# Patient Record
Sex: Male | Born: 1960 | Race: White | Hispanic: No | Marital: Single | Smoking: Former smoker
Health system: Southern US, Community
[De-identification: ages and names within clinical notes are randomized; demographics above are authoritative.]

## PROBLEM LIST (undated history)

## (undated) DIAGNOSIS — R0600 Dyspnea, unspecified: Secondary | ICD-10-CM

## (undated) DIAGNOSIS — F329 Major depressive disorder, single episode, unspecified: Secondary | ICD-10-CM

## (undated) DIAGNOSIS — H8110 Benign paroxysmal vertigo, unspecified ear: Secondary | ICD-10-CM

## (undated) DIAGNOSIS — I2510 Atherosclerotic heart disease of native coronary artery without angina pectoris: Secondary | ICD-10-CM

## (undated) DIAGNOSIS — I700 Atherosclerosis of aorta: Secondary | ICD-10-CM

## (undated) DIAGNOSIS — D3502 Benign neoplasm of left adrenal gland: Secondary | ICD-10-CM

## (undated) DIAGNOSIS — E109 Type 1 diabetes mellitus without complications: Secondary | ICD-10-CM

## (undated) DIAGNOSIS — E782 Mixed hyperlipidemia: Secondary | ICD-10-CM

## (undated) DIAGNOSIS — K824 Cholesterolosis of gallbladder: Secondary | ICD-10-CM

## (undated) DIAGNOSIS — J309 Allergic rhinitis, unspecified: Secondary | ICD-10-CM

## (undated) DIAGNOSIS — F411 Generalized anxiety disorder: Secondary | ICD-10-CM

## (undated) DIAGNOSIS — E1039 Type 1 diabetes mellitus with other diabetic ophthalmic complication: Secondary | ICD-10-CM

## (undated) DIAGNOSIS — F3289 Other specified depressive episodes: Secondary | ICD-10-CM

## (undated) DIAGNOSIS — K30 Functional dyspepsia: Secondary | ICD-10-CM

## (undated) DIAGNOSIS — J439 Emphysema, unspecified: Secondary | ICD-10-CM

## (undated) DIAGNOSIS — N1370 Vesicoureteral-reflux, unspecified: Secondary | ICD-10-CM

## (undated) DIAGNOSIS — Z87442 Personal history of urinary calculi: Secondary | ICD-10-CM

## (undated) DIAGNOSIS — D1803 Hemangioma of intra-abdominal structures: Secondary | ICD-10-CM

## (undated) DIAGNOSIS — N529 Male erectile dysfunction, unspecified: Secondary | ICD-10-CM

## (undated) DIAGNOSIS — C61 Malignant neoplasm of prostate: Secondary | ICD-10-CM

## (undated) DIAGNOSIS — E11329 Type 2 diabetes mellitus with mild nonproliferative diabetic retinopathy without macular edema: Secondary | ICD-10-CM

## (undated) HISTORY — DX: Generalized anxiety disorder: F41.1

## (undated) HISTORY — DX: Allergic rhinitis, unspecified: J30.9

## (undated) HISTORY — DX: Other specified depressive episodes: F32.89

## (undated) HISTORY — PX: TONSILLECTOMY: SUR1361

## (undated) HISTORY — DX: Major depressive disorder, single episode, unspecified: F32.9

## (undated) HISTORY — DX: Type 1 diabetes mellitus with other diabetic ophthalmic complication: E10.39

## (undated) HISTORY — DX: Benign paroxysmal vertigo, unspecified ear: H81.10

## (undated) HISTORY — DX: Mixed hyperlipidemia: E78.2

## (undated) HISTORY — PX: PROSTATE BIOPSY: SHX241

## (undated) HISTORY — PX: WISDOM TOOTH EXTRACTION: SHX21

## (undated) HISTORY — DX: Vesicoureteral-reflux, unspecified: N13.70

## (undated) HISTORY — DX: Type 1 diabetes mellitus without complications: E10.9

## (undated) HISTORY — DX: Type 2 diabetes mellitus with mild nonproliferative diabetic retinopathy without macular edema: E11.329

## (undated) HISTORY — PX: OTHER SURGICAL HISTORY: SHX169

---

## 1997-09-10 ENCOUNTER — Emergency Department (HOSPITAL_COMMUNITY): Admission: EM | Admit: 1997-09-10 | Discharge: 1997-09-10 | Payer: Self-pay | Admitting: Emergency Medicine

## 1999-11-27 ENCOUNTER — Emergency Department (HOSPITAL_COMMUNITY): Admission: EM | Admit: 1999-11-27 | Discharge: 1999-11-27 | Payer: Self-pay | Admitting: *Deleted

## 1999-11-27 ENCOUNTER — Encounter: Payer: Self-pay | Admitting: Emergency Medicine

## 2003-03-19 ENCOUNTER — Ambulatory Visit (HOSPITAL_COMMUNITY): Admission: RE | Admit: 2003-03-19 | Discharge: 2003-03-19 | Payer: Self-pay | Admitting: Orthopedic Surgery

## 2003-03-19 ENCOUNTER — Ambulatory Visit (HOSPITAL_BASED_OUTPATIENT_CLINIC_OR_DEPARTMENT_OTHER): Admission: RE | Admit: 2003-03-19 | Discharge: 2003-03-19 | Payer: Self-pay | Admitting: Orthopedic Surgery

## 2011-04-11 DIAGNOSIS — H3530 Unspecified macular degeneration: Secondary | ICD-10-CM | POA: Insufficient documentation

## 2011-10-10 DIAGNOSIS — E103293 Type 1 diabetes mellitus with mild nonproliferative diabetic retinopathy without macular edema, bilateral: Secondary | ICD-10-CM | POA: Insufficient documentation

## 2012-05-03 DIAGNOSIS — D3130 Benign neoplasm of unspecified choroid: Secondary | ICD-10-CM | POA: Insufficient documentation

## 2013-10-14 ENCOUNTER — Ambulatory Visit
Admission: RE | Admit: 2013-10-14 | Discharge: 2013-10-14 | Disposition: A | Discharge status: Home / Self Care | Payer: BC Managed Care – PPO | Source: Ambulatory Visit | Attending: Family Medicine | Admitting: Family Medicine

## 2013-10-14 ENCOUNTER — Other Ambulatory Visit: Payer: Self-pay | Admitting: Family Medicine

## 2013-10-14 DIAGNOSIS — E119 Type 2 diabetes mellitus without complications: Secondary | ICD-10-CM

## 2013-10-14 DIAGNOSIS — E785 Hyperlipidemia, unspecified: Secondary | ICD-10-CM

## 2013-11-17 ENCOUNTER — Encounter: Payer: Self-pay | Admitting: *Deleted

## 2013-11-17 ENCOUNTER — Ambulatory Visit (INDEPENDENT_AMBULATORY_CARE_PROVIDER_SITE_OTHER): Payer: BC Managed Care – PPO | Admitting: Cardiology

## 2013-11-17 ENCOUNTER — Encounter: Payer: Self-pay | Admitting: Cardiology

## 2013-11-17 VITALS — BP 120/60 | HR 63 | Ht 71.0 in | Wt 171.0 lb

## 2013-11-17 DIAGNOSIS — R0609 Other forms of dyspnea: Secondary | ICD-10-CM

## 2013-11-17 DIAGNOSIS — I10 Essential (primary) hypertension: Secondary | ICD-10-CM

## 2013-11-17 DIAGNOSIS — R0989 Other specified symptoms and signs involving the circulatory and respiratory systems: Secondary | ICD-10-CM

## 2013-11-17 DIAGNOSIS — E119 Type 2 diabetes mellitus without complications: Secondary | ICD-10-CM

## 2013-11-17 DIAGNOSIS — R0600 Dyspnea, unspecified: Secondary | ICD-10-CM

## 2013-11-17 DIAGNOSIS — I25810 Atherosclerosis of coronary artery bypass graft(s) without angina pectoris: Secondary | ICD-10-CM

## 2013-11-17 DIAGNOSIS — Z794 Long term (current) use of insulin: Secondary | ICD-10-CM

## 2013-11-17 DIAGNOSIS — IMO0001 Reserved for inherently not codable concepts without codable children: Secondary | ICD-10-CM

## 2013-11-17 DIAGNOSIS — E785 Hyperlipidemia, unspecified: Secondary | ICD-10-CM

## 2013-11-17 NOTE — Progress Notes (Signed)
Patient ID: DAMMON MAKAREWICZ, male   DOB: 04-Dec-1960, 53 y.o.   MRN: [id number]     Patient Name: Jose Harvey Date of Encounter: 11/17/2013  Primary Care Provider:  Antony Blackbird, MD Primary Cardiologist:  Dorothy Spark   Problem List   Past Medical History  Diagnosis Date  . Type I (juvenile type) diabetes mellitus without mention of complication, not stated as uncontrolled   . Mixed hyperlipidemia   . Depressive disorder, not elsewhere classified   . BPV (benign positional vertigo)   . GERD (gastroesophageal reflux disease)   . Vesicoureteral reflux   . Anxiety state, unspecified   . Allergic rhinitis, cause unspecified   . 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  Allergies  Allergies  Allergen Reactions  . Other Other (See Comments)    Any antihistamines Causes shaking  . Zocor [Simvastatin] Other (See Comments)    Muscle aches     HPI  A pleasant 53 year old gentleman with prior medical history of insulin-dependent diabetes mellitus diagnosed at age 68 currently on insulin pump, hypertension, hyperlipidemia who is coming with concern of exertion all dyspnea that he has noticed in the last year. On one occasion he was helping his son in law pulling at the year and he became significantly short of breath that persisted for a while. He has also noticed that activities that used to be easy making now short of breath. He bought a treadmill and starting February he has been exercising almost daily with some improvement in dyspnea on exertion. He denies any chest pain, jaw pain back pain or arm pain. He had 2 occasions when he woke up in the middle of the night feeling short of breath and sitting up would help. He denies lower extremity edema and states that his left legs gets tired after walking longer distances. The  patient quit smoking 5 years ago. He denies any palpitations or syncope. He has significant family history of premature coronary artery disease, his father had a coronary artery bypass graft surgery and passed away from a myocardial infarction at age of 55.   Home Medications  Prior to Admission medications   Medication Sig Start Date End Date Taking? Authorizing Provider  aspirin 81 MG tablet Take 81 mg by mouth daily.   Yes Historical Provider, MD  glucagon (GLUCAGON EMERGENCY) 1 MG injection Inject 1 mg into the vein once as needed.   Yes Historical Provider, MD  insulin aspart (NOVOLOG) 100 UNIT/ML injection Inject into the skin 3 (three) times daily before meals. Sliding scale   Yes Historical Provider, MD  KRILL OIL PO Take by mouth daily.   Yes Historical Provider, MD  lisinopril (PRINIVIL,ZESTRIL) 5 MG tablet Take 1 tablet by mouth daily. 09/09/13  Yes Historical Provider, MD  pantoprazole (PROTONIX) 40 MG tablet Take 1 tablet by mouth daily. 10/13/13  Yes Historical Provider, MD  sertraline (ZOLOFT) 50 MG tablet Take 50 mg by mouth daily.   Yes Historical Provider, MD    Family History  Family History  Problem Relation Age of Onset  . Diabetes type I Father   . Heart attack Father   . Hypertension Father   . Emphysema Father   . CAD Father   . Emphysema Paternal Grandfather   . Diabetes type I Paternal Grandfather   . Colon cancer Maternal Grandfather   .  Kidney disease Neg Hx   . Liver disease Neg Hx     Social History  History   Social History  . Marital Status: Married    Spouse Name: N/A    Number of Children: N/A  . Years of Education: N/A   Occupational History  . Not on file.   Social History Main Topics  . Smoking status: Former Research scientist (life sciences)  . Smokeless tobacco: Never Used     Comment: quit 2010  . Alcohol Use: 3.6 oz/week    6 Cans of beer per week     Comment: occasional 6pk per week  . Drug Use: Not on file  . Sexual Activity: Not on file   Other  Topics Concern  . Not on file   Social History Narrative  . No narrative on file     Review of Systems, as per HPI, otherwise negative General:  No chills, fever, night sweats or weight changes.  Cardiovascular:  No chest pain, dyspnea on exertion, edema, orthopnea, palpitations, paroxysmal nocturnal dyspnea. Dermatological: No rash, lesions/masses Respiratory: No cough, dyspnea Urologic: No hematuria, dysuria Abdominal:   No nausea, vomiting, diarrhea, bright red blood per rectum, melena, or hematemesis Neurologic:  No visual changes, wkns, changes in mental status. All other systems reviewed and are otherwise negative except as noted above.  Physical Exam  Blood pressure 120/60, pulse 63, height 5\' 11"  (1.803 m), weight 171 lb (77.565 kg).  General: Pleasant, NAD Psych: Normal affect. Neuro: Alert and oriented X 3. Moves all extremities spontaneously. HEENT: Normal  Neck: Supple without bruits or JVD. Lungs:  Resp regular and unlabored, CTA. Heart: RRR no s3, s4, or murmurs. Abdomen: Soft, non-tender, non-distended, BS + x 4.  Extremities: No clubbing, cyanosis or edema. DP/PT/Radials 2+ and equal bilaterally.  Labs: Triglycerides 90, HDL 52, LDL 149, hemoglobin A1c 7.3%, glucose 162, creatinine 0.9, potassium 4.1, AST 20, ALT 23,  Accessory Clinical Findings  Echocardiogram - none  ECG - normal sinus rhythm, 63 beats per minute, normal EKG    Assessment & Plan  53 year old gentleman with type 1 diabetes on insulin pump, hypertension hyperlipidemia and family history of premature coronary artery disease who is symptomatic with   1. dyspnea on exertion and paroxysmal nocturnal dyspnea. We will order a stress echocardiogram to evaluate for left ventricular function and rule out ischemia.  2. Hyperlipidemia - LDL 149, goal less than 70, patient significantly intolerance to Lipitor, Crestor and Zocor with prior arm joint pain, blisters. We will refer the patient to our  lipid clinic for further management. He might be enrolled in PCSK-9 inhibitors  3. HTN  - controlled  4. insulin-dependent diabetes mellitus - since age of 23 currently on insulin pump, hemoglobin A1c 7.3%, therefore we need to be aggressive with cholesterol and blood pressure management to avoid any atherosclerosis.  Followup in 2 months  Dorothy Spark, MD, [facility] 11/17/2013, 10:12 AM

## 2013-11-17 NOTE — Patient Instructions (Signed)
Your physician recommends that you continue on your current medications as directed. Please refer to the Current Medication list given to you today.  Your physician has requested that you have a stress echocardiogram. For further information please visit [email or web address]. Please follow instruction sheet as given.  Your physician recommends that you schedule a follow-up appointment in: 2 months.

## 2013-11-27 ENCOUNTER — Ambulatory Visit (INDEPENDENT_AMBULATORY_CARE_PROVIDER_SITE_OTHER): Payer: BC Managed Care – PPO | Admitting: Pharmacist

## 2013-11-27 ENCOUNTER — Institutional Professional Consult (permissible substitution): Payer: BC Managed Care – PPO | Admitting: Pharmacist

## 2013-11-27 VITALS — Wt 171.0 lb

## 2013-11-27 DIAGNOSIS — Z79899 Other long term (current) drug therapy: Secondary | ICD-10-CM

## 2013-11-27 DIAGNOSIS — E785 Hyperlipidemia, unspecified: Secondary | ICD-10-CM

## 2013-11-27 MED ORDER — PITAVASTATIN CALCIUM 2 MG PO TABS: 2.0000 mg | ORAL_TABLET | ORAL | Status: DC

## 2013-11-27 NOTE — Progress Notes (Signed)
Patient is a pleasant 53 y.o. WM referred to lipid clinic by Dr. Meda Coffee given h/o Diabetes Type I and statin intolerance.  His last LDL was 149 mg/dL with his PCP in 09/2013.  Patient tells me he has failed simvastatin, atorvastatin, and Crestor in the past due to muscle aches. One of these statins apparently caused some bumps to form on his tongue, but he doesn't recall which one. It didn't cause any swelling of mouth, nor affect this breathing.  Patient hasn't taken a statin in over 5 years he tells me. Dr. Meda Coffee wanted to know if he was eligible was PCSK-9 inhibitor therapy.  Patient has a h/o Type I Diabetes, HTN, and family h/o CAD (his father had an MI at 69 y.o.).  He doesn't meet criteria for SPIRE currently.  HDL normal, renal function normal.    Patient recently bought a treadmill and is now walking 8-10 miles per week.  He was diagnosed with diabetes at age 18, and has avoided soda and desserts since that time.  His diet typically consists of a biscuit in the morning, eats at the cafeteria for lunch, and has a microwave meal typically in the evening.  He is single so doesn't do a lot of cooking.  He sometimes eats a pack of crackers or almonds for a snack.  He drinks 3-4 beers per week.  Is a former smoker.  RF:  Type I Diabetes, HTN, and family h/o CAD (his father had an MI at 51 y.o.) - LDL goal < 70, non-HDL goal < 100  Meds:  Not on lipid lowering meds currently. Intolerant:  simvastatin, atorvastatin, and Crestor in the past due to muscle aches.  One of these also caused bumps on his tongue (doesn't remember which one).  Labs: 09/2013:  A1C 7.2, TC 219, TG 90, LDL 149, non-HDL 167, HDL 52, LFTs normal (not on lipid lowering meds).  Current Outpatient Prescriptions  Medication Sig Dispense Refill  . aspirin 81 MG tablet Take 81 mg by mouth daily.      Marland Kitchen glucagon (GLUCAGON EMERGENCY) 1 MG injection Inject 1 mg into the vein once as needed.      . insulin aspart (NOVOLOG) 100 UNIT/ML  injection Inject into the skin 3 (three) times daily before meals. Sliding scale      . KRILL OIL PO Take by mouth daily.      Marland Kitchen lisinopril (PRINIVIL,ZESTRIL) 5 MG tablet Take 1 tablet by mouth daily.      . pantoprazole (PROTONIX) 40 MG tablet Take 1 tablet by mouth daily.      . sertraline (ZOLOFT) 50 MG tablet Take 50 mg by mouth daily.       No current facility-administered medications for this visit.   Allergies  Allergen Reactions  . Crestor [Rosuvastatin]     Muscle aches  . Lipitor [Atorvastatin]     Muscle aches  . Other Other (See Comments)    Any antihistamines Causes shaking  . Zocor [Simvastatin] Other (See Comments)    Muscle aches    Family History  Problem Relation Age of Onset  . Diabetes type I Father   . Heart attack Father   . Hypertension Father   . Emphysema Father   . CAD Father   . Emphysema Paternal Grandfather   . Diabetes type I Paternal Grandfather   . Colon cancer Maternal Grandfather   . Kidney disease Neg Hx   . Liver disease Neg Hx

## 2013-11-27 NOTE — Assessment & Plan Note (Addendum)
Patient needs approximately a 50% LDL reduction if possible.  He doesn't qualify for PCSK-9 inhibitor clinical trial, and these drugs won't be available for another few months (and unknown cost / indication).  Would prefer to not use Zetia given lack of data / potency in monotherapy.  Discussed Livalo at low dose in patient given it is very well tolerated, and typically covered well with private insurance (he has [employer]).  Will have him start with Livalo 1 mg qd, and increase to 2 mg qd in 4 weeks.  Gave patient samples to last until he comes back in 2 months, then will send in a prescription if he is tolerating at our f/u visit.  Gave coupon voucher as well.  Will start eating a handful of almonds daily for LDL lowering potential. Plan: 1.  Start Livalo 2 mg pills - take 1/2 tablet daily for 4 weeks, then increase to 1 tablet daily thereafter.  Take it in the PM. 2.  Eat a handful of almonds daily. 3.  Recheck cholesterol / liver in 2-3 months (01/27/14 - fasting lab work only, show up anytime after 7:30 am) , see Ysidro Evert a few days later on 01/29/14 the same day you see Dr. Meda Coffee (3:30 Ysidro Evert, 4:00 Dr. Meda Coffee)

## 2013-11-27 NOTE — Patient Instructions (Signed)
Plan: 1.  Start Livalo 2 mg pills - take 1/2 tablet daily for 4 weeks, then increase to 1 tablet daily thereafter.  Take it in the PM. 2.  Eat a handful of almonds daily. 3.  Recheck cholesterol / liver in 2-3 months (01/27/14 - fasting lab work only, show up anytime after 7:30 am) , see Ysidro Evert a few days later on 01/29/14 the same day you see Dr. Meda Coffee (3:30 Ysidro Evert, 4:00 Dr. Meda Coffee)

## 2013-12-15 ENCOUNTER — Other Ambulatory Visit (HOSPITAL_COMMUNITY): Payer: BC Managed Care – PPO

## 2013-12-26 ENCOUNTER — Ambulatory Visit (HOSPITAL_COMMUNITY): Payer: BC Managed Care – PPO | Attending: Internal Medicine

## 2013-12-26 DIAGNOSIS — R0989 Other specified symptoms and signs involving the circulatory and respiratory systems: Secondary | ICD-10-CM | POA: Insufficient documentation

## 2013-12-26 DIAGNOSIS — R0602 Shortness of breath: Secondary | ICD-10-CM

## 2013-12-26 DIAGNOSIS — R0609 Other forms of dyspnea: Secondary | ICD-10-CM

## 2013-12-26 DIAGNOSIS — R0600 Dyspnea, unspecified: Secondary | ICD-10-CM

## 2013-12-26 NOTE — Progress Notes (Signed)
Echo performed. 

## 2014-01-27 ENCOUNTER — Other Ambulatory Visit (INDEPENDENT_AMBULATORY_CARE_PROVIDER_SITE_OTHER): Payer: BC Managed Care – PPO

## 2014-01-27 DIAGNOSIS — Z79899 Other long term (current) drug therapy: Secondary | ICD-10-CM

## 2014-01-27 DIAGNOSIS — E785 Hyperlipidemia, unspecified: Secondary | ICD-10-CM

## 2014-01-27 LAB — HEPATIC FUNCTION PANEL
ALT: 26 U/L (ref 0–53)
AST: 21 U/L (ref 0–37)
Albumin: 3.9 g/dL (ref 3.5–5.2)
Alkaline Phosphatase: 56 U/L (ref 39–117)
Bilirubin, Direct: 0.1 mg/dL (ref 0.0–0.3)
Total Bilirubin: 0.6 mg/dL (ref 0.2–1.2)
Total Protein: 7 g/dL (ref 6.0–8.3)

## 2014-01-27 LAB — LIPID PANEL
Cholesterol: 171 mg/dL (ref 0–200)
HDL: 45.4 mg/dL (ref >39.00)
LDL Cholesterol: 116 mg/dL — ABNORMAL HIGH (ref 0–99)
NonHDL: 125.6
Total CHOL/HDL Ratio: 4
Triglycerides: 47 mg/dL (ref 0.0–149.0)
VLDL: 9.4 mg/dL (ref 0.0–40.0)

## 2014-01-29 ENCOUNTER — Encounter: Payer: Self-pay | Admitting: Cardiology

## 2014-01-29 ENCOUNTER — Ambulatory Visit (INDEPENDENT_AMBULATORY_CARE_PROVIDER_SITE_OTHER): Payer: BC Managed Care – PPO | Admitting: Cardiology

## 2014-01-29 ENCOUNTER — Ambulatory Visit (INDEPENDENT_AMBULATORY_CARE_PROVIDER_SITE_OTHER): Payer: BC Managed Care – PPO | Admitting: Pharmacist

## 2014-01-29 VITALS — BP 126/70 | HR 75 | Ht 71.0 in | Wt 170.0 lb

## 2014-01-29 VITALS — Wt 170.0 lb

## 2014-01-29 DIAGNOSIS — E785 Hyperlipidemia, unspecified: Secondary | ICD-10-CM

## 2014-01-29 DIAGNOSIS — R0989 Other specified symptoms and signs involving the circulatory and respiratory systems: Secondary | ICD-10-CM

## 2014-01-29 DIAGNOSIS — H9209 Otalgia, unspecified ear: Secondary | ICD-10-CM

## 2014-01-29 DIAGNOSIS — Z79899 Other long term (current) drug therapy: Secondary | ICD-10-CM

## 2014-01-29 DIAGNOSIS — R6889 Other general symptoms and signs: Secondary | ICD-10-CM

## 2014-01-29 DIAGNOSIS — J449 Chronic obstructive pulmonary disease, unspecified: Secondary | ICD-10-CM

## 2014-01-29 MED ORDER — TIOTROPIUM BROMIDE MONOHYDRATE 18 MCG IN CAPS: 18.0000 ug | ORAL_CAPSULE | Freq: Every day | RESPIRATORY_TRACT | Status: DC

## 2014-01-29 MED ORDER — PITAVASTATIN CALCIUM 2 MG PO TABS: 2.0000 mg | ORAL_TABLET | Freq: Every day | ORAL | Status: DC

## 2014-01-29 MED ORDER — EZETIMIBE 10 MG PO TABS: 10.0000 mg | ORAL_TABLET | Freq: Every day | ORAL | Status: DC

## 2014-01-29 NOTE — Patient Instructions (Signed)
Your physician has recommended you make the following change in your medication:   START USING [place] have been referred to AN ENT FOR EAR PAIN AND THROAT FULLNESS  Your physician wants you to follow-up in: [place] will receive a reminder letter in the mail two months in advance. If you don't receive a letter, please call our office to schedule the follow-up appointment.

## 2014-01-29 NOTE — Progress Notes (Signed)
Patient is a pleasant 53 y.o. WM referred to lipid clinic by Dr. Meda Coffee given h/o Diabetes Type I and statin intolerance.  He was started on Livalo 1 mg and titrated up to 2 mg back in 11/2013.  His last LDL was 149 mg/dL before adding Livalo, and now down to 116 mg/dL on Livalo 2 mg qd.  He has some mild aches on Livalo, but nothing significant.  Patient tells me he has failed simvastatin, atorvastatin, and Crestor in the past due to severe muscle aches. One of these statins apparently caused some bumps to form on his tongue, but he doesn't recall which one. It didn't cause any swelling of mouth, nor affect this breathing.   Dr. Meda Coffee wanted to know if he was eligible was PCSK-9 inhibitor therapy.  Patient has a h/o Type I Diabetes, HTN, and family h/o CAD (his father had an MI at 15 y.o.).  He doesn't meet criteria for SPIRE currently.  HDL normal, renal function normal.  Praluent not appropriate at this time either.  Patient recently bought a treadmill and is now walking 8-10 miles per week.  He was diagnosed with diabetes at age 42, and has avoided soda and desserts since that time.  His diet typically consists of a biscuit in the morning, eats at the cafeteria for lunch, and has a microwave meal typically in the evening.  He is single so doesn't do a lot of cooking.  He sometimes eats a pack of crackers or almonds for a snack.  He drinks 3-4 beers per week.  Is a former smoker.  RF:  Type I Diabetes, HTN, and family h/o CAD (his father had an MI at 65 y.o.) - LDL goal < 70, non-HDL goal < 100  Meds: Livalo 2 mg qd Intolerant:  simvastatin, atorvastatin, and Crestor in the past due to muscle aches.  One of these also caused bumps on his tongue (doesn't remember which one).  Labs: 01/2014:  TC 171, TG 47, LDL 116, HDL 45, LFTs normal (Livalo 2 mg qd) 09/2013:  A1C 7.2, TC 219, TG 90, LDL 149, non-HDL 167, HDL 52, LFTs normal (not on lipid lowering meds).  Current Outpatient Prescriptions  Medication  Sig Dispense Refill  . aspirin 81 MG tablet Take 81 mg by mouth daily.      Marland Kitchen glucagon (GLUCAGON EMERGENCY) 1 MG injection Inject 1 mg into the vein once as needed.      . insulin aspart (NOVOLOG) 100 UNIT/ML injection Inject into the skin 3 (three) times daily before meals. Sliding scale      . KRILL OIL PO Take by mouth daily.      Marland Kitchen lisinopril (PRINIVIL,ZESTRIL) 5 MG tablet Take 1 tablet by mouth daily.      . pantoprazole (PROTONIX) 40 MG tablet Take 1 tablet by mouth daily.      . Pitavastatin Calcium (LIVALO) 2 MG TABS Take 1 tablet (2 mg total) by mouth 30 (thirty) minutes before procedure.  30 tablet    . sertraline (ZOLOFT) 50 MG tablet Take 50 mg by mouth daily.       No current facility-administered medications for this visit.   Allergies  Allergen Reactions  . Crestor [Rosuvastatin]     Muscle aches  . Lipitor [Atorvastatin]     Muscle aches  . Other Other (See Comments)    Any antihistamines Causes shaking  . Zocor [Simvastatin] Other (See Comments)    Muscle aches    Family History  Problem  Relation Age of Onset  . Diabetes type I Father   . Heart attack Father   . Hypertension Father   . Emphysema Father   . CAD Father   . Emphysema Paternal Grandfather   . Diabetes type I Paternal Grandfather   . Colon cancer Maternal Grandfather   . Kidney disease Neg Hx   . Liver disease Neg Hx

## 2014-01-29 NOTE — Assessment & Plan Note (Signed)
Cholesterol has significantly improved with Livalo 2 mg qd, however not as much as I would have expected.  He tells me he has been eating out more frequently, and this may be reason he has a blunted response to Livalo.  Will add Zetia 10 mg qd to regimen instead of titrating Livalo to 4 mg as he is already having some soreness.  Recheck lipid / liver in 3 months and see me soon after.

## 2014-01-29 NOTE — Patient Instructions (Signed)
1.  Add Zetia 10 mg daily to your regimen.  If you get sore, cut it in half. 2.  Continue Livalo 2 mg daily. 3.  Recheck cholesterol / liver in 3 months (05/05/14 - fasting labs - lab opens at 7:30 am), and see Valli Randol 2 days later on 05/07/14 at 3:30 pm

## 2014-01-29 NOTE — Progress Notes (Signed)
Patient ID: Jose Harvey, male   DOB: Aug 28, 1960, 53 y.o.   MRN: [id number]    Patient Name: Jose Harvey Date of Encounter: 01/29/2014  Primary Care Provider:  Antony Blackbird, MD Primary Cardiologist:  Jose Harvey   Problem List   Past Medical History  Diagnosis Date  . Type I (juvenile type) diabetes mellitus without mention of complication, not stated as uncontrolled   . Mixed hyperlipidemia   . Depressive disorder, not elsewhere classified   . BPV (benign positional vertigo)   . GERD (gastroesophageal reflux disease)   . Vesicoureteral reflux   . Anxiety state, unspecified   . Allergic rhinitis, cause unspecified   . 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  Allergies  Allergies  Allergen Reactions  . Crestor [Rosuvastatin]     Muscle aches  . Lipitor [Atorvastatin]     Muscle aches  . Other Other (See Comments)    Any antihistamines Causes shaking  . Zocor [Simvastatin] Other (See Comments)    Muscle aches     HPI  A pleasant 53 year old gentleman with prior medical history of insulin-dependent diabetes mellitus diagnosed at age 61 currently on insulin pump, hypertension, hyperlipidemia who is coming with concern of exertion all dyspnea that he has noticed in the last year. On one occasion he was helping his son in law pulling at the year and he became significantly short of breath that persisted for a while. He has also noticed that activities that used to be easy making now short of breath. He bought a treadmill and starting February he has been exercising almost daily with some improvement in dyspnea on exertion. He denies any chest pain, jaw pain back pain or arm pain. He had 2 occasions when he woke up in the middle of the night feeling short of breath and sitting up would help. He denies lower extremity  edema and states that his left legs gets tired after walking longer distances. The patient quit smoking 5 years ago. He denies any palpitations or syncope. He has significant family history of premature coronary artery disease, his father had a coronary artery bypass graft surgery and passed away from a myocardial infarction at age of 16.  01/29/14 - the patient is feeling much better, he started to exercise - walking  [place] daily on his treadmill. He has no SOB on exertion but feels some heaviness at rest. He is a life long smoker. He is also complaining of throat fullness, ear pain and no difficulty swallowing.   Home Medications  Prior to Admission medications   Medication Sig Start Date End Date Taking? Authorizing Provider  aspirin 81 MG tablet Take 81 mg by mouth daily.   Yes Historical Provider, MD  glucagon (GLUCAGON EMERGENCY) 1 MG injection Inject 1 mg into the vein once as needed.   Yes Historical Provider, MD  insulin aspart (NOVOLOG) 100 UNIT/ML injection Inject into the skin 3 (three) times daily before meals. Sliding scale   Yes Historical Provider, MD  KRILL OIL PO Take by mouth daily.   Yes Historical Provider, MD  lisinopril (PRINIVIL,ZESTRIL) 5 MG tablet Take 1 tablet by mouth daily. 09/09/13  Yes Historical Provider, MD  pantoprazole (PROTONIX) 40 MG tablet Take 1 tablet by mouth daily. 10/13/13  Yes Historical Provider, MD  sertraline (ZOLOFT) 50 MG tablet Take 50 mg by mouth daily.  Yes Historical Provider, MD    Family History  Family History  Problem Relation Age of Onset  . Diabetes type I Father   . Heart attack Father   . Hypertension Father   . Emphysema Father   . CAD Father   . Emphysema Paternal Grandfather   . Diabetes type I Paternal Grandfather   . Colon cancer Maternal Grandfather   . Kidney disease Neg Hx   . Liver disease Neg Hx     Social History  History   Social History  . Marital Status: Married    Spouse Name: N/A    Number of Children:  N/A  . Years of Education: N/A   Occupational History  . Not on file.   Social History Main Topics  . Smoking status: Former Research scientist (life sciences)  . Smokeless tobacco: Never Used     Comment: quit 2010  . Alcohol Use: 3.6 oz/week    6 Cans of beer per week     Comment: occasional 6pk per week  . Drug Use: Not on file  . Sexual Activity: Not on file   Other Topics Concern  . Not on file   Social History Narrative  . No narrative on file     Review of Systems, as per HPI, otherwise negative General:  No chills, fever, night sweats or weight changes.  Cardiovascular:  No chest pain, dyspnea on exertion, edema, orthopnea, palpitations, paroxysmal nocturnal dyspnea. Dermatological: No rash, lesions/masses Respiratory: No cough, dyspnea Urologic: No hematuria, dysuria Abdominal:   No nausea, vomiting, diarrhea, bright red blood per rectum, melena, or hematemesis Neurologic:  No visual changes, wkns, changes in mental status. All other systems reviewed and are otherwise negative except as noted above.  Physical Exam  Blood pressure 126/70, pulse 75, height 5\' 11"  (1.803 m), weight 170 lb (77.111 kg), SpO2 99.00%.  General: Pleasant, NAD Psych: Normal affect. Neuro: Alert and oriented X 3. Moves all extremities spontaneously. HEENT: Normal  Neck: Supple without bruits or JVD. Lungs:  Resp regular and unlabored, CTA. Heart: RRR no s3, s4, or murmurs. Abdomen: Soft, non-tender, non-distended, BS + x 4.  Extremities: No clubbing, cyanosis or edema. DP/PT/Radials 2+ and equal bilaterally.  Labs: Triglycerides 90, HDL 52, LDL 149, hemoglobin A1c 7.3%, glucose 162, creatinine 0.9, potassium 4.1, AST 20, ALT 23,  Accessory Clinical Findings  Stress Echocardiogram - 12/26/2013 - Stress ECG conclusions: There were no stress arrhythmias or conduction abnormalities. The stress ECG was normal. - Staged echo: There was no echocardiographic evidence for stress-induced ischemia.  Impressions: -  Stress echocardiogram with no chest pain, no ST changes and no stress-induced wall motion abnormalites; normal stress echo.  ECG - normal sinus rhythm, 63 beats per minute, normal EKG    Assessment & Plan  53 year old gentleman with type 1 diabetes on insulin pump, hypertension hyperlipidemia and family history of premature coronary artery disease who is symptomatic with   1. Dyspnea on exertion -  Normal LVEF, normal hyperdynamic response to stress. Appears euvolemic. He has h/o COPD on no therapy - we will start Spiriva 10 mg daily    2. Hyperlipidemia - LDL 149, goal less than 70, patient significantly intolerance to Lipitor, Crestor and Zocor with prior arm joint pain, blisters. Started on Livalo by Dr Rockney Ghee, added Zetia today.  3. HTN  - controlled  4. Insulin-dependent diabetes mellitus - since age of 22 currently on insulin pump, hemoglobin A1c 7.3%, therefore we need to be aggressive with cholesterol and blood pressure  management to avoid any atherosclerosis.  5. COPD - start Spiriva  6. Throat fullness, ear pain - ENT referral  Followup in 6 months  Azlyn Wingler, Jamse [place], MD, [facility] 01/29/2014, 4:22 PM

## 2014-01-30 DIAGNOSIS — R6889 Other general symptoms and signs: Secondary | ICD-10-CM | POA: Insufficient documentation

## 2014-01-30 DIAGNOSIS — J449 Chronic obstructive pulmonary disease, unspecified: Secondary | ICD-10-CM | POA: Insufficient documentation

## 2014-01-30 DIAGNOSIS — R0989 Other specified symptoms and signs involving the circulatory and respiratory systems: Secondary | ICD-10-CM | POA: Insufficient documentation

## 2014-02-03 ENCOUNTER — Telehealth: Payer: Self-pay | Admitting: *Deleted

## 2014-02-03 NOTE — Telephone Encounter (Signed)
Contacted pt to inform him that we left samples and a discount card at the front desk for pick-up, for medication pitavastatin (livalo) 2 mg.  Pt states he is out of town until next week, but will pick it up then on Tuesday.  Informed pt that would be fine, it will be waiting at the front desk for him.  Pt verbalized understanding and very gracious for the samples supplied.  This med is currently in the process of being prior authorized through [employer] mail.

## 2014-02-05 ENCOUNTER — Telehealth: Payer: Self-pay | Admitting: *Deleted

## 2014-02-05 NOTE — Telephone Encounter (Signed)
Pts prior authorization for med pitavastatin (livalo) sent to pts [facility] [place] office at [phone].

## 2014-05-04 ENCOUNTER — Other Ambulatory Visit (INDEPENDENT_AMBULATORY_CARE_PROVIDER_SITE_OTHER): Payer: BC Managed Care – PPO | Admitting: *Deleted

## 2014-05-04 DIAGNOSIS — E785 Hyperlipidemia, unspecified: Secondary | ICD-10-CM

## 2014-05-04 DIAGNOSIS — Z79899 Other long term (current) drug therapy: Secondary | ICD-10-CM

## 2014-05-04 LAB — HEPATIC FUNCTION PANEL
ALT: 26 U/L (ref 0–53)
AST: 27 U/L (ref 0–37)
Albumin: 3.8 g/dL (ref 3.5–5.2)
Alkaline Phosphatase: 62 U/L (ref 39–117)
Bilirubin, Direct: 0.1 mg/dL (ref 0.0–0.3)
Total Bilirubin: 0.5 mg/dL (ref 0.2–1.2)
Total Protein: 6.6 g/dL (ref 6.0–8.3)

## 2014-05-04 LAB — LIPID PANEL
Cholesterol: 120 mg/dL (ref 0–200)
HDL: 43.2 mg/dL (ref >39.00)
LDL Cholesterol: 69 mg/dL (ref 0–99)
NonHDL: 76.8
Total CHOL/HDL Ratio: 3
Triglycerides: 39 mg/dL (ref 0.0–149.0)
VLDL: 7.8 mg/dL (ref 0.0–40.0)

## 2014-05-05 ENCOUNTER — Other Ambulatory Visit: Payer: BC Managed Care – PPO

## 2014-05-07 ENCOUNTER — Ambulatory Visit: Payer: BC Managed Care – PPO | Admitting: Pharmacist

## 2014-07-28 ENCOUNTER — Encounter: Payer: Self-pay | Admitting: Cardiology

## 2014-07-28 ENCOUNTER — Ambulatory Visit (INDEPENDENT_AMBULATORY_CARE_PROVIDER_SITE_OTHER): Payer: BLUE CROSS/BLUE SHIELD | Admitting: Cardiology

## 2014-07-28 VITALS — BP 110/62 | HR 64 | Ht 71.0 in | Wt 175.0 lb

## 2014-07-28 DIAGNOSIS — R0600 Dyspnea, unspecified: Secondary | ICD-10-CM

## 2014-07-28 DIAGNOSIS — I10 Essential (primary) hypertension: Secondary | ICD-10-CM

## 2014-07-28 DIAGNOSIS — E785 Hyperlipidemia, unspecified: Secondary | ICD-10-CM

## 2014-07-28 DIAGNOSIS — R0609 Other forms of dyspnea: Secondary | ICD-10-CM

## 2014-07-28 NOTE — Patient Instructions (Signed)
Your physician recommends that you continue on your current medications as directed. Please refer to the Current Medication list given to you today.   Your physician wants you to follow-up in: ONE YEAR WITH DR NELSON You will receive a reminder letter in the mail two months in advance. If you don't receive a letter, please call our office to schedule the follow-up appointment.  

## 2014-07-28 NOTE — Progress Notes (Signed)
Patient ID: RAGE BEEVER, male   DOB: 1960/10/17, 54 y.o.   MRN: [id number]    Patient Name: Jose Harvey Date of Encounter: 07/28/2014  Primary Care Provider:  Antony Blackbird, MD Primary Cardiologist:  Dorothy Spark   Problem List   Past Medical History  Diagnosis Date  . Type I (juvenile type) diabetes mellitus without mention of complication, not stated as uncontrolled   . Mixed hyperlipidemia   . Depressive disorder, not elsewhere classified   . BPV (benign positional vertigo)   . GERD (gastroesophageal reflux disease)   . Vesicoureteral reflux   . Anxiety state, unspecified   . Allergic rhinitis, cause unspecified   . 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  Allergies  Allergies  Allergen Reactions  . Crestor [Rosuvastatin]     Muscle aches  . Lipitor [Atorvastatin]     Muscle aches  . Other Other (See Comments)    Any antihistamines Causes shaking  . Zocor [Simvastatin] Other (See Comments)    Muscle aches     HPI  A pleasant 54 year old gentleman with prior medical history of insulin-dependent diabetes mellitus diagnosed at age 83 currently on insulin pump, hypertension, hyperlipidemia who is coming with concern of exertion all dyspnea that he has noticed in the last year. On one occasion he was helping his son in law pulling at the year and he became significantly short of breath that persisted for a while. He has also noticed that activities that used to be easy making now short of breath. He bought a treadmill and starting February he has been exercising almost daily with some improvement in dyspnea on exertion. He denies any chest pain, jaw pain back pain or arm pain. He had 2 occasions when he woke up in the middle of the night feeling short of breath and sitting up would help. He denies lower extremity  edema and states that his left legs gets tired after walking longer distances. The patient quit smoking 5 years ago. He denies any palpitations or syncope. He has significant family history of premature coronary artery disease, his father had a coronary artery bypass graft surgery and passed away from a myocardial infarction at age of 23.  01/29/14 - the patient is feeling much better, he started to exercise - walking  [place] daily on his treadmill. He has no SOB on exertion but feels some heaviness at rest. He is a life long smoker. He is also complaining of throat fullness, ear pain and no difficulty swallowing.   05/28/2015 - the patient is coming after 6 months, in the meantime his percent was changed to Livalo 2 mg that significantly improved his LDL and decreased it from 116 to 69. The patient continues to have subjective bruit in his neck from his carotids however cardiac ultrasound last year showed less than 50% stenosis laterally. He continues to exercise on treadmill he walks or runs at least a mile every day without significant shortness of breath. He denies any chest pain. He would get short of breath on moderate exertion.  Home Medications  Prior to Admission medications   Medication Sig Start Date End Date Taking? Authorizing Provider  aspirin 81 MG tablet Take 81 mg by mouth daily.   Yes Historical Provider, MD  glucagon (GLUCAGON EMERGENCY) 1 MG injection Inject 1 mg into the vein once as needed.  Yes Historical Provider, MD  insulin aspart (NOVOLOG) 100 UNIT/ML injection Inject into the skin 3 (three) times daily before meals. Sliding scale   Yes Historical Provider, MD  KRILL OIL PO Take by mouth daily.   Yes Historical Provider, MD  lisinopril (PRINIVIL,ZESTRIL) 5 MG tablet Take 1 tablet by mouth daily. 09/09/13  Yes Historical Provider, MD  pantoprazole (PROTONIX) 40 MG tablet Take 1 tablet by mouth daily. 10/13/13  Yes Historical Provider, MD  sertraline (ZOLOFT) 50 MG tablet Take  50 mg by mouth daily.   Yes Historical Provider, MD    Family History  Family History  Problem Relation Age of Onset  . Diabetes type I Father   . Heart attack Father   . Hypertension Father   . Emphysema Father   . CAD Father   . Emphysema Paternal Grandfather   . Diabetes type I Paternal Grandfather   . Colon cancer Maternal Grandfather   . Kidney disease Neg Hx   . Liver disease Neg Hx     Social History  History   Social History  . Marital Status: Married    Spouse Name: N/A  . Number of Children: N/A  . Years of Education: N/A   Occupational History  . Not on file.   Social History Main Topics  . Smoking status: Former Research scientist (life sciences)  . Smokeless tobacco: Never Used     Comment: quit 2010  . Alcohol Use: 3.6 oz/week    6 Cans of beer per week     Comment: occasional 6pk per week  . Drug Use: Not on file  . Sexual Activity: Not on file   Other Topics Concern  . Not on file   Social History Narrative     Review of Systems, as per HPI, otherwise negative General:  No chills, fever, night sweats or weight changes.  Cardiovascular:  No chest pain, dyspnea on exertion, edema, orthopnea, palpitations, paroxysmal nocturnal dyspnea. Dermatological: No rash, lesions/masses Respiratory: No cough, dyspnea Urologic: No hematuria, dysuria Abdominal:   No nausea, vomiting, diarrhea, bright red blood per rectum, melena, or hematemesis Neurologic:  No visual changes, wkns, changes in mental status. All other systems reviewed and are otherwise negative except as noted above.  Physical Exam  Blood pressure 110/62, pulse 64, height 5\' 11"  (1.803 m), weight 175 lb (79.379 kg), SpO2 98 %.  General: Pleasant, NAD Psych: Normal affect. Neuro: Alert and oriented X 3. Moves all extremities spontaneously. HEENT: Normal  Neck: Supple without bruits or JVD. Lungs:  Resp regular and unlabored, CTA. Heart: RRR no s3, s4, or murmurs. Abdomen: Soft, non-tender, non-distended, BS + x  4.  Extremities: No clubbing, cyanosis or edema. DP/PT/Radials 2+ and equal bilaterally.  Labs: Triglycerides 90, HDL 52, LDL 149, hemoglobin A1c 7.3%, glucose 162, creatinine 0.9, potassium 4.1, AST 20, ALT 23,  Accessory Clinical Findings  Stress Echocardiogram - 12/26/2013 - Stress ECG conclusions: There were no stress arrhythmias or conduction abnormalities. The stress ECG was normal. - Staged echo: There was no echocardiographic evidence for stress-induced ischemia.  Impressions: - Stress echocardiogram with no chest pain, no ST changes and no stress-induced wall motion abnormalites; normal stress echo.  ECG - normal sinus rhythm, 63 beats per minute, normal EKG    Assessment & Plan  54 year old gentleman with type 1 diabetes on insulin pump, hypertension hyperlipidemia and family history of premature coronary artery disease who is symptomatic with   1. Dyspnea on exertion -  Normal LVEF, normal hyperdynamic response to  stress. Appears euvolemic. Negative stress test and significant improvement after starting exercise. He is encouraged to continue doing so. He has h/o COPD on no therapy - we started Spiriva 10 mg daily    2. Hyperlipidemia - LDL 149, goal less than 70, patient significantly intolerance to Lipitor, Crestor and Zocor with prior arm joint pain, blisters. Started on Livalo and Zetia with significant improvement with most recent LDL 69.  3. HTN  - controlled  4. Insulin-dependent diabetes mellitus - since age of 67 currently on insulin pump, hemoglobin A1c 7.3%, therefore we need to be aggressive with cholesterol and blood pressure management to avoid any atherosclerosis.  5. COPD - started Spiriva  6. Subjective bruit in the neck - normal carotid ultrasound in 2015.  Followup in 1 year.  Dorothy Spark, MD, [facility] 07/28/2014, 2:50 PM

## 2014-08-21 ENCOUNTER — Other Ambulatory Visit: Payer: Self-pay | Admitting: Orthopedic Surgery

## 2014-10-06 ENCOUNTER — Ambulatory Visit
Admission: RE | Admit: 2014-10-06 | Discharge: 2014-10-06 | Disposition: A | Discharge status: Home / Self Care | Payer: BLUE CROSS/BLUE SHIELD | Source: Ambulatory Visit | Attending: Family Medicine | Admitting: Family Medicine

## 2014-10-06 ENCOUNTER — Other Ambulatory Visit: Payer: Self-pay | Admitting: Family Medicine

## 2014-10-06 DIAGNOSIS — R059 Cough, unspecified: Secondary | ICD-10-CM

## 2014-10-06 DIAGNOSIS — R05 Cough: Secondary | ICD-10-CM

## 2015-03-29 ENCOUNTER — Other Ambulatory Visit: Payer: Self-pay | Admitting: Cardiology

## 2015-03-30 NOTE — Telephone Encounter (Signed)
Please advise on refills as these medications are not listed on last office visit and he has not refilled them since 10/03/14. Thanks, MI

## 2015-04-14 ENCOUNTER — Other Ambulatory Visit: Payer: Self-pay | Admitting: Internal Medicine

## 2015-04-14 ENCOUNTER — Other Ambulatory Visit: Payer: Self-pay | Admitting: Family

## 2015-04-14 ENCOUNTER — Ambulatory Visit
Admission: RE | Admit: 2015-04-14 | Discharge: 2015-04-14 | Disposition: A | Discharge status: Home / Self Care | Payer: BLUE CROSS/BLUE SHIELD | Source: Ambulatory Visit | Attending: Family | Admitting: Family

## 2015-04-14 DIAGNOSIS — R059 Cough, unspecified: Secondary | ICD-10-CM

## 2015-04-14 DIAGNOSIS — R05 Cough: Secondary | ICD-10-CM

## 2015-05-14 ENCOUNTER — Ambulatory Visit (INDEPENDENT_AMBULATORY_CARE_PROVIDER_SITE_OTHER): Payer: BLUE CROSS/BLUE SHIELD | Admitting: Pulmonary Disease

## 2015-05-14 ENCOUNTER — Encounter: Payer: Self-pay | Admitting: Pulmonary Disease

## 2015-05-14 VITALS — BP 126/72 | HR 64 | Ht 71.0 in | Wt 178.4 lb

## 2015-05-14 DIAGNOSIS — J449 Chronic obstructive pulmonary disease, unspecified: Secondary | ICD-10-CM

## 2015-05-14 MED ORDER — BUDESONIDE-FORMOTEROL FUMARATE 160-4.5 MCG/ACT IN AERO: 2.0000 | INHALATION_SPRAY | Freq: Every day | RESPIRATORY_TRACT | Status: DC

## 2015-05-14 NOTE — Patient Instructions (Signed)
We will start you on a Symbicort inhaler for COPD and Robitussin plus DM for cough You will be scheduled for lung function tests.  Return to clinic in 2-3 months

## 2015-05-14 NOTE — Progress Notes (Signed)
Subjective:    Patient ID: Jose Harvey, male    DOB: 16-Jan-1961, 54 y.o.   MRN: [id number]  HPI Consult for evaluation of COPD.  Jose Harvey is a 54 year old former heavy smoker with history of diabetes, hypertension, hyperlipidemia.he complains of nonproductive cough, dyspnea on exertion. He's noticed the dyspnea over the past couple of years and has been progressively getting worse. He gets short of breath on moderate exertion. He states that warm air and humidity makes his breathing worse. He denies any dyspnea at rest, wheezing, sputum production. No fevers or chills. He was started on Spiriva earlier this year by his doctor. He used it for 2-3 weeks but stopped because he fee a difference. He has had exacerbations this year and has been on prednisone taper at least 3 times. He had a chest x-ray last month which showed hyperinflation consistent with COPD.  He used to smoke one pack per day for 40 years. Quit in 2009. He works at [employer]. He denies any exposures at work or at home.  CXR (04/14/15) Mild stable hyperinflation consistent with known COPD. There is no active cardiopulmonary disease.  Past Medical History  Diagnosis Date  . Type I (juvenile type) diabetes mellitus without mention of complication, not stated as uncontrolled   . Mixed hyperlipidemia   . Depressive disorder, not elsewhere classified   . BPV (benign positional vertigo)   . GERD (gastroesophageal reflux disease)   . Vesicoureteral reflux   . Anxiety state, unspecified   . Allergic rhinitis, cause unspecified   . 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  aspirin 81 MG tablet, Take 81 mg by mouth daily., Disp: , Rfl:  .  CIALIS 20 MG tablet, TAKE 1/2 OR 1 TABLET BY MOUTH AS NEEDED, Disp: , Rfl: 3 .  glucagon (GLUCAGON EMERGENCY) 1  MG injection, Inject 1 mg into the vein once as needed., Disp: , Rfl:  .  insulin aspart (NOVOLOG) 100 UNIT/ML injection, Inject into the skin 3 (three) times daily before meals. Sliding scale, Disp: , Rfl:  .  lisinopril (PRINIVIL,ZESTRIL) 5 MG tablet, Take 1 tablet by mouth daily., Disp: , Rfl:  .  LIVALO 2 MG TABS, TAKE 1 BY MOUTH DAILY, Disp: 90 tablet, Rfl: 0 .  pantoprazole (PROTONIX) 40 MG tablet, Take 1 tablet by mouth daily., Disp: , Rfl:  .  sertraline (ZOLOFT) 50 MG tablet, Take 50 mg by mouth daily., Disp: , Rfl:  .  ZETIA 10 MG tablet, TAKE 1 BY MOUTH DAILY, Disp: 90 tablet, Rfl: 0 .  budesonide-formoterol (SYMBICORT) 160-4.5 MCG/ACT inhaler, Inhale 2 puffs into the lungs daily., Disp: 1 Inhaler, Rfl: 6   Review of Systems Cough, dyspnea on exertion. Denies any sputum production, wheezing, hemoptysis Denies any chest pain, palpitations Denies any fever, chills, fatigue, malaise, loss of weight, loss of appetite. Denies any nausea, vomiting, constipation.  all other review of systems are negative      Objective:   Physical Exam  Blood pressure 126/72, pulse 64, height 5\' 11"  (1.803 m), weight 178 lb 6.4 oz (80.922 kg), SpO2 98 %.  Gen: No apparent distress Neuro: No gross focal deficits. Neck: No JVD, lymphadenopathy, thyromegaly. RS: clear, no wheeze, crackles CVS: S1-S2 heard, no murmurs rubs gallops. Abdomen: Soft, positive bowel sounds. Extremities: No edema.    Assessment & Plan:  Dyspnea and exertion, cough  He likely has COPD based on his smoking history, x-ray, symptoms. He says the Spiriva did not help him in the past. I will assess his lung function with PFTs and trial him on Symbicort. He'll use Robitussin plus DM for cough and mucus mobilization.  Based on his smoking history he is a candidate for lung cancer screening. I'll address this at his next visit.  Return to clinic in 2-3 months for further evaluation.  Plan: - Symbicort - Robitussin plus  DM - Pulmonary function tests

## 2015-06-23 ENCOUNTER — Other Ambulatory Visit: Payer: Self-pay | Admitting: Cardiology

## 2015-07-30 ENCOUNTER — Ambulatory Visit (INDEPENDENT_AMBULATORY_CARE_PROVIDER_SITE_OTHER): Payer: BLUE CROSS/BLUE SHIELD | Admitting: Pulmonary Disease

## 2015-07-30 ENCOUNTER — Encounter: Payer: Self-pay | Admitting: Pulmonary Disease

## 2015-07-30 VITALS — BP 108/66 | HR 81 | Ht 72.0 in | Wt 178.4 lb

## 2015-07-30 DIAGNOSIS — J449 Chronic obstructive pulmonary disease, unspecified: Secondary | ICD-10-CM

## 2015-07-30 LAB — PULMONARY FUNCTION TEST
DL/VA % pred: 102 %
DL/VA: 4.88 ml/min/mmHg/L
DLCO COR: 25.87 ml/min/mmHg
DLCO UNC % PRED: 71 %
DLCO cor % pred: 73 %
DLCO unc: 25.11 ml/min/mmHg
FEF 25-75 PRE: 2.83 L/s
FEF 25-75 Post: 2.69 L/sec
FEF2575-%Change-Post: -4 %
FEF2575-%Pred-Post: 80 %
FEF2575-%Pred-Pre: 84 %
FEV1-%Change-Post: 0 %
FEV1-%PRED-PRE: 76 %
FEV1-%Pred-Post: 75 %
FEV1-POST: 3.03 L
FEV1-PRE: 3.03 L
FEV1FVC-%CHANGE-POST: 1 %
FEV1FVC-%Pred-Pre: 103 %
FEV6-%CHANGE-POST: 0 %
FEV6-%PRED-POST: 76 %
FEV6-%PRED-PRE: 76 %
FEV6-POST: 3.82 L
FEV6-Pre: 3.83 L
FEV6FVC-%Change-Post: 0 %
FEV6FVC-%PRED-POST: 104 %
FEV6FVC-%Pred-Pre: 103 %
FVC-%Change-Post: -1 %
FVC-%Pred-Post: 72 %
FVC-%Pred-Pre: 74 %
FVC-Post: 3.82 L
FVC-Pre: 3.87 L
POST FEV6/FVC RATIO: 100 %
Post FEV1/FVC ratio: 79 %
Pre FEV1/FVC ratio: 78 %
Pre FEV6/FVC Ratio: 100 %
RV % pred: 92 %
RV: 2.09 L
TLC % PRED: 79 %
TLC: 5.84 L

## 2015-07-30 MED ORDER — BUDESONIDE-FORMOTEROL FUMARATE 160-4.5 MCG/ACT IN AERO: 2.0000 | INHALATION_SPRAY | Freq: Two times a day (BID) | RESPIRATORY_TRACT | Status: DC

## 2015-07-30 NOTE — Progress Notes (Signed)
PFT done today. 

## 2015-07-30 NOTE — Patient Instructions (Signed)
Continue using the Symbicort as prescribed.  Return to clinic in 6 months. 

## 2015-07-30 NOTE — Progress Notes (Signed)
Subjective:    Patient ID: Jose Harvey, male    DOB: [birth year], 55 y.o.   MRN: [id number]  HPI Follow up for evaluation of COPD.  Jose Harvey is a 55 year old former heavy smoker with history of diabetes, hypertension, hyperlipidemia.he complains of nonproductive cough, dyspnea on exertion. He's noticed the dyspnea over the past couple of years and has been progressively getting worse. He gets short of breath on moderate exertion. He states that warm air and humidity makes his breathing worse. He denies any dyspnea at rest, wheezing, sputum production. No fevers or chills. He was started on Spiriva earlier this year by his doctor. He used it for 2-3 weeks but stopped because he feel a difference. He has had exacerbations this year and has been on prednisone taper at least 3 times. He had a chest x-ray last month which showed hyperinflation consistent with COPD.  DATA: CXR (04/14/15) Mild stable hyperinflation consistent with known COPD. There is no active cardiopulmonary disease.  PFTs 07/30/15 FVC 2.87 [and 4%) FEV1 2.03 [76%) F/F 78 TLC 79% DLCO 71% Minimal restrictive process, reduction in DLCO.  Social History: He used to smoke one pack per day for 40 years. Quit in 2009. He works at [employer]. He denies any exposures at work or at home.  Family History: Father-coronary artery disease, diabetes, emphysema, hypertension Grandmother-colon cancer, diabetes, emphysema  Past Medical History  Diagnosis Date  . Type I (juvenile type) diabetes mellitus without mention of complication, not stated as uncontrolled   . Mixed hyperlipidemia   . Depressive disorder, not elsewhere classified   . BPV (benign positional vertigo)   . GERD (gastroesophageal reflux disease)   . Vesicoureteral reflux   . Anxiety state, unspecified   . Allergic rhinitis, cause unspecified   . 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 Type I (juvenile type) diabetes mellitus  with ophthalmic manifestations, not stated as uncontrolled     Current outpatient prescriptions:  .  aspirin 81 MG tablet, Take 81 mg by mouth daily., Disp: , Rfl:  .  budesonide-formoterol (SYMBICORT) 160-4.5 MCG/ACT inhaler, Inhale 2 puffs into the lungs daily., Disp: 1 Inhaler, Rfl: 6 .  CIALIS 20 MG tablet, TAKE 1/2 OR 1 TABLET BY MOUTH AS NEEDED, Disp: , Rfl: 3 .  glucagon (GLUCAGON EMERGENCY) 1 MG injection, Inject 1 mg into the vein once as needed., Disp: , Rfl:  .  insulin aspart (NOVOLOG) 100 UNIT/ML injection, Inject into the skin 3 (three) times daily before meals. Sliding scale, Disp: , Rfl:  .  lisinopril (PRINIVIL,ZESTRIL) 5 MG tablet, Take 1 tablet by mouth daily., Disp: , Rfl:  .  LIVALO 2 MG TABS, TAKE 1 BY MOUTH DAILY, Disp: 90 tablet, Rfl: 0 .  Multiple Vitamins-Minerals (ICAPS AREDS 2) CAPS, Take 1 capsule by mouth daily., Disp: , Rfl:  .  pantoprazole (PROTONIX) 40 MG tablet, Take 1 tablet by mouth daily., Disp: , Rfl:  .  sertraline (ZOLOFT) 50 MG tablet, Take 50 mg by mouth daily., Disp: , Rfl:  .  ZETIA 10 MG tablet, TAKE 1 BY MOUTH DAILY, Disp: 90 tablet, Rfl: 0   Review of Systems Cough, dyspnea on exertion. Denies any sputum production, wheezing, hemoptysis Denies any chest pain, palpitations Denies any fever, chills, fatigue, malaise, loss of weight, loss of appetite. Denies any nausea, vomiting, constipation.  all other review of systems are negative      Objective:   Physical Exam  Blood pressure 108/66, pulse 81, height  6' (1.829 m), weight 178 lb 6.4 oz (80.922 kg), SpO2 99 %. Gen: No apparent distress Neuro: No gross focal deficits. Neck: No JVD, lymphadenopathy, thyromegaly. RS: clear, no wheeze, crackles CVS: S1-S2 heard, no murmurs rubs gallops. Abdomen: Soft, positive bowel sounds. Extremities: No edema.    Assessment & Plan:  Dyspnea and exertion, cough   PFTs do not show any overt obstruction but he likely has COPD based on his smoking  history, x-ray findings of hyperinflation, symptoms. He says the Spiriva did not help him in the past. But he has responded well to Symbicort and he'll continue the same.  He is a candidate for lung cancer screening based on his smoking history. I'll address this at his next visit.  Plan: - Continue Symbicort - Robitussin plus DM for cough.  Return to clinic on 6 months.  Marshell Garfinkel MD [facility] Pager [phone] If no answer or after 3pm call: [phone] 07/30/2015, 12:06 PM

## 2015-07-30 NOTE — Addendum Note (Signed)
Addended by: Parke Poisson E on: 07/30/2015 02:43 PM   Modules accepted: Orders

## 2015-09-17 ENCOUNTER — Other Ambulatory Visit: Payer: Self-pay | Admitting: Cardiology

## 2015-09-21 ENCOUNTER — Other Ambulatory Visit: Payer: Self-pay | Admitting: *Deleted

## 2015-09-21 MED ORDER — EZETIMIBE 10 MG PO TABS: ORAL_TABLET | ORAL | Status: DC

## 2015-09-21 MED ORDER — PITAVASTATIN CALCIUM 2 MG PO TABS: ORAL_TABLET | ORAL | Status: DC

## 2015-11-05 ENCOUNTER — Ambulatory Visit
Admission: RE | Admit: 2015-11-05 | Discharge: 2015-11-05 | Disposition: A | Discharge status: Home / Self Care | Payer: BLUE CROSS/BLUE SHIELD | Source: Ambulatory Visit | Attending: Family Medicine | Admitting: Family Medicine

## 2015-11-05 ENCOUNTER — Other Ambulatory Visit: Payer: Self-pay | Admitting: Family Medicine

## 2015-11-05 DIAGNOSIS — R52 Pain, unspecified: Secondary | ICD-10-CM

## 2016-01-31 ENCOUNTER — Encounter: Payer: Self-pay | Admitting: Pulmonary Disease

## 2016-01-31 ENCOUNTER — Ambulatory Visit (INDEPENDENT_AMBULATORY_CARE_PROVIDER_SITE_OTHER): Payer: BLUE CROSS/BLUE SHIELD | Admitting: Pulmonary Disease

## 2016-01-31 VITALS — BP 106/58 | HR 77 | Ht 71.0 in | Wt 170.2 lb

## 2016-01-31 DIAGNOSIS — Z87891 Personal history of nicotine dependence: Secondary | ICD-10-CM

## 2016-01-31 DIAGNOSIS — J449 Chronic obstructive pulmonary disease, unspecified: Secondary | ICD-10-CM

## 2016-01-31 MED ORDER — BUDESONIDE-FORMOTEROL FUMARATE 160-4.5 MCG/ACT IN AERO: 1.0000 | INHALATION_SPRAY | Freq: Two times a day (BID) | RESPIRATORY_TRACT | 2 refills | Status: DC

## 2016-01-31 NOTE — Addendum Note (Signed)
Addended by: Parke Poisson E on: 01/31/2016 09:46 AM   Modules accepted: Orders

## 2016-01-31 NOTE — Patient Instructions (Signed)
Continue using Symbicort. Please take 1 puff twice daily. We will refer you for the lung cancer screening program.  Return to clinic in 6 months.

## 2016-01-31 NOTE — Progress Notes (Signed)
Jose Harvey    [id number]    1960-08-30  Primary Care Physician:FULP, CAMMIE, MD  Referring Physician: Antony Blackbird, MD [address], [place] [address]  Chief complaint:  Follow up for evaluation of COPD.  HPI: Jose Harvey is a 55 year old former heavy smoker with history of diabetes, hypertension, hyperlipidemia.he complains of nonproductive cough, dyspnea on exertion. He's noticed the dyspnea over the past couple of years and has been progressively getting worse. He gets short of breath on moderate exertion. He states that warm air and humidity makes his breathing worse. He denies any dyspnea at rest, wheezing, sputum production. No fevers or chills. He was started on Spiriva by his doctor. He used it for 2-3 weeks but stopped because he feel a difference. He has had exacerbations this year and has been on prednisone taper at least 3 times. He had a chest x-ray which showed hyperinflation consistent with COPD.   Outpatient Encounter Prescriptions as of 01/31/2016  Medication Sig  . ALPRAZolam (XANAX) 0.25 MG tablet Take 0.25 mg by mouth daily.  Marland Kitchen aspirin 81 MG tablet Take 81 mg by mouth daily.  . budesonide-formoterol (SYMBICORT) 160-4.5 MCG/ACT inhaler Inhale 2 puffs into the lungs 2 (two) times daily.  Marland Kitchen CIALIS 20 MG tablet TAKE 1/2 OR 1 TABLET BY MOUTH AS NEEDED  . DEXILANT 60 MG capsule Take 60 mg by mouth daily.  Marland Kitchen ezetimibe (ZETIA) 10 MG tablet TAKE 1 BY MOUTH DAILY  . glucagon (GLUCAGON EMERGENCY) 1 MG injection Inject 1 mg into the vein once as needed.  . insulin aspart (NOVOLOG) 100 UNIT/ML injection Inject into the skin 3 (three) times daily before meals. Sliding scale  . lisinopril (PRINIVIL,ZESTRIL) 5 MG tablet Take 1 tablet by mouth daily.  . Multiple Vitamins-Minerals (ICAPS AREDS 2) CAPS Take 1 capsule by mouth daily.  . pantoprazole (PROTONIX) 40 MG tablet Take 1 tablet by mouth daily.  . Pitavastatin Calcium (LIVALO) 2 MG TABS TAKE 1 BY MOUTH  DAILY  . sertraline (ZOLOFT) 100 MG tablet Take 100 mg by mouth daily.  . [DISCONTINUED] sertraline (ZOLOFT) 50 MG tablet Take 50 mg by mouth daily.   No facility-administered encounter medications on file as of 01/31/2016.     Allergies as of 01/31/2016 - Review Complete 01/31/2016  Allergen Reaction Noted  . Crestor [rosuvastatin]  11/27/2013  . Lipitor [atorvastatin]  11/27/2013  . Other Other (See Comments) 11/17/2013  . Zocor [simvastatin] Other (See Comments) 11/17/2013    Past Medical History:  Diagnosis Date  . Allergic rhinitis, cause unspecified   . Anxiety state, unspecified   . BPV (benign positional vertigo)   . Depressive disorder, not elsewhere classified   . GERD (gastroesophageal reflux disease)   . 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 Mixed hyperlipidemia   . 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 Type I (juvenile type) diabetes mellitus without mention of complication, not stated as uncontrolled   . Vesicoureteral reflux     History reviewed. No pertinent surgical history.  Family History  Problem Relation Age of Onset  . Diabetes type I Father   . Heart attack Father   . Hypertension Father   . Emphysema Father   . CAD Father   . Colon cancer Maternal Grandfather   . Emphysema Paternal Grandfather   . Diabetes type I Paternal Grandfather   . Kidney disease Neg Hx   .  Liver disease Neg Hx     Social History   Social History  . Marital status: Married    Spouse name: N/A  . Number of children: N/A  . Years of education: N/A   Occupational History  . Not on file.   Social History Main Topics  . Smoking status: Former Smoker    Packs/day: 1.50    Years: 31.00    Types: Cigarettes    Quit date: 06/05/2008  . Smokeless tobacco: Never Used  . Alcohol use 3.6 oz/week    6 Cans of beer per week     Comment: occasional 6pk per week  . Drug use: No  . Sexual  activity: Not on file   Other Topics Concern  . Not on file   Social History Narrative  . No narrative on file     Review of systems: Review of Systems  Constitutional: Negative for fever and chills.  HENT: Negative.   Eyes: Negative for blurred vision.  Respiratory: as per HPI  Cardiovascular: Negative for chest pain and palpitations.  Gastrointestinal: Negative for vomiting, diarrhea, blood per rectum. Genitourinary: Negative for dysuria, urgency, frequency and hematuria.  Musculoskeletal: Negative for myalgias, back pain and joint pain.  Skin: Negative for itching and rash.  Neurological: Negative for dizziness, tremors, focal weakness, seizures and loss of consciousness.  Endo/Heme/Allergies: Negative for environmental allergies.  Psychiatric/Behavioral: Negative for depression, suicidal ideas and hallucinations.  All other systems reviewed and are negative.   Physical Exam: Blood pressure (!) 106/58, pulse 77, height 5\' 11"  (1.803 m), weight 170 lb 3.2 oz (77.2 kg), SpO2 99 %. Gen:      No acute distress HEENT:  EOMI, sclera anicteric Neck:     No masses; no thyromegaly Lungs:    Clear to auscultation bilaterally; normal respiratory effort CV:         Regular rate and rhythm; no murmurs Abd:      + bowel sounds; soft, non-tender; no palpable masses, no distension Ext:    No edema; adequate peripheral perfusion Skin:      Warm and dry; no rash Neuro: alert and oriented x 3 Psych: normal mood and affect  Data Reviewed: CXR (04/14/15) Mild stable hyperinflation consistent with known COPD. There is no active cardiopulmonary disease.  PFTs 07/30/15 FVC 2.87 [and 4%) FEV1 2.03 [76%) F/F 78 TLC 79% DLCO 71% Minimal restrictive process, reduction in DLCO.  Assessment:  Dyspnea and exertion, cough   PFTs do not show any overt obstruction but he likely has COPD based on his smoking history, x-ray findings of hyperinflation, symptoms. He says the Spiriva did not help  him in the past. But he has responded well to Symbicort and he'll continue the same. We will reduce the dose to 1 puff twice daily. If he continues to be stable in the future we can take him off inhalers altogheter He is a candidate for lung cancer screening based on his smoking history.  Plan/Recommendations: - Continue Symbicort - Lung cancer screening referral   Return to clinic on 6 months.  Marshell Garfinkel MD [facility] Pager [phone] 01/31/2016, 9:10 AM  CC: Jose Blackbird, MD

## 2016-02-01 ENCOUNTER — Ambulatory Visit: Payer: BLUE CROSS/BLUE SHIELD | Admitting: Pulmonary Disease

## 2016-02-01 ENCOUNTER — Other Ambulatory Visit: Payer: Self-pay | Admitting: Acute Care

## 2016-02-01 DIAGNOSIS — Z87891 Personal history of nicotine dependence: Secondary | ICD-10-CM

## 2016-02-16 ENCOUNTER — Ambulatory Visit (INDEPENDENT_AMBULATORY_CARE_PROVIDER_SITE_OTHER): Payer: BLUE CROSS/BLUE SHIELD | Admitting: Acute Care

## 2016-02-16 ENCOUNTER — Ambulatory Visit (INDEPENDENT_AMBULATORY_CARE_PROVIDER_SITE_OTHER)
Admission: RE | Admit: 2016-02-16 | Discharge: 2016-02-16 | Disposition: A | Discharge status: Home / Self Care | Payer: BLUE CROSS/BLUE SHIELD | Source: Ambulatory Visit | Attending: Acute Care | Admitting: Acute Care

## 2016-02-16 ENCOUNTER — Telehealth: Payer: Self-pay | Admitting: Acute Care

## 2016-02-16 ENCOUNTER — Encounter: Payer: Self-pay | Admitting: Acute Care

## 2016-02-16 DIAGNOSIS — Z87891 Personal history of nicotine dependence: Secondary | ICD-10-CM

## 2016-02-16 DIAGNOSIS — R911 Solitary pulmonary nodule: Secondary | ICD-10-CM

## 2016-02-16 NOTE — Telephone Encounter (Signed)
Spoke with [place] at [facility]. She is calling to let us know that the pt's CT results are in Epic.  IMPRESSION: 1. Lung-RADS Category 4A, suspicious. Follow up low-dose chest CT without contrast in 3 months (please use the following order, "CT CHEST LCS NODULE FOLLOW-UP W/O CM") is recommended. Alternatively, PET may be considered when there is a solid component 27mm or larger. 2. The "S" modifier above refers to potentially clinically significant non lung cancer related findings. Specifically, multiple intermediate attenuation liver lesions, and a large left adrenal nodule which are indeterminate, but are considered concerning for potential metastatic disease, and further evaluation with dedicated contrast-enhanced CT the abdomen and pelvis is suggested in the near future to better evaluate these findings. Alternatively, if PET-CT is planned for evaluation of the right lower lobe nodule, assessment of these findings could be performed by PET-CT is well. 3. Aortic atherosclerosis, in addition to 2 vessel coronary artery disease. Please note that although the presence of coronary artery calcium documents the presence of coronary artery disease, the severity of this disease and any potential stenosis cannot be assessed on this non-gated CT examination. Assessment for potential risk factor modification, dietary therapy or pharmacologic therapy may be warranted, if clinically indicated. These results will be called to the ordering clinician or representative by the Radiologist Assistant, and communication documented in the PACS or zVision Dashboard.  Will route message to SG to make her aware.

## 2016-02-16 NOTE — Progress Notes (Signed)
Shared Decision Making Visit Lung Cancer Screening Program [phone])   Eligibility:  Age 55 y.o.  Pack Years Smoking History Calculation 37.5 (# packs/per year x # years smoked)  Recent History of coughing up blood  no  Unexplained weight loss? no ( >Than 15 pounds within the last 6 months )  Prior History Lung / other cancer no (Diagnosis within the last 5 years already requiring surveillance chest CT Scans).  Smoking Status Former Smoker  Former Smokers: Years since quit: 7 years  Quit Date: 01/2009  Visit Components:  Discussion included one or more decision making aids. yes  Discussion included risk/benefits of screening. yes  Discussion included potential follow up diagnostic testing for abnormal scans. yes  Discussion included meaning and risk of over diagnosis.yes  Discussion included meaning and risk of False Positives. yes  Discussion included meaning of total radiation exposure. yes  Counseling Included:  Importance of adherence to annual lung cancer LDCT screening. yes  Impact of comorbidities on ability to participate in the program. yes  Ability and willingness to under diagnostic treatment. yes  Smoking Cessation Counseling:  Current Smokers:   Discussed importance of smoking cessation. yes  Information about tobacco cessation classes and interventions provided to patient. yes  Patient provided with "ticket" for LDCT Scan. yes  Symptomatic Patient. no  Counseling Former smoker  Diagnosis Code: Tobacco Use Z72.0  Asymptomatic Patient yes  Counseling Former smoker  Former Smokers:   Discussed the importance of maintaining cigarette abstinence. yes  Diagnosis Code: Personal History of Nicotine Dependence. [id number]  Information about tobacco cessation classes and interventions provided to patient. Yes  Patient provided with "ticket" for LDCT Scan. yes  Written Order for Lung Cancer Screening with LDCT placed in Epic. Yes (CT Chest Lung  Cancer Screening Low Dose W/O CM) [id number] Z12.2-Screening of respiratory organs Z87.891-Personal history of nicotine dependence   I spent 20 minutes of face to face time with Mr. Gears discussing the risks and benefits of lung cancer screening. We viewed a power point together that explained in detail the above noted topics. We took the time to pause the power point at intervals to allow for questions to be asked and answered to ensure understanding. We discussed that he had taken the single most powerful action possible to decrease his risk of developing lung cancer when he quit smoking. I counseled him to remain smoke free, and to contact me if he ever had the desire to smoke again so that I can provide resources and tools to help support the effort to remain smoke free. We discussed the time and location of the scan, and that either [place] or I will call with the results within  24-48 hours of receiving them. He has my card and contact information in the event he needs to speak with me, in addition to a copy of the power point we reviewed as a resource. He verbalized understanding of all of the above and had no further questions upon leaving the office.    Magdalen Spatz, NP 02/16/2016

## 2016-02-18 ENCOUNTER — Encounter: Payer: Self-pay | Admitting: Acute Care

## 2016-02-18 NOTE — Telephone Encounter (Signed)
I have called the results of the low-dose CT screening scan to [facility]. I explained to him that his scan was read as a Lung RADS 4 A : suspicious findings, either short term follow up in 3 months or alternatively  PET Scan evaluation may be considered when there is a solid component of  8 mm or larger. I explained that in addition to the pulmonary nodule there were areas noted on his liver and in his left adrenal gland that warrant further investigation. I told him based on this, that we would proceed with a PET scan in order to further evaluate the pulmonary nodule, and areas on the liver and left adrenal gland. I  told him once the PET scan was completed, we would schedule him in the Pulmonary  office with Dr. Kimber Relic, whom  with I have already  discussed the result of the scan. Jose Harvey verbalized understanding of the above. He is diabetic, and I did make sure he knew to make the PET scan scheduler aware of this as they can give him the appropriate instructions prior to the scan.

## 2016-02-24 ENCOUNTER — Encounter: Payer: Self-pay | Admitting: Cardiology

## 2016-02-25 ENCOUNTER — Encounter (HOSPITAL_COMMUNITY)
Admission: RE | Admit: 2016-02-25 | Discharge: 2016-02-25 | Disposition: A | Discharge status: Home / Self Care | Payer: BLUE CROSS/BLUE SHIELD | Source: Ambulatory Visit | Attending: Acute Care | Admitting: Acute Care

## 2016-02-25 DIAGNOSIS — R911 Solitary pulmonary nodule: Secondary | ICD-10-CM | POA: Diagnosis present

## 2016-02-25 DIAGNOSIS — Z87891 Personal history of nicotine dependence: Secondary | ICD-10-CM | POA: Diagnosis not present

## 2016-02-25 LAB — GLUCOSE, CAPILLARY: Glucose-Capillary: 147 mg/dL — ABNORMAL HIGH (ref 65–99)

## 2016-02-25 MED ORDER — FLUDEOXYGLUCOSE F - 18 (FDG) INJECTION
8.4600 | Freq: Once | INTRAVENOUS | Status: AC | PRN
  Administered 2016-02-25: 8.46 via INTRAVENOUS

## 2016-02-29 ENCOUNTER — Encounter: Payer: Self-pay | Admitting: Pulmonary Disease

## 2016-02-29 ENCOUNTER — Ambulatory Visit (INDEPENDENT_AMBULATORY_CARE_PROVIDER_SITE_OTHER): Payer: BLUE CROSS/BLUE SHIELD | Admitting: Pulmonary Disease

## 2016-02-29 VITALS — BP 122/64 | HR 75 | Ht 71.0 in | Wt 165.8 lb

## 2016-02-29 DIAGNOSIS — R160 Hepatomegaly, not elsewhere classified: Secondary | ICD-10-CM

## 2016-02-29 DIAGNOSIS — Z87891 Personal history of nicotine dependence: Secondary | ICD-10-CM

## 2016-02-29 DIAGNOSIS — Z23 Encounter for immunization: Secondary | ICD-10-CM | POA: Diagnosis not present

## 2016-02-29 NOTE — Patient Instructions (Signed)
We will schedule you for a MRI abdomen with and without contrast to further evaluate the liver and adrenal lesions. Your PET scan does not show any suspicious activity. We will need to repeat the CT chest in 6 months.  Return after scan.

## 2016-02-29 NOTE — Progress Notes (Signed)
JERRYN PICCHI    LA:[id number]    March 02, 1961  Primary Care Physician:FULP, CAMMIE, MD  Referring Physician: Antony Blackbird, MD [address], [place] [address]  Chief complaint:   Follow up for COPD  HPI: Mr. Brueggemann is a 55 year old former heavy smoker with history of diabetes, hypertension, hyperlipidemia.he complains of nonproductive cough, dyspnea on exertion. He's noticed the dyspnea over the past couple of years and has been progressively getting worse. He gets short of breath on moderate exertion. He states that warm air and humidity makes his breathing worse. He denies any dyspnea at rest, wheezing, sputum production. No fevers or chills. He was started on Spiriva by his doctor. He used it for 2-3 weeks but stopped because he feel a difference. He has had exacerbations this year and has been on prednisone taper at least 3 times. He had a chest x-ray which showed hyperinflation consistent with COPD.  Since his last visit he continues on the Symbicort. He feels that this has improved his symptoms. He's had a low-dose CT which showed some suspicious pulmonary nodules and underwent a subsequent PET scan which showed low uptake (see report below).  Outpatient Encounter Prescriptions as of 02/29/2016  Medication Sig  . ALPRAZolam (XANAX) 0.25 MG tablet Take 0.25 mg by mouth daily.  Marland Kitchen aspirin 81 MG tablet Take 81 mg by mouth daily.  . budesonide-formoterol (SYMBICORT) 160-4.5 MCG/ACT inhaler Inhale 1 puff into the lungs 2 (two) times daily.  Marland Kitchen CIALIS 20 MG tablet TAKE 1/2 OR 1 TABLET BY MOUTH AS NEEDED  . DEXILANT 60 MG capsule Take 60 mg by mouth daily.  Marland Kitchen ezetimibe (ZETIA) 10 MG tablet TAKE 1 BY MOUTH DAILY  . glucagon (GLUCAGON EMERGENCY) 1 MG injection Inject 1 mg into the vein once as needed.  . insulin aspart (NOVOLOG) 100 UNIT/ML injection Inject into the skin 3 (three) times daily before meals. Sliding scale  . lisinopril (PRINIVIL,ZESTRIL) 5 MG tablet Take 1  tablet by mouth daily.  . Multiple Vitamins-Minerals (ICAPS AREDS 2) CAPS Take 1 capsule by mouth daily.  . pantoprazole (PROTONIX) 40 MG tablet Take 1 tablet by mouth daily.  . Pitavastatin Calcium (LIVALO) 2 MG TABS TAKE 1 BY MOUTH DAILY  . sertraline (ZOLOFT) 100 MG tablet Take 100 mg by mouth daily.   No facility-administered encounter medications on file as of 02/29/2016.     Allergies as of 02/29/2016 - Review Complete 02/16/2016  Allergen Reaction Noted  . Crestor [rosuvastatin]  11/27/2013  . Lipitor [atorvastatin]  11/27/2013  . Other Other (See Comments) 11/17/2013  . Zocor [simvastatin] Other (See Comments) 11/17/2013    Past Medical History:  Diagnosis Date  . Allergic rhinitis, cause unspecified   . Anxiety state, unspecified   . BPV (benign positional vertigo)   . Depressive disorder, not elsewhere classified   . GERD (gastroesophageal reflux disease)   . 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 Mixed hyperlipidemia   . 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 Type I (juvenile type) diabetes mellitus without mention of complication, not stated as uncontrolled   . Vesicoureteral reflux     No past surgical history on file.  Family History  Problem Relation Age of Onset  . Diabetes type I Father   . Heart attack Father   . Hypertension Father   . Emphysema Father   . CAD Father   . Colon cancer  Maternal Grandfather   . Emphysema Paternal Grandfather   . Diabetes type I Paternal Grandfather   . Kidney disease Neg Hx   . Liver disease Neg Hx     Social History   Social History  . Marital status: Legally Separated    Spouse name: N/A  . Number of children: N/A  . Years of education: N/A   Occupational History  . Not on file.   Social History Main Topics  . Smoking status: Former Smoker    Packs/day: 1.50    Years: 27.00    Types: Cigarettes    Quit date: 06/05/2008  .  Smokeless tobacco: Former Systems developer    Types: Snuff    Quit date: 2015  . Alcohol use 3.6 oz/week    6 Cans of beer per week     Comment: occasional 6pk per week  . Drug use: No  . Sexual activity: Not on file   Other Topics Concern  . Not on file   Social History Narrative  . No narrative on file     Review of systems: Review of Systems  Constitutional: Negative for fever and chills.  HENT: Negative.   Eyes: Negative for blurred vision.  Respiratory: as per HPI  Cardiovascular: Negative for chest pain and palpitations.  Gastrointestinal: Negative for vomiting, diarrhea, blood per rectum. Genitourinary: Negative for dysuria, urgency, frequency and hematuria.  Musculoskeletal: Negative for myalgias, back pain and joint pain.  Skin: Negative for itching and rash.  Neurological: Negative for dizziness, tremors, focal weakness, seizures and loss of consciousness.  Endo/Heme/Allergies: Negative for environmental allergies.  Psychiatric/Behavioral: Negative for depression, suicidal ideas and hallucinations.  All other systems reviewed and are negative.   Physical Exam: There were no vitals taken for this visit. Gen:      No acute distress HEENT:  EOMI, sclera anicteric Neck:     No masses; no thyromegaly Lungs:    Clear to auscultation bilaterally; normal respiratory effort CV:         Regular rate and rhythm; no murmurs Abd:      + bowel sounds; soft, non-tender; no palpable masses, no distension Ext:    No edema; adequate peripheral perfusion Skin:      Warm and dry; no rash Neuro: alert and oriented x 3 Psych: normal mood and affect  Data Reviewed: Imaging CXR (04/14/15) Mild stable hyperinflation consistent with known COPD. There is no active cardiopulmonary disease.  Low dose CT chest 02/16/16 Subcentimeter pulmonary nodules, largest one 8.3 mm in the right lower lobe. Poorly characterized lesions in the liver and adrenal gland.Mild diffuse bronchial wall thickening with  very mild centrilobular and paraseptal emphysema. Images reviewed.  PET scan  02/25/16 Low-grade PET uptake in the right lower lobe nodule. No uptake in the liver, mild uptake in the adrenal mass. Images reviewed.  PFTs 07/30/15 FVC 2.87 [and 4%) FEV1 2.03 [76%) F/F 78 TLC 79% DLCO 71% Minimal restrictive process, reduction in DLCO.  Assessment:  #1 Mild COPD PFTs do not show any overt obstruction but he likely has COPD based on his smoking history, CT findings of emphysema. He says the Spiriva did not help him in the past. But he has responded well to Symbicort and he'll continue the same. If he continues to be stable in the future we can try him off inhalers altogheter  #2 Lung nodules  Low grade uptake on PET scan is reassuring. We will order a repeat CT follow up in 6 months. He will  also need an MRI abd for further evaluation of liver, adrenal lesions.  Plan/Recommendations: - Continue Symbicort - MRI abd, Follow up CT chest in 6 months.   Marshell Garfinkel MD [facility] Pager [phone] 02/29/2016, 11:13 AM  CC: Antony Blackbird, MD

## 2016-03-01 ENCOUNTER — Telehealth: Payer: Self-pay | Admitting: Acute Care

## 2016-03-01 NOTE — Telephone Encounter (Signed)
I called Mr. Jose Harvey  With the results of his PET scan. He had been to see Dr. Vaughan Browner yesterday and the scan was reviewed with him. He  is scheduled to have an abdominal MRI this Friday as follow up of the liver masses and adrenal mass.. I explained that he will have a repeat CT chest to follow up on his pulmonary nodules. All imaging has been ordered. Jose Harvey has no questions , but has my contact information in the event he has questions in the future.

## 2016-03-03 ENCOUNTER — Telehealth: Payer: Self-pay | Admitting: Pulmonary Disease

## 2016-03-03 ENCOUNTER — Ambulatory Visit (HOSPITAL_COMMUNITY)
Admission: RE | Admit: 2016-03-03 | Discharge: 2016-03-03 | Disposition: A | Discharge status: Home / Self Care | Payer: BLUE CROSS/BLUE SHIELD | Source: Ambulatory Visit | Attending: Pulmonary Disease | Admitting: Pulmonary Disease

## 2016-03-03 ENCOUNTER — Other Ambulatory Visit: Payer: Self-pay | Admitting: *Deleted

## 2016-03-03 DIAGNOSIS — I700 Atherosclerosis of aorta: Secondary | ICD-10-CM | POA: Diagnosis not present

## 2016-03-03 DIAGNOSIS — K829 Disease of gallbladder, unspecified: Secondary | ICD-10-CM | POA: Insufficient documentation

## 2016-03-03 DIAGNOSIS — R932 Abnormal findings on diagnostic imaging of liver and biliary tract: Secondary | ICD-10-CM

## 2016-03-03 DIAGNOSIS — R945 Abnormal results of liver function studies: Secondary | ICD-10-CM | POA: Diagnosis not present

## 2016-03-03 DIAGNOSIS — K769 Liver disease, unspecified: Secondary | ICD-10-CM | POA: Insufficient documentation

## 2016-03-03 DIAGNOSIS — E278 Other specified disorders of adrenal gland: Secondary | ICD-10-CM | POA: Diagnosis not present

## 2016-03-03 DIAGNOSIS — R160 Hepatomegaly, not elsewhere classified: Secondary | ICD-10-CM

## 2016-03-03 LAB — POCT I-STAT CREATININE: Creatinine, Ser: 0.9 mg/dL (ref 0.61–1.24)

## 2016-03-03 MED ORDER — GADOBENATE DIMEGLUMINE 529 MG/ML IV SOLN
15.0000 mL | Freq: Once | INTRAVENOUS | Status: AC | PRN
  Administered 2016-03-03: 15 mL via INTRAVENOUS

## 2016-03-03 MED ORDER — PITAVASTATIN CALCIUM 2 MG PO TABS: 2.0000 mg | ORAL_TABLET | Freq: Every day | ORAL | 0 refills | Status: DC

## 2016-03-03 MED ORDER — EZETIMIBE 10 MG PO TABS: 10.0000 mg | ORAL_TABLET | Freq: Every day | ORAL | 0 refills | Status: DC

## 2016-03-03 NOTE — Telephone Encounter (Signed)
Spoke with Marjory Lies at [facility] radiology, pt's MRI abdomen w and wo contrast was scheduled as a MRA which will not evaluate liver and adrenal mass as PM was trying to do per radiologist recommendations post PET.   Corrected order placed with Aaron's assistance.   [facility] is looking to see if this new order will require a precert.   Per [facility] the precert was required and has in fact been precerted.   Clydene Laming in radiology, is aware that procedure's PA has been precerted.   Nothing further needed.

## 2016-03-05 DIAGNOSIS — K824 Cholesterolosis of gallbladder: Secondary | ICD-10-CM

## 2016-03-05 HISTORY — DX: Cholesterolosis of gallbladder: K82.4

## 2016-03-06 ENCOUNTER — Other Ambulatory Visit: Payer: Self-pay | Admitting: Pulmonary Disease

## 2016-03-06 DIAGNOSIS — K824 Cholesterolosis of gallbladder: Secondary | ICD-10-CM

## 2016-03-06 NOTE — Progress Notes (Signed)
Orders only encounter created for referral to surgery for the gal bladder lesion.

## 2016-03-08 ENCOUNTER — Other Ambulatory Visit: Payer: Self-pay | Admitting: *Deleted

## 2016-03-08 ENCOUNTER — Ambulatory Visit: Payer: Self-pay | Admitting: Surgery

## 2016-03-08 MED ORDER — EZETIMIBE 10 MG PO TABS: 10.0000 mg | ORAL_TABLET | Freq: Every day | ORAL | 3 refills | Status: DC

## 2016-03-08 MED ORDER — PITAVASTATIN CALCIUM 2 MG PO TABS: 2.0000 mg | ORAL_TABLET | Freq: Every day | ORAL | 3 refills | Status: DC

## 2016-03-08 NOTE — H&P (Signed)
Jose Harvey. Jose Harvey 03/08/2016 3:36 PM Location: [facility] Patient #: Jose Harvey DOB: [birth year] Single / Language: Jose Harvey / Race: White Male  History of Present Illness (Jose Wisener A. Kae Heller MD; 03/08/2016 4:05 PM) Patient words: This is a very nice 55 year old man who presents with incidental finding of a gallbladder polyp on recent MRI. He is a former heavy smoker with a history of diabetes (on insulin pump), hypertension and hyperlipidemia. He does have mild dyspnea on exertion but states he could climb 2 flights of stairs without difficulty. He has had some COPD exacerbations in the last year that have required prednisone tapers. He was recommended lung cancer screening with low-dose CT of the chest which she underwent on 13 September, which showed multiple subcentimeter nodules, the largest of which was 8.3 mm, and follow-up PET scan showed low uptake. However, the CT scan also incidentally found multiple liver masses and a left adrenal mass-these had no uptake on PET scan in the liver and mild uptake in the adrenal mass. 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 A new incidental finding was observed on this MRI, a 1 cm enhancing pedunculated lesion associated with the wall of the gallbladder which could represent either a benign polyp or malignancy. There was no mention of any uptake in this region on the prior PET scan.  He is asymptomatic-he denies any abdominal pain, nausea, vomiting, or change in bowel function. Denies any jaundice or acholic stools, denies fevers chills or night sweats. He did have a little bit of weight loss recently but attributes this to significant stressors in his life, including the acquisition of his 46-year-old granddaughter whom he is now raising. Family history is notable for colon cancer in his maternal grandfather but no  other cancers.  The patient is a 55 year old male.   Other Problems Jose Harvey, Jose Harvey; 03/08/2016 3:39 PM) Anxiety Disorder Chronic Obstructive Lung Disease Diabetes Mellitus  Past Surgical History Jose Harvey, Jose Harvey; 03/08/2016 3:39 PM) Oral Surgery Vasectomy  Diagnostic Studies History Jose Harvey, CMA; 03/08/2016 3:39 PM) Colonoscopy 1-5 years ago  Allergies Jose Harvey, CMA; 03/08/2016 3:40 PM) Crestor *ANTIHYPERLIPIDEMICS* Lipitor *ANTIHYPERLIPIDEMICS* Zocor *ANTIHYPERLIPIDEMICS*  Medication History Jose Harvey, CMA; 03/08/2016 3:40 PM) ALPRAZolam (0.25MG  Tablet, Oral) Active. Cialis (20MG  Tablet, Oral) Active. Dexilant (60MG  Capsule DR, Oral) Active. Lisinopril (5MG  Tablet, Oral) Active. NovoLOG (100UNIT/ML Solution, Subcutaneous) Active. Sertraline HCl (100MG  Tablet, Oral) Active. Symbicort (160-4.5MCG/ACT Aerosol, Inhalation) Active. Zetia (10MG  Tablet, Oral) Active. Sertraline HCl (50MG  Tablet, Oral) Active. Medications Reconciled  Social History (Jose Harvey; 03/08/2016 3:39 PM) Alcohol use Moderate alcohol use. Caffeine use Carbonated beverages, Coffee. Tobacco use Former smoker.  Family History Jose Harvey, Jose Harvey; 03/08/2016 3:39 PM) Alcohol Abuse Mother. Arthritis Mother. Colon Cancer Family Members In General. Diabetes Mellitus Family Members In General, Father. Heart Disease Father. Heart disease in male family member before age 1 Respiratory Condition Father.     Review of Systems Jose Harvey CMA; 03/08/2016 3:39 PM) General Not Present- Appetite Loss, Chills, Fatigue, Fever, Night Sweats, Weight Gain and Weight Loss. Skin Not Present- Change in Wart/Mole, Dryness, Hives, Jaundice, New Lesions, Non-Healing Wounds, Rash and Ulcer. HEENT Present- Seasonal Allergies and Wears glasses/contact lenses. Not Present- Earache, Hearing Loss, Hoarseness, Nose Bleed, Oral Ulcers, Ringing in the Ears, Sinus Pain, Sore Throat,  Visual Disturbances and Yellow Eyes. Respiratory Present- Snoring. Not Present- Bloody sputum, Chronic Cough, Difficulty Breathing and Wheezing.  Breast Not Present- Breast Mass, Breast Pain, Nipple Discharge and Skin Changes. Cardiovascular Present- Shortness of Breath. Not Present- Chest Pain, Difficulty Breathing Lying Down, Leg Cramps, Palpitations, Rapid Heart Rate and Swelling of Extremities. Gastrointestinal Not Present- Abdominal Pain, Bloating, Bloody Stool, Change in Bowel Habits, Chronic diarrhea, Constipation, Difficulty Swallowing, Excessive gas, Gets full quickly at meals, Hemorrhoids, Indigestion, Nausea, Rectal Pain and Vomiting. Male Genitourinary Not Present- Blood in Urine, Change in Urinary Stream, Frequency, Impotence, Nocturia, Painful Urination, Urgency and Urine Leakage. Musculoskeletal Not Present- Back Pain, Joint Pain, Joint Stiffness, Muscle Pain, Muscle Weakness and Swelling of Extremities. Neurological Not Present- Decreased Memory, Fainting, Headaches, Numbness, Seizures, Tingling, Tremor, Trouble walking and Weakness. Psychiatric Present- Anxiety. Not Present- Bipolar, Change in Sleep Pattern, Depression, Fearful and Frequent crying. Endocrine Not Present- Cold Intolerance, Excessive Hunger, Hair Changes, Heat Intolerance, Hot flashes and New Diabetes. Hematology Not Present- Blood Thinners, Easy Bruising, Excessive bleeding, Gland problems, HIV and Persistent Infections.  Vitals (Jose Harvey CMA; 03/08/2016 3:39 PM) 03/08/2016 3:39 PM Weight: 167 lb Height: 71in Body Surface Area: 1.95 m Body Mass Index: 23.29 kg/m  Temp.: 90F(Temporal)  Pulse: 81 (Regular)  BP: 128/80 (Sitting, Left Arm, Standard)      Physical Exam (Jose Weidinger A. Kae Heller MD; 03/08/2016 4:06 PM)  General Mental Status-Alert. General Appearance-Consistent with stated age. Hydration-Well hydrated. Voice-Normal.  Head and Neck Head-normocephalic, atraumatic with no  lesions or palpable masses. Trachea-midline. Thyroid Gland Characteristics - normal size and consistency.  Eye Eyeball - Bilateral-Extraocular movements intact. Sclera/Conjunctiva - Bilateral-No scleral icterus.  Chest and Lung Exam Chest and lung exam reveals -quiet, even and easy respiratory effort with no use of accessory muscles and on auscultation, normal breath sounds, no adventitious sounds and normal vocal resonance. Inspection Chest Wall - Normal. Back - normal.  Breast - Did not examine.  Cardiovascular Cardiovascular examination reveals -normal heart sounds, regular rate and rhythm with no murmurs and normal pedal pulses bilaterally.  Abdomen Inspection Inspection of the abdomen reveals - No Hernias. Skin - Scar - no surgical scars. Palpation/Percussion Palpation and Percussion of the abdomen reveal - Soft, Non Tender, No Rebound tenderness, No Rigidity (guarding) and No hepatosplenomegaly. Auscultation Auscultation of the abdomen reveals - Bowel sounds normal.  Neurologic Neurologic evaluation reveals -alert and oriented x 3 with no impairment of recent or remote memory. Mental Status-Normal.  Musculoskeletal Normal Exam - Left-Upper Extremity Strength Normal and Lower Extremity Strength Normal. Normal Exam - Right-Upper Extremity Strength Normal and Lower Extremity Strength Normal.  Lymphatic - Did not examine.    Assessment & Plan (Marshelle Bilger A. Jerell Demery MD; 03/08/2016 4:06 PM)  GALLBLADDER POLYP (Principal Diagnosis) (K82.4) Story: Measures 1 cm on recent MRI and is described as pedunculated. Reassuringly he had a PET scan that did not show any uptake. Given the size this does merit cholecystectomy. I would like to get an ultrasound gallbladder first just to further characterize the lesion and see if there are any other smaller lesions within the gallbladder. I discussed with him the small risk of gallbladder cancer as polyps increase in size, and  described laparoscopic cholecystectomy and the risks and benefits including but not limited to bleeding, infection, pain, scarring and injury to the common bile duct and its ramifications. We'll go ahead and plan for laparoscopic cholecystectomy following ultrasound. Laparoscopic cholecystectomy pamphlet provided.

## 2016-03-09 ENCOUNTER — Other Ambulatory Visit: Payer: Self-pay | Admitting: Surgery

## 2016-03-09 DIAGNOSIS — K824 Cholesterolosis of gallbladder: Secondary | ICD-10-CM

## 2016-03-13 ENCOUNTER — Encounter: Payer: Self-pay | Admitting: Cardiology

## 2016-03-13 ENCOUNTER — Ambulatory Visit (INDEPENDENT_AMBULATORY_CARE_PROVIDER_SITE_OTHER): Payer: BLUE CROSS/BLUE SHIELD | Admitting: Cardiology

## 2016-03-13 VITALS — BP 120/68 | HR 65 | Ht 71.0 in | Wt 165.0 lb

## 2016-03-13 DIAGNOSIS — I10 Essential (primary) hypertension: Secondary | ICD-10-CM | POA: Diagnosis not present

## 2016-03-13 DIAGNOSIS — R0609 Other forms of dyspnea: Secondary | ICD-10-CM

## 2016-03-13 DIAGNOSIS — E784 Other hyperlipidemia: Secondary | ICD-10-CM | POA: Diagnosis not present

## 2016-03-13 DIAGNOSIS — E782 Mixed hyperlipidemia: Secondary | ICD-10-CM | POA: Diagnosis not present

## 2016-03-13 DIAGNOSIS — R0600 Dyspnea, unspecified: Secondary | ICD-10-CM

## 2016-03-13 DIAGNOSIS — E7849 Other hyperlipidemia: Secondary | ICD-10-CM

## 2016-03-13 MED ORDER — PITAVASTATIN CALCIUM 2 MG PO TABS: 2.0000 mg | ORAL_TABLET | Freq: Every day | ORAL | 11 refills | Status: DC

## 2016-03-13 MED ORDER — EZETIMIBE 10 MG PO TABS: 10.0000 mg | ORAL_TABLET | Freq: Every day | ORAL | 11 refills | Status: DC

## 2016-03-13 NOTE — Patient Instructions (Signed)
Medication Instructions:   Your physician recommends that you continue on your current medications as directed. Please refer to the Current Medication list given to you today.    Labwork:  PRIOR TO YOUR ONE YEAR FOLLOW-UP APPOINTMENT WITH DR Meda Coffee TO CHECK A --CMET, CBC W DIFF, TSH, AND FASTING LIPIDS---PLEASE COME FASTING TO THIS LAB APPOINTMENT    Follow-Up:  Your physician wants you to follow-up in: [place] will receive a reminder letter in the mail two months in advance. If you don't receive a letter, please call our office to schedule the follow-up appointment.  PLEASE HAVE YOUR LABS DONE PRIOR TO THIS APPOINTMENT        If you need a refill on your cardiac medications before your next appointment, please call your pharmacy.

## 2016-03-13 NOTE — Progress Notes (Signed)
Patient ID: Jose Harvey, male   DOB: 02/11/1961, 55 y.o.   MRN: [id number]    Patient Name: Jose Harvey Date of Encounter: 03/13/2016  Primary Care Provider:  Antony Blackbird, MD Primary Cardiologist:  Ena Dawley  Problem List   Past Medical History:  Diagnosis Date  . Allergic rhinitis, cause unspecified   . Anxiety state, unspecified   . BPV (benign positional vertigo)   . Depressive disorder, not elsewhere classified   . GERD (gastroesophageal reflux disease)   . 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 Mixed hyperlipidemia   . 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 Type I (juvenile type) diabetes mellitus without mention of complication, not stated as uncontrolled   . Vesicoureteral reflux    No past surgical history on file.  Allergies  Allergies  Allergen Reactions  . Crestor [Rosuvastatin]     Muscle aches  . Lipitor [Atorvastatin]     Muscle aches  . Other Other (See Comments)    Any antihistamines Causes shaking  . Zocor [Simvastatin] Other (See Comments)    Muscle aches    HPI  A pleasant 55 year old gentleman with prior medical history of insulin-dependent diabetes mellitus diagnosed at age 87 currently on insulin pump, hypertension, hyperlipidemia who is coming with concern of exertion all dyspnea that he has noticed in the last year. On one occasion he was helping his son in law when he became significantly short of breath that persisted for a while. He has also noticed that activities that used to be easy making now short of breath. He bought a treadmill and starting February he has been exercising almost daily with some improvement in dyspnea on exertion. He denies any chest pain, jaw pain back pain or arm pain. He had 2 occasions when he woke up in the middle of the night feeling short of breath and sitting up would help. He denies lower extremity edema and states  that his left legs gets tired after walking longer distances. The patient quit smoking 5 years ago. He denies any palpitations or syncope. He has significant family history of premature coronary artery disease, his father had a coronary artery bypass graft surgery and passed away from a myocardial infarction at age of 48.  01/29/14 - the patient is feeling much better, he started to exercise - walking  [place] daily on his treadmill. He has no SOB on exertion but feels some heaviness at rest. He is a life long smoker. He is also complaining of throat fullness, ear pain and no difficulty swallowing.   03/13/2016  - this is 6 months follow-up the patient states that he has been very busy and stressed out as he is now taking care of his 76-year-old granddaughter is his daughter has drug problems. He is otherwise doing well his diabetes is under control, he is tolerating all of his medications, he has no side effects and his lipids have been under excellent control.  He has occasional chest pain mostly when he gets anxious but not exertional. No orthopnea paroxysmal nocturnal dyspnea no palpitations or syncope. He has undergone FDG PET for evaluation of lung nodules with no definitive finding of malignancy. He is scheduled for abdominal MRI for questionable polyp on his gallbladder. His interest is going to change in his cholesterol medication.  Home Medications  Prior to Admission medications   Medication Sig Start Date End Date Taking? Authorizing Provider  aspirin 81 MG tablet  Take 81 mg by mouth daily.   Yes Historical Provider, MD  glucagon (GLUCAGON EMERGENCY) 1 MG injection Inject 1 mg into the vein once as needed.   Yes Historical Provider, MD  insulin aspart (NOVOLOG) 100 UNIT/ML injection Inject into the skin 3 (three) times daily before meals. Sliding scale   Yes Historical Provider, MD  KRILL OIL PO Take by mouth daily.   Yes Historical Provider, MD  lisinopril (PRINIVIL,ZESTRIL) 5 MG tablet  Take 1 tablet by mouth daily. 09/09/13  Yes Historical Provider, MD  pantoprazole (PROTONIX) 40 MG tablet Take 1 tablet by mouth daily. 10/13/13  Yes Historical Provider, MD  sertraline (ZOLOFT) 50 MG tablet Take 50 mg by mouth daily.   Yes Historical Provider, MD    Family History  Family History  Problem Relation Age of Onset  . Diabetes type I Father   . Heart attack Father   . Hypertension Father   . Emphysema Father   . CAD Father   . Colon cancer Maternal Grandfather   . Emphysema Paternal Grandfather   . Diabetes type I Paternal Grandfather   . Kidney disease Neg Hx   . Liver disease Neg Hx     Social History  Social History   Social History  . Marital status: Legally Separated    Spouse name: N/A  . Number of children: N/A  . Years of education: N/A   Occupational History  . Not on file.   Social History Main Topics  . Smoking status: Former Smoker    Packs/day: 1.50    Years: 27.00    Types: Cigarettes    Quit date: 06/05/2008  . Smokeless tobacco: Former Systems developer    Types: Snuff    Quit date: 2015  . Alcohol use 3.6 oz/week    6 Cans of beer per week     Comment: occasional 6pk per week  . Drug use: No  . Sexual activity: Not on file   Other Topics Concern  . Not on file   Social History Narrative  . No narrative on file     Review of Systems, as per HPI, otherwise negative General:  No chills, fever, night sweats or weight changes.  Cardiovascular:  No chest pain, dyspnea on exertion, edema, orthopnea, palpitations, paroxysmal nocturnal dyspnea. Dermatological: No rash, lesions/masses Respiratory: No cough, dyspnea Urologic: No hematuria, dysuria Abdominal:   No nausea, vomiting, diarrhea, bright red blood per rectum, melena, or hematemesis Neurologic:  No visual changes, wkns, changes in mental status. All other systems reviewed and are otherwise negative except as noted above.  Physical Exam  Blood pressure 120/68, pulse 65, height 5\' 11"  (1.803  m), weight 165 lb (74.8 kg).  General: Pleasant, NAD Psych: Normal affect. Neuro: Alert and oriented X 3. Moves all extremities spontaneously. HEENT: Normal  Neck: Supple without bruits or JVD. Lungs:  Resp regular and unlabored, CTA. Heart: RRR no s3, s4, or murmurs. Abdomen: Soft, non-tender, non-distended, BS + x 4.  Extremities: No clubbing, cyanosis or edema. DP/PT/Radials 2+ and equal bilaterally.  Labs: Triglycerides 90, HDL 52, LDL 149, hemoglobin A1c 7.3%, glucose 162, creatinine 0.9, potassium 4.1, AST 20, ALT 23,  Accessory Clinical Findings  Stress Echocardiogram - 12/26/2013 - Stress ECG conclusions: There were no stress arrhythmias or conduction abnormalities. The stress ECG was normal. - Staged echo: There was no echocardiographic evidence for stress-induced ischemia.  Impressions: - Stress echocardiogram with no chest pain, no ST changes and no stress-induced wall motion abnormalites; normal  stress echo.  ECG - normal sinus rhythm, 65 bpm, normal EKG.  Assessment & Plan  55 year old gentleman with type 1 diabetes on insulin pump, hypertension hyperlipidemia and family history of premature coronary artery disease who is symptomatic with   1. Dyspnea on exertion - improved, Normal LVEF, normal hyperdynamic response to stress. Appears euvolemic. Negative stress test and significant improvement after starting exercise. He is now exercising but they're busy with his granddaughter. He has h/o COPD on Symbicort.    2. Hyperlipidemia - LDL 149, goal less than 70, patient significantly intolerance to Lipitor, Crestor and Zocor with prior arm joint pain, blisters. Started on Livalo and Zetia with significant improvement with most recent LDL 69. We will repeat his lipids prior to next year appointment.  3. HTN  - controlled  4. Insulin-dependent diabetes mellitus - since age of 49 currently on insulin pump, hemoglobin A1c 7.3%, therefore we need to be aggressive with  cholesterol and blood pressure management to avoid any atherosclerosis.  5. COPD - on Symbicort.  6. Subjective bruit in the neck - normal carotid ultrasound in 2015.  Followup in 1 year.  Ena Dawley, MD, [facility] 03/13/2016, 8:30 AM

## 2016-03-23 ENCOUNTER — Ambulatory Visit
Admission: RE | Admit: 2016-03-23 | Discharge: 2016-03-23 | Disposition: A | Discharge status: Home / Self Care | Payer: BLUE CROSS/BLUE SHIELD | Source: Ambulatory Visit | Attending: Surgery | Admitting: Surgery

## 2016-03-23 DIAGNOSIS — K824 Cholesterolosis of gallbladder: Secondary | ICD-10-CM

## 2016-05-09 NOTE — Patient Instructions (Signed)
Jose Harvey  05/09/2016   Your procedure is scheduled on: 05/17/2016    Report to [facility] Main  Entrance take [place]  elevators to 3rd floor to  [place] at    [place] AM.  Call this number if you have problems the morning of surgery [phone]   Remember: ONLY 1 PERSON MAY GO WITH YOU TO SHORT STAY TO GET  READY MORNING OF [place].  Do not eat food or drink liquids :After Midnight.     Take these medicines the morning of surgery with A SIP OF WATER: Symbicort inhaler and bring, Dexilant DO NOT TAKE ANY DIABETIC MEDICATIONS DAY OF YOUR SURGERY                               You may not have any metal on your body including hair pins and              piercings  Do not wear jewelry,  lotions, powders or perfumes, deodorant                         Men may shave face and neck.   Do not bring valuables to the hospital. [place].  Contacts, dentures or bridgework may not be worn into surgery.  Leave suitcase in the car. After surgery it may be brought to your room.     Patients discharged the day of surgery will not be allowed to drive home.  Name and phone number of your driver:  Special Instructions: N/A              Please read over the following fact sheets you were given: _____________________________________________________________________             [facility] - Preparing for Surgery Before surgery, you can play an important role.  Because skin is not sterile, your skin needs to be as free of germs as possible.  You can reduce the number of germs on your skin by washing with CHG (chlorahexidine gluconate) soap before surgery.  CHG is an antiseptic cleaner which kills germs and bonds with the skin to continue killing germs even after washing. Please DO NOT use if you have an allergy to CHG or antibacterial soaps.  If your skin becomes reddened/irritated stop using the CHG and inform  your nurse when you arrive at Short Stay. Do not shave (including legs and underarms) for at least 48 hours prior to the first CHG shower.  You may shave your face/neck. Please follow these instructions carefully:  1.  Shower with CHG Soap the night before surgery and the  morning of Surgery.  2.  If you choose to wash your hair, wash your hair first as usual with your  normal  shampoo.  3.  After you shampoo, rinse your hair and body thoroughly to remove the  shampoo.                           4.  Use CHG as you would any other liquid soap.  You can apply chg directly  to the skin and wash  Gently with a scrungie or clean washcloth.  5.  Apply the CHG Soap to your body ONLY FROM THE NECK DOWN.   Do not use on face/ open                           Wound or open sores. Avoid contact with eyes, ears mouth and genitals (private parts).                       Wash face,  Genitals (private parts) with your normal soap.             6.  Wash thoroughly, paying special attention to the area where your surgery  will be performed.  7.  Thoroughly rinse your body with warm water from the neck down.  8.  DO NOT shower/wash with your normal soap after using and rinsing off  the CHG Soap.                9.  Pat yourself dry with a clean towel.            10.  Wear clean pajamas.            11.  Place clean sheets on your bed the night of your first shower and do not  sleep with pets. Day of Surgery : Do not apply any lotions/deodorants the morning of surgery.  Please wear clean clothes to the hospital/surgery center.  FAILURE TO FOLLOW THESE INSTRUCTIONS MAY RESULT IN THE CANCELLATION OF YOUR SURGERY PATIENT SIGNATURE_________________________________  NURSE SIGNATURE__________________________________  ________________________________________________________________________ How to Manage Your Diabetes Before and After Surgery  Why is it important to control my blood sugar before and  after surgery? . Improving blood sugar levels before and after surgery helps healing and can limit problems. . A way of improving blood sugar control is eating a healthy diet by: o  Eating less sugar and carbohydrates o  Increasing activity/exercise o  Talking with your doctor about reaching your blood sugar goals . High blood sugars (greater than 180 mg/dL) can raise your risk of infections and slow your recovery, so you will need to focus on controlling your diabetes during the weeks before surgery. . Make sure that the doctor who takes care of your diabetes knows about your planned surgery including the date and location.  How do I manage my blood sugar before surgery? . Check your blood sugar at least 4 times a day, starting 2 days before surgery, to make sure that the level is not too high or low. o Check your blood sugar the morning of your surgery when you wake up and every 2 hours until you get to the Short Stay unit. . If your blood sugar is less than 70 mg/dL, you will need to treat for low blood sugar: o Do not take insulin. o Treat a low blood sugar (less than 70 mg/dL) with  cup of clear juice (cranberry or apple), 4 glucose tablets, OR glucose gel. o Recheck blood sugar in 15 minutes after treatment (to make sure it is greater than 70 mg/dL). If your blood sugar is not greater than 70 mg/dL on recheck, call [phone] for further instructions. . Report your blood sugar to the short stay nurse when you get to Short Stay.  . If you are admitted to the hospital after surgery: o Your blood sugar will be checked by the staff and you will probably be  given insulin after surgery (instead of oral diabetes medicines) to make sure you have good blood sugar levels. o The goal for blood sugar control after surgery is 80-180 mg/dL.   WHAT DO I DO ABOUT MY DIABETES MEDICATION?  .   .        .    .  .     For patients with insulin pumps: Contact your diabetes doctor for  specific instructions before surgery. Decrease basal rates by 20% at midnight the night before your surgery. Note that if your surgery is planned to be longer than 2 hours, your insulin pump will be removed and intravenous (IV) insulin will be started and managed by the nurses and the anesthesiologist. You will be able to restart your insulin pump once you are awake and able to manage it.  Make sure to bring insulin pump supplies to the hospital with you in case the  site needs to be changed.  Patient Signature:  Date:   Nurse Signature:  Date:   Reviewed and Endorsed by [facility] Patient Education Committee, August 2015

## 2016-05-11 ENCOUNTER — Encounter (HOSPITAL_COMMUNITY): Payer: Self-pay

## 2016-05-11 ENCOUNTER — Encounter (HOSPITAL_COMMUNITY)
Admission: RE | Admit: 2016-05-11 | Discharge: 2016-05-11 | Disposition: A | Discharge status: Home / Self Care | Payer: BLUE CROSS/BLUE SHIELD | Source: Ambulatory Visit | Attending: Surgery | Admitting: Surgery

## 2016-05-11 ENCOUNTER — Encounter (INDEPENDENT_AMBULATORY_CARE_PROVIDER_SITE_OTHER): Payer: Self-pay

## 2016-05-11 DIAGNOSIS — E119 Type 2 diabetes mellitus without complications: Secondary | ICD-10-CM | POA: Diagnosis not present

## 2016-05-11 DIAGNOSIS — Z794 Long term (current) use of insulin: Secondary | ICD-10-CM | POA: Insufficient documentation

## 2016-05-11 DIAGNOSIS — I10 Essential (primary) hypertension: Secondary | ICD-10-CM | POA: Insufficient documentation

## 2016-05-11 DIAGNOSIS — E785 Hyperlipidemia, unspecified: Secondary | ICD-10-CM | POA: Diagnosis not present

## 2016-05-11 DIAGNOSIS — Z01810 Encounter for preprocedural cardiovascular examination: Secondary | ICD-10-CM | POA: Diagnosis present

## 2016-05-11 DIAGNOSIS — Z01812 Encounter for preprocedural laboratory examination: Secondary | ICD-10-CM | POA: Diagnosis present

## 2016-05-11 DIAGNOSIS — K824 Cholesterolosis of gallbladder: Secondary | ICD-10-CM | POA: Insufficient documentation

## 2016-05-11 HISTORY — DX: Personal history of urinary calculi: Z87.442

## 2016-05-11 LAB — CBC WITH DIFFERENTIAL/PLATELET
Basophils Absolute: 0.1 10*3/uL (ref 0.0–0.1)
Basophils Relative: 1 %
Eosinophils Absolute: 0.5 10*3/uL (ref 0.0–0.7)
Eosinophils Relative: 6 %
HEMATOCRIT: 42.7 % (ref 39.0–52.0)
HEMOGLOBIN: 14.3 g/dL (ref 13.0–17.0)
LYMPHS ABS: 1.8 10*3/uL (ref 0.7–4.0)
LYMPHS PCT: 24 %
MCH: 30.2 pg (ref 26.0–34.0)
MCHC: 33.5 g/dL (ref 30.0–36.0)
MCV: 90.3 fL (ref 78.0–100.0)
Monocytes Absolute: 0.5 10*3/uL (ref 0.1–1.0)
Monocytes Relative: 7 %
NEUTROS ABS: 4.8 10*3/uL (ref 1.7–7.7)
NEUTROS PCT: 62 %
Platelets: 277 10*3/uL (ref 150–400)
RBC: 4.73 MIL/uL (ref 4.22–5.81)
RDW: 13.1 % (ref 11.5–15.5)
WBC: 7.7 10*3/uL (ref 4.0–10.5)

## 2016-05-11 LAB — COMPREHENSIVE METABOLIC PANEL
ALK PHOS: 71 U/L (ref 38–126)
ALT: 32 U/L (ref 17–63)
AST: 25 U/L (ref 15–41)
Albumin: 4.2 g/dL (ref 3.5–5.0)
Anion gap: 8 (ref 5–15)
BUN: 25 mg/dL — ABNORMAL HIGH (ref 6–20)
CALCIUM: 9.1 mg/dL (ref 8.9–10.3)
CO2: 25 mmol/L (ref 22–32)
CREATININE: 0.86 mg/dL (ref 0.61–1.24)
Chloride: 105 mmol/L (ref 101–111)
Glucose, Bld: 115 mg/dL — ABNORMAL HIGH (ref 65–99)
Potassium: 4 mmol/L (ref 3.5–5.1)
SODIUM: 138 mmol/L (ref 135–145)
Total Bilirubin: 0.6 mg/dL (ref 0.3–1.2)
Total Protein: 6.8 g/dL (ref 6.5–8.1)

## 2016-05-11 LAB — GLUCOSE, CAPILLARY: GLUCOSE-CAPILLARY: 111 mg/dL — AB (ref 65–99)

## 2016-05-11 LAB — APTT: aPTT: 27 seconds (ref 24–36)

## 2016-05-11 LAB — PROTIME-INR
INR: 0.96
Prothrombin Time: 12.7 seconds (ref 11.4–15.2)

## 2016-05-11 NOTE — Progress Notes (Signed)
Patient called after appointment with Dr Buddy Duty ( manager of Insulin pump) and stated per Dr Buddy Duty for patient not to make any changes regarding Insulin pump settings.   Patient stated his basal rate for Insulin pump was at 12 midnite at .6, at 0500am-.8 and at 0800am-.6  .

## 2016-05-11 NOTE — Progress Notes (Signed)
Diabetic Coordinator Almyra Free) notified on 05/11/16.  Made aware of surgery date, time and arrival time and Dr Buddy Duty is MD and that patient has insulin pump ( basic) on preop instructions and Dr Buddy Duty to see pt on 05/11/16.  Patient will call nurse if Dr Buddy Duty gives him any further instructions.  Patient signed Insulin Pump contract which is on chart and patient has copy  Of Insulin Pump Contract.

## 2016-05-11 NOTE — Progress Notes (Signed)
CMP done 05/11/16 faxed via EPIC to Dr Kae Heller.

## 2016-05-11 NOTE — Progress Notes (Signed)
Final EKG done 07/02/15- EPIC  

## 2016-05-11 NOTE — Progress Notes (Signed)
EKG-03/13/16- EPIC  03/13/16- LOV- cardiology- epic  2015- ECHO-ep[ic  11/05/15- CXR- epic  02/29/16- LOV- pulm in epic  LOV- Dr Buddy Duty- 01/28/16- on chart

## 2016-05-12 LAB — HEMOGLOBIN A1C
Hgb A1c MFr Bld: 6.8 % — ABNORMAL HIGH (ref 4.8–5.6)
Mean Plasma Glucose: 148 mg/dL

## 2016-05-16 NOTE — Anesthesia Preprocedure Evaluation (Addendum)
Anesthesia Evaluation  Patient identified by MRN, date of birth, ID band Patient awake    Reviewed: Allergy & Precautions, NPO status , Patient's Chart, lab work & pertinent test results  Airway Mallampati: II  TM Distance: >3 FB Neck ROM: Full    Dental  (+) Teeth Intact, Dental Advisory Given   Pulmonary COPD, former smoker,    breath sounds clear to auscultation       Cardiovascular hypertension, Pt. on medications + DOE   Rhythm:Regular Rate:Normal     Neuro/Psych Anxiety Depression negative neurological ROS     GI/Hepatic Neg liver ROS, GERD  ,  Endo/Other  diabetes, Type 1, Insulin Dependent  Renal/GU negative Renal ROS     Musculoskeletal   Abdominal   Peds  Hematology negative hematology ROS (+)   Anesthesia Other Findings   Reproductive/Obstetrics                           Lab Results  Component Value Date   WBC 7.7 05/11/2016   HGB 14.3 05/11/2016   HCT 42.7 05/11/2016   MCV 90.3 05/11/2016   PLT 277 05/11/2016   Lab Results  Component Value Date   CREATININE 0.86 05/11/2016   BUN 25 (H) 05/11/2016   NA 138 05/11/2016   K 4.0 05/11/2016   CL 105 05/11/2016   CO2 25 05/11/2016    Anesthesia Physical Anesthesia Plan  ASA: II  Anesthesia Plan: General   Post-op Pain Management:    Induction: Intravenous  Airway Management Planned: Oral ETT  Additional Equipment:   Intra-op Plan:   Post-operative Plan: Extubation in OR  Informed Consent:   Plan Discussed with:   Anesthesia Plan Comments:         Anesthesia Quick Evaluation

## 2016-05-17 ENCOUNTER — Ambulatory Visit (HOSPITAL_COMMUNITY)
Admission: RE | Admit: 2016-05-17 | Discharge: 2016-05-17 | Disposition: A | Discharge status: Home / Self Care | Payer: BLUE CROSS/BLUE SHIELD | Source: Ambulatory Visit | Attending: Surgery | Admitting: Surgery

## 2016-05-17 ENCOUNTER — Encounter (HOSPITAL_COMMUNITY): Payer: Self-pay | Admitting: Anesthesiology

## 2016-05-17 ENCOUNTER — Ambulatory Visit (HOSPITAL_COMMUNITY): Payer: BLUE CROSS/BLUE SHIELD | Admitting: Anesthesiology

## 2016-05-17 ENCOUNTER — Encounter (HOSPITAL_COMMUNITY)
Admission: RE | Disposition: A | Discharge status: Home / Self Care | Payer: Self-pay | Source: Ambulatory Visit | Attending: Surgery

## 2016-05-17 DIAGNOSIS — I10 Essential (primary) hypertension: Secondary | ICD-10-CM | POA: Insufficient documentation

## 2016-05-17 DIAGNOSIS — Z794 Long term (current) use of insulin: Secondary | ICD-10-CM | POA: Insufficient documentation

## 2016-05-17 DIAGNOSIS — Z888 Allergy status to other drugs, medicaments and biological substances status: Secondary | ICD-10-CM | POA: Diagnosis not present

## 2016-05-17 DIAGNOSIS — K824 Cholesterolosis of gallbladder: Secondary | ICD-10-CM | POA: Diagnosis present

## 2016-05-17 DIAGNOSIS — Z79899 Other long term (current) drug therapy: Secondary | ICD-10-CM | POA: Diagnosis not present

## 2016-05-17 DIAGNOSIS — Z8249 Family history of ischemic heart disease and other diseases of the circulatory system: Secondary | ICD-10-CM | POA: Insufficient documentation

## 2016-05-17 DIAGNOSIS — Z833 Family history of diabetes mellitus: Secondary | ICD-10-CM | POA: Insufficient documentation

## 2016-05-17 DIAGNOSIS — Z8261 Family history of arthritis: Secondary | ICD-10-CM | POA: Diagnosis not present

## 2016-05-17 DIAGNOSIS — E785 Hyperlipidemia, unspecified: Secondary | ICD-10-CM | POA: Insufficient documentation

## 2016-05-17 DIAGNOSIS — Z9641 Presence of insulin pump (external) (internal): Secondary | ICD-10-CM | POA: Diagnosis not present

## 2016-05-17 DIAGNOSIS — E119 Type 2 diabetes mellitus without complications: Secondary | ICD-10-CM | POA: Insufficient documentation

## 2016-05-17 DIAGNOSIS — Z87891 Personal history of nicotine dependence: Secondary | ICD-10-CM | POA: Insufficient documentation

## 2016-05-17 DIAGNOSIS — J449 Chronic obstructive pulmonary disease, unspecified: Secondary | ICD-10-CM | POA: Insufficient documentation

## 2016-05-17 DIAGNOSIS — Z800 Family history of malignant neoplasm of digestive organs: Secondary | ICD-10-CM | POA: Diagnosis not present

## 2016-05-17 DIAGNOSIS — K811 Chronic cholecystitis: Secondary | ICD-10-CM | POA: Diagnosis not present

## 2016-05-17 DIAGNOSIS — Z811 Family history of alcohol abuse and dependence: Secondary | ICD-10-CM | POA: Insufficient documentation

## 2016-05-17 DIAGNOSIS — F419 Anxiety disorder, unspecified: Secondary | ICD-10-CM | POA: Insufficient documentation

## 2016-05-17 HISTORY — PX: CHOLECYSTECTOMY: SHX55

## 2016-05-17 LAB — GLUCOSE, CAPILLARY
GLUCOSE-CAPILLARY: 215 mg/dL — AB (ref 65–99)
GLUCOSE-CAPILLARY: 87 mg/dL (ref 65–99)
GLUCOSE-CAPILLARY: 89 mg/dL (ref 65–99)
Glucose-Capillary: 107 mg/dL — ABNORMAL HIGH (ref 65–99)
Glucose-Capillary: 100 mg/dL — ABNORMAL HIGH (ref 65–99)

## 2016-05-17 SURGERY — LAPAROSCOPIC CHOLECYSTECTOMY
Anesthesia: General

## 2016-05-17 MED ORDER — PHENYLEPHRINE 40 MCG/ML (10ML) SYRINGE FOR IV PUSH (FOR BLOOD PRESSURE SUPPORT)
PREFILLED_SYRINGE | INTRAVENOUS | Status: DC | PRN
  Administered 2016-05-17 (×3): 80 ug via INTRAVENOUS

## 2016-05-17 MED ORDER — OXYCODONE-ACETAMINOPHEN 5-325 MG PO TABS: 1.0000 | ORAL_TABLET | Freq: Four times a day (QID) | ORAL | 0 refills | Status: DC | PRN

## 2016-05-17 MED ORDER — HYDROMORPHONE HCL 1 MG/ML IJ SOLN
0.2500 mg | INTRAMUSCULAR | Status: DC | PRN
Start: 2016-05-17 — End: 2016-05-17

## 2016-05-17 MED ORDER — FENTANYL CITRATE (PF) 100 MCG/2ML IJ SOLN
INTRAMUSCULAR | Status: DC | PRN
  Administered 2016-05-17: 100 ug via INTRAVENOUS
  Administered 2016-05-17: 50 ug via INTRAVENOUS

## 2016-05-17 MED ORDER — CHLORHEXIDINE GLUCONATE CLOTH 2 % EX PADS: 6.0000 | MEDICATED_PAD | Freq: Once | CUTANEOUS | Status: DC

## 2016-05-17 MED ORDER — PROPOFOL 10 MG/ML IV BOLUS
INTRAVENOUS | Status: AC
  Filled 2016-05-17: qty 20

## 2016-05-17 MED ORDER — OXYCODONE HCL 5 MG PO TABS: 5.0000 mg | ORAL_TABLET | ORAL | Status: DC | PRN

## 2016-05-17 MED ORDER — LACTATED RINGERS IV SOLN
INTRAVENOUS | Status: DC
  Administered 2016-05-17: 11:00:00 via INTRAVENOUS

## 2016-05-17 MED ORDER — SUCCINYLCHOLINE CHLORIDE 200 MG/10ML IV SOSY
PREFILLED_SYRINGE | INTRAVENOUS | Status: AC
  Filled 2016-05-17: qty 10

## 2016-05-17 MED ORDER — ACETAMINOPHEN 325 MG PO TABS: 650.0000 mg | ORAL_TABLET | ORAL | Status: DC | PRN

## 2016-05-17 MED ORDER — ONDANSETRON HCL 4 MG/2ML IJ SOLN
INTRAMUSCULAR | Status: AC
Start: 2016-05-17 — End: 2016-05-17
  Filled 2016-05-17: qty 2

## 2016-05-17 MED ORDER — BUPIVACAINE HCL (PF) 0.25 % IJ SOLN
INTRAMUSCULAR | Status: AC
  Filled 2016-05-17: qty 30

## 2016-05-17 MED ORDER — LIDOCAINE 2% (20 MG/ML) 5 ML SYRINGE
INTRAMUSCULAR | Status: AC
  Filled 2016-05-17: qty 5

## 2016-05-17 MED ORDER — ROCURONIUM BROMIDE 10 MG/ML (PF) SYRINGE
PREFILLED_SYRINGE | INTRAVENOUS | Status: DC | PRN
  Administered 2016-05-17: 5 mg via INTRAVENOUS
  Administered 2016-05-17: 40 mg via INTRAVENOUS

## 2016-05-17 MED ORDER — SODIUM CHLORIDE 0.9% FLUSH: 3.0000 mL | Freq: Two times a day (BID) | INTRAVENOUS | Status: DC

## 2016-05-17 MED ORDER — HEPARIN SODIUM (PORCINE) 5000 UNIT/ML IJ SOLN
5000.0000 [IU] | Freq: Once | INTRAMUSCULAR | Status: AC
  Administered 2016-05-17: 5000 [IU] via SUBCUTANEOUS
  Filled 2016-05-17: qty 1

## 2016-05-17 MED ORDER — SUGAMMADEX SODIUM 200 MG/2ML IV SOLN
INTRAVENOUS | Status: DC | PRN
  Administered 2016-05-17: 160 mg via INTRAVENOUS

## 2016-05-17 MED ORDER — MIDAZOLAM HCL 2 MG/2ML IJ SOLN
INTRAMUSCULAR | Status: AC
  Filled 2016-05-17: qty 2

## 2016-05-17 MED ORDER — DOCUSATE SODIUM 100 MG PO CAPS: 100.0000 mg | ORAL_CAPSULE | Freq: Two times a day (BID) | ORAL | 0 refills | Status: AC

## 2016-05-17 MED ORDER — SODIUM CHLORIDE 0.9% FLUSH: 3.0000 mL | INTRAVENOUS | Status: DC | PRN

## 2016-05-17 MED ORDER — PROPOFOL 10 MG/ML IV BOLUS
INTRAVENOUS | Status: DC | PRN
  Administered 2016-05-17: 150 mg via INTRAVENOUS

## 2016-05-17 MED ORDER — LACTATED RINGERS IV SOLN
INTRAVENOUS | Status: DC | PRN
  Administered 2016-05-17: 08:00:00 via INTRAVENOUS

## 2016-05-17 MED ORDER — ONDANSETRON HCL 4 MG/2ML IJ SOLN
INTRAMUSCULAR | Status: DC | PRN
  Administered 2016-05-17: 4 mg via INTRAVENOUS

## 2016-05-17 MED ORDER — MIDAZOLAM HCL 2 MG/2ML IJ SOLN
INTRAMUSCULAR | Status: DC | PRN
  Administered 2016-05-17: 2 mg via INTRAVENOUS

## 2016-05-17 MED ORDER — ACETAMINOPHEN 650 MG RE SUPP
650.0000 mg | RECTAL | Status: DC | PRN
  Filled 2016-05-17: qty 1

## 2016-05-17 MED ORDER — HYDROMORPHONE HCL 2 MG/ML IJ SOLN: 0.2500 mg | INTRAMUSCULAR | Status: DC | PRN

## 2016-05-17 MED ORDER — CEFAZOLIN SODIUM-DEXTROSE 2-4 GM/100ML-% IV SOLN
INTRAVENOUS | Status: AC
  Filled 2016-05-17: qty 100

## 2016-05-17 MED ORDER — CEFAZOLIN SODIUM-DEXTROSE 2-4 GM/100ML-% IV SOLN
2.0000 g | INTRAVENOUS | Status: AC
  Administered 2016-05-17: 2 g via INTRAVENOUS
  Filled 2016-05-17: qty 100

## 2016-05-17 MED ORDER — SUGAMMADEX SODIUM 200 MG/2ML IV SOLN
INTRAVENOUS | Status: AC
  Filled 2016-05-17: qty 2

## 2016-05-17 MED ORDER — LIDOCAINE 2% (20 MG/ML) 5 ML SYRINGE
INTRAMUSCULAR | Status: DC | PRN
  Administered 2016-05-17: 100 mg via INTRAVENOUS

## 2016-05-17 MED ORDER — SUCCINYLCHOLINE CHLORIDE 200 MG/10ML IV SOSY
PREFILLED_SYRINGE | INTRAVENOUS | Status: DC | PRN
Start: 2016-05-17 — End: 2016-05-17
  Administered 2016-05-17: 120 mg via INTRAVENOUS

## 2016-05-17 MED ORDER — FENTANYL CITRATE (PF) 100 MCG/2ML IJ SOLN: 25.0000 ug | INTRAMUSCULAR | Status: DC | PRN

## 2016-05-17 MED ORDER — SODIUM CHLORIDE 0.9 % IV SOLN: 250.0000 mL | INTRAVENOUS | Status: DC | PRN

## 2016-05-17 MED ORDER — ROCURONIUM BROMIDE 50 MG/5ML IV SOSY
PREFILLED_SYRINGE | INTRAVENOUS | Status: AC
Start: 2016-05-17 — End: 2016-05-17
  Filled 2016-05-17: qty 5

## 2016-05-17 MED ORDER — BUPIVACAINE-EPINEPHRINE 0.25% -1:200000 IJ SOLN
INTRAMUSCULAR | Status: DC | PRN
  Administered 2016-05-17: 25 mL

## 2016-05-17 MED ORDER — PROMETHAZINE HCL 25 MG/ML IJ SOLN: 6.2500 mg | INTRAMUSCULAR | Status: DC | PRN

## 2016-05-17 MED ORDER — FENTANYL CITRATE (PF) 250 MCG/5ML IJ SOLN
INTRAMUSCULAR | Status: AC
  Filled 2016-05-17: qty 5

## 2016-05-17 MED ORDER — LACTATED RINGERS IR SOLN
Status: DC | PRN
  Administered 2016-05-17: 1

## 2016-05-17 SURGICAL SUPPLY — 38 items
ADH SKN CLS APL DERMABOND .7 (GAUZE/BANDAGES/DRESSINGS) ×1
APPLIER CLIP ROT 10 11.4 M/L (STAPLE) ×2
APR CLP MED LRG 11.4X10 (STAPLE) ×1
BAG SPEC RTRVL LRG 6X4 10 (ENDOMECHANICALS) ×1
CABLE HIGH FREQUENCY MONO STRZ (ELECTRODE) ×2 IMPLANT
CHLORAPREP W/TINT 26ML (MISCELLANEOUS) ×2 IMPLANT
CLIP APPLIE ROT 10 11.4 M/L (STAPLE) ×1 IMPLANT
COVER MAYO STAND STRL (DRAPES) IMPLANT
COVER SURGICAL LIGHT HANDLE (MISCELLANEOUS) ×2 IMPLANT
DECANTER SPIKE VIAL GLASS SM (MISCELLANEOUS) ×2 IMPLANT
DERMABOND ADVANCED (GAUZE/BANDAGES/DRESSINGS) ×1
DERMABOND ADVANCED .7 DNX12 (GAUZE/BANDAGES/DRESSINGS) ×1 IMPLANT
DEVICE PMI PUNCTURE CLOSURE (MISCELLANEOUS) ×2 IMPLANT
DRAPE C-ARM 42X120 X-RAY (DRAPES) IMPLANT
ELECT REM PT RETURN 9FT ADLT (ELECTROSURGICAL) ×2
ELECTRODE REM PT RTRN 9FT ADLT (ELECTROSURGICAL) ×1 IMPLANT
ENDOLOOP SUT PDS II  0 18 (SUTURE) ×1
ENDOLOOP SUT PDS II 0 18 (SUTURE) IMPLANT
GLOVE BIO SURGEON STRL SZ 6 (GLOVE) ×2 IMPLANT
GLOVE INDICATOR 6.5 STRL GRN (GLOVE) ×2 IMPLANT
GOWN STRL REUS W/TWL LRG LVL3 (GOWN DISPOSABLE) ×2 IMPLANT
GOWN STRL REUS W/TWL XL LVL3 (GOWN DISPOSABLE) ×4 IMPLANT
HEMOSTAT SNOW SURGICEL 2X4 (HEMOSTASIS) IMPLANT
IRRIG SUCT STRYKERFLOW 2 WTIP (MISCELLANEOUS) ×2
IRRIGATION SUCT STRKRFLW 2 WTP (MISCELLANEOUS) ×1 IMPLANT
KIT BASIN OR (CUSTOM PROCEDURE TRAY) ×2 IMPLANT
NEEDLE INSUFFLATION 14GA 120MM (NEEDLE) ×2 IMPLANT
POUCH SPECIMEN RETRIEVAL 10MM (ENDOMECHANICALS) ×2 IMPLANT
SCISSORS LAP 5X35 DISP (ENDOMECHANICALS) ×2 IMPLANT
SET CHOLANGIOGRAPH MIX (MISCELLANEOUS) IMPLANT
SLEEVE XCEL OPT CAN 5 100 (ENDOMECHANICALS) ×4 IMPLANT
SUT MNCRL AB 4-0 PS2 18 (SUTURE) ×2 IMPLANT
TOWEL OR 17X26 10 PK STRL BLUE (TOWEL DISPOSABLE) ×2 IMPLANT
TOWEL OR NON WOVEN STRL DISP B (DISPOSABLE) IMPLANT
TRAY LAPAROSCOPIC (CUSTOM PROCEDURE TRAY) ×2 IMPLANT
TROCAR BLADELESS OPT 5 100 (ENDOMECHANICALS) ×2 IMPLANT
TROCAR XCEL 12X100 BLDLESS (ENDOMECHANICALS) ×2 IMPLANT
TUBING INSUF HEATED (TUBING) ×2 IMPLANT

## 2016-05-17 NOTE — Progress Notes (Signed)
Dr. Orene Desanctis made aware of patient's CBG results in PACU-87- by Judith Part, R.N.- to eat and drink in Short Stay and apply Insulin pump there-per Dr. Orene Desanctis

## 2016-05-17 NOTE — Discharge Instructions (Signed)
General Anesthesia, Adult, Care After These instructions provide you with information about caring for yourself after your procedure. Your health care provider may also give you more specific instructions. Your treatment has been planned according to current medical practices, but problems sometimes occur. Call your health care provider if you have any problems or questions after your procedure. What can I expect after the procedure? After the procedure, it is common to have:  Vomiting.  A sore throat.  Mental slowness. It is common to feel:  Nauseous.  Cold or shivery.  Sleepy.  Tired.  Sore or achy, even in parts of your body where you did not have surgery. Follow these instructions at home: For at least 24 hours after the procedure:  Do not:  Participate in activities where you could fall or become injured.  Drive.  Use heavy machinery.  Drink alcohol.  Take sleeping pills or medicines that cause drowsiness.  Make important decisions or sign legal documents.  Take care of children on your own.  Rest. Eating and drinking  If you vomit, drink water, juice, or soup when you can drink without vomiting.  Drink enough fluid to keep your urine clear or pale yellow.  Make sure you have little or no nausea before eating solid foods.  Follow the diet recommended by your health care provider. General instructions  Have a responsible adult stay with you until you are awake and alert.  Return to your normal activities as told by your health care provider. Ask your health care provider what activities are safe for you.  Take over-the-counter and prescription medicines only as told by your health care provider.  If you smoke, do not smoke without supervision.  Keep all follow-up visits as told by your health care provider. This is important. Contact a health care provider if:  You continue to have nausea or vomiting at home, and medicines are not helpful.  You  cannot drink fluids or start eating again.  You cannot urinate after 8-12 hours.  You develop a skin rash.  You have fever.  You have increasing redness at the site of your procedure. Get help right away if:  You have difficulty breathing.  You have chest pain.  You have unexpected bleeding.  You feel that you are having a life-threatening or urgent problem. This information is not intended to replace advice given to you by your health care provider. Make sure you discuss any questions you have with your health care provider. Document Released: 08/28/2000 Document Revised: 10/25/2015 Document Reviewed: 05/06/2015 Elsevier Interactive Patient Education  2017 [place] ______[employer] LAPAROSCOPIC SURGERY: POST OP INSTRUCTIONS Always review your discharge instruction sheet given to you by the facility where your surgery was performed. IF YOU HAVE DISABILITY OR FAMILY LEAVE FORMS, YOU MUST BRING THEM TO THE OFFICE FOR PROCESSING.   DO NOT GIVE THEM TO YOUR DOCTOR.  1. A prescription for pain medication may be given to you upon discharge.  Take your pain medication as prescribed, if needed.  If narcotic pain medicine is not needed, then you may take acetaminophen (Tylenol) or ibuprofen (Advil) as needed. 2. Take your usually prescribed medications unless otherwise directed. 3. If you need a refill on your pain medication, please contact your pharmacy.  They will contact our office to request authorization. Prescriptions will not be filled after 5pm or on week-ends. 4. You should follow a light diet the first few days after arrival home, such as soup and crackers,  etc.  Be sure to include lots of fluids daily. 5. Most patients will experience some swelling and bruising in the area of the incisions.  Ice packs will help.  Swelling and bruising can take several days to resolve.  6. It is common to experience some constipation if taking pain medication after  surgery.  Increasing fluid intake and taking a stool softener (such as Colace) will usually help or prevent this problem from occurring.  A mild laxative (Milk of Magnesia or Miralax) should be taken according to package instructions if there are no bowel movements after 48 hours. 7. Unless discharge instructions indicate otherwise, you may remove your bandages 24-48 hours after surgery, and you may shower at that time.  You may have steri-strips (small skin tapes) in place directly over the incision.  These strips should be left on the skin for 7-10 days.  If your surgeon used skin glue on the incision, you may shower in 24 hours.  The glue will flake off over the next 2-3 weeks.  Any sutures or staples will be removed at the office during your follow-up visit. 8. ACTIVITIES:  You may resume regular (light) daily activities beginning the next day--such as daily self-care, walking, climbing stairs--gradually increasing activities as tolerated.  You may have sexual intercourse when it is comfortable.  Refrain from any heavy lifting or straining until approved by your doctor. a. You may drive when you are no longer taking prescription pain medication, you can comfortably wear a seatbelt, and you can safely maneuver your car and apply brakes. b. RETURN TO WORK:  __________________________________________________________ 9. You should see your doctor in the office for a follow-up appointment approximately 2-3 weeks after your surgery.  Make sure that you call for this appointment within a day or two after you arrive home to insure a convenient appointment time. 10. OTHER INSTRUCTIONS: __________________________________________________________________________________________________________________________ __________________________________________________________________________________________________________________________ WHEN TO CALL YOUR DOCTOR: 1. Fever over 101.0 2. Inability to urinate 3. Continued  bleeding from incision. 4. Increased pain, redness, or drainage from the incision. 5. Increasing abdominal pain  The clinic staff is available to answer your questions during regular business hours.  Please dont hesitate to call and ask to speak to one of the nurses for clinical concerns.  If you have a medical emergency, go to the nearest emergency room or call 911.  A surgeon from [facility] is always on call at the hospital. [address] [place], [place], [place]  [address] ? [address], [place], [place]   [address] [phone] ? [phone] ? FAX [phone] Web site: [email or web address]

## 2016-05-17 NOTE — Transfer of Care (Signed)
Immediate Anesthesia Transfer of Care Note  Patient: Jose Harvey  Procedure(s) Performed: Procedure(s): LAPAROSCOPIC CHOLECYSTECTOMY (N/A)  Patient Location: PACU  Anesthesia Type:General  Level of Consciousness: awake, alert  and oriented  Airway & Oxygen Therapy: Patient Spontanous Breathing and Patient connected to face mask oxygen  Post-op Assessment: Report given to RN and Post -op Vital signs reviewed and stable  Post vital signs: Reviewed and stable  Last Vitals:  Vitals:   05/17/16 0619  BP: (!) 106/94  Pulse: 70  Resp: 18  Temp: 36.7 C    Last Pain:  Vitals:   05/17/16 0619  TempSrc: Oral         Complications: No apparent anesthesia complications

## 2016-05-17 NOTE — Op Note (Signed)
Operative Note  Jose Harvey 55 y.o. male [id number]  05/17/2016  Surgeon: Clovis Riley   Assistant: none  Procedure performed: Laparoscopic Cholecystectomy  Preop diagnosis: 45mm gallbladder polyp Post-op diagnosis/intraop findings: same  Specimens: gallbladder  EBL: [id number]  Complications: none  Description of procedure: After obtaining informed consent the patient was brought to the operating room. Prophylactic antibiotics and subcutaneous heparin were administered. SCD's were applied. General endotracheal anesthesia was initiated and a formal time-out was performed. The abdomen was prepped and draped in the usual sterile fashion and the abdomen was entered using an infraumbilical veress needle after instilling the site with local. Insufflation to 51mmHg was obtained, 61mm trocar introduced and gross inspection revealed no evidence of injury from our entry or other intraabdominal abnormalities. Two 53mm trocars were introduced in the right midclavicular and right anterior axillary lines under direct visualization and following infiltration with local. An epigastric 62mm trocar was placed. The gallbladder was retracted cephalad and the infundibulum was retracted laterally. A combination of hook electrocautery and blunt dissection was utilized to clear the peritoneum from the neck and cystic duct, circumferentially isolating the cystic artery and cystic duct and lifting the gallbladder from the cystic plate. The critical view of safety was achieved with the cystic artery, cystic duct, and liver bed visualized between them with no other structures. The artery was clipped with a single clip proximally and distally and divided as was the cystic duct with two clips on the proximal end. The gallbladder was dissected from the liver plate using electrocautery. Once freed the gallbladder was placed in an endocatch bag and removed through the epigastric trocar site. The field was hemostatic The clips  were well opposed without any bile leak from the duct or the liver bed. The clips were reinforced with a PDS endoloop on the cystic duct. The 60mm trocar site in the epigastrium was closed with a 0 vicryl in the fascia under direct visualization using a PMI device. The abdomen was desufflated and all trocars removed. The skin incisions were closed with running subcuticular monocryl and Dermabond. The patient was awakened, extubated and transported to the recovery room in stable condition.   All counts were correct at the completion of the case.

## 2016-05-17 NOTE — H&P (Signed)
[name]. Jose Harvey #: [id number] DOB: Oct 20, 1960 Single / Language: Jose Harvey / Race: White Male  History of Present Illness Harvey words: This is a very nice 55 year old man who presents with incidental finding of a gallbladder polyp on recent MRI. Jose Harvey is a former heavy smoker with a history of diabetes (on insulin pump), hypertension and hyperlipidemia. Jose Harvey does have mild dyspnea on exertion but states Jose Harvey could climb 2 flights of stairs without difficulty. Jose Harvey has had some COPD exacerbations in the last year that have required prednisone tapers. Jose Harvey was recommended lung cancer screening with low-dose CT of the chest which she underwent on 13 September, which showed multiple subcentimeter nodules, the largest of which was 8.3 mm, and follow-up PET scan showed low uptake. However, the CT scan also incidentally found multiple liver masses and a left adrenal mass-these had no uptake on PET scan in the liver and mild uptake in the adrenal mass. 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 A new incidental finding was observed on this MRI, a 1 cm enhancing pedunculated lesion associated with the wall of the gallbladder which could represent either a benign polyp or malignancy. There was no mention of any uptake in this region on the prior PET scan.  Jose Harvey is asymptomatic-Jose Harvey denies any abdominal pain, nausea, vomiting, or change in bowel function. Denies any jaundice or acholic stools, denies fevers chills or night sweats. Jose Harvey did have a little bit of weight loss recently but attributes this to significant stressors in his life, including the acquisition of his 53-year-old granddaughter whom Jose Harvey is now raising. Family history is notable for colon cancer in his maternal grandfather but no other cancers.    Other Problems Anxiety Disorder Chronic Obstructive Lung  Disease Diabetes Mellitus  Past Surgical History Oral Surgery Vasectomy  Diagnostic Studies History  Colonoscopy 1-5 years ago  Allergies  Crestor *ANTIHYPERLIPIDEMICS* Lipitor *ANTIHYPERLIPIDEMICS* Zocor *ANTIHYPERLIPIDEMICS*  Medication History  ALPRAZolam (0.25MG  Tablet, Oral) Active. Cialis (20MG  Tablet, Oral) Active. Dexilant (60MG  Capsule DR, Oral) Active. Lisinopril (5MG  Tablet, Oral) Active. NovoLOG (100UNIT/ML Solution, Subcutaneous) Active. Sertraline HCl (100MG  Tablet, Oral) Active. Symbicort (160-4.5MCG/ACT Aerosol, Inhalation) Active. Zetia (10MG  Tablet, Oral) Active. Sertraline HCl (50MG  Tablet, Oral) Active.   Social History  Alcohol use Moderate alcohol use. Caffeine use Carbonated beverages, Coffee. Tobacco use Former smoker.  Family History  Alcohol Abuse Mother. Arthritis Mother. Colon Cancer Family Members In General. Diabetes Mellitus Family Members In General, Father. Heart Disease Father. Heart disease in male family member before age 43 Respiratory Condition Father.     Review of Systems General Not Present- Appetite Loss, Chills, Fatigue, Fever, Night Sweats, Weight Gain and Weight Loss. Skin Not Present- Change in Wart/Mole, Dryness, Hives, Jaundice, New Lesions, Non-Healing Wounds, Rash and Ulcer. HEENT Present- Seasonal Allergies and Wears glasses/contact lenses. Not Present- Earache, Hearing Loss, Hoarseness, Nose Bleed, Oral Ulcers, Ringing in the Ears, Sinus Pain, Sore Throat, Visual Disturbances and Yellow Eyes. Respiratory Present- Snoring. Not Present- Bloody sputum, Chronic Cough, Difficulty Breathing and Wheezing. Breast Not Present- Breast Mass, Breast Pain, Nipple Discharge and Skin Changes. Cardiovascular Present- Shortness of Breath. Not Present- Chest Pain, Difficulty Breathing Lying Down, Leg Cramps, Palpitations, Rapid Heart Rate and Swelling of Extremities. Gastrointestinal Not  Present- Abdominal Pain, Bloating, Bloody Stool, Change in Bowel Habits, Chronic diarrhea, Constipation, Difficulty Swallowing, Excessive gas, Gets full quickly at meals, Hemorrhoids, Indigestion, Nausea, Rectal Pain and Vomiting.  Male Genitourinary Not Present- Blood in Urine, Change in Urinary Stream, Frequency, Impotence, Nocturia, Painful Urination, Urgency and Urine Leakage. Musculoskeletal Not Present- Back Pain, Joint Pain, Joint Stiffness, Muscle Pain, Muscle Weakness and Swelling of Extremities. Neurological Not Present- Decreased Memory, Fainting, Headaches, Numbness, Seizures, Tingling, Tremor, Trouble walking and Weakness. Psychiatric Present- Anxiety. Not Present- Bipolar, Change in Sleep Pattern, Depression, Fearful and Frequent crying. Endocrine Not Present- Cold Intolerance, Excessive Hunger, Hair Changes, Heat Intolerance, Hot flashes and New Diabetes. Hematology Not Present- Blood Thinners, Easy Bruising, Excessive bleeding, Gland problems, HIV and Persistent Infections.  Vitals:   05/17/16 0619  BP: (!) 106/94  Pulse: 70  Resp: 18  Temp: 98 F (36.7 C)      Physical Exam  General Mental Status-Alert. General Appearance-Consistent with stated age. Hydration-Well hydrated. Voice-Normal.  Head and Neck Head-normocephalic, atraumatic with no lesions or palpable masses. Trachea-midline. Thyroid Gland Characteristics - normal size and consistency.  Eye Eyeball - Bilateral-Extraocular movements intact. Sclera/Conjunctiva - Bilateral-No scleral icterus.  Chest and Lung Exam Chest and lung exam reveals -quiet, even and easy respiratory effort with no use of accessory muscles and on auscultation, normal breath sounds, no adventitious sounds and normal vocal resonance. Inspection Chest Wall - Normal. Back - normal.  Breast - Did not examine.  Cardiovascular Cardiovascular examination reveals -normal heart sounds, regular rate and rhythm  with no murmurs and normal pedal pulses bilaterally.  Abdomen Inspection Inspection of the abdomen reveals - No Hernias. Skin - Scar - no surgical scars. Palpation/Percussion Palpation and Percussion of the abdomen reveal - Soft, Non Tender, No Rebound tenderness, No Rigidity (guarding) and No hepatosplenomegaly. Auscultation Auscultation of the abdomen reveals - Bowel sounds normal.  Neurologic Neurologic evaluation reveals -alert and oriented x 3 with no impairment of recent or remote memory. Mental Status-Normal.  Musculoskeletal Normal Exam - Left-Upper Extremity Strength Normal and Lower Extremity Strength Normal. Normal Exam - Right-Upper Extremity Strength Normal and Lower Extremity Strength Normal.  Lymphatic - Did not examine.    Assessment & Plan  GALLBLADDER POLYP (Principal Diagnosis) (K82.4) Story: Measures 1 cm on recent MRI and is described as pedunculated. Reassuringly Jose Harvey had a PET scan that did not show any uptake. Given the size this does merit cholecystectomy. I would like to get an ultrasound gallbladder first just to further characterize the lesion and see if there are any other smaller lesions within the gallbladder. I discussed with him the small risk of gallbladder cancer as polyps increase in size, and described laparoscopic cholecystectomy and the risks and benefits including but not limited to bleeding, infection, pain, scarring and injury to the common bile duct and its ramifications. We'll go ahead and plan for laparoscopic cholecystectomy following ultrasound. Laparoscopic cholecystectomy pamphlet provided.

## 2016-05-17 NOTE — Anesthesia Postprocedure Evaluation (Signed)
Anesthesia Post Note  Patient: Jose Harvey  Procedure(s) Performed: Procedure(s) (LRB): LAPAROSCOPIC CHOLECYSTECTOMY (N/A)  Patient location during evaluation: PACU Anesthesia Type: General Level of consciousness: awake and alert Pain management: pain level controlled Vital Signs Assessment: post-procedure vital signs reviewed and stable Respiratory status: spontaneous breathing, nonlabored ventilation, respiratory function stable and patient connected to nasal cannula oxygen Cardiovascular status: blood pressure returned to baseline and stable Postop Assessment: no signs of nausea or vomiting Anesthetic complications: no    Last Vitals:  Vitals:   05/17/16 1030 05/17/16 1046  BP: 126/73 114/66  Pulse: 65 66  Resp: 14 14  Temp: 36.7 C 36.9 C    Last Pain:  Vitals:   05/17/16 1030  TempSrc:   PainSc: 3                  Sai Moura,JAMES TERRILL

## 2016-05-17 NOTE — Anesthesia Procedure Notes (Signed)
Procedure Name: Intubation Date/Time: 05/17/2016 8:34 AM Performed by: Danley Danker L Patient Re-evaluated:Patient Re-evaluated prior to inductionOxygen Delivery Method: Circle system utilized Preoxygenation: Pre-oxygenation with 100% oxygen Intubation Type: IV induction Ventilation: Mask ventilation without difficulty and Oral airway inserted - appropriate to patient size Laryngoscope Size: Miller and 3 Grade View: Grade II Tube type: Oral Tube size: 8.0 mm Number of attempts: 1 Airway Equipment and Method: Stylet Placement Confirmation: ETT inserted through vocal cords under direct vision,  positive ETCO2 and breath sounds checked- equal and bilateral Secured at: 22 cm Tube secured with: Tape Dental Injury: Teeth and Oropharynx as per pre-operative assessment

## 2016-06-23 ENCOUNTER — Telehealth: Payer: Self-pay | Admitting: Pharmacist

## 2016-06-23 ENCOUNTER — Other Ambulatory Visit: Payer: Self-pay | Admitting: Pharmacist

## 2016-06-23 MED ORDER — EZETIMIBE 10 MG PO TABS: 10.0000 mg | ORAL_TABLET | Freq: Every day | ORAL | 11 refills | Status: DC

## 2016-06-23 MED ORDER — PITAVASTATIN CALCIUM 2 MG PO TABS: 2.0000 mg | ORAL_TABLET | Freq: Every day | ORAL | 3 refills | Status: DC

## 2016-06-23 NOTE — Telephone Encounter (Signed)
I am ok with it and thank you!

## 2016-06-23 NOTE — Telephone Encounter (Signed)
Patient called clinic regarding change in copays for his cholesterol medications. Zetia is still affordable, but the copay for his Livalo has increased to $900 baseline price, and only improved to $600 with commercial copay card. Pt was previously seen by Ysidro Evert in lipid clinic on 01/29/14 and is intolerant to simvastatin, Crestor, and Lipitor (myalgias with each).  Discussed a few options with pt - either rechallenging with low dose statin or clinical trial. Pt does not have ASCVD or FH so unable to pursue PCSK9i. This excludes him from the ORION-10 study, but since pt has DM, he may qualify for PCSK9i with the Regeneron neurocognitive study. Clarified that pt uses his Xanax only prn (and for anxiety, not sleep, as his med list states). Pt will need an LDL of 100 or greater - advised pt to stop taking his Livalo (only had a few tablets left). Should be ok for trial screening in 1 month.  Patient is interested in learning more about the Regeneron study. Will forward information to our research nurses as well as Dr Meda Coffee to confirm that she is ok with [place] pursuing a clinical trial for Mr Matsuoka given lack of other clinical options at this time.

## 2016-08-01 NOTE — Telephone Encounter (Signed)
Jose Harvey was scheduled for screening for the Regeneron neurocog study.  The study ended prior to his screening.  I spoke with Jose Harvey and explained the study had closed to enrollment.  Subject verbalized understanding and would like consideration for future lipid trials.

## 2016-08-15 DIAGNOSIS — Z794 Long term (current) use of insulin: Secondary | ICD-10-CM | POA: Diagnosis not present

## 2016-08-15 DIAGNOSIS — E103293 Type 1 diabetes mellitus with mild nonproliferative diabetic retinopathy without macular edema, bilateral: Secondary | ICD-10-CM | POA: Diagnosis not present

## 2016-08-15 DIAGNOSIS — E1065 Type 1 diabetes mellitus with hyperglycemia: Secondary | ICD-10-CM | POA: Diagnosis not present

## 2016-08-21 MED ORDER — ROSUVASTATIN CALCIUM 5 MG PO TABS: 5.0000 mg | ORAL_TABLET | Freq: Every day | ORAL | 11 refills | Status: DC

## 2016-08-21 NOTE — Telephone Encounter (Signed)
Pt called since he still cannot afford his Livalo and the Regeneron trial closed within a few days of pt screening. He is willing to rechallenge with low dose Crestor 5mg  daily. Rx sent in and pt will call with tolerability concerns.

## 2016-08-21 NOTE — Addendum Note (Signed)
Addended by: SUPPLE, MEGAN E on: 08/21/2016 08:45 AM   Modules accepted: Orders

## 2016-08-24 DIAGNOSIS — Z9641 Presence of insulin pump (external) (internal): Secondary | ICD-10-CM | POA: Diagnosis not present

## 2016-08-24 DIAGNOSIS — E1065 Type 1 diabetes mellitus with hyperglycemia: Secondary | ICD-10-CM | POA: Diagnosis not present

## 2016-08-24 DIAGNOSIS — Z794 Long term (current) use of insulin: Secondary | ICD-10-CM | POA: Diagnosis not present

## 2016-09-11 ENCOUNTER — Ambulatory Visit: Payer: BLUE CROSS/BLUE SHIELD

## 2016-09-12 ENCOUNTER — Ambulatory Visit (INDEPENDENT_AMBULATORY_CARE_PROVIDER_SITE_OTHER)
Admission: RE | Admit: 2016-09-12 | Discharge: 2016-09-12 | Disposition: A | Discharge status: Home / Self Care | Payer: 59 | Source: Ambulatory Visit | Attending: Pulmonary Disease | Admitting: Pulmonary Disease

## 2016-09-12 ENCOUNTER — Telehealth: Payer: Self-pay | Admitting: Pharmacist

## 2016-09-12 DIAGNOSIS — Z122 Encounter for screening for malignant neoplasm of respiratory organs: Secondary | ICD-10-CM | POA: Diagnosis not present

## 2016-09-12 DIAGNOSIS — R911 Solitary pulmonary nodule: Secondary | ICD-10-CM | POA: Diagnosis not present

## 2016-09-12 DIAGNOSIS — Z87891 Personal history of nicotine dependence: Secondary | ICD-10-CM

## 2016-09-12 MED ORDER — EZETIMIBE 10 MG PO TABS: 10.0000 mg | ORAL_TABLET | Freq: Every day | ORAL | 3 refills | Status: DC

## 2016-09-12 MED ORDER — ROSUVASTATIN CALCIUM 5 MG PO TABS: 5.0000 mg | ORAL_TABLET | Freq: Every day | ORAL | 3 refills | Status: AC

## 2016-09-12 NOTE — Telephone Encounter (Signed)
90 supply of crestor and zetia send per pt request.

## 2016-09-18 ENCOUNTER — Other Ambulatory Visit: Payer: Self-pay

## 2016-09-18 DIAGNOSIS — R911 Solitary pulmonary nodule: Secondary | ICD-10-CM

## 2016-09-26 DIAGNOSIS — H43393 Other vitreous opacities, bilateral: Secondary | ICD-10-CM | POA: Insufficient documentation

## 2016-09-26 DIAGNOSIS — H2513 Age-related nuclear cataract, bilateral: Secondary | ICD-10-CM | POA: Diagnosis not present

## 2016-09-26 DIAGNOSIS — H35033 Hypertensive retinopathy, bilateral: Secondary | ICD-10-CM | POA: Diagnosis not present

## 2016-09-26 DIAGNOSIS — E103293 Type 1 diabetes mellitus with mild nonproliferative diabetic retinopathy without macular edema, bilateral: Secondary | ICD-10-CM | POA: Diagnosis not present

## 2016-10-16 DIAGNOSIS — D2362 Other benign neoplasm of skin of left upper limb, including shoulder: Secondary | ICD-10-CM | POA: Diagnosis not present

## 2016-10-16 DIAGNOSIS — L570 Actinic keratosis: Secondary | ICD-10-CM | POA: Diagnosis not present

## 2016-10-16 DIAGNOSIS — D485 Neoplasm of uncertain behavior of skin: Secondary | ICD-10-CM | POA: Diagnosis not present

## 2016-10-16 DIAGNOSIS — D225 Melanocytic nevi of trunk: Secondary | ICD-10-CM | POA: Diagnosis not present

## 2016-10-16 DIAGNOSIS — L821 Other seborrheic keratosis: Secondary | ICD-10-CM | POA: Diagnosis not present

## 2016-11-01 ENCOUNTER — Other Ambulatory Visit: Payer: Self-pay

## 2016-11-01 MED ORDER — BUDESONIDE-FORMOTEROL FUMARATE 160-4.5 MCG/ACT IN AERO: 2.0000 | INHALATION_SPRAY | Freq: Two times a day (BID) | RESPIRATORY_TRACT | 2 refills | Status: DC

## 2016-11-01 NOTE — Telephone Encounter (Signed)
Received refill request for symbicort 160mg . Last OV 02/29/16 Rx sent to preferred pharmacy. Nothing further needed.

## 2016-11-22 DIAGNOSIS — G4700 Insomnia, unspecified: Secondary | ICD-10-CM | POA: Diagnosis not present

## 2016-11-22 DIAGNOSIS — J387 Other diseases of larynx: Secondary | ICD-10-CM | POA: Diagnosis not present

## 2016-11-27 DIAGNOSIS — E1065 Type 1 diabetes mellitus with hyperglycemia: Secondary | ICD-10-CM | POA: Diagnosis not present

## 2016-11-27 DIAGNOSIS — Z9641 Presence of insulin pump (external) (internal): Secondary | ICD-10-CM | POA: Diagnosis not present

## 2016-11-27 DIAGNOSIS — Z794 Long term (current) use of insulin: Secondary | ICD-10-CM | POA: Diagnosis not present

## 2016-11-28 ENCOUNTER — Telehealth: Payer: Self-pay | Admitting: Acute Care

## 2016-11-28 NOTE — Telephone Encounter (Signed)
This patient needs a follow up nodule CT at the beginning of October 2018. Please place him on your tickle list to schedule. Thanks so much.

## 2016-11-29 NOTE — Telephone Encounter (Signed)
Pt added to reminder list for 03/2017.  Nothing further needed.

## 2017-02-09 ENCOUNTER — Telehealth: Payer: Self-pay | Admitting: Acute Care

## 2017-02-09 NOTE — Telephone Encounter (Signed)
Pt is calling stating that he is to have a ct scan done soon and would like to get this scheduled.  Will forward to DP to follow up on orders.  thanks

## 2017-02-13 DIAGNOSIS — J340 Abscess, furuncle and carbuncle of nose: Secondary | ICD-10-CM | POA: Insufficient documentation

## 2017-02-13 NOTE — Telephone Encounter (Signed)
Jose Harvey (PCC) to contact pt to schedule CT. 

## 2017-02-15 NOTE — Telephone Encounter (Signed)
Patient is following up to schedule appointment for CT. Patient stated no one has contacted him. [phone].

## 2017-02-15 NOTE — Telephone Encounter (Signed)
Jose Harvey please advise on scheduling the CT for the pt.  thanks

## 2017-02-19 NOTE — Telephone Encounter (Signed)
Jose Harvey is scheduled on 03/16/2017 @ 8:30am for his CT and he is aware of this appt and location

## 2017-02-20 DIAGNOSIS — E1165 Type 2 diabetes mellitus with hyperglycemia: Secondary | ICD-10-CM | POA: Diagnosis not present

## 2017-02-20 DIAGNOSIS — Z23 Encounter for immunization: Secondary | ICD-10-CM | POA: Diagnosis not present

## 2017-02-20 DIAGNOSIS — Z0000 Encounter for general adult medical examination without abnormal findings: Secondary | ICD-10-CM | POA: Diagnosis not present

## 2017-02-26 DIAGNOSIS — E1065 Type 1 diabetes mellitus with hyperglycemia: Secondary | ICD-10-CM | POA: Diagnosis not present

## 2017-02-26 DIAGNOSIS — Z794 Long term (current) use of insulin: Secondary | ICD-10-CM | POA: Diagnosis not present

## 2017-02-26 DIAGNOSIS — E103293 Type 1 diabetes mellitus with mild nonproliferative diabetic retinopathy without macular edema, bilateral: Secondary | ICD-10-CM | POA: Diagnosis not present

## 2017-03-05 DIAGNOSIS — D1803 Hemangioma of intra-abdominal structures: Secondary | ICD-10-CM

## 2017-03-05 DIAGNOSIS — D3502 Benign neoplasm of left adrenal gland: Secondary | ICD-10-CM

## 2017-03-05 DIAGNOSIS — I700 Atherosclerosis of aorta: Secondary | ICD-10-CM

## 2017-03-05 HISTORY — DX: Atherosclerosis of aorta: I70.0

## 2017-03-05 HISTORY — DX: Benign neoplasm of left adrenal gland: D35.02

## 2017-03-05 HISTORY — DX: Hemangioma of intra-abdominal structures: D18.03

## 2017-03-16 ENCOUNTER — Ambulatory Visit (INDEPENDENT_AMBULATORY_CARE_PROVIDER_SITE_OTHER)
Admission: RE | Admit: 2017-03-16 | Discharge: 2017-03-16 | Disposition: A | Discharge status: Home / Self Care | Payer: 59 | Source: Ambulatory Visit | Attending: Pulmonary Disease | Admitting: Pulmonary Disease

## 2017-03-16 DIAGNOSIS — R911 Solitary pulmonary nodule: Secondary | ICD-10-CM | POA: Diagnosis not present

## 2017-03-16 DIAGNOSIS — Z87891 Personal history of nicotine dependence: Secondary | ICD-10-CM | POA: Diagnosis not present

## 2017-03-22 ENCOUNTER — Other Ambulatory Visit: Payer: Self-pay | Admitting: *Deleted

## 2017-03-22 DIAGNOSIS — J438 Other emphysema: Secondary | ICD-10-CM

## 2017-03-23 ENCOUNTER — Ambulatory Visit: Payer: 59 | Admitting: *Deleted

## 2017-03-30 DIAGNOSIS — R972 Elevated prostate specific antigen [PSA]: Secondary | ICD-10-CM | POA: Diagnosis not present

## 2017-03-30 DIAGNOSIS — N402 Nodular prostate without lower urinary tract symptoms: Secondary | ICD-10-CM | POA: Diagnosis not present

## 2017-05-08 DIAGNOSIS — R972 Elevated prostate specific antigen [PSA]: Secondary | ICD-10-CM | POA: Diagnosis not present

## 2017-05-17 DIAGNOSIS — N402 Nodular prostate without lower urinary tract symptoms: Secondary | ICD-10-CM | POA: Diagnosis not present

## 2017-05-17 DIAGNOSIS — C61 Malignant neoplasm of prostate: Secondary | ICD-10-CM | POA: Diagnosis not present

## 2017-05-17 DIAGNOSIS — R8271 Bacteriuria: Secondary | ICD-10-CM | POA: Diagnosis not present

## 2017-05-18 ENCOUNTER — Other Ambulatory Visit: Payer: Self-pay | Admitting: Urology

## 2017-05-22 DIAGNOSIS — J449 Chronic obstructive pulmonary disease, unspecified: Secondary | ICD-10-CM | POA: Diagnosis not present

## 2017-05-22 DIAGNOSIS — Z01818 Encounter for other preprocedural examination: Secondary | ICD-10-CM | POA: Diagnosis not present

## 2017-05-22 DIAGNOSIS — E103293 Type 1 diabetes mellitus with mild nonproliferative diabetic retinopathy without macular edema, bilateral: Secondary | ICD-10-CM | POA: Diagnosis not present

## 2017-05-25 DIAGNOSIS — Z9641 Presence of insulin pump (external) (internal): Secondary | ICD-10-CM | POA: Diagnosis not present

## 2017-05-25 DIAGNOSIS — E1065 Type 1 diabetes mellitus with hyperglycemia: Secondary | ICD-10-CM | POA: Diagnosis not present

## 2017-05-25 DIAGNOSIS — Z794 Long term (current) use of insulin: Secondary | ICD-10-CM | POA: Diagnosis not present

## 2017-06-06 ENCOUNTER — Other Ambulatory Visit: Payer: Self-pay

## 2017-06-06 ENCOUNTER — Ambulatory Visit
Admission: RE | Admit: 2017-06-06 | Discharge: 2017-06-06 | Disposition: A | Discharge status: Home / Self Care | Payer: 59 | Source: Ambulatory Visit | Attending: Radiation Oncology | Admitting: Radiation Oncology

## 2017-06-06 ENCOUNTER — Encounter: Payer: Self-pay | Admitting: Radiation Oncology

## 2017-06-06 VITALS — BP 130/71 | HR 72 | Temp 98.0°F | Resp 16 | Wt 176.0 lb

## 2017-06-06 DIAGNOSIS — Z79899 Other long term (current) drug therapy: Secondary | ICD-10-CM | POA: Insufficient documentation

## 2017-06-06 DIAGNOSIS — Z7982 Long term (current) use of aspirin: Secondary | ICD-10-CM | POA: Insufficient documentation

## 2017-06-06 DIAGNOSIS — F329 Major depressive disorder, single episode, unspecified: Secondary | ICD-10-CM | POA: Insufficient documentation

## 2017-06-06 DIAGNOSIS — K219 Gastro-esophageal reflux disease without esophagitis: Secondary | ICD-10-CM | POA: Insufficient documentation

## 2017-06-06 DIAGNOSIS — C61 Malignant neoplasm of prostate: Secondary | ICD-10-CM | POA: Insufficient documentation

## 2017-06-06 DIAGNOSIS — Z87442 Personal history of urinary calculi: Secondary | ICD-10-CM | POA: Diagnosis not present

## 2017-06-06 DIAGNOSIS — H8110 Benign paroxysmal vertigo, unspecified ear: Secondary | ICD-10-CM | POA: Insufficient documentation

## 2017-06-06 DIAGNOSIS — E782 Mixed hyperlipidemia: Secondary | ICD-10-CM | POA: Diagnosis not present

## 2017-06-06 DIAGNOSIS — Z720 Tobacco use: Secondary | ICD-10-CM | POA: Insufficient documentation

## 2017-06-06 DIAGNOSIS — J449 Chronic obstructive pulmonary disease, unspecified: Secondary | ICD-10-CM | POA: Diagnosis not present

## 2017-06-06 DIAGNOSIS — R972 Elevated prostate specific antigen [PSA]: Secondary | ICD-10-CM | POA: Diagnosis not present

## 2017-06-06 HISTORY — DX: Malignant neoplasm of prostate: C61

## 2017-06-06 NOTE — Progress Notes (Signed)
GU Location of Tumor / Histology: prostatic adenocarcinoma  If Prostate Cancer, Gleason Score is (4 + 3) and PSA is (3.99). Prostate volume: 27.53 grams  Jose Harvey had a physical this year in August and was told his PSA was elevated at 4.04 and referred by his PCP to Dr. Gloriann Loan.  Biopsies of prostate (if applicable) revealed:    Past/Anticipated interventions by urology, if any: prostate biopsy, referral to radiation oncology, patient most interested in robotic prostatectomy  Past/Anticipated interventions by medical oncology, if any: no  Weight changes, if any: no  Bowel/Bladder complaints, if any: IPSS 0. Denies dysuria, leakage or incontinence. Reports scant hematuria since prostate biopsy.   Nausea/Vomiting, if any: no  Pain issues, if any:  no  SAFETY ISSUES:  Prior radiation? no  Pacemaker/ICD? no  Possible current pregnancy? no  Is the patient on methotrexate? no  Current Complaints / other details:  57 year old male. Divorced. Has two daughters; neither are local. Former smoker. Accompanied today by his sister. Resides in [place]. Maternal grandfather had colon cancer. Denies hx of breast or prostate cancer in family.

## 2017-06-06 NOTE — Progress Notes (Signed)
See progress note under physician encounter. 

## 2017-06-06 NOTE — Progress Notes (Signed)
Radiation Oncology         [phone] ________________________________  Initial Outpatient Consultation  Name: Jose Harvey MRN: [id number]  Date: 06/06/2017  DOB: Mar 07, 1961  WE:XHBZJIRCVEL, Jose Spies, MD  Leeroy Cha,*   REFERRING PHYSICIAN: Leeroy Cha,*  DIAGNOSIS: 57 y.o. 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 Malignant neoplasm of prostate ([place]) C61     HISTORY OF PRESENT ILLNESS::Jose Harvey Baugh is a 57 y.o. gentleman with a diagnosis of prostate cancer.  He was noted to have an elevated PSA of 4.07 at his annual physical on 03/02/2017 by his primary care physician, Dr. Fara Olden.  Accordingly, he was referred for evaluation in urology by Dr. Link Snuffer on 03/30/2017, where a digital rectal examination was performed at that time revealing a small 5 mm nodule at the right apex. Repeat PSA was also performed at that time and was 3.99. The patient proceeded to transrectal ultrasound with 12 biopsies of the prostate on 05/08/2017.  The prostate volume measured 27.53 cc.  Out of 12 core biopsies, 7 were positive.  The maximum Gleason score was 4+3, and this was seen in the right mid and right base lateral. 3+4 was seen in the right base and right apex. 3+3 was seen in the left base lateral, left mid, and left apex.  Biopsies of prostate revealed:     The patient reviewed the biopsy results with his urologist and currently has a robotic prostatectomy with bilateral lymph node dissection scheduled for 07/02/2017. In the meantime, he has kindly been referred today for discussion of potential radiation treatment options. He is accompanied by his sister.   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    PAST SURGICAL HISTORY: Past Surgical History:  Procedure Laterality Date  . CHOLECYSTECTOMY N/A 05/17/2016   Procedure: LAPAROSCOPIC CHOLECYSTECTOMY;  Surgeon: Clovis Riley, MD;  Location: [facility];  Service: General;  Laterality: N/A;  . cyst removed from throat     . dental surgeries     . PROSTATE BIOPSY    . TONSILLECTOMY      FAMILY HISTORY: family history includes CAD in his father; Colon cancer in his maternal grandfather; Diabetes type I in his father and paternal grandfather; Emphysema in his father and paternal grandfather; Heart attack in his father; Hypertension in his father.  SOCIAL HISTORY:  reports that he quit smoking about 9 years ago. His smoking use included cigarettes. He has a 40.50 pack-year smoking history. He quit smokeless tobacco use about 4 years ago. His smokeless tobacco use included snuff. He reports that he drinks about 3.6 oz of alcohol per week. He reports that he does not use drugs.  Divorced. Has two daughters, neither are local. Resides in [place].  ALLERGIES: Crestor [rosuvastatin]; Lipitor [atorvastatin]; Other; and Zocor [simvastatin]  MEDICATIONS:  Current Outpatient Medications  Medication Sig Dispense Refill  . aspirin 81 MG tablet Take 81 mg by mouth at bedtime.     Marland Kitchen  budesonide-formoterol (SYMBICORT) 160-4.5 MCG/ACT inhaler Inhale 2 puffs into the lungs 2 (two) times daily. 3 Inhaler 2  . co-enzyme Q-10 30 MG capsule Take 30 mg by mouth 3 (three) times daily.    . Insulin Human (INSULIN PUMP)  SOLN Inject into the skin continuous. Uses Novolog in pump Basal rate per patient is as follows: at 12 midnite- .6 , at 0500am-.8 then at 0800am-.6    . lisinopril (PRINIVIL,ZESTRIL) 5 MG tablet Take 1 tablet by mouth every evening.     . Multiple Vitamins-Minerals (ICAPS AREDS 2) CAPS Take 1 capsule by mouth daily.    . pantoprazole (PROTONIX) 40 MG tablet Take 40 mg by mouth daily.    . rosuvastatin (CRESTOR) 5 MG tablet Take 1 tablet (5 mg total) by mouth daily. 90 tablet 3  . sertraline (ZOLOFT) 100 MG tablet Take 100 mg by mouth every evening.   0  . CIALIS 20 MG tablet TAKE 1/2 OR 1 TABLET BY MOUTH AS NEEDED FOR ED  3  . glucagon (GLUCAGON EMERGENCY) 1 MG injection Inject 1 mg into the vein once as needed (low blood sugar).      No current facility-administered medications for this encounter.     REVIEW OF SYSTEMS:  On review of systems, the patient reports that he is doing well overall. He denies any chest pain, shortness of breath, cough, fevers, chills, night sweats, or unintended weight changes. He denies any bowel or bladder disturbances, and denies abdominal pain, nausea or vomiting. He denies any new musculoskeletal or joint aches or pains, new skin lesions or concerns. The patient completed an IPSS and IIEF questionnaire.  His IPSS score was 0 indicating no urinary outflow obstructive symptoms. He reports scant hematuria since prostate biopsy. He denies any dysuria, leakage or incontinence.  He indicated that he is not currently sexually active.  A complete review of systems is obtained and is otherwise negative.   PHYSICAL EXAM: This patient is in no acute distress.  He is alert and oriented.   weight is 176 lb (79.8 kg). His oral temperature is 98 F (36.7 C). His blood pressure is 130/71 and his pulse is 72. His respiration is 16 and oxygen saturation is 100%.  He exhibits no respiratory distress or labored breathing.  He appears neurologically intact.  His mood is pleasant.  His  affect is appropriate.  Please note the digital rectal exam findings described above.  KPS = 100  100 - Normal; no complaints; no evidence of disease. 90   - Able to carry on normal activity; minor signs or symptoms of disease. 80   - Normal activity with effort; some signs or symptoms of disease. 35   - Cares for self; unable to carry on normal activity or to do active work. 60   - Requires occasional assistance, but is able to care for most of his personal needs. 50   - Requires considerable assistance and frequent medical care. 70   - Disabled; requires special care and assistance. 55   - Severely disabled; hospital admission is indicated although death not imminent. 59   - Very sick; hospital admission necessary; active supportive treatment necessary. 10   - Moribund; fatal processes progressing rapidly. 0     - Dead  Karnofsky DA, Abelmann WH, Craver LS and [facility] [phone]) The use of the nitrogen mustards in the palliative treatment of carcinoma: with particular reference to bronchogenic carcinoma Cancer 1 634-56   LABORATORY DATA:  Lab Results  Component Value Date   WBC 7.7 05/11/2016   HGB 14.3 05/11/2016   HCT 42.7 05/11/2016   MCV 90.3 05/11/2016   PLT 277 05/11/2016   Lab Results  Component Value Date   NA 138 05/11/2016   K 4.0 05/11/2016   CL 105 05/11/2016   CO2 25 05/11/2016   Lab Results  Component Value Date   ALT 32 05/11/2016   AST 25 05/11/2016   ALKPHOS 71 05/11/2016   BILITOT 0.6 05/11/2016     RADIOGRAPHY: No results found.    IMPRESSION: This is a 57 y.o. 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  His T-Stage, Gleason's Score, and PSA put him into the intermediate risk group.  Accordingly he is eligible for a variety of potential treatment options including prostatectomy, brachytherapy, or 8 weeks of external radiation.   PLAN: We discussed radiation treatment in the management of  prostate cancer with regard to the logistics and delivery of external beam radiation treatment as well as the logistics and delivery of prostate brachytherapy.  We compared and contrasted each of these approaches and also compared these against prostatectomy.  The patient focused most of his questions and interest in robotic-assisted laparoscopic radical prostatectomy.  We discussed some of the potential advantages of surgery including surgical staging, the availability of salvage radiotherapy to the prostatic fossa, and the confidence associated with immediate biochemical response.  We discussed some of the potential proven indications for postoperative radiotherapy including positive margins, extracapsular extension, and seminal vesicle involvement. We also talked about some of the other potential findings leading to a recommendation for radiotherapy including a non-zero postoperative PSA and positive lymph nodes.   The patient would like to proceed with prostatectomy. I enjoyed meeting with him today, and will look forward to participating in the care of this very nice gentleman.  I will look forward to following his progress.   I spent time face to face with the patient and more than 50% of that time was spent in counseling and/or coordination of care.   ------------------------------------------------  Sheral Apley. Tammi Klippel, M.D.  This document serves as a record of services personally performed by Tyler Pita, MD. It was created on his behalf by Rae Lips, a trained medical scribe. The creation of this record is based on the scribe's personal observations and the provider's statements to them. This document has been checked and approved by the attending provider.

## 2017-06-12 ENCOUNTER — Encounter: Payer: Self-pay | Admitting: Medical Oncology

## 2017-06-12 NOTE — Progress Notes (Signed)
Called patient to introduce myself as the prostate nurse navigator and my role. I was unable to meet him the day he consulted with Dr. Tammi Klippel. He has chosen robotic prostatectomy as treatment which is scheduled 1?28/19. He states that the radiation consult was very informative and assured him he has made the correct treatment decision. I asked him to call me if I can be of assistance in any way and wished him well. He was very appreciative of my call.

## 2017-06-15 DIAGNOSIS — C61 Malignant neoplasm of prostate: Secondary | ICD-10-CM | POA: Diagnosis not present

## 2017-06-15 DIAGNOSIS — M62838 Other muscle spasm: Secondary | ICD-10-CM | POA: Diagnosis not present

## 2017-06-15 DIAGNOSIS — M6281 Muscle weakness (generalized): Secondary | ICD-10-CM | POA: Diagnosis not present

## 2017-06-19 DIAGNOSIS — Z794 Long term (current) use of insulin: Secondary | ICD-10-CM | POA: Diagnosis not present

## 2017-06-19 DIAGNOSIS — E1065 Type 1 diabetes mellitus with hyperglycemia: Secondary | ICD-10-CM | POA: Diagnosis not present

## 2017-06-19 DIAGNOSIS — E103293 Type 1 diabetes mellitus with mild nonproliferative diabetic retinopathy without macular edema, bilateral: Secondary | ICD-10-CM | POA: Diagnosis not present

## 2017-06-22 DIAGNOSIS — M62838 Other muscle spasm: Secondary | ICD-10-CM | POA: Diagnosis not present

## 2017-06-22 DIAGNOSIS — C61 Malignant neoplasm of prostate: Secondary | ICD-10-CM | POA: Diagnosis not present

## 2017-06-22 DIAGNOSIS — M6281 Muscle weakness (generalized): Secondary | ICD-10-CM | POA: Diagnosis not present

## 2017-06-27 ENCOUNTER — Encounter (HOSPITAL_COMMUNITY): Payer: Self-pay

## 2017-06-27 NOTE — Patient Instructions (Signed)
Your procedure is scheduled on: Monday, Jan. 28, 2019   Surgery Time: 7:30AM-10:30AM   Report to [facility]  Entrance   Follow map to Short Stay on first floor at 5:30 AM   Call this number if you have problems the morning of surgery [phone]   Do not eat food or drink liquids :After Midnight.   Do NOT smoke after Midnight   Take these medicines the morning of surgery with A SIP OF WATER: Pantoprazole, Rosuvatatin, Sertraline, Use inhalers per normal routine   DO NOT TAKE ANY DIABETIC MEDICATIONS DAY OF YOUR SURGERY                               You may not have any metal on your body including jewelry, and body piercings             Do not wear lotions, powders, perfumes/cologne, or deodorant                          Men may shave face and neck.   Do not bring valuables to the hospital. [place].   Contacts, dentures or bridgework may not be worn into surgery.   Leave suitcase in the car. After surgery it may be brought to your room.   Special Instructions: Bring a copy of your healthcare power of attorney and living will documents         the day of surgery if you haven't scanned them in before.              Please read over the following fact sheets you were given:  [facility] - Preparing for Surgery Before surgery, you can play an important role.  Because skin is not sterile, your skin needs to be as free of germs as possible.  You can reduce the number of germs on your skin by washing with CHG (chlorahexidine gluconate) soap before surgery.  CHG is an antiseptic cleaner which kills germs and bonds with the skin to continue killing germs even after washing. Please DO NOT use if you have an allergy to CHG or antibacterial soaps.  If your skin becomes reddened/irritated stop using the CHG and inform your nurse when you arrive at Short Stay. Do not shave (including legs and underarms) for at least 48  hours prior to the first CHG shower.  You may shave your face/neck.  Please follow these instructions carefully:  1.  Shower with CHG Soap the night before surgery and the  morning of surgery.  2.  If you choose to wash your hair, wash your hair first as usual with your normal  shampoo.  3.  After you shampoo, rinse your hair and body thoroughly to remove the shampoo.                             4.  Use CHG as you would any other liquid soap.  You can apply chg directly to the skin and wash.  Gently with a scrungie or clean washcloth.  5.  Apply the CHG Soap to your body ONLY FROM THE NECK DOWN.   Do   not use on face/ open  Wound or open sores. Avoid contact with eyes, ears mouth and   genitals (private parts).                       Wash face,  Genitals (private parts) with your normal soap.             6.  Wash thoroughly, paying special attention to the area where your    surgery  will be performed.  7.  Thoroughly rinse your body with warm water from the neck down.  8.  DO NOT shower/wash with your normal soap after using and rinsing off the CHG Soap.                9.  Pat yourself dry with a clean towel.            10.  Wear clean pajamas.            11.  Place clean sheets on your bed the night of your first shower and do not  sleep with pets. Day of Surgery : Do not apply any lotions/deodorants the morning of surgery.  Please wear clean clothes to the hospital/surgery center.  FAILURE TO FOLLOW THESE INSTRUCTIONS MAY RESULT IN THE CANCELLATION OF YOUR SURGERY  PATIENT SIGNATURE_________________________________  NURSE SIGNATURE__________________________________  ________________________________________________________________________ How to Manage Your Diabetes Before and After Surgery  Why is it important to control my blood sugar before and after surgery? . Improving blood sugar levels before and after surgery helps healing and can limit problems. . A  way of improving blood sugar control is eating a healthy diet by: o  Eating less sugar and carbohydrates o  Increasing activity/exercise o  Talking with your doctor about reaching your blood sugar goals . High blood sugars (greater than 180 mg/dL) can raise your risk of infections and slow your recovery, so you will need to focus on controlling your diabetes during the weeks before surgery. . Make sure that the doctor who takes care of your diabetes knows about your planned surgery including the date and location.  How do I manage my blood sugar before surgery? . Check your blood sugar at least 4 times a day, starting 2 days before surgery, to make sure that the level is not too high or low. o Check your blood sugar the morning of your surgery when you wake up and every 2 hours until you get to the Short Stay unit. . If your blood sugar is less than 70 mg/dL, you will need to treat for low blood sugar: o Do not take insulin. o Treat a low blood sugar (less than 70 mg/dL) with  cup of clear juice (cranberry or apple), 4 glucose tablets, OR glucose gel. o Recheck blood sugar in 15 minutes after treatment (to make sure it is greater than 70 mg/dL). If your blood sugar is not greater than 70 mg/dL on recheck, call [phone] for further instructions. . Report your blood sugar to the short stay nurse when you get to Short Stay.  . If you are admitted to the hospital after surgery: o Your blood sugar will be checked by the staff and you will probably be given insulin after surgery (instead of oral diabetes medicines) to make sure you have good blood sugar levels. o The goal for blood sugar control after surgery is 80-180 mg/dL.   WHAT DO I DO ABOUT MY DIABETES MEDICATION?  Marland Kitchen Do not take oral diabetes medicines (pills) the morning  of surgery.    For patients with insulin pumps: Contact your diabetes doctor for specific instructions before surgery. Decrease basal rates by 20% at midnight the  night before your surgery. Note that if your surgery is planned to be longer than 2 hours, your insulin pump will be removed and intravenous (IV) insulin will be started and managed by the nurses and the anesthesiologist. You will be able to restart your insulin pump once you are awake and able to manage it.  Make sure to bring insulin pump supplies to the hospital with you in case the  site needs to be changed.  Patient Signature:  Date:   Nurse Signature:  Date:   Reviewed and Endorsed by [facility] Patient Education Committee, August 2015

## 2017-06-27 NOTE — Pre-Procedure Instructions (Signed)
Surgical clearance, last office visit note, and EKG from Dr. Fara Olden in chart.

## 2017-06-28 ENCOUNTER — Encounter (HOSPITAL_COMMUNITY)
Admission: RE | Admit: 2017-06-28 | Discharge: 2017-06-28 | Disposition: A | Discharge status: Home / Self Care | Payer: 59 | Source: Ambulatory Visit | Attending: Urology | Admitting: Urology

## 2017-06-28 ENCOUNTER — Encounter (HOSPITAL_COMMUNITY): Payer: Self-pay

## 2017-06-28 ENCOUNTER — Other Ambulatory Visit: Payer: Self-pay

## 2017-06-28 DIAGNOSIS — E119 Type 2 diabetes mellitus without complications: Secondary | ICD-10-CM | POA: Insufficient documentation

## 2017-06-28 DIAGNOSIS — Z01818 Encounter for other preprocedural examination: Secondary | ICD-10-CM | POA: Diagnosis not present

## 2017-06-28 DIAGNOSIS — C61 Malignant neoplasm of prostate: Secondary | ICD-10-CM | POA: Diagnosis not present

## 2017-06-28 HISTORY — DX: Hemangioma of intra-abdominal structures: D18.03

## 2017-06-28 HISTORY — DX: Male erectile dysfunction, unspecified: N52.9

## 2017-06-28 HISTORY — DX: Functional dyspepsia: K30

## 2017-06-28 HISTORY — DX: Dyspnea, unspecified: R06.00

## 2017-06-28 HISTORY — DX: Cholesterolosis of gallbladder: K82.4

## 2017-06-28 HISTORY — DX: Emphysema, unspecified: J43.9

## 2017-06-28 HISTORY — DX: Atherosclerosis of aorta: I70.0

## 2017-06-28 HISTORY — DX: Atherosclerotic heart disease of native coronary artery without angina pectoris: I25.10

## 2017-06-28 HISTORY — DX: Benign neoplasm of left adrenal gland: D35.02

## 2017-06-28 LAB — BASIC METABOLIC PANEL
Anion gap: 6 (ref 5–15)
BUN: 23 mg/dL — AB (ref 6–20)
CALCIUM: 9.2 mg/dL (ref 8.9–10.3)
CO2: 27 mmol/L (ref 22–32)
CREATININE: 0.9 mg/dL (ref 0.61–1.24)
Chloride: 106 mmol/L (ref 101–111)
GFR calc Af Amer: >60 mL/min (ref >60)
GLUCOSE: 150 mg/dL — AB (ref 65–99)
Potassium: 4.5 mmol/L (ref 3.5–5.1)
SODIUM: 139 mmol/L (ref 135–145)

## 2017-06-28 LAB — CBC
HEMATOCRIT: 43.8 % (ref 39.0–52.0)
Hemoglobin: 14.6 g/dL (ref 13.0–17.0)
MCH: 30.3 pg (ref 26.0–34.0)
MCHC: 33.3 g/dL (ref 30.0–36.0)
MCV: 90.9 fL (ref 78.0–100.0)
PLATELETS: 252 10*3/uL (ref 150–400)
RBC: 4.82 MIL/uL (ref 4.22–5.81)
RDW: 13.3 % (ref 11.5–15.5)
WBC: 6.4 10*3/uL (ref 4.0–10.5)

## 2017-06-28 LAB — GLUCOSE, CAPILLARY: Glucose-Capillary: 155 mg/dL — ABNORMAL HIGH (ref 65–99)

## 2017-06-28 LAB — PROTIME-INR
INR: 0.98
PROTHROMBIN TIME: 12.9 s (ref 11.4–15.2)

## 2017-06-28 LAB — ABO/RH: ABO/RH(D): A NEG

## 2017-06-28 NOTE — Pre-Procedure Instructions (Signed)
Spoke with Larene Beach RN Diabetic coordinator, surgery greater than 2 hours insulin pump will be discontinued until Mr. Jose Harvey is able to manage his insulin pump, he will be placed on insulin drip until that time.  I called and spoke with mr. Tatum to make him aware and he verbalized understanding.

## 2017-06-28 NOTE — Pre-Procedure Instructions (Signed)
BMP results 06/28/17 faxed to Dr. Gloriann Loan via epic.

## 2017-06-28 NOTE — Pre-Procedure Instructions (Signed)
Hgb A1C 7.5 06/19/17 results are in chart.

## 2017-06-29 NOTE — Progress Notes (Signed)
Inpatient Diabetes Program Recommendations  AACE/ADA: New Consensus Statement on Inpatient Glycemic Control (2015)  Target Ranges:  Prepandial:   less than 140 mg/dL      Peak postprandial:   less than 180 mg/dL (1-2 hours)      Critically ill patients:  140 - 180 mg/dL   Lab Results  Component Value Date   GLUCAP 155 (H) 06/28/2017   HGBA1C 6.8 (H) 05/11/2016    Review of Glycemic Control  Diabetes history: DM1 Outpatient Diabetes medications: Insulin pump  Spoke with RN from [facility] regarding upcoming surgery on 1/28. Pt has insulin pump and with surgery being > 2 hours, IV insulin using GlucoStabilizer is recommended. (Goal 150-200 mg/dL, initial multiplier=0.01).  Type 1 DM patients are at much higher risk of DKA.  Pt can restart insulin pump when able to manage it per Insulin Pump Policy.   Thank you. Lorenda Peck, RD, LDN, CDE Inpatient Diabetes Coordinator [phone]

## 2017-07-01 NOTE — Anesthesia Preprocedure Evaluation (Addendum)
Anesthesia Evaluation  Patient identified by MRN, date of birth, ID band Patient awake    Reviewed: Allergy & Precautions, NPO status , Patient's Chart, lab work & pertinent test results  History of Anesthesia Complications Negative for: history of anesthetic complications  Airway Mallampati: II  TM Distance: >3 FB Neck ROM: Full   Comment: Small chin Dental  (+) Dental Advisory Given   Pulmonary COPD,  COPD inhaler, former smoker,    breath sounds clear to auscultation       Cardiovascular hypertension, Pt. on medications + Peripheral Vascular Disease   Rhythm:Regular Rate:Normal  '15 Stress ECHO: EF 60-80%, normal perfusion and valves, no ischemia   Neuro/Psych Anxiety Depression negative neurological ROS     GI/Hepatic Neg liver ROS, GERD  Medicated and Poorly Controlled,  Endo/Other  diabetes (glu 252), Insulin Dependent  Renal/GU    Prostate cancer    Musculoskeletal   Abdominal   Peds  Hematology negative hematology ROS (+)   Anesthesia Other Findings   Reproductive/Obstetrics                            Anesthesia Physical Anesthesia Plan  ASA: III  Anesthesia Plan: General   Post-op Pain Management:    Induction: Intravenous  PONV Risk Score and Plan: 3 and Ondansetron, Midazolam and Metaclopromide  Airway Management Planned: Oral ETT  Additional Equipment:   Intra-op Plan:   Post-operative Plan: Extubation in OR  Informed Consent: I have reviewed the patients History and Physical, chart, labs and discussed the procedure including the risks, benefits and alternatives for the proposed anesthesia with the patient or authorized representative who has indicated his/her understanding and acceptance.   Dental advisory given  Plan Discussed with: CRNA and Surgeon  Anesthesia Plan Comments: (Plan routine monitors, GETA  Insulin infusion)        Anesthesia Quick  Evaluation

## 2017-07-02 ENCOUNTER — Encounter (HOSPITAL_COMMUNITY)
Admission: RE | Disposition: A | Discharge status: Home / Self Care | Payer: Self-pay | Source: Ambulatory Visit | Attending: Urology

## 2017-07-02 ENCOUNTER — Encounter (HOSPITAL_COMMUNITY): Payer: Self-pay | Admitting: Emergency Medicine

## 2017-07-02 ENCOUNTER — Other Ambulatory Visit: Payer: Self-pay

## 2017-07-02 ENCOUNTER — Ambulatory Visit (HOSPITAL_COMMUNITY): Payer: 59 | Admitting: Anesthesiology

## 2017-07-02 ENCOUNTER — Observation Stay (HOSPITAL_COMMUNITY)
Admission: RE | Admit: 2017-07-02 | Discharge: 2017-07-03 | Disposition: A | Discharge status: Home / Self Care | Payer: 59 | Source: Ambulatory Visit | Attending: Urology | Admitting: Urology

## 2017-07-02 DIAGNOSIS — I700 Atherosclerosis of aorta: Secondary | ICD-10-CM | POA: Insufficient documentation

## 2017-07-02 DIAGNOSIS — I2510 Atherosclerotic heart disease of native coronary artery without angina pectoris: Secondary | ICD-10-CM | POA: Diagnosis not present

## 2017-07-02 DIAGNOSIS — Z794 Long term (current) use of insulin: Secondary | ICD-10-CM | POA: Insufficient documentation

## 2017-07-02 DIAGNOSIS — E103299 Type 1 diabetes mellitus with mild nonproliferative diabetic retinopathy without macular edema, unspecified eye: Secondary | ICD-10-CM | POA: Diagnosis not present

## 2017-07-02 DIAGNOSIS — E1051 Type 1 diabetes mellitus with diabetic peripheral angiopathy without gangrene: Secondary | ICD-10-CM | POA: Diagnosis not present

## 2017-07-02 DIAGNOSIS — K219 Gastro-esophageal reflux disease without esophagitis: Secondary | ICD-10-CM | POA: Diagnosis not present

## 2017-07-02 DIAGNOSIS — F419 Anxiety disorder, unspecified: Secondary | ICD-10-CM | POA: Insufficient documentation

## 2017-07-02 DIAGNOSIS — Z9641 Presence of insulin pump (external) (internal): Secondary | ICD-10-CM | POA: Diagnosis not present

## 2017-07-02 DIAGNOSIS — Z7951 Long term (current) use of inhaled steroids: Secondary | ICD-10-CM | POA: Diagnosis not present

## 2017-07-02 DIAGNOSIS — Z7982 Long term (current) use of aspirin: Secondary | ICD-10-CM | POA: Diagnosis not present

## 2017-07-02 DIAGNOSIS — I10 Essential (primary) hypertension: Secondary | ICD-10-CM | POA: Diagnosis not present

## 2017-07-02 DIAGNOSIS — E782 Mixed hyperlipidemia: Secondary | ICD-10-CM | POA: Insufficient documentation

## 2017-07-02 DIAGNOSIS — J439 Emphysema, unspecified: Secondary | ICD-10-CM | POA: Insufficient documentation

## 2017-07-02 DIAGNOSIS — E119 Type 2 diabetes mellitus without complications: Secondary | ICD-10-CM | POA: Diagnosis not present

## 2017-07-02 DIAGNOSIS — Z79899 Other long term (current) drug therapy: Secondary | ICD-10-CM | POA: Diagnosis not present

## 2017-07-02 DIAGNOSIS — C61 Malignant neoplasm of prostate: Secondary | ICD-10-CM | POA: Diagnosis not present

## 2017-07-02 DIAGNOSIS — Z87891 Personal history of nicotine dependence: Secondary | ICD-10-CM | POA: Diagnosis not present

## 2017-07-02 DIAGNOSIS — F329 Major depressive disorder, single episode, unspecified: Secondary | ICD-10-CM | POA: Diagnosis not present

## 2017-07-02 DIAGNOSIS — N529 Male erectile dysfunction, unspecified: Secondary | ICD-10-CM | POA: Insufficient documentation

## 2017-07-02 HISTORY — PX: ROBOT ASSISTED LAPAROSCOPIC RADICAL PROSTATECTOMY: SHX5141

## 2017-07-02 HISTORY — PX: LYMPHADENECTOMY: SHX5960

## 2017-07-02 LAB — TYPE AND SCREEN
ABO/RH(D): A NEG
Antibody Screen: NEGATIVE

## 2017-07-02 LAB — GLUCOSE, CAPILLARY
GLUCOSE-CAPILLARY: 258 mg/dL — AB (ref 65–99)
GLUCOSE-CAPILLARY: 127 mg/dL — AB (ref 65–99)
GLUCOSE-CAPILLARY: 116 mg/dL — AB (ref 65–99)
GLUCOSE-CAPILLARY: 115 mg/dL — AB (ref 65–99)
GLUCOSE-CAPILLARY: 212 mg/dL — AB (ref 65–99)
Glucose-Capillary: 175 mg/dL — ABNORMAL HIGH (ref 65–99)
Glucose-Capillary: 204 mg/dL — ABNORMAL HIGH (ref 65–99)
Glucose-Capillary: 252 mg/dL — ABNORMAL HIGH (ref 65–99)
Glucose-Capillary: 218 mg/dL — ABNORMAL HIGH (ref 65–99)
Glucose-Capillary: 199 mg/dL — ABNORMAL HIGH (ref 65–99)
Glucose-Capillary: 168 mg/dL — ABNORMAL HIGH (ref 65–99)
Glucose-Capillary: 135 mg/dL — ABNORMAL HIGH (ref 65–99)
Glucose-Capillary: 127 mg/dL — ABNORMAL HIGH (ref 65–99)

## 2017-07-02 LAB — HEMOGLOBIN AND HEMATOCRIT, BLOOD
HEMATOCRIT: 36.4 % — AB (ref 39.0–52.0)
HEMOGLOBIN: 12.4 g/dL — AB (ref 13.0–17.0)

## 2017-07-02 LAB — BASIC METABOLIC PANEL
ANION GAP: 7 (ref 5–15)
BUN: 23 mg/dL — ABNORMAL HIGH (ref 6–20)
CHLORIDE: 106 mmol/L (ref 101–111)
CO2: 25 mmol/L (ref 22–32)
CREATININE: 0.91 mg/dL (ref 0.61–1.24)
Calcium: 8.4 mg/dL — ABNORMAL LOW (ref 8.9–10.3)
GFR calc Af Amer: >60 mL/min (ref >60)
GFR calc non Af Amer: >60 mL/min (ref >60)
Glucose, Bld: 135 mg/dL — ABNORMAL HIGH (ref 65–99)
Potassium: 4 mmol/L (ref 3.5–5.1)
SODIUM: 138 mmol/L (ref 135–145)

## 2017-07-02 SURGERY — PROSTATECTOMY, RADICAL, ROBOT-ASSISTED, LAPAROSCOPIC
Anesthesia: General

## 2017-07-02 MED ORDER — METOCLOPRAMIDE HCL 5 MG/ML IJ SOLN
INTRAMUSCULAR | Status: DC | PRN
  Administered 2017-07-02: 10 mg via INTRAVENOUS

## 2017-07-02 MED ORDER — SODIUM CHLORIDE 0.9 % IJ SOLN
INTRAMUSCULAR | Status: DC | PRN
  Administered 2017-07-02: 20 mL

## 2017-07-02 MED ORDER — ONDANSETRON HCL 4 MG/2ML IJ SOLN
INTRAMUSCULAR | Status: AC
  Filled 2017-07-02: qty 2

## 2017-07-02 MED ORDER — SERTRALINE HCL 100 MG PO TABS
100.0000 mg | ORAL_TABLET | Freq: Every day | ORAL | Status: DC
  Administered 2017-07-03: 100 mg via ORAL
  Filled 2017-07-02: qty 1

## 2017-07-02 MED ORDER — FENTANYL CITRATE (PF) 250 MCG/5ML IJ SOLN
INTRAMUSCULAR | Status: AC
  Filled 2017-07-02: qty 5

## 2017-07-02 MED ORDER — MIDAZOLAM HCL 2 MG/2ML IJ SOLN
INTRAMUSCULAR | Status: AC
  Filled 2017-07-02: qty 2

## 2017-07-02 MED ORDER — METOCLOPRAMIDE HCL 5 MG/ML IJ SOLN
INTRAMUSCULAR | Status: AC
  Filled 2017-07-02: qty 2

## 2017-07-02 MED ORDER — MIDAZOLAM HCL 2 MG/2ML IJ SOLN: 0.5000 mg | Freq: Once | INTRAMUSCULAR | Status: DC | PRN

## 2017-07-02 MED ORDER — LIDOCAINE 2% (20 MG/ML) 5 ML SYRINGE
INTRAMUSCULAR | Status: AC
  Filled 2017-07-02: qty 5

## 2017-07-02 MED ORDER — SUGAMMADEX SODIUM 200 MG/2ML IV SOLN
INTRAVENOUS | Status: AC
  Filled 2017-07-02: qty 2

## 2017-07-02 MED ORDER — ACETAMINOPHEN 500 MG PO TABS
1000.0000 mg | ORAL_TABLET | Freq: Four times a day (QID) | ORAL | Status: DC
  Administered 2017-07-02 – 2017-07-03 (×3): 1000 mg via ORAL
  Filled 2017-07-02 (×4): qty 2

## 2017-07-02 MED ORDER — INSULIN PUMP
Freq: Three times a day (TID) | SUBCUTANEOUS | Status: DC
  Administered 2017-07-02: 23:00:00 via SUBCUTANEOUS
  Administered 2017-07-03: 1 via SUBCUTANEOUS
  Administered 2017-07-03: 12:00:00 via SUBCUTANEOUS
  Filled 2017-07-02: qty 1

## 2017-07-02 MED ORDER — SODIUM CHLORIDE 0.9 % IJ SOLN
INTRAMUSCULAR | Status: AC
  Filled 2017-07-02: qty 20

## 2017-07-02 MED ORDER — LACTATED RINGERS IR SOLN
Status: DC | PRN
  Administered 2017-07-02: 1000 mL

## 2017-07-02 MED ORDER — BUPIVACAINE LIPOSOME 1.3 % IJ SUSP
20.0000 mL | Freq: Once | INTRAMUSCULAR | Status: AC
  Administered 2017-07-02: 20 mL
  Filled 2017-07-02: qty 20

## 2017-07-02 MED ORDER — CALCIUM CARBONATE ANTACID 500 MG PO CHEW
2.0000 | CHEWABLE_TABLET | Freq: Four times a day (QID) | ORAL | Status: DC | PRN
  Administered 2017-07-02: 400 mg via ORAL
  Filled 2017-07-02: qty 2

## 2017-07-02 MED ORDER — LACTATED RINGERS IV SOLN
INTRAVENOUS | Status: DC | PRN
  Administered 2017-07-02 (×3): via INTRAVENOUS

## 2017-07-02 MED ORDER — ROCURONIUM BROMIDE 50 MG/5ML IV SOSY
PREFILLED_SYRINGE | INTRAVENOUS | Status: AC
  Filled 2017-07-02: qty 10

## 2017-07-02 MED ORDER — PROPOFOL 10 MG/ML IV BOLUS
INTRAVENOUS | Status: DC | PRN
  Administered 2017-07-02: 180 mg via INTRAVENOUS

## 2017-07-02 MED ORDER — ROCURONIUM BROMIDE 50 MG/5ML IV SOSY
PREFILLED_SYRINGE | INTRAVENOUS | Status: DC | PRN
  Administered 2017-07-02 (×3): 20 mg via INTRAVENOUS
  Administered 2017-07-02: 30 mg via INTRAVENOUS
  Administered 2017-07-02: 50 mg via INTRAVENOUS
  Administered 2017-07-02: 30 mg via INTRAVENOUS
  Administered 2017-07-02: 50 mg via INTRAVENOUS

## 2017-07-02 MED ORDER — BELLADONNA ALKALOIDS-OPIUM 16.2-60 MG RE SUPP: 1.0000 | Freq: Four times a day (QID) | RECTAL | Status: DC | PRN

## 2017-07-02 MED ORDER — PHENYLEPHRINE 40 MCG/ML (10ML) SYRINGE FOR IV PUSH (FOR BLOOD PRESSURE SUPPORT)
PREFILLED_SYRINGE | INTRAVENOUS | Status: DC | PRN
  Administered 2017-07-02 (×2): 40 ug via INTRAVENOUS
  Administered 2017-07-02: 80 ug via INTRAVENOUS

## 2017-07-02 MED ORDER — INSULIN ASPART 100 UNIT/ML ~~LOC~~ SOLN
SUBCUTANEOUS | Status: AC
  Filled 2017-07-02: qty 1

## 2017-07-02 MED ORDER — SODIUM CHLORIDE 0.9 % IV SOLN
INTRAVENOUS | Status: DC
  Administered 2017-07-02: 08:00:00 via INTRAVENOUS

## 2017-07-02 MED ORDER — LISINOPRIL 5 MG PO TABS
5.0000 mg | ORAL_TABLET | Freq: Every day | ORAL | Status: DC
  Administered 2017-07-03: 5 mg via ORAL
  Filled 2017-07-02: qty 1

## 2017-07-02 MED ORDER — CEFAZOLIN SODIUM-DEXTROSE 1-4 GM/50ML-% IV SOLN
1.0000 g | Freq: Three times a day (TID) | INTRAVENOUS | Status: AC
  Administered 2017-07-02 – 2017-07-03 (×2): 1 g via INTRAVENOUS
  Filled 2017-07-02 (×2): qty 50

## 2017-07-02 MED ORDER — MOMETASONE FURO-FORMOTEROL FUM 200-5 MCG/ACT IN AERO
2.0000 | INHALATION_SPRAY | Freq: Two times a day (BID) | RESPIRATORY_TRACT | Status: DC
  Filled 2017-07-02: qty 8.8

## 2017-07-02 MED ORDER — FENTANYL CITRATE (PF) 100 MCG/2ML IJ SOLN
INTRAMUSCULAR | Status: DC | PRN
  Administered 2017-07-02 (×7): 50 ug via INTRAVENOUS

## 2017-07-02 MED ORDER — MEPERIDINE HCL 50 MG/ML IJ SOLN: 6.2500 mg | INTRAMUSCULAR | Status: DC | PRN

## 2017-07-02 MED ORDER — PROMETHAZINE HCL 25 MG/ML IJ SOLN: 6.2500 mg | INTRAMUSCULAR | Status: DC | PRN

## 2017-07-02 MED ORDER — LIDOCAINE 2% (20 MG/ML) 5 ML SYRINGE
INTRAMUSCULAR | Status: DC | PRN
  Administered 2017-07-02 (×2): 50 mg via INTRAVENOUS

## 2017-07-02 MED ORDER — CEFAZOLIN SODIUM-DEXTROSE 2-4 GM/100ML-% IV SOLN
2.0000 g | INTRAVENOUS | Status: AC
  Administered 2017-07-02: 2 g via INTRAVENOUS
  Filled 2017-07-02: qty 100

## 2017-07-02 MED ORDER — ROCURONIUM BROMIDE 50 MG/5ML IV SOSY
PREFILLED_SYRINGE | INTRAVENOUS | Status: AC
  Filled 2017-07-02: qty 15

## 2017-07-02 MED ORDER — EPHEDRINE 5 MG/ML INJ
INTRAVENOUS | Status: AC
Start: 2017-07-02 — End: ?
  Filled 2017-07-02: qty 10

## 2017-07-02 MED ORDER — SODIUM CHLORIDE 0.9 % IV SOLN
INTRAVENOUS | Status: DC
  Administered 2017-07-02 (×2): via INTRAVENOUS

## 2017-07-02 MED ORDER — STERILE WATER FOR IRRIGATION IR SOLN
Status: DC | PRN
  Administered 2017-07-02: 1000 mL

## 2017-07-02 MED ORDER — LIDOCAINE 5 % EX PTCH
1.0000 | MEDICATED_PATCH | CUTANEOUS | Status: DC
  Administered 2017-07-02: 1 via TRANSDERMAL
  Filled 2017-07-02 (×2): qty 1

## 2017-07-02 MED ORDER — SODIUM CHLORIDE 0.9 % IV SOLN
INTRAVENOUS | Status: DC
  Administered 2017-07-02: 1.4 [IU]/h via INTRAVENOUS
  Filled 2017-07-02 (×2): qty 1

## 2017-07-02 MED ORDER — DEXTROSE 50 % IV SOLN: 25.0000 mL | INTRAVENOUS | Status: DC | PRN

## 2017-07-02 MED ORDER — SODIUM CHLORIDE 0.9 % IV BOLUS (SEPSIS)
1000.0000 mL | Freq: Once | INTRAVENOUS | Status: AC
  Administered 2017-07-02: 1000 mL via INTRAVENOUS

## 2017-07-02 MED ORDER — PANTOPRAZOLE SODIUM 40 MG PO TBEC
40.0000 mg | DELAYED_RELEASE_TABLET | Freq: Every day | ORAL | Status: DC
  Administered 2017-07-03: 40 mg via ORAL
  Filled 2017-07-02: qty 1

## 2017-07-02 MED ORDER — PROPOFOL 10 MG/ML IV BOLUS
INTRAVENOUS | Status: AC
  Filled 2017-07-02: qty 20

## 2017-07-02 MED ORDER — OXYCODONE HCL 5 MG PO TABS: 5.0000 mg | ORAL_TABLET | ORAL | Status: DC | PRN

## 2017-07-02 MED ORDER — HYDROMORPHONE HCL 1 MG/ML IJ SOLN
0.2500 mg | INTRAMUSCULAR | Status: DC | PRN
Start: 2017-07-02 — End: 2017-07-02

## 2017-07-02 MED ORDER — SENNOSIDES-DOCUSATE SODIUM 8.6-50 MG PO TABS
2.0000 | ORAL_TABLET | Freq: Every day | ORAL | Status: DC
  Administered 2017-07-02: 2 via ORAL
  Filled 2017-07-02: qty 2

## 2017-07-02 MED ORDER — ROSUVASTATIN CALCIUM 5 MG PO TABS
5.0000 mg | ORAL_TABLET | Freq: Every day | ORAL | Status: DC
  Administered 2017-07-03: 5 mg via ORAL
  Filled 2017-07-02: qty 1

## 2017-07-02 MED ORDER — DOCUSATE SODIUM 100 MG PO CAPS
100.0000 mg | ORAL_CAPSULE | Freq: Two times a day (BID) | ORAL | Status: DC
  Administered 2017-07-02 – 2017-07-03 (×2): 100 mg via ORAL
  Filled 2017-07-02 (×2): qty 1

## 2017-07-02 MED ORDER — MIDAZOLAM HCL 5 MG/5ML IJ SOLN
INTRAMUSCULAR | Status: DC | PRN
  Administered 2017-07-02: 2 mg via INTRAVENOUS

## 2017-07-02 MED ORDER — SUGAMMADEX SODIUM 200 MG/2ML IV SOLN
INTRAVENOUS | Status: DC | PRN
  Administered 2017-07-02: 200 mg via INTRAVENOUS

## 2017-07-02 MED ORDER — EPHEDRINE SULFATE-NACL 50-0.9 MG/10ML-% IV SOSY
PREFILLED_SYRINGE | INTRAVENOUS | Status: DC | PRN
  Administered 2017-07-02: 15 mg via INTRAVENOUS
  Administered 2017-07-02: 5 mg via INTRAVENOUS

## 2017-07-02 MED ORDER — ONDANSETRON HCL 4 MG/2ML IJ SOLN
INTRAMUSCULAR | Status: DC | PRN
  Administered 2017-07-02: 4 mg via INTRAVENOUS

## 2017-07-02 MED ORDER — DEXTROSE-NACL 5-0.45 % IV SOLN
INTRAVENOUS | Status: DC
  Administered 2017-07-02: 13:00:00 via INTRAVENOUS

## 2017-07-02 MED ORDER — ONDANSETRON HCL 4 MG/2ML IJ SOLN: 4.0000 mg | INTRAMUSCULAR | Status: DC | PRN

## 2017-07-02 MED ORDER — MORPHINE SULFATE (PF) 4 MG/ML IV SOLN: 2.0000 mg | INTRAVENOUS | Status: DC | PRN

## 2017-07-02 MED ORDER — PHENYLEPHRINE 40 MCG/ML (10ML) SYRINGE FOR IV PUSH (FOR BLOOD PRESSURE SUPPORT)
PREFILLED_SYRINGE | INTRAVENOUS | Status: AC
Start: 2017-07-02 — End: ?
  Filled 2017-07-02: qty 10

## 2017-07-02 MED ORDER — INSULIN REGULAR BOLUS VIA INFUSION
0.0000 [IU] | Freq: Three times a day (TID) | INTRAVENOUS | Status: DC
  Filled 2017-07-02: qty 10

## 2017-07-02 SURGICAL SUPPLY — 63 items
ADH SKN CLS APL DERMABOND .7 (GAUZE/BANDAGES/DRESSINGS) ×2
APL ESCP 34 STRL LF DISP (HEMOSTASIS) ×2
APPLICATOR COTTON TIP 6IN STRL (MISCELLANEOUS) ×3 IMPLANT
APPLICATOR SURGIFLO ENDO (HEMOSTASIS) ×3 IMPLANT
CATH FOLEY 2WAY SLVR  5CC 18FR (CATHETERS) ×1
CATH FOLEY 2WAY SLVR 18FR 30CC (CATHETERS) ×3 IMPLANT
CATH FOLEY 2WAY SLVR 5CC 18FR (CATHETERS) ×2 IMPLANT
CATH ROBINSON RED A/P 8FR (CATHETERS) ×2 IMPLANT
CHLORAPREP W/TINT 26ML (MISCELLANEOUS) ×3 IMPLANT
CLIP VESOLOCK LG 6/CT PURPLE (CLIP) ×6 IMPLANT
COVER SURGICAL LIGHT HANDLE (MISCELLANEOUS) ×3 IMPLANT
COVER TIP SHEARS 8 DVNC (MISCELLANEOUS) ×2 IMPLANT
COVER TIP SHEARS 8MM DA VINCI (MISCELLANEOUS) ×1
CUTTER ECHEON FLEX ENDO 45 340 (ENDOMECHANICALS) ×3 IMPLANT
DECANTER SPIKE VIAL GLASS SM (MISCELLANEOUS) ×3 IMPLANT
DERMABOND ADVANCED (GAUZE/BANDAGES/DRESSINGS) ×1
DERMABOND ADVANCED .7 DNX12 (GAUZE/BANDAGES/DRESSINGS) IMPLANT
DRAPE ARM DVNC X/XI (DISPOSABLE) ×8 IMPLANT
DRAPE COLUMN DVNC XI (DISPOSABLE) ×2 IMPLANT
DRAPE DA VINCI XI ARM (DISPOSABLE) ×4
DRAPE DA VINCI XI COLUMN (DISPOSABLE) ×1
DRAPE SURG IRRIG POUCH 19X23 (DRAPES) ×3 IMPLANT
DRSG TEGADERM 4X4.75 (GAUZE/BANDAGES/DRESSINGS) ×3 IMPLANT
ELECT REM PT RETURN 15FT ADLT (MISCELLANEOUS) ×3 IMPLANT
FLOSEAL 10ML (HEMOSTASIS) ×3 IMPLANT
GLOVE BIO SURGEON STRL SZ 6.5 (GLOVE) IMPLANT
GLOVE BIOGEL M STRL SZ7.5 (GLOVE) ×6 IMPLANT
GOWN STRL REUS W/TWL LRG LVL3 (GOWN DISPOSABLE) ×9 IMPLANT
HEMOSTAT SURGICEL 4X8 (HEMOSTASIS) IMPLANT
HOLDER FOLEY CATH W/STRAP (MISCELLANEOUS) ×3 IMPLANT
IRRIG SUCT STRYKERFLOW 2 WTIP (MISCELLANEOUS) ×3
IRRIGATION SUCT STRKRFLW 2 WTP (MISCELLANEOUS) ×2 IMPLANT
IV LACTATED RINGERS 1000ML (IV SOLUTION) ×3 IMPLANT
NDL INSUFFLATION 14GA 120MM (NEEDLE) ×2 IMPLANT
NDL SAFETY ECLIPSE 18X1.5 (NEEDLE) ×2 IMPLANT
NEEDLE HYPO 18GX1.5 SHARP (NEEDLE)
NEEDLE INSUFFLATION 14GA 120MM (NEEDLE) ×3 IMPLANT
PACK ROBOT UROLOGY CUSTOM (CUSTOM PROCEDURE TRAY) ×3 IMPLANT
PAD POSITIONING PINK XL (MISCELLANEOUS) ×3 IMPLANT
PORT ACCESS TROCAR AIRSEAL 12 (TROCAR) ×2 IMPLANT
PORT ACCESS TROCAR AIRSEAL 5M (TROCAR) ×1
RELOAD STAPLE 45 4.1 GRN THCK (STAPLE) ×2 IMPLANT
SEAL CANN UNIV 5-8 DVNC XI (MISCELLANEOUS) ×8 IMPLANT
SEAL XI 5MM-8MM UNIVERSAL (MISCELLANEOUS) ×4
SET TRI-LUMEN FLTR TB AIRSEAL (TUBING) ×3 IMPLANT
SOLUTION ELECTROLUBE (MISCELLANEOUS) ×3 IMPLANT
SPONGE LAP 4X18 X RAY DECT (DISPOSABLE) ×3 IMPLANT
STAPLE RELOAD 45 GRN (STAPLE) ×2 IMPLANT
STAPLE RELOAD 45MM GREEN (STAPLE) ×3
SUT ETHILON 3 0 PS 1 (SUTURE) ×3 IMPLANT
SUT MNCRL AB 4-0 PS2 18 (SUTURE) ×6 IMPLANT
SUT VIC AB 0 UR5 27 (SUTURE) ×3 IMPLANT
SUT VIC AB 2-0 SH 27 (SUTURE) ×3
SUT VIC AB 2-0 SH 27X BRD (SUTURE) ×2 IMPLANT
SUT VICRYL 0 UR6 27IN ABS (SUTURE) ×6 IMPLANT
SUT VLOC BARB 180 ABS3/0GR12 (SUTURE) ×6
SUTURE VLOC BRB 180 ABS3/0GR12 (SUTURE) ×4 IMPLANT
SYR 27GX1/2 1ML LL SAFETY (SYRINGE) ×3 IMPLANT
SYRINGE IRR TOOMEY STRL 70CC (SYRINGE) ×3 IMPLANT
TOWEL OR 17X26 10 PK STRL BLUE (TOWEL DISPOSABLE) ×3 IMPLANT
TOWEL OR NON WOVEN STRL DISP B (DISPOSABLE) ×3 IMPLANT
TUBING INSUFFLATION 10FT LAP (TUBING) IMPLANT
WATER STERILE IRR 1000ML POUR (IV SOLUTION) ×3 IMPLANT

## 2017-07-02 NOTE — Anesthesia Procedure Notes (Addendum)
Procedure Name: Intubation Date/Time: 07/02/2017 8:02 AM Performed by: Deliah Boston, CRNA Pre-anesthesia Checklist: Patient identified, Emergency Drugs available, Suction available and Patient being monitored Patient Re-evaluated:Patient Re-evaluated prior to induction Oxygen Delivery Method: Circle system utilized Preoxygenation: Pre-oxygenation with 100% oxygen Induction Type: IV induction Ventilation: Mask ventilation without difficulty Laryngoscope Size: Mac and 4 Grade View: Grade II Tube type: Oral Tube size: 7.5 mm Number of attempts: 2 Airway Equipment and Method: Stylet and Oral airway Placement Confirmation: ETT inserted through vocal cords under direct vision,  positive ETCO2 and breath sounds checked- equal and bilateral Secured at: 23 cm Tube secured with: Tape Dental Injury: Teeth and Oropharynx as per pre-operative assessment  Difficulty Due To: Difficulty was anticipated, Difficult Airway- due to limited oral opening and Difficult Airway- due to anterior larynx Comments: DVL x1 by CRNA with Mac 4,  grade 3 view. DVL x1 by MDA with Mac 4,  grade 2 view, atraumatic oral intubation, + & = bilateral breath sounds

## 2017-07-02 NOTE — Interval H&P Note (Signed)
History and Physical Interval Note:  07/02/2017 7:14 AM  Jose Harvey  has presented today for surgery, with the diagnosis of PROSTATE CANCER  The various methods of treatment have been discussed with the patient and family. After consideration of risks, benefits and other options for treatment, the patient has consented to  Procedure(s): XI ROBOTIC [place] (N/A) BILATERAL LYMPHADENECTOMY (Bilateral) as a surgical intervention .  [name] history has been reviewed, patient examined, no change in status, stable for surgery.  I have reviewed the patient's chart and labs.  Questions were answered to the patient's satisfaction.     Marton Redwood, III

## 2017-07-02 NOTE — Progress Notes (Signed)
Pt cbg 258. Dr. Glennon Mac notified of this and told pt to cover himself with insulin pump, pt followed this direction and disconnected pump from lower left abd after bolus.  Per Dr. Glennon Mac, leave access to go to OR and it will be removed if needed to prep by OR nurses. Sister is going to pts house to pick up new supplies for pt to initiate pump after surgery. Sister has insulin pump and belongings in her possession.

## 2017-07-02 NOTE — Op Note (Signed)
Operative Note  Preoperative diagnosis:  1.  Prostate cancer   Postoperative diagnosis: 1.  Prostate cancer  Procedure(s): 1.  Robotic assisted laparoscopic prostatectomy  2.  Bilateral pelvic lymph node dissection  Surgeon: Link Snuffer, MD  Assistants: Dr. Charlett Lango assistant was needed due to the nature of the case being a robotic surgery requiring a bedside assistant for retraction, suctioning, exchanging instruments, etc.   Second assistant: Jonna Clark  Anesthesia: General   Complications: None   EBL: 100 cc   Specimens: 1. prostate and seminal vesicles 2.  Right pelvic lymph node packet 3.  Left pelvic lymph node packet 4.  Periprosthetic fat  Drains/Catheters: 1. 80 French Foley catheter and JP drain   Intraoperative findings: prostate and seminal vesicles were removed en bloc Without any evidence of gross extraprostatic disease  Indication: 57 year old male recently diagnosed with Gleason 4+3 adenocarcinoma of the prostate.  After discussion of different options, the patient elected to undergo the above operation.  Description of procedure:  The patient was identified and consent was obtained.  The patient was taken to the operating room and placed in the supine position.  The patient was placed under general anesthesia.  Perioperative antibiotics were administered.  The patient was placed in dorsal lithotomy.  Patient was prepped and draped in a standard sterile fashion and a timeout was performed.  The patient was placed in steep Trendelenburg.  Foley catheter was placed.  Veress needle was inserted supraumbilical and the drop test was performed with no evidence of any injury.  The abdomen was then insufflated to a pressure of 15.  4 robotic working ports, a 12 mm assistant port, and a 5 mm assistant port were placed under direct visualization in a standard fashion for a robotic pelvic surgery.  Of note, there were some adhesions on the right lateral  abdominal wall from his prior cholecystectomy.  Prior to placing the right-sided 12 mm assistant port, I sharply released/lysed the adhesions.  The abdomen was inspected and there was no evidence of any visceral or vascular injury.  I first released colonic adhesions in the left lower quadrant.  I then retracted the sigmoid and rectum superiorly.  I first started with the posterior dissection by incising along the posterior peritoneum and identifying bilateral vas deferens.  The vas deferens were divided and bilateral seminal vesicles were dissected out carefully.  Denonvier fascia was then entered posteriorly.  The bladder was then dropped by incising along the medial umbilical ligament bilaterally.  Periprosthetic fat was cleared off the prostate and passed off and discarded.  The endopelvic fascia was incised bilaterally and the puboprostatic ligaments were released.  A stapler with a vascular staple load was used to staple the dorsal venous complex.  I then incised along into the bladder neck and divided the bladder neck.  The catheter was brought out and used for retraction.  I divided the remainder of the bladder neck and then pulled the seminal vesicles out of this incision.  Careful dissection was performed and bilateral prostatic pedicles were released using Hem-o-lok clips and sharp dissection.  Spot cautery was sparingly used.  Periprosthetic tissue was carefully released inferiorly and laterally, performing a nerve sparing operation on the left but a wide dissection on the right given his high-grade disease and palpable disease on that side.  Once only the urethra remained attached, the anterior portion of the urethra was divided.  A 2-0 Vicryl stitch was placed at the 6 o'clock position.  The remainder  of the urethra was divided and the prostate was placed in a specimen bag.   FloSeal was applied to the prostatectomy bed.  A baby lap pad was applied for pressure.  I then performed a right-sided  pelvic lymph node dissection removing the pelvic lymph nodes overlying the external iliac artery and vein and down to the level of the obturator nerve.  Careful dissection was performed to avoid any injury to these vital structures.  I passed these off for specimen.  I then performed the same pelvic lymph node dissection on the left and passed these off separately for specimen. The baby lap pad was removed. The 2-0 Vicryl stitch was used to reapproximate the bladder and urethra at the 6 o'clock position.  A running 3-0 V lock suture was then used to reapproximate the bladder and urethra.  The bladder was filled with normal saline and there was no evidence of any leak at the anastomosis.  The anastomosis was watertight.  The Foley catheter was exchanged for a fresh catheter.  A JP drain was inserted from the left lateral port site.  This was secured down with a nylon stitch.  The robot was undocked and the patient was taken out of Trendelenburg.  All working ports were removed under direct visualization with the camera to ensure there was no bleeding from the sites.  The midline port incision was extended and the prostate extracted in the specimen bag.  Interrupted figure-of-eight 0 Vicryl sutures were used to close  the fascia of the midline as well as the 12 mm port.  Skin was then closed with 4-0 Monocryl and Dermabond.  Exparel was used for anesthetic effect.  This concluded the operation.  The patient tolerated procedure well and was stable postoperatively.  Plan: Will obtain stat labs.  He will remain in the hospital overnight and hopefully be able to be discharged tomorrow.  He will keep his catheter for 7-10 days.

## 2017-07-02 NOTE — Progress Notes (Signed)
Pt decreased basal rate of insulin in half on arrival to [facility]. CBG 175. Pt verbalized understanding that he would discontinue insulin pump prior to surgery and would be covered under sliding scale insulin.

## 2017-07-02 NOTE — Care Management Note (Signed)
Case Management Note  Patient Details  Name: Jose Harvey MRN: [id number] Date of Birth: April 11, 1961  Subjective/Objective: 57 y/o m admitted w/Prostate Ca. From home.                   Action/Plan:d/c home.   Expected Discharge Date:  07/03/17               Expected Discharge Plan:  Home/Self Care  In-House Referral:     Discharge planning Services  CM Consult  Post Acute Care Choice:    Choice offered to:     DME Arranged:    DME Agency:     HH Arranged:    HH Agency:     Status of Service:  In process, will continue to follow  If discussed at Long Length of Stay Meetings, dates discussed:    Additional Comments:  Dessa Phi, RN 07/02/2017, 3:48 PM

## 2017-07-02 NOTE — Progress Notes (Signed)
Patient has indigestion.  MD on call was notified.

## 2017-07-02 NOTE — Anesthesia Postprocedure Evaluation (Signed)
Anesthesia Post Note  Patient: ZETHAN ALFIERI  Procedure(s) Performed: XI ROBOTIC ASSISTED LAPAROSCOPIC RADICAL PROSTATECTOMY (N/A ) BILATERAL LYMPHADENECTOMY (Bilateral )     Patient location during evaluation: PACU Anesthesia Type: General Level of consciousness: awake and alert, oriented and patient cooperative Pain management: pain level controlled Vital Signs Assessment: post-procedure vital signs reviewed and stable Respiratory status: spontaneous breathing, nonlabored ventilation, respiratory function stable and patient connected to nasal cannula oxygen Cardiovascular status: blood pressure returned to baseline and stable Postop Assessment: no apparent nausea or vomiting Anesthetic complications: no    Last Vitals:  Vitals:   07/02/17 0559 07/02/17 1209  BP: 137/71   Pulse: 81   Resp: 18 (P) 14  Temp: 36.7 C (P) 36.8 C  SpO2: 98%     Last Pain:  Vitals:   07/02/17 0559  TempSrc: Oral                 Ridge Lafond,E. Bern Fare

## 2017-07-02 NOTE — H&P (Signed)
H&P CC: I have a prostate nodule (S/P TRUSP). HPI: Jose Harvey is a 57 year-old male established patient who is here for a prostate nodule after an ulrasound guided prostate biopsy.  He has not had a prostate infection. He has not seen blood in his stool since the biopsy. The patient has not seen blood in his semen.   PSA 3.99  DRE 30 g with a 5 mm nodule right apex  TRUS size: 27.53 g  Abdominal surgery: Laparoscopic cholecystectomy  Erectile dysfunction: He has mild to moderate and takes Viagra for treatment  No urinary incontinence, only mild lower urinary tract symptoms   Biopsy unfortunately revealed multiple cores 7 out of 12 with adenocarcinoma of the prostate, the highest grade being Gleason 4+3. Multiple other core specimens demonstrated atypia.    Past Medical History:  Diagnosis Date  . Adrenal adenoma, left 03/2017   noted on CT  . Allergic rhinitis, cause unspecified   . Anxiety state, unspecified   . Aortic atherosclerosis ([place]) 03/2017   noted on CT  . BPV (benign positional vertigo)   . COPD (chronic obstructive pulmonary disease) with emphysema (HCC)    mild followed by [facility]   . Coronary artery disease   . Depressive disorder, not elsewhere classified   . Dyspnea    history of  . ED (erectile dysfunction)   . Gallbladder polyp 03/2016   noted on CT  . Hepatic hemangioma 03/2017   noted on CT  . History of kidney stones    hx of years ago   . Indigestion   . 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 Mixed hyperlipidemia   . Prostate cancer ([place])   . 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 Type I (juvenile type) diabetes mellitus without mention of complication, not stated as uncontrolled    type I - followed by Dr Buddy Duty   . Vesicoureteral reflux    Past Surgical History:  Procedure Laterality Date  . CHOLECYSTECTOMY N/A 05/17/2016   Procedure:  LAPAROSCOPIC CHOLECYSTECTOMY;  Surgeon: Clovis Riley, MD;  Location: [facility];  Service: General;  Laterality: N/A;  . cyst removed from throat     . dental surgeries     . PROSTATE BIOPSY    . TONSILLECTOMY    . WISDOM TOOTH EXTRACTION      Home Medications:  Medications Prior to Admission  Medication Sig Dispense Refill Last Dose  . aspirin 81 MG tablet Take 81 mg by mouth daily.    06/25/2017  . budesonide-formoterol (SYMBICORT) 160-4.5 MCG/ACT inhaler Inhale 2 puffs into the lungs 2 (two) times daily. (Patient taking differently: Inhale 2 puffs into the lungs daily. ) 3 Inhaler 2 07/02/2017 at Unknown time  . co-enzyme Q-10 30 MG capsule Take 30 mg by mouth 3 (three) times daily.   06/25/2017 at Unknown time  . Insulin Human (INSULIN PUMP) SOLN Inject into the skin continuous. Uses Novolog in pump Basal rate per patient is as follows: at 12 midnite- .6 , at 0500am-.8 then at 0800am-.6   07/02/2017 at Unknown time  . lisinopril (PRINIVIL,ZESTRIL) 5 MG tablet Take 1 tablet by mouth daily.    07/01/2017 at Unknown time  . Multiple Vitamins-Minerals (ICAPS AREDS 2) CAPS Take 1 capsule by mouth daily.   06/25/2017  . pantoprazole (PROTONIX) 40 MG tablet Take 40 mg by mouth daily.   07/02/2017 at 0400  . rosuvastatin (CRESTOR) 5 MG tablet Take  1 tablet (5 mg total) by mouth daily. 90 tablet 3 07/02/2017 at 0400  . sertraline (ZOLOFT) 100 MG tablet Take 100 mg by mouth daily.   0 07/02/2017 at 0400   Allergies:  Allergies  Allergen Reactions  . Crestor [Rosuvastatin]     Muscle aches  . Lipitor [Atorvastatin]     Muscle aches  . Other Other (See Comments)    Any antihistamines Causes shaking  . Zocor [Simvastatin] Other (See Comments)    Muscle aches     Family History  Problem Relation Age of Onset  . Diabetes type I Father   . Heart attack Father   . Hypertension Father   . Emphysema Father   . CAD Father   . Colon cancer Maternal Grandfather   . Emphysema Paternal Grandfather    . Diabetes type I Paternal Grandfather   . Kidney disease Neg Hx   . Liver disease Neg Hx   . Breast cancer Neg Hx   . Prostate cancer Neg Hx    Social History:  reports that he quit smoking about 9 years ago. His smoking use included cigarettes. He has a 40.50 pack-year smoking history. He quit smokeless tobacco use about 4 years ago. His smokeless tobacco use included snuff. He reports that he drinks about 3.6 oz of alcohol per week. He reports that he does not use drugs.  ROS: A complete review of systems was performed.  All systems are negative except for pertinent findings as noted. ROS   Physical Exam:  Vital signs in last 24 hours: Temp:  [98 F (36.7 C)] 98 F (36.7 C) (01/28 0559) Pulse Rate:  [81] 81 (01/28 0559) Resp:  [18] 18 (01/28 0559) BP: (137)/(71) 137/71 (01/28 0559) SpO2:  [98 %] 98 % (01/28 0559) Weight:  [78.9 kg (174 lb)] 78.9 kg (174 lb) (01/28 0603)  General:  Alert and oriented, No acute distress HEENT: Normocephalic, atraumatic Neck: No JVD or lymphadenopathy Cardiovascular: Regular rate and rhythm Lungs: Regular rate and effort Abdomen: Soft, nontender, nondistended, no abdominal masses Back: No CVA tenderness Extremities: No edema Neurologic: Grossly intact  Laboratory Data:  Results for orders placed or performed during the hospital encounter of 07/02/17 (from the past 24 hour(s))  Glucose, capillary     Status: Abnormal   Collection Time: 07/02/17  5:53 AM  Result Value Ref Range   Glucose-Capillary 175 (H) 65 - 99 mg/dL  Glucose, capillary     Status: Abnormal   Collection Time: 07/02/17  7:06 AM  Result Value Ref Range   Glucose-Capillary 258 (H) 65 - 99 mg/dL   No results found for this or any previous visit (from the past 240 hour(s)). Creatinine: Recent Labs    06/28/17 0800  CREATININE 0.90    Impression/Assessment:  Prostate cancer  Plan:  Proceed with RALP  Marton Redwood, III 07/02/2017, 7:13 AM

## 2017-07-02 NOTE — Progress Notes (Signed)
No enema needed per Dr. Gloriann Loan.

## 2017-07-02 NOTE — Transfer of Care (Signed)
Immediate Anesthesia Transfer of Care Note  Patient: Jose Harvey  Procedure(s) Performed: Procedure(s): XI ROBOTIC ASSISTED LAPAROSCOPIC RADICAL PROSTATECTOMY (N/A) BILATERAL LYMPHADENECTOMY (Bilateral)  Patient Location: PACU  Anesthesia Type:General  Level of Consciousness: Patient easily awoken, sedated, comfortable, cooperative, following commands, responds to stimulation.   Airway & Oxygen Therapy: Patient spontaneously breathing, ventilating well, oxygen via simple oxygen mask.  Post-op Assessment: Report given to PACU RN, vital signs reviewed and stable, moving all extremities.   Post vital signs: Reviewed and stable.  Complications: No apparent anesthesia complications

## 2017-07-02 NOTE — Progress Notes (Signed)
Inpatient Diabetes Program Recommendations  AACE/ADA: New Consensus Statement on Inpatient Glycemic Control (2015)  Target Ranges:  Prepandial:   less than 140 mg/dL      Peak postprandial:   less than 180 mg/dL (1-2 hours)      Critically ill patients:  140 - 180 mg/dL   Lab Results  Component Value Date   GLUCAP 116 (H) 07/02/2017   HGBA1C 6.8 (H) 05/11/2016    Review of Glycemic Control  Type 1 DM On insulin drip. Uses insulin pump at home. Endo: Dr. Buddy Duty    Inpatient Diabetes Program Recommendations:     Restart insulin pump when his supplies and pump are brought in by sister. Spoke with pt and RN regarding discontinuing insulin drip 1-2 hours after insulin pump is started. Pt contract signed and CHO intake/bolus flow-sheet given to record amount of insulin being bolused by patient. RN to complete assessment each shift.  HgbA1C of 6.8% indicates good glycemic control. Will get pump settings when sister brings pump.  Will follow.   Thank you. Lorenda Peck, RD, LDN, CDE Inpatient Diabetes Coordinator [phone]

## 2017-07-03 ENCOUNTER — Encounter (HOSPITAL_COMMUNITY): Payer: Self-pay | Admitting: Urology

## 2017-07-03 DIAGNOSIS — C61 Malignant neoplasm of prostate: Secondary | ICD-10-CM | POA: Diagnosis not present

## 2017-07-03 LAB — GLUCOSE, CAPILLARY
Glucose-Capillary: 178 mg/dL — ABNORMAL HIGH (ref 65–99)
Glucose-Capillary: 147 mg/dL — ABNORMAL HIGH (ref 65–99)

## 2017-07-03 LAB — BASIC METABOLIC PANEL
ANION GAP: 6 (ref 5–15)
BUN: 12 mg/dL (ref 6–20)
CHLORIDE: 107 mmol/L (ref 101–111)
CO2: 25 mmol/L (ref 22–32)
Calcium: 8.2 mg/dL — ABNORMAL LOW (ref 8.9–10.3)
Creatinine, Ser: 0.83 mg/dL (ref 0.61–1.24)
GFR calc Af Amer: >60 mL/min (ref >60)
GFR calc non Af Amer: >60 mL/min (ref >60)
GLUCOSE: 156 mg/dL — AB (ref 65–99)
POTASSIUM: 3.8 mmol/L (ref 3.5–5.1)
Sodium: 138 mmol/L (ref 135–145)

## 2017-07-03 LAB — HEMOGLOBIN AND HEMATOCRIT, BLOOD
HCT: 36.1 % — ABNORMAL LOW (ref 39.0–52.0)
Hemoglobin: 12.2 g/dL — ABNORMAL LOW (ref 13.0–17.0)

## 2017-07-03 MED ORDER — DOCUSATE SODIUM 100 MG PO CAPS: 100.0000 mg | ORAL_CAPSULE | Freq: Two times a day (BID) | ORAL | 0 refills | Status: DC

## 2017-07-03 MED ORDER — ASPIRIN 81 MG PO TABS: 81.0000 mg | ORAL_TABLET | Freq: Every day | ORAL | Status: DC

## 2017-07-03 MED ORDER — OXYCODONE-ACETAMINOPHEN 5-325 MG PO TABS: 1.0000 | ORAL_TABLET | ORAL | 0 refills | Status: DC | PRN

## 2017-07-03 MED ORDER — CIPROFLOXACIN HCL 500 MG PO TABS: 500.0000 mg | ORAL_TABLET | Freq: Once | ORAL | 0 refills | Status: AC

## 2017-07-03 NOTE — Discharge Summary (Signed)
[facility] Discharge Summary  Admit date: 07/02/2017  Discharge date and time: 07/03/17   Discharge to: Home  Discharge Service: Urology   Discharge Attending Physician:  Link Snuffer, MD   Discharge  Diagnoses: Prostate Cancer   Secondary Diagnosis: Active Problems:   Prostate cancer [facility])   OR Procedures: Procedure(s): XI ROBOTIC ASSISTED LAPAROSCOPIC RADICAL PROSTATECTOMY BILATERAL LYMPHADENECTOMY 07/02/2017   Ancillary Procedures: None   Discharge Day Services: The patient was seen and examined by the Urology team both in the morning and immediately prior to discharge.  Vital signs and laboratory values were stable and within normal limits.  The physical exam was benign and unchanged and all surgical wounds were examined.  Discharge instructions were explained and all questions answered.  Subjective  No acute events overnight. Pain Controlled. No fever or chills.  Objective No data found. Total I/O In: -  Out: 800 [Urine:800]  General Appearance:        No acute distress Lungs:                 Normal work of breathing on room air Heart:                             Regular rate and rhythm Abdomen:                       Soft, non-tender, non-distended. JP site dressed appropirately GU   Foley with clear yellow urine in bag  Extremities:                  Warm and well perfused    Hospital Course:  The patient underwent RALP on 07/02/2017.  The patient tolerated the procedure well, was extubated in the OR, and afterwards was taken to the PACU for routine post-surgical care. When stable the patient was transferred to the floor.   The patient did well postoperatively.  [name] diet was slowly advanced and at the time of discharge was tolerating a regular diet. JP drain was removed on POD1 after putting out minimal serosanguinous fluid.  The patient was discharged home 1 Day Post-Op, at which point was tolerating a regular solid diet  have adequate pain control with  P.O. pain medication, and could ambulate without difficulty. The patient will follow up with [place] for post op check in 7-10 days.    Condition at Discharge: Improved  Discharge Medications:   Allergies as of 07/03/2017      Reactions   Crestor [rosuvastatin]    Muscle aches   Lipitor [atorvastatin]    Muscle aches   Other Other (See Comments)   Any antihistamines Causes shaking   Zocor [simvastatin] Other (See Comments)   Muscle aches      Medication List    TAKE these medications   aspirin 81 MG tablet Take 1 tablet (81 mg total) by mouth daily. Start taking on:  07/07/2017 What changed:  These instructions start on 07/07/2017. If you are unsure what to do until then, ask your doctor or other care provider.   budesonide-formoterol 160-4.5 MCG/ACT inhaler Commonly known as:  SYMBICORT Inhale 2 puffs into the lungs 2 (two) times daily. What changed:  when to take this   ciprofloxacin 500 MG tablet Commonly known as:  CIPRO Take 1 tablet (500 mg total) by mouth once for 1 dose. On AM of follow up appointment   co-enzyme Q-10 30 MG capsule Take  30 mg by mouth 3 (three) times daily.   docusate sodium 100 MG capsule Commonly known as:  COLACE Take 1 capsule (100 mg total) by mouth 2 (two) times daily.   ICAPS AREDS 2 Caps Take 1 capsule by mouth daily.   insulin pump Soln Inject into the skin continuous. Uses Novolog in pump Basal rate per patient is as follows: at 12 midnite- .6 , at 0500am-.8 then at 0800am-.6   lisinopril 5 MG tablet Commonly known as:  PRINIVIL,ZESTRIL Take 1 tablet by mouth daily.   oxyCODONE-acetaminophen 5-325 MG tablet Commonly known as:  PERCOCET/ROXICET Take 1-2 tablets by mouth every 4 (four) hours as needed for severe pain.   pantoprazole 40 MG tablet Commonly known as:  PROTONIX Take 40 mg by mouth daily.   rosuvastatin 5 MG tablet Commonly known as:  CRESTOR Take 1 tablet (5 mg total) by mouth daily.   sertraline 100 MG  tablet Commonly known as:  ZOLOFT Take 100 mg by mouth daily.        Pending Test Results: Surgical pathology   Discharge Instructions:  Discharge Instructions    Call MD for:  persistant nausea and vomiting   Complete by:  As directed    Call MD for:  redness, tenderness, or signs of infection (pain, swelling, redness, odor or green/yellow discharge around incision site)   Complete by:  As directed    Call MD for:  severe uncontrolled pain   Complete by:  As directed    Call MD for:  temperature >100.4   Complete by:  As directed    Diet - low sodium heart healthy   Complete by:  As directed    Discharge instructions   Complete by:  As directed    Discharge instructions   Complete by:  As directed    Activity:  You are encouraged to ambulate frequently (about every hour during waking hours) to help prevent blood clots from forming in your legs or lungs.  However, you should not engage in any heavy lifting (> 10-15 lbs), strenuous activity, or straining. Diet: You should advance your diet as instructed by your physician.  It will be normal to have some bloating, nausea, and abdominal discomfort intermittently. Prescriptions:  You will be provided a prescription for pain medication to take as needed.  If your pain is not severe enough to require the prescription pain medication, you may take extra strength Tylenol instead which will have less side effects.  You should also take a prescribed stool softener to avoid straining with bowel movements as the prescription pain medication may constipate you. Incisions: You may remove your dressing bandages 48 hours after surgery if not removed in the hospital.  The site where your drain was will close on its own in the next several days. It may leak a little before it closes. Change the dressing as needed when soiled. You will either have some small staples or special tissue glue at each of the incision sites. Once the bandages are removed (if  present), the incisions may stay open to air.  You may start showering (but not soaking or bathing in water) the 2nd day after surgery and the incisions simply need to be patted dry after the shower.  No additional care is needed. What to call us about: You should call the office [phone]) if you develop fever > 101 or develop persistent vomiting.   Driving Restrictions   Complete by:  As directed    No  driving while taking narcotic pain medicine   Increase activity slowly   Complete by:  As directed    Lifting restrictions   Complete by:  As directed    10-15 pounds for 4-6 weeks

## 2017-07-30 DIAGNOSIS — C61 Malignant neoplasm of prostate: Secondary | ICD-10-CM | POA: Diagnosis not present

## 2017-07-30 DIAGNOSIS — M62838 Other muscle spasm: Secondary | ICD-10-CM | POA: Diagnosis not present

## 2017-07-30 DIAGNOSIS — M6281 Muscle weakness (generalized): Secondary | ICD-10-CM | POA: Diagnosis not present

## 2017-08-09 DIAGNOSIS — C61 Malignant neoplasm of prostate: Secondary | ICD-10-CM | POA: Diagnosis not present

## 2017-08-13 DIAGNOSIS — M62838 Other muscle spasm: Secondary | ICD-10-CM | POA: Diagnosis not present

## 2017-08-13 DIAGNOSIS — M6281 Muscle weakness (generalized): Secondary | ICD-10-CM | POA: Diagnosis not present

## 2017-08-13 DIAGNOSIS — C61 Malignant neoplasm of prostate: Secondary | ICD-10-CM | POA: Diagnosis not present

## 2017-08-16 DIAGNOSIS — N393 Stress incontinence (female) (male): Secondary | ICD-10-CM | POA: Diagnosis not present

## 2017-08-31 DIAGNOSIS — Z9641 Presence of insulin pump (external) (internal): Secondary | ICD-10-CM | POA: Diagnosis not present

## 2017-08-31 DIAGNOSIS — E1065 Type 1 diabetes mellitus with hyperglycemia: Secondary | ICD-10-CM | POA: Diagnosis not present

## 2017-08-31 DIAGNOSIS — Z794 Long term (current) use of insulin: Secondary | ICD-10-CM | POA: Diagnosis not present

## 2017-09-22 DIAGNOSIS — J029 Acute pharyngitis, unspecified: Secondary | ICD-10-CM | POA: Diagnosis not present

## 2017-09-22 DIAGNOSIS — H9201 Otalgia, right ear: Secondary | ICD-10-CM | POA: Diagnosis not present

## 2017-10-02 DIAGNOSIS — Z794 Long term (current) use of insulin: Secondary | ICD-10-CM | POA: Diagnosis not present

## 2017-10-02 DIAGNOSIS — E103293 Type 1 diabetes mellitus with mild nonproliferative diabetic retinopathy without macular edema, bilateral: Secondary | ICD-10-CM | POA: Diagnosis not present

## 2017-10-03 DIAGNOSIS — H9201 Otalgia, right ear: Secondary | ICD-10-CM | POA: Insufficient documentation

## 2017-10-03 DIAGNOSIS — M26629 Arthralgia of temporomandibular joint, unspecified side: Secondary | ICD-10-CM | POA: Insufficient documentation

## 2017-10-03 DIAGNOSIS — M26621 Arthralgia of right temporomandibular joint: Secondary | ICD-10-CM | POA: Diagnosis not present

## 2017-11-05 DIAGNOSIS — C61 Malignant neoplasm of prostate: Secondary | ICD-10-CM | POA: Diagnosis not present

## 2017-11-06 DIAGNOSIS — D225 Melanocytic nevi of trunk: Secondary | ICD-10-CM | POA: Diagnosis not present

## 2017-11-06 DIAGNOSIS — L570 Actinic keratosis: Secondary | ICD-10-CM | POA: Diagnosis not present

## 2017-11-06 DIAGNOSIS — L821 Other seborrheic keratosis: Secondary | ICD-10-CM | POA: Diagnosis not present

## 2017-11-06 DIAGNOSIS — D2362 Other benign neoplasm of skin of left upper limb, including shoulder: Secondary | ICD-10-CM | POA: Diagnosis not present

## 2017-11-16 DIAGNOSIS — N393 Stress incontinence (female) (male): Secondary | ICD-10-CM | POA: Diagnosis not present

## 2017-11-16 DIAGNOSIS — C61 Malignant neoplasm of prostate: Secondary | ICD-10-CM | POA: Diagnosis not present

## 2017-11-29 DIAGNOSIS — Z794 Long term (current) use of insulin: Secondary | ICD-10-CM | POA: Diagnosis not present

## 2017-11-29 DIAGNOSIS — E1065 Type 1 diabetes mellitus with hyperglycemia: Secondary | ICD-10-CM | POA: Diagnosis not present

## 2017-11-29 DIAGNOSIS — Z9641 Presence of insulin pump (external) (internal): Secondary | ICD-10-CM | POA: Diagnosis not present

## 2018-02-12 DIAGNOSIS — E103293 Type 1 diabetes mellitus with mild nonproliferative diabetic retinopathy without macular edema, bilateral: Secondary | ICD-10-CM | POA: Diagnosis not present

## 2018-02-12 DIAGNOSIS — H2513 Age-related nuclear cataract, bilateral: Secondary | ICD-10-CM | POA: Diagnosis not present

## 2018-02-12 DIAGNOSIS — H35033 Hypertensive retinopathy, bilateral: Secondary | ICD-10-CM | POA: Diagnosis not present

## 2018-02-15 DIAGNOSIS — C61 Malignant neoplasm of prostate: Secondary | ICD-10-CM | POA: Diagnosis not present

## 2018-02-22 DIAGNOSIS — N393 Stress incontinence (female) (male): Secondary | ICD-10-CM | POA: Diagnosis not present

## 2018-02-22 DIAGNOSIS — C61 Malignant neoplasm of prostate: Secondary | ICD-10-CM | POA: Diagnosis not present

## 2018-03-01 DIAGNOSIS — E1065 Type 1 diabetes mellitus with hyperglycemia: Secondary | ICD-10-CM | POA: Diagnosis not present

## 2018-03-01 DIAGNOSIS — Z9641 Presence of insulin pump (external) (internal): Secondary | ICD-10-CM | POA: Diagnosis not present

## 2018-03-01 DIAGNOSIS — Z794 Long term (current) use of insulin: Secondary | ICD-10-CM | POA: Diagnosis not present

## 2018-03-12 ENCOUNTER — Other Ambulatory Visit: Payer: Self-pay | Admitting: *Deleted

## 2018-03-12 DIAGNOSIS — R911 Solitary pulmonary nodule: Secondary | ICD-10-CM

## 2018-03-12 DIAGNOSIS — Z87891 Personal history of nicotine dependence: Secondary | ICD-10-CM

## 2018-03-28 ENCOUNTER — Ambulatory Visit (INDEPENDENT_AMBULATORY_CARE_PROVIDER_SITE_OTHER)
Admission: RE | Admit: 2018-03-28 | Discharge: 2018-03-28 | Disposition: A | Discharge status: Home / Self Care | Payer: 59 | Source: Ambulatory Visit | Attending: Pulmonary Disease | Admitting: Pulmonary Disease

## 2018-03-28 ENCOUNTER — Ambulatory Visit: Payer: 59

## 2018-03-28 DIAGNOSIS — Z87891 Personal history of nicotine dependence: Secondary | ICD-10-CM

## 2018-04-01 DIAGNOSIS — Z23 Encounter for immunization: Secondary | ICD-10-CM | POA: Diagnosis not present

## 2018-04-01 DIAGNOSIS — Z0000 Encounter for general adult medical examination without abnormal findings: Secondary | ICD-10-CM | POA: Diagnosis not present

## 2018-04-01 DIAGNOSIS — E103293 Type 1 diabetes mellitus with mild nonproliferative diabetic retinopathy without macular edema, bilateral: Secondary | ICD-10-CM | POA: Diagnosis not present

## 2018-04-01 DIAGNOSIS — E785 Hyperlipidemia, unspecified: Secondary | ICD-10-CM | POA: Diagnosis not present

## 2018-04-02 DIAGNOSIS — Z794 Long term (current) use of insulin: Secondary | ICD-10-CM | POA: Diagnosis not present

## 2018-04-02 DIAGNOSIS — E103293 Type 1 diabetes mellitus with mild nonproliferative diabetic retinopathy without macular edema, bilateral: Secondary | ICD-10-CM | POA: Diagnosis not present

## 2018-04-09 ENCOUNTER — Other Ambulatory Visit: Payer: Self-pay

## 2018-04-09 DIAGNOSIS — R918 Other nonspecific abnormal finding of lung field: Secondary | ICD-10-CM

## 2018-05-16 ENCOUNTER — Other Ambulatory Visit: Payer: Self-pay | Admitting: Acute Care

## 2018-05-16 DIAGNOSIS — Z122 Encounter for screening for malignant neoplasm of respiratory organs: Secondary | ICD-10-CM

## 2018-05-16 DIAGNOSIS — Z87891 Personal history of nicotine dependence: Secondary | ICD-10-CM

## 2018-05-16 DIAGNOSIS — C61 Malignant neoplasm of prostate: Secondary | ICD-10-CM | POA: Diagnosis not present

## 2018-05-23 DIAGNOSIS — C61 Malignant neoplasm of prostate: Secondary | ICD-10-CM | POA: Diagnosis not present

## 2018-05-26 DIAGNOSIS — Z9641 Presence of insulin pump (external) (internal): Secondary | ICD-10-CM | POA: Diagnosis not present

## 2018-05-26 DIAGNOSIS — E1065 Type 1 diabetes mellitus with hyperglycemia: Secondary | ICD-10-CM | POA: Diagnosis not present

## 2018-05-26 DIAGNOSIS — Z794 Long term (current) use of insulin: Secondary | ICD-10-CM | POA: Diagnosis not present

## 2018-05-31 DIAGNOSIS — Z794 Long term (current) use of insulin: Secondary | ICD-10-CM | POA: Diagnosis not present

## 2018-05-31 DIAGNOSIS — E103293 Type 1 diabetes mellitus with mild nonproliferative diabetic retinopathy without macular edema, bilateral: Secondary | ICD-10-CM | POA: Diagnosis not present

## 2018-08-09 DIAGNOSIS — C61 Malignant neoplasm of prostate: Secondary | ICD-10-CM | POA: Diagnosis not present

## 2018-08-26 DIAGNOSIS — N393 Stress incontinence (female) (male): Secondary | ICD-10-CM | POA: Diagnosis not present

## 2018-08-26 DIAGNOSIS — C61 Malignant neoplasm of prostate: Secondary | ICD-10-CM | POA: Diagnosis not present

## 2018-08-26 DIAGNOSIS — Z794 Long term (current) use of insulin: Secondary | ICD-10-CM | POA: Diagnosis not present

## 2018-08-26 DIAGNOSIS — E1065 Type 1 diabetes mellitus with hyperglycemia: Secondary | ICD-10-CM | POA: Diagnosis not present

## 2018-08-26 DIAGNOSIS — Z9641 Presence of insulin pump (external) (internal): Secondary | ICD-10-CM | POA: Diagnosis not present

## 2018-09-19 ENCOUNTER — Telehealth: Payer: Self-pay | Admitting: Pulmonary Disease

## 2018-09-19 NOTE — Telephone Encounter (Signed)
Received staff message from [facility].  It is time to schedule follow up CT lung cancer screening (6 mo f/u).  CT called to let us know results from 03/2018,  CT lung cancer screening results were never given to Patient.  CT results from last CT need to be called to Patient, before new CT is scheduled. Called and left message on VM for Patient to call office.  Per Dr Vaughan Browner-   Notes recorded by Marshell Garfinkel, MD on 04/05/2018 at 9:30 AM EDT Please let patient know that CT shows increase in lung nodules. We need to continue to monitor this Order CT CHEST LCS NODULE FOLLOW-UP W/O CM in 6 months  In addition there are findings of benign hemangioma and adenoma lesion in the liver and adrenal gland. There is evidence of emphysema and aortic, coronary atherosclerosis. He can follow with his PCP and cardiologist regarding this.   Make sure he has clinic follow-up after CT scan as he has not been seen in clinic since 2017.

## 2018-09-23 NOTE — Telephone Encounter (Signed)
Attempted to call patient today regarding results. I did not receive an answer at time of call. I have left a voicemail message for pt to return call. X2  

## 2018-09-27 NOTE — Telephone Encounter (Signed)
Called pt to confirm appt 09/30/18  No Cough No contact No Fever No Travel

## 2018-09-30 ENCOUNTER — Ambulatory Visit (INDEPENDENT_AMBULATORY_CARE_PROVIDER_SITE_OTHER)
Admission: RE | Admit: 2018-09-30 | Discharge: 2018-09-30 | Disposition: A | Discharge status: Home / Self Care | Payer: 59 | Source: Ambulatory Visit | Attending: Acute Care | Admitting: Acute Care

## 2018-09-30 ENCOUNTER — Other Ambulatory Visit: Payer: Self-pay

## 2018-09-30 DIAGNOSIS — Z122 Encounter for screening for malignant neoplasm of respiratory organs: Secondary | ICD-10-CM | POA: Diagnosis not present

## 2018-09-30 DIAGNOSIS — R918 Other nonspecific abnormal finding of lung field: Secondary | ICD-10-CM | POA: Diagnosis not present

## 2018-09-30 DIAGNOSIS — Z87891 Personal history of nicotine dependence: Secondary | ICD-10-CM | POA: Diagnosis not present

## 2018-10-02 DIAGNOSIS — E103293 Type 1 diabetes mellitus with mild nonproliferative diabetic retinopathy without macular edema, bilateral: Secondary | ICD-10-CM | POA: Diagnosis not present

## 2018-10-02 DIAGNOSIS — Z794 Long term (current) use of insulin: Secondary | ICD-10-CM | POA: Diagnosis not present

## 2018-10-14 ENCOUNTER — Telehealth: Payer: Self-pay | Admitting: Acute Care

## 2018-10-14 NOTE — Telephone Encounter (Signed)
I have attempted to call the patient with the results of his low dose CT. There was no answer. I have left a message requesting the patient call the office for his results. I have left the office contact information. I will await a return call.

## 2018-10-18 ENCOUNTER — Other Ambulatory Visit: Payer: Self-pay | Admitting: Acute Care

## 2018-10-18 DIAGNOSIS — Z122 Encounter for screening for malignant neoplasm of respiratory organs: Secondary | ICD-10-CM

## 2018-10-18 DIAGNOSIS — Z87891 Personal history of nicotine dependence: Secondary | ICD-10-CM

## 2019-04-08 ENCOUNTER — Telehealth: Payer: Self-pay | Admitting: Acute Care

## 2019-04-08 ENCOUNTER — Other Ambulatory Visit: Payer: Self-pay

## 2019-04-08 ENCOUNTER — Ambulatory Visit (INDEPENDENT_AMBULATORY_CARE_PROVIDER_SITE_OTHER)
Admission: RE | Admit: 2019-04-08 | Discharge: 2019-04-08 | Disposition: A | Discharge status: Home / Self Care | Payer: 59 | Source: Ambulatory Visit | Attending: Acute Care | Admitting: Acute Care

## 2019-04-08 DIAGNOSIS — Z122 Encounter for screening for malignant neoplasm of respiratory organs: Secondary | ICD-10-CM

## 2019-04-08 DIAGNOSIS — R918 Other nonspecific abnormal finding of lung field: Secondary | ICD-10-CM | POA: Diagnosis not present

## 2019-04-08 DIAGNOSIS — Z87891 Personal history of nicotine dependence: Secondary | ICD-10-CM

## 2019-04-08 NOTE — Telephone Encounter (Signed)
Received call report for pts LDCT that was done on 04/08/19. Impression is below.   IMPRESSION: 1. Masslike area of ground-glass straddles the minor fissure with septal thickening and fissural retraction. While the lesion has increased in size minimally from 09/30/2018, there is clear enlargement from baseline examination on 02/16/2016. Therefore, lesion is characterized as worrisome for adenocarcinoma, Lung-RADS 4B, suspicious. Additional imaging evaluation or consultation with Pulmonology or Thoracic Surgery recommended. These results will be called to the ordering clinician or representative by the Radiologist Assistant, and communication documented in the PACS or zVision Dashboard. 2. Liver hemangiomas and left adrenal adenoma, best characterized on 03/03/2016. 3. Aortic atherosclerosis (ICD10-170.0). Coronary artery calcification.     Please advise. Thanks.

## 2019-04-09 NOTE — Telephone Encounter (Signed)
Thanks so much. Will try to get him on the [facility] schedule.

## 2019-04-09 NOTE — Telephone Encounter (Signed)
I have called Jose Harvey with the results of his CT scan.  I explained that his scan was read as a Lung RADS 4 B indicates suspicious findings for which additional diagnostic testing and or tissue sampling is recommended.  I explained the area of concern that we have been following since 2017 appears to be slowly growing.  We discussed that he had had a PET scan in 2017 which showed just low uptake at the time. I plan to discuss this with Dr. Lamonte Sakai to evaluate if the next step should be another PET scan. [name] stated that he is fine with another PET scan if that is what we feel we need to do. I told him I will give him a call once I have spoken with Dr. Lamonte Sakai, or Dr. Valeta Harms, regarding next steps. He verbalized understanding and had no further questions upon completion of the call. This is a patient of Dr. Vaughan Browner, therefore I have added him to the results. I have messaged Dr. Lamonte Sakai, and will forward  the results of the scan to him for review. Denise please put on tickler list for close follow-up next week so that we can make sure a decision has been made and follow-up diagnostics ordered, and that we have followed up with [name] regarding these. Thanks so much

## 2019-04-09 NOTE — Telephone Encounter (Signed)
Reviewed his CT scan and his prior scans, PET scan from 2017.  Clearly the area of groundglass is larger, there may be a more solid central component as well.  His pulmonary function testing shows an FEV1 of 3 L, 76% predicted.  If he does not have any other contraindications the now consider him to be a good candidate for referral to thoracic surgery for possible primary resection, would be a right upper lobectomy. May be reasonable to repeat his PET scan but even if there is only mild hypermetabolism I think he needs either tissue diagnosis or primary resection.  I would support going straight to resection if the patient agrees, surgery agrees.

## 2019-04-10 ENCOUNTER — Other Ambulatory Visit: Payer: Self-pay | Admitting: Emergency Medicine

## 2019-04-10 ENCOUNTER — Other Ambulatory Visit: Payer: Self-pay | Admitting: Acute Care

## 2019-04-10 DIAGNOSIS — R911 Solitary pulmonary nodule: Secondary | ICD-10-CM

## 2019-04-10 NOTE — Telephone Encounter (Signed)
I have called Jose Harvey. He is aware of the recommendations of the [facility]. He understands he needs PFT's and COVID testing.He has follow up with you 11/10, and hopefully you will have PFT's to review at that appointment.I have also sent a referral to [facility]. Dr. Roxan Hockey.Let me know if there is anything  [name] I can do. Thanks so much

## 2019-04-10 NOTE — Telephone Encounter (Signed)
Jose Harvey and Jose Harvey- Thanks a lot for taking a look at this. Agree with the plan.  May need to arrange repeat PFTs depending on [facility] discussion.   [name]- Can you make follow up in clinic with me to discuss with patient.

## 2019-04-10 NOTE — Progress Notes (Signed)
I have called Jose Harvey with the recommendations of the [facility].  I explained to him that several members of the clinic evaluated his CT scan and felt that the groundglass area of concern noted in his RML and RLL  should be removed.  I explained that I am going to place an order for pulmonary function tests and that he will need to have a Covid test 3 days prior to the PFTs.  We are ordering the PFTs for November 10 prior to his 1:45 appointment with Dr. Vaughan Browner.  I have called the patient care coordinator and they are working currently on scheduling the pulmonary function test, and the Covid test that is required prior. I explained to Jose Harvey that I have also sent a referral to cardiovascular thoracic surgery for an appointment to be evaluated.  The patient states that he would prefer to get this done at the end of November if at all possible. He verbalized understanding of the above, and understands that we will call him to schedule the pulmonary function test, the Covid testing, and that he will get a call from cardiovascular thoracic surgery to set up a consultation with Dr. Roxan Hockey. He is scheduled to see Dr. Vaughan Browner April 15, 2019 at 1:45 PM.  The patient care coordinators are working on scheduling PFTs and Covid testing.

## 2019-04-10 NOTE — Telephone Encounter (Signed)
I called and spoke with the patient and made him aware that he needs a return visit. I have scheduled him for 04/15/19 at 1:45pm. Nothing further is needed.

## 2019-04-11 ENCOUNTER — Other Ambulatory Visit: Payer: Self-pay | Admitting: *Deleted

## 2019-04-11 NOTE — Progress Notes (Signed)
The purposed treatment plan is for discussion purpose only and is not a binding recommendation.  Patient was not physically examined nor present for treatment options.  Therefore, final treatment plans cannot be decided.

## 2019-04-14 ENCOUNTER — Encounter: Payer: Self-pay | Admitting: *Deleted

## 2019-04-14 NOTE — Progress Notes (Signed)
Oncology Nurse Navigator Documentation  Oncology Nurse Navigator Flowsheets 04/14/2019  Abnormal Finding Date -  Confirmed Diagnosis Date -  Expected Surgery Date -  Navigator Location [facility]  Navigator Encounter Type -  Telephone -  Treatment Phase Abnormal Scans  Barriers/Navigation Needs Coordination of Care/I followed up on patient's appt with thoracic surgery.  This is set for this week.  No other needs at this time.   Interventions Coordination of Care  Acuity Level 2-Minimal Needs (1-2 Barriers Identified)  Coordination of Care Other  Time Spent with Patient 30

## 2019-04-15 ENCOUNTER — Encounter: Payer: Self-pay | Admitting: Pulmonary Disease

## 2019-04-15 ENCOUNTER — Other Ambulatory Visit: Payer: Self-pay

## 2019-04-15 ENCOUNTER — Ambulatory Visit (INDEPENDENT_AMBULATORY_CARE_PROVIDER_SITE_OTHER): Payer: 59 | Admitting: Pulmonary Disease

## 2019-04-15 VITALS — BP 122/80 | HR 71 | Temp 97.3°F | Ht 71.0 in | Wt 180.4 lb

## 2019-04-15 DIAGNOSIS — R918 Other nonspecific abnormal finding of lung field: Secondary | ICD-10-CM

## 2019-04-15 DIAGNOSIS — Z87891 Personal history of nicotine dependence: Secondary | ICD-10-CM

## 2019-04-15 NOTE — Patient Instructions (Signed)
I have reviewed the CT scan which shows opacity in the right lung which has slowly increased compared to 2017 You have a follow-up appointment with Dr. Roxan Hockey to evaluate if this can be removed surgically  Follow-up in 1 to 2 months

## 2019-04-15 NOTE — Progress Notes (Signed)
Jose Harvey    [id number]    Jan 26, 1961  Primary Care Physician:Harvey, Jose Spies, MD  Referring Physician: Leeroy Cha, MD [address] [place],  [place] [address]  Chief complaint:   Follow up for COPD, abnormal CT  HPI: Jose Harvey is a 58 year old former heavy smoker with history of diabetes, hypertension, hyperlipidemia. Quit smoking in 2010  Last seen in pulmonary clinic in 2017.  At that time he was being followed for lung nodules and right middle lobe groundglass opacity with no uptake on PET scan. He has follow-up CTs which have shown stable lung nodules but the right middle lobe groundglass opacity has grown in size. Discussed multidisciplinary tumor board and recommendation made for surgery evaluation and suitability for resection He has an appointment with Jose Harvey this week and PFTs pending next week  Overall his breathing has remained stable with no issues.  Denies any cough, sputum production, fevers, chills He is not using his inhalers as he feels he does not need them.  Outpatient Encounter Medications as of 04/15/2019  Medication Sig  . aspirin 81 MG tablet Take 1 tablet (81 mg total) by mouth daily.  Marland Kitchen co-enzyme Q-10 30 MG capsule Take 30 mg by mouth 3 (three) times daily.  . Insulin Human (INSULIN PUMP) SOLN Inject into the skin continuous. Uses Novolog in pump Basal rate per patient is as follows: at 12 midnite- .6 , at 0500am-.8 then at 0800am-.6  . lisinopril (PRINIVIL,ZESTRIL) 5 MG tablet Take 1 tablet by mouth daily.   . Multiple Vitamins-Minerals (ICAPS AREDS 2) CAPS Take 1 capsule by mouth daily.  . pantoprazole (PROTONIX) 40 MG tablet Take 40 mg by mouth daily.  . rosuvastatin (CRESTOR) 5 MG tablet Take 1 tablet (5 mg total) by mouth daily.  . sertraline (ZOLOFT) 100 MG tablet Take 100 mg by mouth daily.   . budesonide-formoterol (SYMBICORT) 160-4.5 MCG/ACT inhaler Inhale 2 puffs into the lungs 2 (two) times  daily. (Patient not taking: Reported on 04/15/2019)  . [DISCONTINUED] docusate sodium (COLACE) 100 MG capsule Take 1 capsule (100 mg total) by mouth 2 (two) times daily.  . [DISCONTINUED] oxyCODONE-acetaminophen (PERCOCET/ROXICET) 5-325 MG tablet Take 1-2 tablets by mouth every 4 (four) hours as needed for severe pain.   No facility-administered encounter medications on file as of 04/15/2019.    Physical Exam: Blood pressure 122/80, pulse 71, temperature (!) 97.3 F (36.3 C), temperature source Temporal, height 5\' 11"  (1.803 m), weight 180 lb 6.4 oz (81.8 kg), SpO2 98 %. Gen:      No acute distress HEENT:  EOMI, sclera anicteric Neck:     No masses; no thyromegaly Lungs:    Clear to auscultation bilaterally; normal respiratory effort CV:         Regular rate and rhythm; no murmurs Abd:      + bowel sounds; soft, non-tender; no palpable masses, no distension Ext:    No edema; adequate peripheral perfusion Skin:      Warm and dry; no rash Neuro: alert and oriented x 3 Psych: normal mood and affect  Data Reviewed: Imaging Low dose CT chest 02/16/16 Subcentimeter pulmonary nodules, largest one 8.3 mm in the right lower lobe.  Groundglass opacity of the right middle lobe.  Poorly characterized lesions in the liver and adrenal gland.Mild diffuse bronchial wall thickening with very mild centrilobular and paraseptal emphysema. Images reviewed.  PET scan  02/25/16 Low-grade PET uptake in the right lower lobe  nodule. No uptake in the liver, mild uptake in the adrenal mass. Images reviewed.  Low-dose CT 04/08/2019-increasing size of groundglass opacity in the right middle lobe straddling the minor fissure.  I have reviewed the images personally.  PFTs 07/30/15 FVC 2.87 [4%), FEV1 2.03 [76%), F/F 78, TLC 79%, DLCO 71% Minimal restrictive process, reduction in DLCO.  Assessment:  Abnormal CT, groundglass opacity CT scans reviewed with increasing size of groundglass opacity in the right middle lobe  concerning for slow-growing adenocarcinoma Case was discussed at [facility] and recommendation to evaluate for surgery He has appointment with Dr. Roxan Harvey 11/12 and PFTs next week  Mild COPD PFTs do not show any overt obstruction but he likely has COPD based on his smoking history, CT findings of emphysema. Does not need inhalers as he does not have symptoms of dyspnea.  Plan/Recommendations: - PFTs, surgery eval for possible resection  Jose Garfinkel MD [facility] 04/15/2019, 1:57 PM  CC: Jose Harvey,*

## 2019-04-16 ENCOUNTER — Encounter: Payer: 59 | Admitting: Thoracic Surgery (Cardiothoracic Vascular Surgery)

## 2019-04-17 ENCOUNTER — Institutional Professional Consult (permissible substitution) (INDEPENDENT_AMBULATORY_CARE_PROVIDER_SITE_OTHER): Payer: 59 | Admitting: Thoracic Surgery (Cardiothoracic Vascular Surgery)

## 2019-04-17 ENCOUNTER — Encounter: Payer: Self-pay | Admitting: *Deleted

## 2019-04-17 ENCOUNTER — Encounter: Payer: 59 | Admitting: Thoracic Surgery (Cardiothoracic Vascular Surgery)

## 2019-04-17 ENCOUNTER — Encounter: Payer: Self-pay | Admitting: Thoracic Surgery (Cardiothoracic Vascular Surgery)

## 2019-04-17 ENCOUNTER — Other Ambulatory Visit: Payer: Self-pay

## 2019-04-17 VITALS — BP 149/78 | HR 80 | Temp 97.5°F | Resp 16 | Ht 71.0 in | Wt 182.3 lb

## 2019-04-17 DIAGNOSIS — R911 Solitary pulmonary nodule: Secondary | ICD-10-CM

## 2019-04-17 NOTE — H&P (View-Only) (Signed)
PCP is Leeroy Cha, MD Referring Provider is Magdalen Spatz, NP  Chief Complaint  Patient presents with  . Lung Lesion    Surgical eval, Chest CT 04/08/19, PFT's 04/10/19    HPI: Mr. Jose Harvey is sent for consultation regarding a right lung opacity.  Jodie Rigley is a 58 year old man with a history of type 1 diabetes, tobacco abuse (2 packs/day for 30 years prior to quitting 10 years ago), COPD, vertigo, left adrenal adenoma, prostate cancer, and anxiety.  He started the lung cancer screening program about 3 years ago.  In 2017 his CT showed multiple small pulmonary nodules.  There was a groundglass attenuation in the right middle lobe with a mean diameter of 20.5 mm.  A PET/CT showed no significant metabolic activity.  He was also noted to have some hemangiomas in the liver and left adrenal adenoma.  He is remained in the screening program since then.  His most recent CT was on 04/08/2019.  The groundglass opacity in the right middle lobe now cross the fissure and involve the inferior aspect of the right upper lobe the mean diameter was up to 49 mm and there also was an area of possible solid component more centrally.  He is now referred for surgical resection  Zubrod Score: At the time of surgery this patient's most appropriate activity status/level should be described as: [x]     0    Normal activity, no symptoms []     1    Restricted in physical strenuous activity but ambulatory, able to do out light work []     2    Ambulatory and capable of self care, unable to do work activities, up and about >50 % of waking hours                              []     3    Only limited self care, in bed greater than 50% of waking hours []     4    Completely disabled, no self care, confined to bed or chair []     5    Moribund  Past Medical History:  Diagnosis Date  . Adrenal adenoma, left 03/2017   noted on CT  . Allergic rhinitis, cause unspecified   . Anxiety state, unspecified   . Aortic  atherosclerosis ([facility]) 03/2017   noted on CT  . BPV (benign positional vertigo)   . COPD (chronic obstructive pulmonary disease) with emphysema ([facility])    mild followed by [facility]   . Coronary artery disease   . Depressive disorder, not elsewhere classified   . Dyspnea    history of  . ED (erectile dysfunction)   . Gallbladder polyp 03/2016   noted on CT  . Hepatic hemangioma 03/2017   noted on CT  . History of kidney stones    hx of years ago   . Indigestion   . 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 Mixed hyperlipidemia   . Prostate cancer ([facility])   . 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 Type I (juvenile type) diabetes mellitus without mention of complication, not stated as uncontrolled    type I - followed by Dr Buddy Duty   . Vesicoureteral reflux     Past Surgical History:  Procedure Laterality Date  . CHOLECYSTECTOMY N/A 05/17/2016   Procedure: LAPAROSCOPIC CHOLECYSTECTOMY;  Surgeon: Clovis Riley, MD;  Location:  [facility];  Service: General;  Laterality: N/A;  . cyst removed from throat     . dental surgeries     . LYMPHADENECTOMY Bilateral 07/02/2017   Procedure: BILATERAL LYMPHADENECTOMY;  Surgeon: Lucas Mallow, MD;  Location: [facility];  Service: Urology;  Laterality: Bilateral;  . PROSTATE BIOPSY    . ROBOT ASSISTED LAPAROSCOPIC RADICAL PROSTATECTOMY N/A 07/02/2017   Procedure: XI ROBOTIC ASSISTED LAPAROSCOPIC RADICAL PROSTATECTOMY;  Surgeon: Lucas Mallow, MD;  Location: [facility];  Service: Urology;  Laterality: N/A;  . TONSILLECTOMY    . WISDOM TOOTH EXTRACTION      Family History  Problem Relation Age of Onset  . Diabetes type I Father   . Heart attack Father   . Hypertension Father   . Emphysema Father   . CAD Father   . Colon cancer Maternal Grandfather   . Emphysema Paternal Grandfather   . Diabetes type I Paternal Grandfather   . Kidney disease Neg Hx   .  Liver disease Neg Hx   . Breast cancer Neg Hx   . Prostate cancer Neg Hx     Social History Social History   Tobacco Use  . Smoking status: Former Smoker    Packs/day: 1.50    Years: 27.00    Pack years: 40.50    Types: Cigarettes    Quit date: 06/05/2008    Years since quitting: 10.8  . Smokeless tobacco: Former Systems developer    Types: Snuff    Quit date: 2015  Substance Use Topics  . Alcohol use: Yes    Alcohol/week: 6.0 standard drinks    Types: 6 Cans of beer per week    Comment: occasional 6pk per week  . Drug use: No    Current Outpatient Medications  Medication Sig Dispense Refill  . aspirin 81 MG tablet Take 1 tablet (81 mg total) by mouth daily. 30 tablet   . co-enzyme Q-10 30 MG capsule Take 30 mg by mouth 3 (three) times daily.    . Insulin Human (INSULIN PUMP) SOLN Inject into the skin continuous. Uses Novolog in pump Basal rate per patient is as follows: at 12 midnite- .6 , at 0500am-.8 then at 0800am-.6    . lisinopril (PRINIVIL,ZESTRIL) 5 MG tablet Take 1 tablet by mouth daily.     . Multiple Vitamins-Minerals (ICAPS AREDS 2) CAPS Take 1 capsule by mouth daily.    . pantoprazole (PROTONIX) 40 MG tablet Take 40 mg by mouth daily.    . rosuvastatin (CRESTOR) 5 MG tablet Take 1 tablet (5 mg total) by mouth daily. 90 tablet 3  . sertraline (ZOLOFT) 100 MG tablet Take 100 mg by mouth daily.   0  . budesonide-formoterol (SYMBICORT) 160-4.5 MCG/ACT inhaler Inhale 2 puffs into the lungs 2 (two) times daily. (Patient not taking: Reported on 04/17/2019) 3 Inhaler 2   No current facility-administered medications for this visit.     Allergies  Allergen Reactions  . Crestor [Rosuvastatin]     Muscle aches  . Lipitor [Atorvastatin]     Muscle aches  . Other Other (See Comments)    Any antihistamines Causes shaking  . Zocor [Simvastatin] Other (See Comments)    Muscle aches     Review of Systems  Constitutional: Negative for activity change, appetite change, fatigue  and unexpected weight change.  HENT: Negative for trouble swallowing and voice change.   Eyes: Negative for visual disturbance.  Respiratory: Negative for cough, shortness of breath and wheezing.  Cardiovascular: Negative for chest pain and leg swelling.  Gastrointestinal: Negative for abdominal pain and blood in stool.  Genitourinary: Negative for difficulty urinating and dysuria.  Musculoskeletal: Negative for arthralgias.  Neurological: Negative for syncope, speech difficulty and weakness.  Hematological: Negative for adenopathy. Does not bruise/bleed easily.  All other systems reviewed and are negative.   BP (!) 149/78 (BP Location: Right Arm)   Pulse 80   Temp (!) 97.5 F (36.4 C) (Skin)   Resp 16   Ht 5\' 11"  (1.803 m)   Wt 182 lb 4.8 oz (82.7 kg)   SpO2 97% Comment: RA  BMI 25.43 kg/m  Physical Exam Vitals signs reviewed.  Constitutional:      Appearance: Normal appearance.  HENT:     Head: Normocephalic and atraumatic.  Eyes:     General: No scleral icterus.    Extraocular Movements: Extraocular movements intact.  Cardiovascular:     Rate and Rhythm: Normal rate and regular rhythm.     Pulses: Normal pulses.     Heart sounds: Normal heart sounds. No murmur. No friction rub. No gallop.   Pulmonary:     Effort: Pulmonary effort is normal. No respiratory distress.     Breath sounds: Normal breath sounds. No wheezing or rales.  Abdominal:     General: There is no distension.     Tenderness: There is no abdominal tenderness.  Musculoskeletal:        General: No swelling.  Skin:    General: Skin is warm and dry.  Neurological:     General: No focal deficit present.     Mental Status: He is alert and oriented to person, place, and time.     Cranial Nerves: No cranial nerve deficit.     Motor: No weakness.     Coordination: Coordination normal.    Diagnostic Tests: CT CHEST WITHOUT CONTRAST FOR LUNG CANCER SCREENING NODULE FOLLOW-UP  TECHNIQUE: Multidetector  CT imaging of the chest was performed following the standard protocol without IV contrast.  COMPARISON:  09/30/2018, 03/28/2018 and 02/16/2016. PET 02/25/2016. MR abdomen 03/03/2016.  FINDINGS: Cardiovascular: Atherosclerotic calcification of the aorta and coronary arteries. Heart size normal. No pericardial effusion.  Mediastinum/Nodes: 10 mm low-attenuation left thyroid nodule, unchanged. No pathologically enlarged mediastinal or axillary lymph nodes. Hilar regions are difficult to definitively evaluate without IV contrast but appear grossly unremarkable. Esophagus is unremarkable.  Lungs/Pleura: There is a large area of masslike ground-glass straddling the minor fissure with septal thickening and fissural retraction. Volume derived mean diameter is 49.4 mm, compared to 45.1 mm on 09/30/2018 and 39.2 mm on 03/28/2018. When compared with baseline examination of 02/16/2016, it has increased in size from 28.5 mm. Additional solid and non solid pulmonary nodules measure up to 10.9 mm, similar.  Upper Abdomen: Vague low-attenuation lesions in the liver measure 2.6 x 2.7 cm in the right hepatic lobe and were shown to represent hemangiomas on 03/03/2016. Cholecystectomy. Right adrenal gland is unremarkable. 1.5 x 2.3 cm left adrenal nodule was characterized as an adenoma on 03/03/2016. Visualized portions of the kidneys, spleen, pancreas, stomach and bowel are grossly unremarkable. No upper abdominal adenopathy.  Musculoskeletal: Degenerative changes in the spine. No worrisome lytic or sclerotic lesions.  IMPRESSION: 1. Masslike area of ground-glass straddles the minor fissure with septal thickening and fissural retraction. While the lesion has increased in size minimally from 09/30/2018, there is clear enlargement from baseline examination on 02/16/2016. Therefore, lesion is characterized as worrisome for adenocarcinoma, Lung-RADS 4B, suspicious. Additional  imaging  evaluation or consultation with Pulmonology or Thoracic Surgery recommended. These results will be called to the ordering clinician or representative by the Radiologist Assistant, and communication documented in the PACS or zVision Dashboard. 2. Liver hemangiomas and left adrenal adenoma, best characterized on 03/03/2016. 3. Aortic atherosclerosis (ICD10-170.0). Coronary artery calcification.   Electronically Signed   By: Lorin Picket M.D.   On: 04/08/2019 12:15 I personally reviewed the CT images and concur with the findings noted above.  We also reviewed these images during our multidisciplinary thoracic oncology conference and the recommendations below reflects a consensus of that conference.  Impression: Jose Harvey is a 58 year old gentleman with a with a history of type 1 diabetes, tobacco abuse (2 packs/day for 30 years prior to quitting 10 years ago), COPD, vertigo, left adrenal adenoma, prostate cancer, and anxiety.  He was first noted to have a groundglass opacity in the right middle lobe on 3 years ago on a low-dose screening CT for lung cancer.  He has been followed since then.  He had several other smaller nodules that have remained unchanged, but has had an increase in size of the groundglass opacity which now crosses the minor fissure and involves the right upper lobe as well.  There is thickening and enhancement along the fissure.  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  While infectious and inflammatory nodules are in the differential diagnosis, this is a cancer unless it can be proven otherwise.  I had a long discussion with Mr. Uehara and reviewed the images with him.  His girlfriend listened in by telephone.  We discussed the possibility of attempting to biopsy the lesion.  A positive result would result in a recommendation for surgery.  A negative result could not be trusted and would not change that recommendation.   Therefore I recommended we proceed directly to VATS.  I recommended we do a right VATS for probable upper and middle lobectomy.  I discussed the general nature of the procedure including the need for general anesthesia, the incisions to be used, the use of drains to postoperatively, the expected hospital stay, and the overall recovery.  He does understand that other treatments may be necessary depending on intraoperative findings.  I informed him of the indications, risks, benefits, and alternatives.  He understands the risks include, but not limited to death, MI, DVT, PE, bleeding, possible need for transfusion, infection, prolonged air leak, cardiac arrhythmias, as well as the possibility of other unforeseeable complications.  He understands and accepts the risks and wishes to proceed.  He would like to wait until after Thanksgiving and do the procedure on Thursday, 05/08/2019.  Type 1 diabetes-has an insulin pump.  Dyslipidemia-on Crestor  Coronary calcification and aortic atherosclerosis seen on CT.  No symptoms to suggest significant coronary disease.  He is very active without significant exertional dyspnea or chest discomfort.  Plan: Pulmonary function testing scheduled for next week Right VATS for right upper and middle lobectomy on Thursday, 05/08/2019  Melrose Nakayama, MD [facility] [phone]

## 2019-04-17 NOTE — Progress Notes (Signed)
PCP is Leeroy Cha, MD Referring Provider is Magdalen Spatz, NP  Chief Complaint  Patient presents with  . Lung Lesion    Surgical eval, Chest CT 04/08/19, PFT's 04/10/19    HPI: Jose Harvey is sent for consultation regarding a right lung opacity.  Jose Harvey is a 58 year old man with a history of type 1 diabetes, tobacco abuse (2 packs/day for 30 years prior to quitting 10 years ago), COPD, vertigo, left adrenal adenoma, prostate cancer, and anxiety.  He started the lung cancer screening program about 3 years ago.  In 2017 his CT showed multiple small pulmonary nodules.  There was a groundglass attenuation in the right middle lobe with a mean diameter of 20.5 mm.  A PET/CT showed no significant metabolic activity.  He was also noted to have some hemangiomas in the liver and left adrenal adenoma.  He is remained in the screening program since then.  His most recent CT was on 04/08/2019.  The groundglass opacity in the right middle lobe now cross the fissure and involve the inferior aspect of the right upper lobe the mean diameter was up to 49 mm and there also was an area of possible solid component more centrally.  He is now referred for surgical resection  Zubrod Score: At the time of surgery this patient's most appropriate activity status/level should be described as: [x]     0    Normal activity, no symptoms []     1    Restricted in physical strenuous activity but ambulatory, able to do out light work []     2    Ambulatory and capable of self care, unable to do work activities, up and about >50 % of waking hours                              []     3    Only limited self care, in bed greater than 50% of waking hours []     4    Completely disabled, no self care, confined to bed or chair []     5    Moribund  Past Medical History:  Diagnosis Date  . Adrenal adenoma, left 03/2017   noted on CT  . Allergic rhinitis, cause unspecified   . Anxiety state, unspecified   . Aortic  atherosclerosis ([place]) 03/2017   noted on CT  . BPV (benign positional vertigo)   . COPD (chronic obstructive pulmonary disease) with emphysema (HCC)    mild followed by [facility]   . Coronary artery disease   . Depressive disorder, not elsewhere classified   . Dyspnea    history of  . ED (erectile dysfunction)   . Gallbladder polyp 03/2016   noted on CT  . Hepatic hemangioma 03/2017   noted on CT  . History of kidney stones    hx of years ago   . Indigestion   . 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 Mixed hyperlipidemia   . Prostate cancer ([place])   . 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 Type I (juvenile type) diabetes mellitus without mention of complication, not stated as uncontrolled    type I - followed by Dr Buddy Duty   . Vesicoureteral reflux     Past Surgical History:  Procedure Laterality Date  . CHOLECYSTECTOMY N/A 05/17/2016   Procedure: LAPAROSCOPIC CHOLECYSTECTOMY;  Surgeon: Clovis Riley, MD;  Location:  [facility];  Service: General;  Laterality: N/A;  . cyst removed from throat     . dental surgeries     . LYMPHADENECTOMY Bilateral 07/02/2017   Procedure: BILATERAL LYMPHADENECTOMY;  Surgeon: Lucas Mallow, MD;  Location: [facility];  Service: Urology;  Laterality: Bilateral;  . PROSTATE BIOPSY    . ROBOT ASSISTED LAPAROSCOPIC RADICAL PROSTATECTOMY N/A 07/02/2017   Procedure: XI ROBOTIC ASSISTED LAPAROSCOPIC RADICAL PROSTATECTOMY;  Surgeon: Lucas Mallow, MD;  Location: [facility];  Service: Urology;  Laterality: N/A;  . TONSILLECTOMY    . WISDOM TOOTH EXTRACTION      Family History  Problem Relation Age of Onset  . Diabetes type I Father   . Heart attack Father   . Hypertension Father   . Emphysema Father   . CAD Father   . Colon cancer Maternal Grandfather   . Emphysema Paternal Grandfather   . Diabetes type I Paternal Grandfather   . Kidney disease Neg Hx   .  Liver disease Neg Hx   . Breast cancer Neg Hx   . Prostate cancer Neg Hx     Social History Social History   Tobacco Use  . Smoking status: Former Smoker    Packs/day: 1.50    Years: 27.00    Pack years: 40.50    Types: Cigarettes    Quit date: 06/05/2008    Years since quitting: 10.8  . Smokeless tobacco: Former Systems developer    Types: Snuff    Quit date: 2015  Substance Use Topics  . Alcohol use: Yes    Alcohol/week: 6.0 standard drinks    Types: 6 Cans of beer per week    Comment: occasional 6pk per week  . Drug use: No    Current Outpatient Medications  Medication Sig Dispense Refill  . aspirin 81 MG tablet Take 1 tablet (81 mg total) by mouth daily. 30 tablet   . co-enzyme Q-10 30 MG capsule Take 30 mg by mouth 3 (three) times daily.    . Insulin Human (INSULIN PUMP) SOLN Inject into the skin continuous. Uses Novolog in pump Basal rate per patient is as follows: at 12 midnite- .6 , at 0500am-.8 then at 0800am-.6    . lisinopril (PRINIVIL,ZESTRIL) 5 MG tablet Take 1 tablet by mouth daily.     . Multiple Vitamins-Minerals (ICAPS AREDS 2) CAPS Take 1 capsule by mouth daily.    . pantoprazole (PROTONIX) 40 MG tablet Take 40 mg by mouth daily.    . rosuvastatin (CRESTOR) 5 MG tablet Take 1 tablet (5 mg total) by mouth daily. 90 tablet 3  . sertraline (ZOLOFT) 100 MG tablet Take 100 mg by mouth daily.   0  . budesonide-formoterol (SYMBICORT) 160-4.5 MCG/ACT inhaler Inhale 2 puffs into the lungs 2 (two) times daily. (Patient not taking: Reported on 04/17/2019) 3 Inhaler 2   No current facility-administered medications for this visit.     Allergies  Allergen Reactions  . Crestor [Rosuvastatin]     Muscle aches  . Lipitor [Atorvastatin]     Muscle aches  . Other Other (See Comments)    Any antihistamines Causes shaking  . Zocor [Simvastatin] Other (See Comments)    Muscle aches     Review of Systems  Constitutional: Negative for activity change, appetite change, fatigue  and unexpected weight change.  HENT: Negative for trouble swallowing and voice change.   Eyes: Negative for visual disturbance.  Respiratory: Negative for cough, shortness of breath and wheezing.  Cardiovascular: Negative for chest pain and leg swelling.  Gastrointestinal: Negative for abdominal pain and blood in stool.  Genitourinary: Negative for difficulty urinating and dysuria.  Musculoskeletal: Negative for arthralgias.  Neurological: Negative for syncope, speech difficulty and weakness.  Hematological: Negative for adenopathy. Does not bruise/bleed easily.  All other systems reviewed and are negative.   BP (!) 149/78 (BP Location: Right Arm)   Pulse 80   Temp (!) 97.5 F (36.4 C) (Skin)   Resp 16   Ht 5\' 11"  (1.803 m)   Wt 182 lb 4.8 oz (82.7 kg)   SpO2 97% Comment: RA  BMI 25.43 kg/m  Physical Exam Vitals signs reviewed.  Constitutional:      Appearance: Normal appearance.  HENT:     Head: Normocephalic and atraumatic.  Eyes:     General: No scleral icterus.    Extraocular Movements: Extraocular movements intact.  Cardiovascular:     Rate and Rhythm: Normal rate and regular rhythm.     Pulses: Normal pulses.     Heart sounds: Normal heart sounds. No murmur. No friction rub. No gallop.   Pulmonary:     Effort: Pulmonary effort is normal. No respiratory distress.     Breath sounds: Normal breath sounds. No wheezing or rales.  Abdominal:     General: There is no distension.     Tenderness: There is no abdominal tenderness.  Musculoskeletal:        General: No swelling.  Skin:    General: Skin is warm and dry.  Neurological:     General: No focal deficit present.     Mental Status: He is alert and oriented to person, place, and time.     Cranial Nerves: No cranial nerve deficit.     Motor: No weakness.     Coordination: Coordination normal.    Diagnostic Tests: CT CHEST WITHOUT CONTRAST FOR LUNG CANCER SCREENING NODULE FOLLOW-UP  TECHNIQUE: Multidetector  CT imaging of the chest was performed following the standard protocol without IV contrast.  COMPARISON:  09/30/2018, 03/28/2018 and 02/16/2016. PET 02/25/2016. MR abdomen 03/03/2016.  FINDINGS: Cardiovascular: Atherosclerotic calcification of the aorta and coronary arteries. Heart size normal. No pericardial effusion.  Mediastinum/Nodes: 10 mm low-attenuation left thyroid nodule, unchanged. No pathologically enlarged mediastinal or axillary lymph nodes. Hilar regions are difficult to definitively evaluate without IV contrast but appear grossly unremarkable. Esophagus is unremarkable.  Lungs/Pleura: There is a large area of masslike ground-glass straddling the minor fissure with septal thickening and fissural retraction. Volume derived mean diameter is 49.4 mm, compared to 45.1 mm on 09/30/2018 and 39.2 mm on 03/28/2018. When compared with baseline examination of 02/16/2016, it has increased in size from 28.5 mm. Additional solid and non solid pulmonary nodules measure up to 10.9 mm, similar.  Upper Abdomen: Vague low-attenuation lesions in the liver measure 2.6 x 2.7 cm in the right hepatic lobe and were shown to represent hemangiomas on 03/03/2016. Cholecystectomy. Right adrenal gland is unremarkable. 1.5 x 2.3 cm left adrenal nodule was characterized as an adenoma on 03/03/2016. Visualized portions of the kidneys, spleen, pancreas, stomach and bowel are grossly unremarkable. No upper abdominal adenopathy.  Musculoskeletal: Degenerative changes in the spine. No worrisome lytic or sclerotic lesions.  IMPRESSION: 1. Masslike area of ground-glass straddles the minor fissure with septal thickening and fissural retraction. While the lesion has increased in size minimally from 09/30/2018, there is clear enlargement from baseline examination on 02/16/2016. Therefore, lesion is characterized as worrisome for adenocarcinoma, Lung-RADS 4B, suspicious. Additional  imaging  evaluation or consultation with Pulmonology or Thoracic Surgery recommended. These results will be called to the ordering clinician or representative by the Radiologist Assistant, and communication documented in the PACS or zVision Dashboard. 2. Liver hemangiomas and left adrenal adenoma, best characterized on 03/03/2016. 3. Aortic atherosclerosis (ICD10-170.0). Coronary artery calcification.   Electronically Signed   By: Lorin Picket M.D.   On: 04/08/2019 12:15 I personally reviewed the CT images and concur with the findings noted above.  We also reviewed these images during our multidisciplinary thoracic oncology conference and the recommendations below reflects a consensus of that conference.  Impression: Jose Harvey is a 58 year old gentleman with a with a history of type 1 diabetes, tobacco abuse (2 packs/day for 30 years prior to quitting 10 years ago), COPD, vertigo, left adrenal adenoma, prostate cancer, and anxiety.  He was first noted to have a groundglass opacity in the right middle lobe on 3 years ago on a low-dose screening CT for lung cancer.  He has been followed since then.  He had several other smaller nodules that have remained unchanged, but has had an increase in size of the groundglass opacity which now crosses the minor fissure and involves the right upper lobe as well.  There is thickening and enhancement along the fissure.  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  While infectious and inflammatory nodules are in the differential diagnosis, this is a cancer unless it can be proven otherwise.  I had a long discussion with Mr. Brester and reviewed the images with him.  His girlfriend listened in by telephone.  We discussed the possibility of attempting to biopsy the lesion.  A positive result would result in a recommendation for surgery.  A negative result could not be trusted and would not change that recommendation.   Therefore I recommended we proceed directly to VATS.  I recommended we do a right VATS for probable upper and middle lobectomy.  I discussed the general nature of the procedure including the need for general anesthesia, the incisions to be used, the use of drains to postoperatively, the expected hospital stay, and the overall recovery.  He does understand that other treatments may be necessary depending on intraoperative findings.  I informed him of the indications, risks, benefits, and alternatives.  He understands the risks include, but not limited to death, MI, DVT, PE, bleeding, possible need for transfusion, infection, prolonged air leak, cardiac arrhythmias, as well as the possibility of other unforeseeable complications.  He understands and accepts the risks and wishes to proceed.  He would like to wait until after Thanksgiving and do the procedure on Thursday, 05/08/2019.  Type 1 diabetes-has an insulin pump.  Dyslipidemia-on Crestor  Coronary calcification and aortic atherosclerosis seen on CT.  No symptoms to suggest significant coronary disease.  He is very active without significant exertional dyspnea or chest discomfort.  Plan: Pulmonary function testing scheduled for next week Right VATS for right upper and middle lobectomy on Thursday, 05/08/2019  Melrose Nakayama, MD [facility] [phone]

## 2019-04-17 NOTE — Progress Notes (Signed)
Oncology Nurse Navigator Documentation  Oncology Nurse Navigator Flowsheets 04/17/2019  Abnormal Finding Date -  Confirmed Diagnosis Date -  Expected Surgery Date -  Navigator Location [facility]  Navigator Encounter Type -  Telephone -  Treatment Phase Abnormal Scans  Barriers/Navigation Needs Coordination of Care/per cancer conference discussion, patient needs to be seen with thoracic surgery.  I followed up on his appt and he will be seeing them today.    Interventions Coordination of Care  Acuity Level 2-Minimal Needs (1-2 Barriers Identified)  Coordination of Care Other  Time Spent with Patient 15

## 2019-04-18 ENCOUNTER — Encounter: Payer: Self-pay | Admitting: *Deleted

## 2019-04-18 ENCOUNTER — Other Ambulatory Visit: Payer: Self-pay | Admitting: *Deleted

## 2019-04-18 ENCOUNTER — Other Ambulatory Visit (HOSPITAL_COMMUNITY)
Admission: RE | Admit: 2019-04-18 | Discharge: 2019-04-18 | Disposition: A | Discharge status: Home / Self Care | Payer: 59 | Source: Ambulatory Visit | Attending: Acute Care | Admitting: Acute Care

## 2019-04-18 DIAGNOSIS — Z20828 Contact with and (suspected) exposure to other viral communicable diseases: Secondary | ICD-10-CM | POA: Insufficient documentation

## 2019-04-18 DIAGNOSIS — Z01812 Encounter for preprocedural laboratory examination: Secondary | ICD-10-CM | POA: Diagnosis present

## 2019-04-18 DIAGNOSIS — R918 Other nonspecific abnormal finding of lung field: Secondary | ICD-10-CM

## 2019-04-18 LAB — SARS CORONAVIRUS 2 (TAT 6-24 HRS): SARS Coronavirus 2: NEGATIVE

## 2019-04-21 ENCOUNTER — Other Ambulatory Visit: Payer: Self-pay

## 2019-04-21 ENCOUNTER — Ambulatory Visit (HOSPITAL_COMMUNITY)
Admission: RE | Admit: 2019-04-21 | Discharge: 2019-04-21 | Disposition: A | Discharge status: Home / Self Care | Payer: 59 | Source: Ambulatory Visit | Attending: Acute Care | Admitting: Acute Care

## 2019-04-21 DIAGNOSIS — R911 Solitary pulmonary nodule: Secondary | ICD-10-CM | POA: Diagnosis present

## 2019-04-21 LAB — PULMONARY FUNCTION TEST
DL/VA % pred: 103 %
DL/VA: 4.36 ml/min/mmHg/L
DLCO unc % pred: 82 %
DLCO unc: 24.31 ml/min/mmHg
FEF 25-75 Post: 2.63 L/sec
FEF 25-75 Pre: 2.15 L/sec
FEF2575-%Change-Post: 22 %
FEF2575-%Pred-Post: 81 %
FEF2575-%Pred-Pre: 66 %
FEV1-%Change-Post: 3 %
FEV1-%Pred-Post: 76 %
FEV1-%Pred-Pre: 73 %
FEV1-Post: 2.97 L
FEV1-Pre: 2.87 L
FEV1FVC-%Change-Post: 6 %
FEV1FVC-%Pred-Pre: 97 %
FEV6-%Change-Post: 0 %
FEV6-%Pred-Post: 75 %
FEV6-%Pred-Pre: 75 %
FEV6-Post: 3.71 L
FEV6-Pre: 3.69 L
FEV6FVC-%Change-Post: 4 %
FEV6FVC-%Pred-Post: 104 %
FEV6FVC-%Pred-Pre: 100 %
FVC-%Change-Post: -2 %
FVC-%Pred-Post: 73 %
FVC-%Pred-Pre: 75 %
FVC-Post: 3.76 L
FVC-Pre: 3.86 L
Post FEV1/FVC ratio: 79 %
Post FEV6/FVC ratio: 100 %
Pre FEV1/FVC ratio: 74 %
Pre FEV6/FVC Ratio: 96 %
RV % pred: 142 %
RV: 3.32 L
TLC % pred: 96 %
TLC: 7.16 L

## 2019-04-21 MED ORDER — ALBUTEROL SULFATE (2.5 MG/3ML) 0.083% IN NEBU
2.5000 mg | INHALATION_SOLUTION | Freq: Once | RESPIRATORY_TRACT | Status: AC
  Administered 2019-04-21: 15:00:00 2.5 mg via RESPIRATORY_TRACT

## 2019-05-06 ENCOUNTER — Ambulatory Visit (HOSPITAL_COMMUNITY)
Admission: RE | Admit: 2019-05-06 | Discharge: 2019-05-06 | Disposition: A | Discharge status: Home / Self Care | Payer: 59 | Source: Ambulatory Visit | Attending: Thoracic Surgery (Cardiothoracic Vascular Surgery) | Admitting: Thoracic Surgery (Cardiothoracic Vascular Surgery)

## 2019-05-06 ENCOUNTER — Other Ambulatory Visit: Payer: Self-pay

## 2019-05-06 ENCOUNTER — Other Ambulatory Visit (HOSPITAL_COMMUNITY): Payer: 59

## 2019-05-06 ENCOUNTER — Encounter (HOSPITAL_COMMUNITY)
Admission: RE | Admit: 2019-05-06 | Discharge: 2019-05-06 | Disposition: A | Discharge status: Home / Self Care | Payer: 59 | Source: Ambulatory Visit | Attending: Thoracic Surgery (Cardiothoracic Vascular Surgery) | Admitting: Thoracic Surgery (Cardiothoracic Vascular Surgery)

## 2019-05-06 ENCOUNTER — Encounter (HOSPITAL_COMMUNITY): Payer: Self-pay

## 2019-05-06 ENCOUNTER — Other Ambulatory Visit (HOSPITAL_COMMUNITY)
Admission: RE | Admit: 2019-05-06 | Discharge: 2019-05-06 | Disposition: A | Discharge status: Home / Self Care | Payer: 59 | Source: Ambulatory Visit | Attending: Thoracic Surgery (Cardiothoracic Vascular Surgery) | Admitting: Thoracic Surgery (Cardiothoracic Vascular Surgery)

## 2019-05-06 DIAGNOSIS — Z01818 Encounter for other preprocedural examination: Secondary | ICD-10-CM | POA: Insufficient documentation

## 2019-05-06 DIAGNOSIS — R918 Other nonspecific abnormal finding of lung field: Secondary | ICD-10-CM

## 2019-05-06 DIAGNOSIS — Z20828 Contact with and (suspected) exposure to other viral communicable diseases: Secondary | ICD-10-CM | POA: Insufficient documentation

## 2019-05-06 DIAGNOSIS — Z01812 Encounter for preprocedural laboratory examination: Secondary | ICD-10-CM | POA: Insufficient documentation

## 2019-05-06 LAB — COMPREHENSIVE METABOLIC PANEL
ALT: 30 U/L (ref 0–44)
AST: 27 U/L (ref 15–41)
Albumin: 3.6 g/dL (ref 3.5–5.0)
Alkaline Phosphatase: 63 U/L (ref 38–126)
Anion gap: 10 (ref 5–15)
BUN: 21 mg/dL — ABNORMAL HIGH (ref 6–20)
CO2: 22 mmol/L (ref 22–32)
Calcium: 9.1 mg/dL (ref 8.9–10.3)
Chloride: 107 mmol/L (ref 98–111)
Creatinine, Ser: 0.8 mg/dL (ref 0.61–1.24)
GFR calc Af Amer: >60 mL/min (ref >60)
GFR calc non Af Amer: >60 mL/min (ref >60)
Glucose, Bld: 103 mg/dL — ABNORMAL HIGH (ref 70–99)
Potassium: 4.5 mmol/L (ref 3.5–5.1)
Sodium: 139 mmol/L (ref 135–145)
Total Bilirubin: 0.4 mg/dL (ref 0.3–1.2)
Total Protein: 6.6 g/dL (ref 6.5–8.1)

## 2019-05-06 LAB — CBC
HCT: 43.5 % (ref 39.0–52.0)
Hemoglobin: 14.5 g/dL (ref 13.0–17.0)
MCH: 31.1 pg (ref 26.0–34.0)
MCHC: 33.3 g/dL (ref 30.0–36.0)
MCV: 93.3 fL (ref 80.0–100.0)
Platelets: 217 10*3/uL (ref 150–400)
RBC: 4.66 MIL/uL (ref 4.22–5.81)
RDW: 13 % (ref 11.5–15.5)
WBC: 7.7 10*3/uL (ref 4.0–10.5)
nRBC: 0 % (ref 0.0–0.2)

## 2019-05-06 LAB — URINALYSIS, ROUTINE W REFLEX MICROSCOPIC
Bilirubin Urine: NEGATIVE
Glucose, UA: NEGATIVE mg/dL
Hgb urine dipstick: NEGATIVE
Ketones, ur: NEGATIVE mg/dL
Leukocytes,Ua: NEGATIVE
Nitrite: NEGATIVE
Protein, ur: 30 mg/dL — AB
Specific Gravity, Urine: 1.02 (ref 1.005–1.030)
pH: 7 (ref 5.0–8.0)

## 2019-05-06 LAB — BLOOD GAS, ARTERIAL
Acid-Base Excess: 1.4 mmol/L (ref 0.0–2.0)
Bicarbonate: 25.1 mmol/L (ref 20.0–28.0)
Drawn by: 421801
FIO2: 21
O2 Saturation: 94.5 %
Patient temperature: 37
pCO2 arterial: 37.1 mmHg (ref 32.0–48.0)
pH, Arterial: 7.445 (ref 7.350–7.450)
pO2, Arterial: 81.2 mmHg — ABNORMAL LOW (ref 83.0–108.0)

## 2019-05-06 LAB — PROTIME-INR
INR: 0.9 (ref 0.8–1.2)
Prothrombin Time: 12.5 s (ref 11.4–15.2)

## 2019-05-06 LAB — APTT: aPTT: 24 seconds (ref 24–36)

## 2019-05-06 LAB — GLUCOSE, CAPILLARY: Glucose-Capillary: 151 mg/dL — ABNORMAL HIGH (ref 70–99)

## 2019-05-06 LAB — ABO/RH: ABO/RH(D): A NEG

## 2019-05-06 LAB — SURGICAL PCR SCREEN
MRSA, PCR: NEGATIVE
Staphylococcus aureus: NEGATIVE

## 2019-05-06 LAB — SARS CORONAVIRUS 2 (TAT 6-24 HRS): SARS Coronavirus 2: NEGATIVE

## 2019-05-06 NOTE — Progress Notes (Signed)
[employer] Dr. Roxan Hockey about patient's UA results.

## 2019-05-06 NOTE — Pre-Procedure Instructions (Signed)
[id number] - [place], [address] [address] Phone: [phone] Fax: [phone]  [facility] [place], [place] [id number] Phone: [phone] Fax: [phone]      Your procedure is scheduled on Thursday, December 3rd, from 08:00 AM to 11:00 AM.  Report to [facility] Main Entrance "A" at 06:00 A.M., and check in at the Admitting office.  Call this number if you have problems the morning of surgery:  [phone]    Remember:  Do not eat or drink after midnight the night before your surgery.     Take these medicines the morning of surgery with A SIP OF WATER:  cetirizine (KLS ALLER-TEC) pantoprazole (PROTONIX) rosuvastatin (CRESTOR)  sertraline (ZOLOFT)  Follow your surgeon's instructions on when to stop Aspirin.  If no instructions were given by your surgeon then you will need to call the office to get those instructions.    As of today, STOP taking any Aspirin (unless otherwise instructed by your surgeon), Aleve, Naproxen, Ibuprofen, Motrin, Advil, Goody's, BC's, all herbal medications, fish oil, and all vitamins.  Reduce your insulin pump rate by 20% at midnight the night before your surgery.    The Morning of Surgery  Do not wear jewelry.  Do not wear lotions, powders, colognes, or deodorant.  Men may shave face and neck.  Do not bring valuables to the hospital.  [facility] is not responsible for any belongings or valuables.  If you are a smoker, DO NOT Smoke 24 hours prior to surgery  If you wear a CPAP at night please bring your mask, tubing, and machine the morning of surgery   Remember that you must have someone to transport you home after your surgery, and remain with you for 24 hours if you are discharged the same day.   Please bring cases for contacts, glasses, hearing aids, dentures or bridgework because it cannot be worn  into surgery.    Leave your suitcase in the car.  After surgery it may be brought to your room.  For patients admitted to the hospital, discharge time will be determined by your treatment team.  Patients discharged the day of surgery will not be allowed to drive home.    Special instructions:   [facility]- Preparing For Surgery  Before surgery, you can play an important role. Because skin is not sterile, your skin needs to be as free of germs as possible. You can reduce the number of germs on your skin by washing with CHG (chlorahexidine gluconate) Soap before surgery.  CHG is an antiseptic cleaner which kills germs and bonds with the skin to continue killing germs even after washing.    Please do not use if you have an allergy to CHG or antibacterial soaps. If your skin becomes reddened/irritated stop using the CHG.  Do not shave (including legs and underarms) for at least 48 hours prior to first CHG shower. It is OK to shave your face.  Please follow these instructions carefully.   1. Shower the NIGHT BEFORE SURGERY and the MORNING OF SURGERY with CHG Soap.   2. If you chose to wash your hair, wash your hair first as usual with your normal shampoo.  3. After you shampoo, rinse your hair and body thoroughly to remove the shampoo.  4. Use CHG as you would any other liquid soap. You can apply CHG directly to the skin and  wash gently with a scrungie or a clean washcloth.   5. Apply the CHG Soap to your body ONLY FROM THE NECK DOWN.  Do not use on open wounds or open sores. Avoid contact with your eyes, ears, mouth and genitals (private parts). Wash Face and genitals (private parts)  with your normal soap.   6. Wash thoroughly, paying special attention to the area where your surgery will be performed.  7. Thoroughly rinse your body with warm water from the neck down.  8. DO NOT shower/wash with your normal soap after using and rinsing off the CHG Soap.  9. Pat yourself dry with a  CLEAN TOWEL.  10. Wear CLEAN PAJAMAS to bed the night before surgery, wear comfortable clothes the morning of surgery  11. Place CLEAN SHEETS on your bed the night of your first shower and DO NOT SLEEP WITH PETS.    Day of Surgery:   Remember to brush your teeth WITH YOUR REGULAR TOOTHPASTE. Please shower the morning of surgery with the CHG soap Do not apply any deodorants/lotions. Please wear clean clothes to the hospital/surgery center.      Please read over the following fact sheets that you were given.

## 2019-05-06 NOTE — Progress Notes (Signed)
PCP - Leeroy Cha, MD Cardiologist - Ena Dawley, MD Endocrinology- Delrae Rend, MD Pulmonology- Reine Just, MD Urologist- Link Snuffer III, MD  PPM/ICD - Denies  Chest x-ray - 05/06/2019 EKG - 05/06/2019 Stress Test - 12/26/2013 ECHO - 12/06/2013 Cardiac Cath - Denies  Sleep Study - Denies  Fasting Blood Sugar - 150s Checks Blood Sugar 6-7 times per day with implant. Also has Insulin Pump.  Blood Thinner Instructions: N/A Aspirin Instructions: Patient will contact Dr. Leonarda Salon OFFICE FOR INSTRUCTIONS  ERAS Protcol - None PRE-SURGERY Ensure or G2- N/A  COVID TEST- 05/06/2019   Anesthesia review: No  Patient denies shortness of breath, fever, cough and chest pain at PAT appointment  Coronavirus Screening  Have you experienced the following symptoms:  Cough yes/no: No Fever (>100.66F)  yes/no: No Runny nose yes/no: No Sore throat yes/no: No Difficulty breathing/shortness of breath  yes/no: No Have you or a family member traveled in the last 14 days and where? yes/no: No   If the patient indicates "YES" to the above questions, their PAT will be rescheduled to limit the exposure to others and, the surgeon will be notified. THE PATIENT WILL NEED TO BE ASYMPTOMATIC FOR 14 DAYS.   If the patient is not experiencing any of these symptoms, the PAT nurse will instruct them to NOT bring anyone with them to their appointment since they may have these symptoms or traveled as well.   Please remind your patients and families that hospital visitation restrictions are in effect and the importance of the restrictions.    All instructions explained to the patient, with a verbal understanding of the material. Patient agrees to go over the instructions while at home for a better understanding. Patient also instructed to self quarantine after being tested for COVID-19. The opportunity to ask questions was provided.

## 2019-05-07 NOTE — Anesthesia Preprocedure Evaluation (Addendum)
Anesthesia Evaluation  Patient identified by MRN, date of birth, ID band Patient awake    Reviewed: Allergy & Precautions, NPO status , Patient's Chart, lab work & pertinent test results  Airway Mallampati: III  TM Distance: >3 FB Neck ROM: Full    Dental  (+) Chipped, Implants,    Pulmonary COPD, former smoker,    Pulmonary exam normal breath sounds clear to auscultation       Cardiovascular hypertension, Pt. on medications + CAD (per CT)  Normal cardiovascular exam Rhythm:Regular Rate:Normal     Neuro/Psych PSYCHIATRIC DISORDERS Anxiety Depression negative neurological ROS     GI/Hepatic negative GI ROS, Neg liver ROS,   Endo/Other  diabetes, Insulin Dependent  Renal/GU negative Renal ROS     Musculoskeletal negative musculoskeletal ROS (+)   Abdominal   Peds  Hematology HLD   Anesthesia Other Findings RIGHT UPPER AND MIDDLE NODULES  Reproductive/Obstetrics                           Anesthesia Physical Anesthesia Plan  ASA: III  Anesthesia Plan: General   Post-op Pain Management:    Induction: Intravenous  PONV Risk Score and Plan: 2 and Ondansetron, Dexamethasone, Midazolam and Treatment may vary due to age or medical condition  Airway Management Planned: Double Lumen EBT  Additional Equipment: Arterial line, CVP and Ultrasound Guidance Line Placement  Intra-op Plan:   Post-operative Plan: Extubation in OR  Informed Consent: I have reviewed the patients History and Physical, chart, labs and discussed the procedure including the risks, benefits and alternatives for the proposed anesthesia with the patient or authorized representative who has indicated his/her understanding and acceptance.     Dental advisory given  Plan Discussed with: CRNA  Anesthesia Plan Comments:        Anesthesia Quick Evaluation

## 2019-05-08 ENCOUNTER — Inpatient Hospital Stay (HOSPITAL_COMMUNITY): Payer: 59

## 2019-05-08 ENCOUNTER — Other Ambulatory Visit: Payer: Self-pay

## 2019-05-08 ENCOUNTER — Encounter (HOSPITAL_COMMUNITY): Payer: Self-pay

## 2019-05-08 ENCOUNTER — Encounter (HOSPITAL_COMMUNITY)
Admission: RE | Disposition: A | Discharge status: Home / Self Care | Payer: Self-pay | Source: Home / Self Care | Attending: Thoracic Surgery (Cardiothoracic Vascular Surgery)

## 2019-05-08 ENCOUNTER — Inpatient Hospital Stay (HOSPITAL_COMMUNITY)
Admission: RE | Admit: 2019-05-08 | Discharge: 2019-05-13 | DRG: 821 | Disposition: A | Discharge status: Home / Self Care | Payer: 59 | Attending: Thoracic Surgery (Cardiothoracic Vascular Surgery) | Admitting: Thoracic Surgery (Cardiothoracic Vascular Surgery)

## 2019-05-08 DIAGNOSIS — F329 Major depressive disorder, single episode, unspecified: Secondary | ICD-10-CM | POA: Diagnosis present

## 2019-05-08 DIAGNOSIS — Z825 Family history of asthma and other chronic lower respiratory diseases: Secondary | ICD-10-CM | POA: Diagnosis not present

## 2019-05-08 DIAGNOSIS — Z9889 Other specified postprocedural states: Secondary | ICD-10-CM

## 2019-05-08 DIAGNOSIS — D3502 Benign neoplasm of left adrenal gland: Secondary | ICD-10-CM | POA: Diagnosis present

## 2019-05-08 DIAGNOSIS — J449 Chronic obstructive pulmonary disease, unspecified: Secondary | ICD-10-CM | POA: Diagnosis present

## 2019-05-08 DIAGNOSIS — Z87891 Personal history of nicotine dependence: Secondary | ICD-10-CM | POA: Diagnosis not present

## 2019-05-08 DIAGNOSIS — Z800 Family history of malignant neoplasm of digestive organs: Secondary | ICD-10-CM

## 2019-05-08 DIAGNOSIS — D62 Acute posthemorrhagic anemia: Secondary | ICD-10-CM | POA: Diagnosis not present

## 2019-05-08 DIAGNOSIS — Z20828 Contact with and (suspected) exposure to other viral communicable diseases: Secondary | ICD-10-CM | POA: Diagnosis present

## 2019-05-08 DIAGNOSIS — Z833 Family history of diabetes mellitus: Secondary | ICD-10-CM | POA: Diagnosis not present

## 2019-05-08 DIAGNOSIS — Z7982 Long term (current) use of aspirin: Secondary | ICD-10-CM

## 2019-05-08 DIAGNOSIS — Z4682 Encounter for fitting and adjustment of non-vascular catheter: Secondary | ICD-10-CM

## 2019-05-08 DIAGNOSIS — R911 Solitary pulmonary nodule: Secondary | ICD-10-CM | POA: Diagnosis not present

## 2019-05-08 DIAGNOSIS — Z8546 Personal history of malignant neoplasm of prostate: Secondary | ICD-10-CM

## 2019-05-08 DIAGNOSIS — Z7951 Long term (current) use of inhaled steroids: Secondary | ICD-10-CM | POA: Diagnosis not present

## 2019-05-08 DIAGNOSIS — F419 Anxiety disorder, unspecified: Secondary | ICD-10-CM | POA: Diagnosis present

## 2019-05-08 DIAGNOSIS — D72829 Elevated white blood cell count, unspecified: Secondary | ICD-10-CM | POA: Diagnosis not present

## 2019-05-08 DIAGNOSIS — Z09 Encounter for follow-up examination after completed treatment for conditions other than malignant neoplasm: Secondary | ICD-10-CM

## 2019-05-08 DIAGNOSIS — Z9641 Presence of insulin pump (external) (internal): Secondary | ICD-10-CM | POA: Diagnosis present

## 2019-05-08 DIAGNOSIS — J9382 Other air leak: Secondary | ICD-10-CM | POA: Diagnosis not present

## 2019-05-08 DIAGNOSIS — J939 Pneumothorax, unspecified: Secondary | ICD-10-CM

## 2019-05-08 DIAGNOSIS — C884 Extranodal marginal zone B-cell lymphoma of mucosa-associated lymphoid tissue [MALT-lymphoma]: Secondary | ICD-10-CM | POA: Diagnosis present

## 2019-05-08 DIAGNOSIS — E109 Type 1 diabetes mellitus without complications: Secondary | ICD-10-CM | POA: Diagnosis present

## 2019-05-08 DIAGNOSIS — Z8249 Family history of ischemic heart disease and other diseases of the circulatory system: Secondary | ICD-10-CM | POA: Diagnosis not present

## 2019-05-08 DIAGNOSIS — I10 Essential (primary) hypertension: Secondary | ICD-10-CM | POA: Diagnosis present

## 2019-05-08 DIAGNOSIS — Z794 Long term (current) use of insulin: Secondary | ICD-10-CM | POA: Diagnosis not present

## 2019-05-08 DIAGNOSIS — I4891 Unspecified atrial fibrillation: Secondary | ICD-10-CM | POA: Diagnosis not present

## 2019-05-08 DIAGNOSIS — R918 Other nonspecific abnormal finding of lung field: Secondary | ICD-10-CM

## 2019-05-08 HISTORY — PX: VIDEO ASSISTED THORACOSCOPY (VATS)/ LOBECTOMY: SHX6169

## 2019-05-08 LAB — PREPARE RBC (CROSSMATCH)

## 2019-05-08 LAB — GLUCOSE, CAPILLARY
Glucose-Capillary: 194 mg/dL — ABNORMAL HIGH (ref 70–99)
Glucose-Capillary: 230 mg/dL — ABNORMAL HIGH (ref 70–99)
Glucose-Capillary: 269 mg/dL — ABNORMAL HIGH (ref 70–99)
Glucose-Capillary: 298 mg/dL — ABNORMAL HIGH (ref 70–99)
Glucose-Capillary: 279 mg/dL — ABNORMAL HIGH (ref 70–99)
Glucose-Capillary: 298 mg/dL — ABNORMAL HIGH (ref 70–99)
Glucose-Capillary: 298 mg/dL — ABNORMAL HIGH (ref 70–99)

## 2019-05-08 SURGERY — VIDEO ASSISTED THORACOSCOPY (VATS)/ LOBECTOMY
Anesthesia: General | Site: Chest | Laterality: Right

## 2019-05-08 MED ORDER — LIDOCAINE 2% (20 MG/ML) 5 ML SYRINGE
INTRAMUSCULAR | Status: AC
  Filled 2019-05-08: qty 10

## 2019-05-08 MED ORDER — ACETAMINOPHEN 500 MG PO TABS
1000.0000 mg | ORAL_TABLET | Freq: Once | ORAL | Status: AC
  Administered 2019-05-08: 1000 mg via ORAL
  Filled 2019-05-08: qty 2

## 2019-05-08 MED ORDER — LIDOCAINE 2% (20 MG/ML) 5 ML SYRINGE
INTRAMUSCULAR | Status: AC
  Filled 2019-05-08: qty 5

## 2019-05-08 MED ORDER — EPHEDRINE SULFATE-NACL 50-0.9 MG/10ML-% IV SOSY
PREFILLED_SYRINGE | INTRAVENOUS | Status: DC | PRN
  Administered 2019-05-08: 5 mg via INTRAVENOUS
  Administered 2019-05-08 (×2): 10 mg via INTRAVENOUS
  Administered 2019-05-08: 5 mg via INTRAVENOUS
  Administered 2019-05-08: 10 mg via INTRAVENOUS

## 2019-05-08 MED ORDER — LIDOCAINE 2% (20 MG/ML) 5 ML SYRINGE
INTRAMUSCULAR | Status: DC | PRN
  Administered 2019-05-08: 100 mg via INTRAVENOUS

## 2019-05-08 MED ORDER — SUGAMMADEX SODIUM 200 MG/2ML IV SOLN
INTRAVENOUS | Status: DC | PRN
  Administered 2019-05-08: 200 mg via INTRAVENOUS

## 2019-05-08 MED ORDER — SODIUM CHLORIDE (PF) 0.9 % IJ SOLN
INTRAMUSCULAR | Status: AC
  Filled 2019-05-08: qty 10

## 2019-05-08 MED ORDER — SODIUM CHLORIDE 0.9 % IV SOLN
INTRAVENOUS | Status: DC
  Administered 2019-05-08: 19:00:00 via INTRAVENOUS

## 2019-05-08 MED ORDER — DIPHENHYDRAMINE HCL 50 MG/ML IJ SOLN: 12.5000 mg | Freq: Four times a day (QID) | INTRAMUSCULAR | Status: DC | PRN

## 2019-05-08 MED ORDER — VECURONIUM BROMIDE 10 MG IV SOLR
INTRAVENOUS | Status: DC | PRN
  Administered 2019-05-08 (×4): 2 mg via INTRAVENOUS

## 2019-05-08 MED ORDER — MIDAZOLAM HCL 5 MG/5ML IJ SOLN
INTRAMUSCULAR | Status: DC | PRN
  Administered 2019-05-08: 1 mg via INTRAVENOUS
  Administered 2019-05-08: 2 mg via INTRAVENOUS

## 2019-05-08 MED ORDER — PROPOFOL 10 MG/ML IV BOLUS
INTRAVENOUS | Status: AC
  Filled 2019-05-08: qty 40

## 2019-05-08 MED ORDER — CEFAZOLIN SODIUM-DEXTROSE 2-4 GM/100ML-% IV SOLN
2.0000 g | Freq: Three times a day (TID) | INTRAVENOUS | Status: AC
  Administered 2019-05-08 (×2): 2 g via INTRAVENOUS
  Filled 2019-05-08 (×2): qty 100

## 2019-05-08 MED ORDER — LUNG SURGERY BOOK
Freq: Once | Status: AC
  Administered 2019-05-08: 1
  Filled 2019-05-08: qty 1

## 2019-05-08 MED ORDER — PROPOFOL 10 MG/ML IV BOLUS
INTRAVENOUS | Status: DC | PRN
  Administered 2019-05-08: 30 mg via INTRAVENOUS
  Administered 2019-05-08: 170 mg via INTRAVENOUS

## 2019-05-08 MED ORDER — ROCURONIUM BROMIDE 10 MG/ML (PF) SYRINGE
PREFILLED_SYRINGE | INTRAVENOUS | Status: AC
  Filled 2019-05-08: qty 10

## 2019-05-08 MED ORDER — NALOXONE HCL 0.4 MG/ML IJ SOLN: 0.4000 mg | INTRAMUSCULAR | Status: DC | PRN

## 2019-05-08 MED ORDER — INSULIN PUMP
Freq: Three times a day (TID) | SUBCUTANEOUS | Status: DC
  Administered 2019-05-08 – 2019-05-09 (×2): via SUBCUTANEOUS
  Administered 2019-05-09: 8 via SUBCUTANEOUS
  Administered 2019-05-09 – 2019-05-10 (×4): via SUBCUTANEOUS
  Administered 2019-05-10: 1 via SUBCUTANEOUS
  Administered 2019-05-10: 1.4 via SUBCUTANEOUS
  Administered 2019-05-10 (×2): via SUBCUTANEOUS
  Administered 2019-05-11: 2.1 via SUBCUTANEOUS
  Administered 2019-05-11: 0.6 via SUBCUTANEOUS
  Administered 2019-05-11: 8.4 via SUBCUTANEOUS
  Administered 2019-05-11: 2.1 via SUBCUTANEOUS
  Administered 2019-05-11: 7.8 via SUBCUTANEOUS
  Administered 2019-05-12: 07:00:00 via SUBCUTANEOUS
  Administered 2019-05-12: 8.4 via SUBCUTANEOUS
  Administered 2019-05-12 (×2): via SUBCUTANEOUS
  Administered 2019-05-13: 3.4 via SUBCUTANEOUS
  Administered 2019-05-13: 3.5 via SUBCUTANEOUS
  Filled 2019-05-08: qty 1

## 2019-05-08 MED ORDER — KETOROLAC TROMETHAMINE 15 MG/ML IJ SOLN
15.0000 mg | Freq: Four times a day (QID) | INTRAMUSCULAR | Status: AC
  Administered 2019-05-08 – 2019-05-11 (×12): 15 mg via INTRAVENOUS
  Filled 2019-05-08 (×12): qty 1

## 2019-05-08 MED ORDER — FENTANYL CITRATE (PF) 250 MCG/5ML IJ SOLN
INTRAMUSCULAR | Status: AC
  Filled 2019-05-08: qty 5

## 2019-05-08 MED ORDER — ONDANSETRON HCL 4 MG/2ML IJ SOLN: 4.0000 mg | Freq: Four times a day (QID) | INTRAMUSCULAR | Status: DC | PRN

## 2019-05-08 MED ORDER — OXYCODONE HCL 5 MG PO TABS
5.0000 mg | ORAL_TABLET | ORAL | Status: DC | PRN
  Administered 2019-05-12: 10 mg via ORAL
  Filled 2019-05-08: qty 2

## 2019-05-08 MED ORDER — CEFAZOLIN SODIUM-DEXTROSE 2-4 GM/100ML-% IV SOLN
2.0000 g | INTRAVENOUS | Status: AC
  Administered 2019-05-08: 2 g via INTRAVENOUS

## 2019-05-08 MED ORDER — LISINOPRIL 5 MG PO TABS
5.0000 mg | ORAL_TABLET | Freq: Every day | ORAL | Status: DC
  Administered 2019-05-09 – 2019-05-13 (×5): 5 mg via ORAL
  Filled 2019-05-08 (×5): qty 1

## 2019-05-08 MED ORDER — 0.9 % SODIUM CHLORIDE (POUR BTL) OPTIME
TOPICAL | Status: DC | PRN
  Administered 2019-05-08: 3000 mL

## 2019-05-08 MED ORDER — LACTATED RINGERS IV SOLN
INTRAVENOUS | Status: DC | PRN
  Administered 2019-05-08 (×2): via INTRAVENOUS

## 2019-05-08 MED ORDER — HEMOSTATIC AGENTS (NO CHARGE) OPTIME
TOPICAL | Status: DC | PRN
  Administered 2019-05-08: 1 via TOPICAL

## 2019-05-08 MED ORDER — BUPIVACAINE HCL (PF) 0.5 % IJ SOLN
INTRAMUSCULAR | Status: AC
  Filled 2019-05-08: qty 30

## 2019-05-08 MED ORDER — MIDAZOLAM HCL 2 MG/2ML IJ SOLN
INTRAMUSCULAR | Status: AC
  Filled 2019-05-08: qty 2

## 2019-05-08 MED ORDER — ONDANSETRON HCL 4 MG/2ML IJ SOLN
INTRAMUSCULAR | Status: DC | PRN
  Administered 2019-05-08: 4 mg via INTRAVENOUS

## 2019-05-08 MED ORDER — EPHEDRINE 5 MG/ML INJ
INTRAVENOUS | Status: AC
  Filled 2019-05-08: qty 10

## 2019-05-08 MED ORDER — ACETAMINOPHEN 500 MG PO TABS
1000.0000 mg | ORAL_TABLET | Freq: Four times a day (QID) | ORAL | Status: DC
  Administered 2019-05-08 – 2019-05-13 (×15): 1000 mg via ORAL
  Filled 2019-05-08 (×15): qty 2

## 2019-05-08 MED ORDER — FENTANYL CITRATE (PF) 100 MCG/2ML IJ SOLN
INTRAMUSCULAR | Status: DC | PRN
  Administered 2019-05-08 (×4): 50 ug via INTRAVENOUS
  Administered 2019-05-08 (×2): 100 ug via INTRAVENOUS
  Administered 2019-05-08 (×2): 50 ug via INTRAVENOUS

## 2019-05-08 MED ORDER — INSULIN ASPART 100 UNIT/ML ~~LOC~~ SOLN
0.0000 [IU] | Freq: Three times a day (TID) | SUBCUTANEOUS | Status: DC
  Administered 2019-05-08: 1.4 [IU] via SUBCUTANEOUS
  Administered 2019-05-10 (×2): 6 [IU] via SUBCUTANEOUS
  Administered 2019-05-11: 2.1 [IU] via SUBCUTANEOUS

## 2019-05-08 MED ORDER — ENOXAPARIN SODIUM 40 MG/0.4ML ~~LOC~~ SOLN
40.0000 mg | Freq: Every day | SUBCUTANEOUS | Status: DC
  Administered 2019-05-09 – 2019-05-12 (×4): 40 mg via SUBCUTANEOUS
  Filled 2019-05-08 (×4): qty 0.4

## 2019-05-08 MED ORDER — ROCURONIUM BROMIDE 10 MG/ML (PF) SYRINGE
PREFILLED_SYRINGE | INTRAVENOUS | Status: DC | PRN
  Administered 2019-05-08: 60 mg via INTRAVENOUS
  Administered 2019-05-08: 30 mg via INTRAVENOUS
  Administered 2019-05-08: 40 mg via INTRAVENOUS

## 2019-05-08 MED ORDER — LACTATED RINGERS IV SOLN
INTRAVENOUS | Status: DC | PRN
  Administered 2019-05-08: 09:00:00 via INTRAVENOUS

## 2019-05-08 MED ORDER — SENNOSIDES-DOCUSATE SODIUM 8.6-50 MG PO TABS
1.0000 | ORAL_TABLET | Freq: Every day | ORAL | Status: DC
  Administered 2019-05-08 – 2019-05-10 (×3): 1 via ORAL
  Filled 2019-05-08 (×5): qty 1

## 2019-05-08 MED ORDER — OXYCODONE HCL 5 MG PO TABS: 5.0000 mg | ORAL_TABLET | Freq: Once | ORAL | Status: DC | PRN

## 2019-05-08 MED ORDER — CEFAZOLIN SODIUM-DEXTROSE 2-4 GM/100ML-% IV SOLN
INTRAVENOUS | Status: AC
  Filled 2019-05-08: qty 100

## 2019-05-08 MED ORDER — ACETAMINOPHEN 160 MG/5ML PO SOLN
1000.0000 mg | Freq: Four times a day (QID) | ORAL | Status: DC
  Filled 2019-05-08: qty 40.6

## 2019-05-08 MED ORDER — SERTRALINE HCL 100 MG PO TABS
100.0000 mg | ORAL_TABLET | Freq: Every day | ORAL | Status: DC
  Administered 2019-05-09 – 2019-05-13 (×5): 100 mg via ORAL
  Filled 2019-05-08 (×5): qty 1

## 2019-05-08 MED ORDER — HYDROMORPHONE HCL 1 MG/ML IJ SOLN: 0.2500 mg | INTRAMUSCULAR | Status: DC | PRN

## 2019-05-08 MED ORDER — PROMETHAZINE HCL 25 MG/ML IJ SOLN: 6.2500 mg | INTRAMUSCULAR | Status: DC | PRN

## 2019-05-08 MED ORDER — SODIUM CHLORIDE 0.9% FLUSH: 9.0000 mL | INTRAVENOUS | Status: DC | PRN

## 2019-05-08 MED ORDER — IPRATROPIUM-ALBUTEROL 0.5-2.5 (3) MG/3ML IN SOLN
3.0000 mL | Freq: Four times a day (QID) | RESPIRATORY_TRACT | Status: DC
  Administered 2019-05-08 – 2019-05-09 (×2): 3 mL via RESPIRATORY_TRACT
  Filled 2019-05-08: qty 3

## 2019-05-08 MED ORDER — SODIUM CHLORIDE 0.9 % IV SOLN: INTRAVENOUS | Status: DC | PRN

## 2019-05-08 MED ORDER — DEXAMETHASONE SODIUM PHOSPHATE 10 MG/ML IJ SOLN
INTRAMUSCULAR | Status: AC
  Filled 2019-05-08: qty 1

## 2019-05-08 MED ORDER — PHENYLEPHRINE HCL-NACL 10-0.9 MG/250ML-% IV SOLN
INTRAVENOUS | Status: DC | PRN
  Administered 2019-05-08: 25 ug/min via INTRAVENOUS

## 2019-05-08 MED ORDER — ONDANSETRON HCL 4 MG/2ML IJ SOLN
INTRAMUSCULAR | Status: AC
  Filled 2019-05-08: qty 2

## 2019-05-08 MED ORDER — BUPIVACAINE LIPOSOME 1.3 % IJ SUSP
INTRAMUSCULAR | Status: DC | PRN
  Administered 2019-05-08: 100 mL

## 2019-05-08 MED ORDER — HYDROMORPHONE 1 MG/ML IV SOLN
INTRAVENOUS | Status: AC
  Administered 2019-05-08: 30 mg via INTRAVENOUS
  Administered 2019-05-08: 0 mg via INTRAVENOUS
  Administered 2019-05-09: 0.3 mg via INTRAVENOUS
  Administered 2019-05-09: 0 mg via INTRAVENOUS
  Administered 2019-05-09: 0.6 mg via INTRAVENOUS
  Administered 2019-05-09: 0.3 mg via INTRAVENOUS
  Administered 2019-05-09 (×2): 0 mg via INTRAVENOUS
  Administered 2019-05-10: 0.9 mg via INTRAVENOUS
  Administered 2019-05-10: 0 mg via INTRAVENOUS
  Administered 2019-05-10: 0.3 mg via INTRAVENOUS
  Administered 2019-05-10: 0.9 mg via INTRAVENOUS
  Administered 2019-05-11: 0.6 mg via INTRAVENOUS
  Administered 2019-05-11 (×2): 0.3 mg via INTRAVENOUS
  Administered 2019-05-11: 0.6 mg via INTRAVENOUS
  Administered 2019-05-11: 0.9 mg via INTRAVENOUS
  Administered 2019-05-11: 0 mg via INTRAVENOUS
  Administered 2019-05-12: 0.3 mg via INTRAVENOUS
  Filled 2019-05-08: qty 30

## 2019-05-08 MED ORDER — VECURONIUM BROMIDE 10 MG IV SOLR
INTRAVENOUS | Status: AC
  Filled 2019-05-08: qty 10

## 2019-05-08 MED ORDER — BISACODYL 5 MG PO TBEC
10.0000 mg | DELAYED_RELEASE_TABLET | Freq: Every day | ORAL | Status: DC
  Administered 2019-05-08 – 2019-05-12 (×4): 10 mg via ORAL
  Filled 2019-05-08 (×5): qty 2

## 2019-05-08 MED ORDER — ASPIRIN 81 MG PO CHEW
81.0000 mg | CHEWABLE_TABLET | Freq: Every day | ORAL | Status: DC
  Administered 2019-05-08 – 2019-05-13 (×6): 81 mg via ORAL
  Filled 2019-05-08 (×6): qty 1

## 2019-05-08 MED ORDER — CHLORHEXIDINE GLUCONATE CLOTH 2 % EX PADS
6.0000 | MEDICATED_PAD | Freq: Every day | CUTANEOUS | Status: DC
  Administered 2019-05-08 – 2019-05-12 (×4): 6 via TOPICAL

## 2019-05-08 MED ORDER — DIPHENHYDRAMINE HCL 12.5 MG/5ML PO ELIX
12.5000 mg | ORAL_SOLUTION | Freq: Four times a day (QID) | ORAL | Status: DC | PRN
  Filled 2019-05-08: qty 5

## 2019-05-08 MED ORDER — OXYCODONE HCL 5 MG/5ML PO SOLN: 5.0000 mg | Freq: Once | ORAL | Status: DC | PRN

## 2019-05-08 MED ORDER — PANTOPRAZOLE SODIUM 40 MG PO TBEC
40.0000 mg | DELAYED_RELEASE_TABLET | Freq: Every day | ORAL | Status: DC
  Administered 2019-05-08 – 2019-05-13 (×6): 40 mg via ORAL
  Filled 2019-05-08 (×6): qty 1

## 2019-05-08 SURGICAL SUPPLY — 101 items
ADH SKN CLS APL DERMABOND .7 (GAUZE/BANDAGES/DRESSINGS) ×1
APPLIER CLIP ROT 10 11.4 M/L (STAPLE)
APR CLP MED LRG 11.4X10 (STAPLE)
BAG SPEC RTRVL LRG 6X4 10 (ENDOMECHANICALS) ×1
BLADE CLIPPER SURG (BLADE) ×3 IMPLANT
CANISTER SUCT 3000ML PPV (MISCELLANEOUS) ×3 IMPLANT
CATH THORACIC 28FR (CATHETERS) IMPLANT
CATH THORACIC 28FR RT ANG (CATHETERS) IMPLANT
CATH THORACIC 36FR (CATHETERS) IMPLANT
CATH THORACIC 36FR RT ANG (CATHETERS) IMPLANT
CLIP APPLIE ROT 10 11.4 M/L (STAPLE) IMPLANT
CLIP VESOCCLUDE MED 6/CT (CLIP) ×5 IMPLANT
CONN ST 1/4X3/8  BEN (MISCELLANEOUS)
CONN ST 1/4X3/8 BEN (MISCELLANEOUS) IMPLANT
CONN Y 3/8X3/8X3/8  BEN (MISCELLANEOUS)
CONN Y 3/8X3/8X3/8 BEN (MISCELLANEOUS) IMPLANT
CONT SPEC 4OZ CLIKSEAL STRL BL (MISCELLANEOUS) ×30 IMPLANT
COVER SURGICAL LIGHT HANDLE (MISCELLANEOUS) IMPLANT
COVER WAND RF STERILE (DRAPES) ×1 IMPLANT
CUTTER ECHEON FLEX ENDO 45 340 (ENDOMECHANICALS) ×2 IMPLANT
DEFOGGER SCOPE WARMER CLEARIFY (MISCELLANEOUS) IMPLANT
DERMABOND ADVANCED (GAUZE/BANDAGES/DRESSINGS) ×2
DERMABOND ADVANCED .7 DNX12 (GAUZE/BANDAGES/DRESSINGS) IMPLANT
DRAIN CHANNEL 28F RND 3/8 FF (WOUND CARE) IMPLANT
DRAIN CHANNEL 32F RND 10.7 FF (WOUND CARE) IMPLANT
DRAPE CV SPLIT W-CLR ANES SCRN (DRAPES) ×3 IMPLANT
DRAPE ORTHO SPLIT 77X108 STRL (DRAPES) ×3
DRAPE SURG ORHT 6 SPLT 77X108 (DRAPES) ×1 IMPLANT
DRAPE WARM FLUID 44X44 (DRAPES) ×3 IMPLANT
ELECT BLADE 6.5 EXT (BLADE) ×3 IMPLANT
ELECT REM PT RETURN 9FT ADLT (ELECTROSURGICAL) ×3
ELECTRODE REM PT RTRN 9FT ADLT (ELECTROSURGICAL) ×1 IMPLANT
GAUZE SPONGE 4X4 12PLY STRL (GAUZE/BANDAGES/DRESSINGS) ×3 IMPLANT
GAUZE SPONGE 4X4 12PLY STRL LF (GAUZE/BANDAGES/DRESSINGS) ×2 IMPLANT
GLOVE SURG SIGNA 7.5 PF LTX (GLOVE) ×6 IMPLANT
GOWN STRL REUS W/ TWL LRG LVL3 (GOWN DISPOSABLE) ×2 IMPLANT
GOWN STRL REUS W/ TWL XL LVL3 (GOWN DISPOSABLE) ×1 IMPLANT
GOWN STRL REUS W/TWL LRG LVL3 (GOWN DISPOSABLE) ×9
GOWN STRL REUS W/TWL XL LVL3 (GOWN DISPOSABLE) ×3
HEMOSTAT SURGICEL 2X14 (HEMOSTASIS) ×2 IMPLANT
KIT BASIN OR (CUSTOM PROCEDURE TRAY) ×3 IMPLANT
KIT SUCTION CATH 14FR (SUCTIONS) IMPLANT
KIT TURNOVER KIT B (KITS) ×3 IMPLANT
NDL HYPO 25GX1X1/2 BEV (NEEDLE) ×1 IMPLANT
NDL SPNL 18GX3.5 QUINCKE PK (NEEDLE) IMPLANT
NEEDLE HYPO 25GX1X1/2 BEV (NEEDLE) ×3 IMPLANT
NEEDLE SPNL 18GX3.5 QUINCKE PK (NEEDLE) IMPLANT
NS IRRIG 1000ML POUR BTL (IV SOLUTION) ×9 IMPLANT
PACK CHEST (CUSTOM PROCEDURE TRAY) ×3 IMPLANT
PAD ARMBOARD 7.5X6 YLW CONV (MISCELLANEOUS) ×6 IMPLANT
POUCH ENDO CATCH II 15MM (MISCELLANEOUS) IMPLANT
POUCH SPECIMEN RETRIEVAL 10MM (ENDOMECHANICALS) ×2 IMPLANT
RELOAD STAPLE 35X2.5 WHT THIN (STAPLE) IMPLANT
RELOAD STAPLE 45 4.1 GRN THCK (STAPLE) IMPLANT
RELOAD STAPLE 45 GOLD REG/THCK (STAPLE) IMPLANT
SCISSORS LAP 5X35 DISP (ENDOMECHANICALS) IMPLANT
SEALANT PROGEL (MISCELLANEOUS) IMPLANT
SEALANT SURG COSEAL 4ML (VASCULAR PRODUCTS) IMPLANT
SEALANT SURG COSEAL 8ML (VASCULAR PRODUCTS) IMPLANT
SHEARS HARMONIC HDI 20CM (ELECTROSURGICAL) ×2 IMPLANT
SOL ANTI FOG 6CC (MISCELLANEOUS) ×1 IMPLANT
SOLUTION ANTI FOG 6CC (MISCELLANEOUS) ×2
SPECIMEN JAR MEDIUM (MISCELLANEOUS) IMPLANT
SPONGE INTESTINAL PEANUT (DISPOSABLE) ×9 IMPLANT
SPONGE TONSIL TAPE 1 RFD (DISPOSABLE) ×3 IMPLANT
STAPLE RELOAD 2.5MM WHITE (STAPLE) ×18 IMPLANT
STAPLE RELOAD 45 GRN (STAPLE) ×6 IMPLANT
STAPLE RELOAD 45MM GOLD (STAPLE) ×30 IMPLANT
STAPLE RELOAD 45MM GREEN (STAPLE) ×18
STAPLER VASCULAR ECHELON 35 (CUTTER) ×3 IMPLANT
STOPCOCK 4 WAY LG BORE MALE ST (IV SETS) ×3 IMPLANT
SUT PDS AB 3-0 SH 27 (SUTURE) IMPLANT
SUT PROLENE 4 0 RB 1 (SUTURE)
SUT PROLENE 4-0 RB1 .5 CRCL 36 (SUTURE) IMPLANT
SUT SILK  1 MH (SUTURE) ×4
SUT SILK 1 MH (SUTURE) ×2 IMPLANT
SUT SILK 1 TIES 10X30 (SUTURE) ×3 IMPLANT
SUT SILK 2 0 SH (SUTURE) IMPLANT
SUT SILK 2 0SH CR/8 30 (SUTURE) IMPLANT
SUT SILK 3 0 SH 30 (SUTURE) IMPLANT
SUT SILK 3 0SH CR/8 30 (SUTURE) ×3 IMPLANT
SUT VIC AB 1 CTX 36 (SUTURE) ×3
SUT VIC AB 1 CTX36XBRD ANBCTR (SUTURE) ×1 IMPLANT
SUT VIC AB 2-0 CTX 36 (SUTURE) ×3 IMPLANT
SUT VIC AB 2-0 UR6 27 (SUTURE) ×4 IMPLANT
SUT VIC AB 3-0 MH 27 (SUTURE) IMPLANT
SUT VIC AB 3-0 X1 27 (SUTURE) ×3 IMPLANT
SUT VICRYL 2 TP 1 (SUTURE) IMPLANT
SYR 10ML LL (SYRINGE) ×3 IMPLANT
SYR 30ML LL (SYRINGE) ×3 IMPLANT
SYR 50ML LL SCALE MARK (SYRINGE) ×3 IMPLANT
SYSTEM SAHARA CHEST DRAIN ATS (WOUND CARE) ×3 IMPLANT
TAPE CLOTH 4X10 WHT NS (GAUZE/BANDAGES/DRESSINGS) ×3 IMPLANT
TAPE CLOTH SURG 4X10 WHT LF (GAUZE/BANDAGES/DRESSINGS) ×2 IMPLANT
TIP APPLICATOR SPRAY EXTEND 16 (VASCULAR PRODUCTS) IMPLANT
TOWEL GREEN STERILE (TOWEL DISPOSABLE) ×3 IMPLANT
TOWEL GREEN STERILE FF (TOWEL DISPOSABLE) ×3 IMPLANT
TRAY FOLEY MTR SLVR 16FR STAT (SET/KITS/TRAYS/PACK) ×3 IMPLANT
TROCAR XCEL BLADELESS 5X75MML (TROCAR) ×3 IMPLANT
TUBING EXTENTION W/L.L. (IV SETS) ×3 IMPLANT
WATER STERILE IRR 1000ML POUR (IV SOLUTION) ×3 IMPLANT

## 2019-05-08 NOTE — Transfer of Care (Signed)
Immediate Anesthesia Transfer of Care Note  Patient: CAETANO HERSHNER  Procedure(s) Performed: Right VIDEO ASSISTED THORACOSCOPY with   MIDDLE LOBECTOMY and Enbloc portion of Upper Lobe with Node dissection, Intercostal Nerve Block (Right Chest)  Patient Location: PACU  Anesthesia Type:General  Level of Consciousness: awake, alert , oriented and patient cooperative  Airway & Oxygen Therapy: Patient Spontanous Breathing and Patient connected to face mask oxygen  Post-op Assessment: Report given to RN and Post -op Vital signs reviewed and stable  Post vital signs: Reviewed and stable  Last Vitals:  Vitals Value Taken Time  BP 98/55 05/08/19 1319  Temp    Pulse 92 05/08/19 1324  Resp 13 05/08/19 1324  SpO2 100 % 05/08/19 1324  Vitals shown include unvalidated device data.  Last Pain:  Vitals:   05/08/19 0612  TempSrc: Oral  PainSc: 0-No pain         Complications: No apparent anesthesia complications

## 2019-05-08 NOTE — Progress Notes (Signed)
Pt out of OR and in PACU. A&Ox4. CBG 298. Pt has insulin pump with a basal rate. Pt able to enter appropriate information and have pump administer a bolus dose. Bolus dose 2.9 for blood sugar.

## 2019-05-08 NOTE — Interval H&P Note (Signed)
History and Physical Interval Note:  05/08/2019 8:25 AM  Jose Harvey  has presented today for surgery, with the diagnosis of RIGHT UPPER AND MIDDLE NODULES.  The various methods of treatment have been discussed with the patient and family. After consideration of risks, benefits and other options for treatment, the patient has consented to  Procedure(s): VIDEO ASSISTED THORACOSCOPY (VATS)/RIGHT UPPER AND MIDDLE LOBECTOMY (Right) as a surgical intervention.  [name] history has been reviewed, patient examined, no change in status, stable for surgery.  I have reviewed the patient's chart and labs.  Questions were answered to the patient's satisfaction.     Melrose Nakayama

## 2019-05-08 NOTE — Plan of Care (Signed)
  Problem: Education: Goal: Knowledge of disease or condition will improve Outcome: Progressing Goal: Knowledge of the prescribed therapeutic regimen will improve Outcome: Progressing   Problem: Activity: Goal: Risk for activity intolerance will decrease Outcome: Progressing   Problem: Cardiac: Goal: Will achieve and/or maintain hemodynamic stability Outcome: Progressing   Problem: Clinical Measurements: Goal: Postoperative complications will be avoided or minimized Outcome: Progressing   Problem: Respiratory: Goal: Respiratory status will improve Outcome: Progressing   Problem: Pain Management: Goal: Pain level will decrease Outcome: Progressing   Problem: Skin Integrity: Goal: Wound healing without signs and symptoms infection will improve Outcome: Progressing   Problem: Education: Goal: Knowledge of General Education information will improve Description: Including pain rating scale, medication(s)/side effects and non-pharmacologic comfort measures Outcome: Progressing   Problem: Health Behavior/Discharge Planning: Goal: Ability to manage health-related needs will improve Outcome: Progressing   Problem: Clinical Measurements: Goal: Ability to maintain clinical measurements within normal limits will improve Outcome: Progressing Goal: Will remain free from infection Outcome: Progressing Goal: Diagnostic test results will improve Outcome: Progressing Goal: Respiratory complications will improve Outcome: Progressing   Problem: Activity: Goal: Risk for activity intolerance will decrease Outcome: Progressing   Problem: Nutrition: Goal: Adequate nutrition will be maintained Outcome: Progressing   Problem: Elimination: Goal: Will not experience complications related to bowel motility Outcome: Progressing Goal: Will not experience complications related to urinary retention Outcome: Progressing   Problem: Pain Managment: Goal: General experience of comfort will  improve Outcome: Progressing   Problem: Safety: Goal: Ability to remain free from injury will improve Outcome: Progressing

## 2019-05-08 NOTE — Op Note (Signed)
NAME: Jose Harvey, VIEW MEDICAL RECORD [id number] ACCOUNT [id number] DATE OF BIRTH:[birth year] FACILITY: [facility] LOCATION: [facility] PHYSICIAN: Chaya Jan, MD  OPERATIVE REPORT  DATE OF PROCEDURE:  05/08/2019  PREOPERATIVE DIAGNOSIS:  Ground-glass opacity involving right middle and upper lobes.  POSTOPERATIVE DIAGNOSIS:  Atypical lymphoid proliferation.  PROCEDURE:   1.  Right video-assisted thoracoscopy.  2.  Right middle lobectomy with en bloc resection of portion of right upper lobe.  3.  Lymph node dissection.  4.  Intercostal nerve blocks- levels 3 through 10.  SURGEON:  Modesto Charon, MD  ASSISTANT:  Enid Cutter, PA  ANESTHESIA:  General.  FINDINGS:  Frozen section revealed atypical lymphoid proliferation. Bronchial margin clear, but atypical cells at parenchymal margin.  CLINICAL NOTE:  Jose Harvey is a 58 year old gentleman with a history of tobacco abuse prior to quitting 10 years ago.  He entered the lung cancer screening program about 3 years ago in 2017.  A CT showed multiple small pulmonary nodules along with  ground-glass attenuation in the right middle lobe.  There was no activity on PET CT.  He has been followed since that time, but more recently, there has been a significant increase in the size of the right middle lobe opacity which now crosses the  fissure and involves the inferior aspect of the right upper lobe with a mean diameter of 5 cm.  He was referred for surgical resection.  The indications, risks, benefits and alternatives were discussed in detail with the patient.  He understood and  accepted the risks and agreed to proceed.  OPERATIVE NOTE:  Jose Harvey was brought to the preoperative holding area on 05/08/2019.  Anesthesia placed a central venous catheter and an arterial blood pressure monitoring line.  He was taken to the operating room, anesthetized and intubated with a  double-lumen endotracheal tube.  Intravenous antibiotics were  administered.  Sequential compression devices were placed on the calves for DVT prophylaxis.  A Foley catheter was placed.  Intravenous antibiotics were administered.  He was placed in a left  lateral decubitus position and the right chest was prepped and draped in the usual sterile fashion.  Single-lung ventilation of the left lung was initiated and was tolerated well throughout the procedure.  A timeout was performed.  A solution containing 20 mL of liposomal bupivacaine, 30 mL of 0.5% bupivacaine and 50 mL of saline was prepared.  This was used for local at the injection sites, as well as for the intercostal nerve blocks.  An incision was made in the 8th interspace in the midaxillary line.  A 5 mm port was inserted into the chest.  The thoracoscope was advanced into the chest.  A 5 cm working incision was made in the 4th interspace anterolaterally.  No rib spreading was  performed during the procedure.  The inferior ligament was divided and the pleural reflection was divided at the hilum posteriorly.  There were multiple subcarinal nodes that were removed.  All nodes that were removed were sent as separate specimens for  permanent pathology.  The pleural reflection was divided anteriorly.  The fissure was relatively complete over the pulmonary artery near the confluence of the fissures and the pleura was incised at that area.  The remainder of the major fissure between  the middle and lower lobes was completed with an Echelon stapler using gold cartridges.  The right middle lobe vein was identified.  It was dissected out and divided with the vascular stapler.  Nodes then were dissected off the  middle lobe bronchus.   This was a relatively tedious process.  In order to pass a stapler across the middle lobe bronchus, a separate port incision was made posteriorly.  This provided an angle which the stapler could be placed.  The stapler was closed, but not fired.  A test  inflation showed good aeration of  the upper and lower lobes.  The stapler then was fired, transecting the bronchus.  The middle lobe artery branches then were dissected out.  They were encircled and divided with the endoscopic vascular stapler.  The  major fissure between the superior segment of the lower lobe and the upper lobe then was completed with sequential firings of the Echelon stapler.  Multiple lymph nodes were encountered during this dissection.  All were removed and sent as separate  specimens.  As the abnormality clearly crossed the fissure, a portion of the inferior aspect of the right upper lobe was taken en bloc with the middle lobe.  This was done with sequential firings of an Echelon stapler using both gold and green  cartridges.  Once the specimen was completely separated from the upper lobe, it was placed into an endoscopic retrieval bag, removed and sent for frozen section.  While awaiting the results of the frozen section. Intercostal nerve blocks were performed  from the 3rd to the 10th interspace.  Ten mL of the bupivacaine solution was injected into a subpleural plane at each level.  The frozen section returned with no evidence of carcinoma.  There was an abnormal lymphoid proliferation, possibly lymphoma, but  the diagnosis could not be made on frozen section.  There were some atypical lymphoid cells at the parenchymal margin.  The bronchial margin was clear.  Inspection of the inferior aspect of the right upper lobe revealed that it would have been difficult to  take any more tissue without severely compromising remaining functional right upper lobe.  Given that this was not a carcinoma,  the decision was made not to pursue additional resection.  Level 4R right paratracheal nodes then were removed and  sent as separate specimens.  The chest was copiously irrigated with warm saline.  A test inflation at 30 cm of water revealed no significant air leak.  A 28-French chest tube was placed through the original port  incision and secured with #1 silk suture.   The remaining port incisions were closed with a 0 Vicryl fascial suture, followed by 3-0 subcuticular sutures.  The working incision was closed in 3 layers in standard fashion.  All sponge, needle and instrument counts were correct at the end of the  procedure.  The patient was extubated in the operating room and taken to the postanesthetic care unit in good condition.  VN/NUANCE  D:05/08/2019 T:05/08/2019 JOB:[id number]

## 2019-05-08 NOTE — Progress Notes (Signed)
CBG 278. Pt self bolused with insulin pump. 6.1 units.

## 2019-05-08 NOTE — Anesthesia Procedure Notes (Signed)
Arterial Line Insertion Start/End12/08/2018 6:45 AM, 05/08/2019 6:50 AM Performed by: Valda Favia, CRNA, CRNA  Patient location: Pre-op. Preanesthetic checklist: patient identified, IV checked, site marked, risks and benefits discussed, surgical consent, monitors and equipment checked, pre-op evaluation, timeout performed and anesthesia consent Lidocaine 1% used for infiltration radial was placed Catheter size: 20 G Hand hygiene performed  and maximum sterile barriers used   Attempts: 1 Procedure performed without using ultrasound guided technique. Following insertion, Biopatch. Post procedure assessment: normal  Patient tolerated the procedure well with no immediate complications.

## 2019-05-08 NOTE — Anesthesia Procedure Notes (Signed)
Procedure Name: Intubation Date/Time: 05/08/2019 9:16 AM Performed by: Colin Benton, CRNA Pre-anesthesia Checklist: Patient identified, Emergency Drugs available, Suction available and Patient being monitored Patient Re-evaluated:Patient Re-evaluated prior to induction Oxygen Delivery Method: Circle system utilized Preoxygenation: Pre-oxygenation with 100% oxygen Induction Type: IV induction Ventilation: Mask ventilation without difficulty and Oral airway inserted - appropriate to patient size Laryngoscope Size: Glidescope and 4 Grade View: Grade I Endobronchial tube: Left, Double lumen EBT, EBT position confirmed by auscultation and EBT position confirmed by fiberoptic bronchoscope and 39 Fr Number of attempts: 2 Airway Equipment and Method: Video-laryngoscopy and Stylet Placement Confirmation: positive ETCO2,  breath sounds checked- equal and bilateral and ETT inserted through vocal cords under direct vision Tube secured with: Tape Dental Injury: Teeth and Oropharynx as per pre-operative assessment  Difficulty Due To: Difficulty was anticipated and Difficult Airway- due to anterior larynx Comments: DL x1 with Mil 2.  Grade 3 view.  DL x 2 with glidescope 4.  EBBS and VSS.

## 2019-05-08 NOTE — Discharge Summary (Addendum)
Physician Discharge Summary  Patient ID: DIETRICK KOZINSKI MRN: [id number] DOB/AGE: [birth year] 58 y.o.  Admit date: 05/08/2019 Discharge date: 05/13/2019  Admission Diagnoses: Right middle lobe lung nodule Right lung nodules Diabetes mellitus,  type 1 Hypertension  Dyslipidemia COPD History of prostate cancer   Discharge Diagnoses: MALT lymphoma right middle lobe Right lung nodules S/P thoracotomy with right middle and partial right upper lobectomy and lymph node dissection Diabetes mellitus,  type 1 Hypertension  Dyslipidemia COPD History of prostate cancer  Discharged Condition: stable  History of Present Illness:  Jose Harvey is a 58 year old gentleman with a with a history of type 1 diabetes, tobacco abuse (2 packs/day for 30 years prior to quitting 10 years ago), COPD, vertigo, left adrenal adenoma, prostate cancer, and anxiety.  He was first noted to have a groundglass opacity in the right middle lobe on 3 years ago on a low-dose screening CT for lung cancer.  He has been followed since then.  He had several other smaller nodules that have remained unchanged, but has had an increase in size of the groundglass opacity which now crosses the minor fissure and involves the right upper lobe as well.  There is thickening and enhancement along the fissure.  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  While infectious and inflammatory nodules are in the differential diagnosis, this is a cancer unless it can be proven otherwise.  I had a long discussion with [name] and reviewed the images with him.  His girlfriend listened in by telephone.  We discussed the possibility of attempting to biopsy the lesion.  A positive result would result in a recommendation for surgery.  A negative result could not be trusted and would not change that recommendation.  Therefore I recommended we proceed directly to VATS.  I recommended we do a right VATS for  probable upper and middle lobectomy.  I discussed the general nature of the procedure including the need for general anesthesia, the incisions to be used, the use of drains to postoperatively, the expected hospital stay, and the overall recovery.  He does understand that other treatments may be necessary depending on intraoperative findings.  I informed him of the indications, risks, benefits, and alternatives.  He understands the risks include, but not limited to death, MI, DVT, PE, bleeding, possible need for transfusion, infection, prolonged air leak, cardiac arrhythmias, as well as the possibility of other unforeseeable complications.  He understands and accepts the risks and wishes to proceed.   Hospital Course:  [name] presented to the hospital on 05/08/19 for same-day surgery and was taken to the OR where right VATS with right middle lobectomy and en bloc portion of right lobe along with lymph node dissection was carried out with out complication. Following the procedure, he was extubated,  recovered in the PACU and later transferred to [facility]. Pain was managed early post-op with a PCA and IV Toradol. Glucose was monitored and sliding scale insulin provided as required. He was mobilized on post-op day 1. After the air leak subsided, the chest tube was removed and follow up CXR the following day showed a small, stable apical pneumothorax. Path showed the lung lesion was c/w lymphoma. Oncology consult will be arranged in outpatient setting.     Consults: None  Significant Diagnostic Studies:   Diagnostic Tests: CT CHEST WITHOUT CONTRAST FOR LUNG CANCER SCREENING NODULE FOLLOW-UP  TECHNIQUE: Multidetector CT imaging of the chest was performed following the standard protocol without  IV contrast.  COMPARISON: 09/30/2018, 03/28/2018 and 02/16/2016. PET 02/25/2016. MR abdomen 03/03/2016.  FINDINGS: Cardiovascular: Atherosclerotic calcification of the aorta and coronary  arteries. Heart size normal. No pericardial effusion.  Mediastinum/Nodes: 10 mm low-attenuation left thyroid nodule, unchanged. No pathologically enlarged mediastinal or axillary lymph nodes. Hilar regions are difficult to definitively evaluate without IV contrast but appear grossly unremarkable. Esophagus is unremarkable.  Lungs/Pleura: There is a large area of masslike ground-glass straddling the minor fissure with septal thickening and fissural retraction. Volume derived mean diameter is 49.4 mm, compared to 45.1 mm on 09/30/2018 and 39.2 mm on 03/28/2018. When compared with baseline examination of 02/16/2016, it has increased in size from 28.5 mm. Additional solid and non solid pulmonary nodules measure up to 10.9 mm, similar.  Upper Abdomen: Vague low-attenuation lesions in the liver measure 2.6 x 2.7 cm in the right hepatic lobe and were shown to represent hemangiomas on 03/03/2016. Cholecystectomy. Right adrenal gland is unremarkable. 1.5 x 2.3 cm left adrenal nodule was characterized as an adenoma on 03/03/2016. Visualized portions of the kidneys, spleen, pancreas, stomach and bowel are grossly unremarkable. No upper abdominal adenopathy.  Musculoskeletal: Degenerative changes in the spine. No worrisome lytic or sclerotic lesions.  IMPRESSION: 1. Masslike area of ground-glass straddles the minor fissure with septal thickening and fissural retraction. While the lesion has increased in size minimally from 09/30/2018, there is clear enlargement from baseline examination on 02/16/2016. Therefore, lesion is characterized as worrisome for adenocarcinoma, Lung-RADS 4B, suspicious. Additional imaging evaluation or consultation with Pulmonology or Thoracic Surgery recommended. These results will be called to the ordering clinician or representative by the Radiologist Assistant, and communication documented in the PACS or zVision Dashboard. 2. Liver hemangiomas and left  adrenal adenoma, best characterized on 03/03/2016. 3. Aortic atherosclerosis (ICD10-170.0). Coronary artery calcification.   Electronically Signed By: Lorin Picket M.D. On: 04/08/2019 12:15  EXAM: PORTABLE CHEST 1 VIEW  COMPARISON:  05/12/2019  FINDINGS: Similar size of small right apical pneumothorax. Similar appearance of right upper lobe opacity. No pleural effusion. Normal heart size. Right IJ line is unchanged.  IMPRESSION: Similar size of small right apical pneumothorax and similar right upper lobe opacity. No new findings.   Electronically Signed   By: Macy Mis M.D.   On: 05/13/2019 08:22  Treatments:   05/08/2019  1:06 PM  PATIENT:  Forest Becker  58 y.o. male  PRE-OPERATIVE DIAGNOSIS:  RIGHT UPPER AND MIDDLE NODULES  POST-OPERATIVE DIAGNOSIS:  RIGHT UPPER AND MIDDLE NODULES  PROCEDURE:  Procedure(s): Right VIDEO ASSISTED THORACOSCOPY with   MIDDLE LOBECTOMY and en bloc portion of Right Upper Lobe with Node dissection and  Intercostal Nerve Block (Right)  SURGEON: Melrose Nakayama, MD - Primary  PHYSICIAN ASSISTANT:Roddenberry    ANESTHESIA:   general  EBL:  200 mL   BLOOD ADMINISTERED:none  DRAINS: 44fr pleural tube x 1   LOCAL MEDICATIONS USED:  BUPIVICAINE   SPECIMEN:  Source of Specimen:  Right middle lung lobe with en bloc portion of right upper lobe. Multiple lymph nodes.    Discharge Exam: Blood pressure (!) 142/66, pulse 81, temperature 98.1 F (36.7 C), temperature source Oral, resp. rate 18, height 5\' 11"  (1.803 m), weight 81.7 kg, SpO2 100 %.  Physical Exam: General appearance:alert, cooperative and no distress Neurologic:intact Heart:SR Lungs:breath sounds clear, incisions bruised but intact and dry.  CXR OK  Disposition:    Allergies as of 05/13/2019      Reactions   Crestor [rosuvastatin]    Muscle  aches   Lipitor [atorvastatin]    Muscle aches   Other Other (See  Comments)   Any antihistamines Causes shaking   Zocor [simvastatin] Other (See Comments)   Muscle aches      Medication List    TAKE these medications   amiodarone 200 MG tablet Commonly known as: PACERONE Take 1 tablet (200 mg total) by mouth 2 (two) times daily.   aspirin 81 MG tablet Take 1 tablet (81 mg total) by mouth daily.   CoQ10 100 MG Caps Take 100 mg by mouth daily.   ICaps Areds 2 Caps Take 1 capsule by mouth daily.   insulin pump Soln Inject into the skin continuous. NOVOLOG 100 UNIT/ML VIAL Basal rate per patient is as follows: at 12 midnite- .6 , at 0500am-.8 then at 0800am-.6   KLS Aller-Tec 10 MG tablet Generic drug: cetirizine Take 5 mg by mouth daily.   lisinopril 5 MG tablet Commonly known as: ZESTRIL Take 5 mg by mouth daily.   MOVE FREE JOINT HEALTH ADVANCE PO Take 1 tablet by mouth daily.   oxyCODONE-acetaminophen 5-325 MG tablet Commonly known as: Percocet Take 1 tablet by mouth every 4 (four) hours as needed for up to 5 days for severe pain.   pantoprazole 40 MG tablet Commonly known as: PROTONIX Take 40 mg by mouth daily.   rosuvastatin 5 MG tablet Commonly known as: CRESTOR Take 1 tablet (5 mg total) by mouth daily.   sertraline 100 MG tablet Commonly known as: ZOLOFT Take 100 mg by mouth daily.      Follow-up Information    Melrose Nakayama, MD. Go on 05/27/2019.   Specialty: Cardiothoracic Surgery Why: You have an appointment with Dr. Roxan Hockey on Tuesday 05/27/19 at 12:30pm. Please arrive 30 minutes early for a chest x-ray to be done by [facility] located on the first floor of the same building.  Contact information: [address] [place] [address] [phone]           Signed: Antony Odea , Hershal Coria [id number] 05/13/2019, 8:50 AM

## 2019-05-08 NOTE — Anesthesia Procedure Notes (Signed)
Central Venous Catheter Insertion Performed by: Myrtie Soman, MD, anesthesiologist Start/End12/08/2018 6:54 AM, 05/08/2019 7:07 AM Patient location: Pre-op. Preanesthetic checklist: patient identified, IV checked, site marked, risks and benefits discussed, surgical consent, monitors and equipment checked, pre-op evaluation, timeout performed and anesthesia consent Position: Trendelenburg Lidocaine 1% used for infiltration and patient sedated Hand hygiene performed  and maximum sterile barriers used  Catheter size: 8 Fr Total catheter length 16. Central line was placed.Double lumen Procedure performed using ultrasound guided technique. Ultrasound Notes:anatomy identified, needle tip was noted to be adjacent to the nerve/plexus identified, no ultrasound evidence of intravascular and/or intraneural injection and image(s) printed for medical record Attempts: 1 Following insertion, dressing applied, line sutured and Biopatch. Post procedure assessment: blood return through all ports  Patient tolerated the procedure well with no immediate complications.

## 2019-05-08 NOTE — Anesthesia Procedure Notes (Signed)
Anesthesia Procedure Image    

## 2019-05-08 NOTE — Brief Op Note (Addendum)
05/08/2019  1:06 PM  PATIENT:  Forest Becker  58 y.o. male  PRE-OPERATIVE DIAGNOSIS:  RIGHT UPPER AND MIDDLE NODULES  POST-OPERATIVE DIAGNOSIS:  RIGHT UPPER AND MIDDLE NODULES- LYMPHOID PROLIFERATION  PROCEDURE:  Procedure(s): Right VIDEO ASSISTED THORACOSCOPY RIGHT MIDDLE LOBECTOMY and en bloc portion of Right Upper Lobe LYMPH NODE DISSECTION INTERCOSTAL NERVE BLOCKS  SURGEON: Melrose Nakayama, MD - Primary  PHYSICIAN ASSISTANT:Myron Roddenberry, PA  ANESTHESIA:   general  EBL:  200 mL   BLOOD ADMINISTERED:none  DRAINS: 27fr pleural tube x 1   LOCAL MEDICATIONS USED:  BUPIVICAINE   SPECIMEN:  Source of Specimen:  Right middle lung lobe with en bloc portion of right upper lobe. Multiple lymph nodes.  DISPOSITION OF SPECIMEN:  PATHOLOGY  COUNTS:  YES  DICTATION: .Dragon Dictation  PLAN OF CARE: Admit to inpatient   PATIENT DISPOSITION:  PACU - hemodynamically stable.   Delay start of Pharmacological VTE agent (>24hrs) due to surgical blood loss or risk of bleeding: no  FROZEN = showed atypical lymphoid infiltrate, no carcinoma

## 2019-05-08 NOTE — Progress Notes (Signed)
Dr. Kalman Shan notified that patient still has on his insulin pump and has not adjusted his basal rate because his CBG this morning was 223 when he woke up.  Dr. Kalman Shan spoke with the patient and based on patient's current basal rate, he ordered for the insulin pump to stay as is and not be adjusted.

## 2019-05-08 NOTE — Anesthesia Postprocedure Evaluation (Signed)
Anesthesia Post Note  Patient: Jose Harvey  Procedure(s) Performed: Right VIDEO ASSISTED THORACOSCOPY with   MIDDLE LOBECTOMY and Enbloc portion of Upper Lobe with Node dissection, Intercostal Nerve Block (Right Chest)     Patient location during evaluation: PACU Anesthesia Type: General Level of consciousness: awake and alert Pain management: pain level controlled Vital Signs Assessment: post-procedure vital signs reviewed and stable Respiratory status: spontaneous breathing, nonlabored ventilation, respiratory function stable and patient connected to nasal cannula oxygen Cardiovascular status: blood pressure returned to baseline and stable Postop Assessment: no apparent nausea or vomiting Anesthetic complications: no    Last Vitals:  Vitals:   05/08/19 1904 05/08/19 1923  BP: 117/64   Pulse: 95   Resp: 20 18  Temp: 36.9 C   SpO2: 97% 98%    Last Pain:  Vitals:   05/08/19 1923  TempSrc:   PainSc: 0-No pain                 Catalina Gravel

## 2019-05-09 ENCOUNTER — Inpatient Hospital Stay (HOSPITAL_COMMUNITY): Payer: 59

## 2019-05-09 ENCOUNTER — Encounter (HOSPITAL_COMMUNITY): Payer: Self-pay | Admitting: Thoracic Surgery (Cardiothoracic Vascular Surgery)

## 2019-05-09 ENCOUNTER — Other Ambulatory Visit: Payer: Self-pay

## 2019-05-09 LAB — CBC
HCT: 37.4 % — ABNORMAL LOW (ref 39.0–52.0)
Hemoglobin: 12.3 g/dL — ABNORMAL LOW (ref 13.0–17.0)
MCH: 31.1 pg (ref 26.0–34.0)
MCHC: 32.9 g/dL (ref 30.0–36.0)
MCV: 94.7 fL (ref 80.0–100.0)
Platelets: 193 10*3/uL (ref 150–400)
RBC: 3.95 MIL/uL — ABNORMAL LOW (ref 4.22–5.81)
RDW: 12.9 % (ref 11.5–15.5)
WBC: 16.4 10*3/uL — ABNORMAL HIGH (ref 4.0–10.5)
nRBC: 0 % (ref 0.0–0.2)

## 2019-05-09 LAB — BASIC METABOLIC PANEL
Anion gap: 10 (ref 5–15)
BUN: 23 mg/dL — ABNORMAL HIGH (ref 6–20)
CO2: 24 mmol/L (ref 22–32)
Calcium: 8.3 mg/dL — ABNORMAL LOW (ref 8.9–10.3)
Chloride: 100 mmol/L (ref 98–111)
Creatinine, Ser: 0.92 mg/dL (ref 0.61–1.24)
GFR calc Af Amer: >60 mL/min (ref >60)
GFR calc non Af Amer: >60 mL/min (ref >60)
Glucose, Bld: 296 mg/dL — ABNORMAL HIGH (ref 70–99)
Potassium: 4.1 mmol/L (ref 3.5–5.1)
Sodium: 134 mmol/L — ABNORMAL LOW (ref 135–145)

## 2019-05-09 LAB — GLUCOSE, CAPILLARY
Glucose-Capillary: 307 mg/dL — ABNORMAL HIGH (ref 70–99)
Glucose-Capillary: 256 mg/dL — ABNORMAL HIGH (ref 70–99)

## 2019-05-09 MED ORDER — IPRATROPIUM-ALBUTEROL 0.5-2.5 (3) MG/3ML IN SOLN
3.0000 mL | Freq: Three times a day (TID) | RESPIRATORY_TRACT | Status: DC
  Administered 2019-05-09 (×2): 3 mL via RESPIRATORY_TRACT
  Filled 2019-05-09 (×5): qty 3

## 2019-05-09 MED ORDER — AMIODARONE HCL IN DEXTROSE 360-4.14 MG/200ML-% IV SOLN
60.0000 mg/h | INTRAVENOUS | Status: AC
  Administered 2019-05-09: 60 mg/h via INTRAVENOUS
  Filled 2019-05-09: qty 200

## 2019-05-09 MED ORDER — AMIODARONE LOAD VIA INFUSION
150.0000 mg | Freq: Once | INTRAVENOUS | Status: AC
  Administered 2019-05-09: 150 mg via INTRAVENOUS
  Filled 2019-05-09: qty 83.34

## 2019-05-09 MED ORDER — AMIODARONE HCL IN DEXTROSE 360-4.14 MG/200ML-% IV SOLN
30.0000 mg/h | INTRAVENOUS | Status: DC
  Administered 2019-05-10: 30 mg/h via INTRAVENOUS
  Filled 2019-05-09 (×5): qty 200

## 2019-05-09 NOTE — Progress Notes (Signed)
Pt with new onset Afib RVR. Asymptomatic, VS stable, EKG obtained and placed in chart. MD made aware and new orders received. Will continue to monitor.

## 2019-05-09 NOTE — Plan of Care (Signed)

## 2019-05-09 NOTE — Progress Notes (Signed)
Foley catheter removed per order and pt request. Will continue to monitor.

## 2019-05-09 NOTE — Progress Notes (Signed)
Inpatient Diabetes Program Recommendations  AACE/ADA: New Consensus Statement on Inpatient Glycemic Control (2015)  Target Ranges:  Prepandial:   less than 140 mg/dL      Peak postprandial:   less than 180 mg/dL (1-2 hours)      Critically ill patients:  140 - 180 mg/dL   Lab Results  Component Value Date   GLUCAP 256 (H) 05/09/2019   HGBA1C 6.8 (H) 05/11/2016    Review of Glycemic Control  Diabetes history: DM type 1 sees Dr. Buddy Duty Outpatient Diabetes medications: Medtronic 670G insulin pump with freestyle Libre 14 day CGM Current orders for Inpatient glycemic control:  Insulin pump order set  Patient sees Dr. Buddy Duty, Endocrinologist, outpatient for DM management. Pt last saw Dr. Buddy Duty in March. However, pt has had an A1c checked since then that was a little over 7%.  Insulin pump site: left lower abdomen Site due to be changed on: tomorrow 12/5  Freestyle Libre 14 day sensor site: Right arm placed 3 days ago Site due to be changed on: 12/15  Insulin pump basal rates 12A        0.6 units/hour 5A          0.8 units/hour 8A          0.6 units/hour  Glucose trends in the 200-300 range.  Noted no steroids given during surgery. Informed pt of this. Patient covered glucose with more meal coverage and on last check his glucose per his CGM has come down to 156. Will continue to moniotor glucose trends.  If glucose trends continue to be elevated. Pt to call Dr. Cindra Eves office today for direction of basal rate increase.  Thanks,  Tama Headings RN, MSN, BC-ADM Inpatient Diabetes Coordinator Team Pager [phone] (8a-5p)

## 2019-05-09 NOTE — Progress Notes (Addendum)
      [address]       [place] [address]             [phone]       1 Day Post-Op Procedure(s) (LRB): Right VIDEO ASSISTED THORACOSCOPY with   MIDDLE LOBECTOMY and Enbloc portion of Upper Lobe with Node dissection, Intercostal Nerve Block (Right)  Subjective: Patient without specific complaint this am.   Objective: Vital signs in last 24 hours: Temp:  [97.6 F (36.4 C)-98.7 F (37.1 C)] 98.5 F (36.9 C) (12/04 0415) Pulse Rate:  [84-99] 91 (12/04 0415) Cardiac Rhythm: Normal sinus rhythm (12/04 0700) Resp:  [12-22] 14 (12/04 0415) BP: (96-124)/(49-66) 114/49 (12/04 0415) SpO2:  [91 %-100 %] 95 % (12/04 0415) Arterial Line BP: (120-163)/(42-60) 148/53 (12/03 2351) Weight:  [81.7 kg] 81.7 kg (12/03 1621)     Intake/Output from previous day: 12/03 0701 - 12/04 0700 In: 3140.8 [P.O.:240; I.V.:2600.8; IV Piggyback:300] Out: 2626 [Urine:2300; Blood:200; Chest Tube:126]   Physical Exam:  Cardiovascular: RRR Pulmonary: Clear to auscultation bilaterally Abdomen: Soft, non tender, bowel sounds present. Extremities: Trace lower extremity edema. Wounds: Clean and dry.  No erythema or signs of infection. Chest Tube:to suction  Lab Results: CBC: Recent Labs    05/06/19 1324 05/09/19 0325  WBC 7.7 16.4*  HGB 14.5 12.3*  HCT 43.5 37.4*  PLT 217 193   BMET:  Recent Labs    05/06/19 1324 05/09/19 0325  NA 139 134*  K 4.5 4.1  CL 107 100  CO2 22 24  GLUCOSE 103* 296*  BUN 21* 23*  CREATININE 0.80 0.92  CALCIUM 9.1 8.3*    PT/INR:  Recent Labs    05/06/19 1324  LABPROT 12.5  INR 0.9   ABG:  INR: Will add last result for INR, ABG once components are confirmed Will add last 4 CBG results once components are confirmed  Assessment/Plan:  1. CV - SR with HR in the 80's 2.  Pulmonary - On 5 liters of oxygen via [place]. Chest tube with 126 cc output since surgery. Patient initially had an air leak with cough but with 2 other coughs, he did  not have an air leak. CXR this am shows no pneumothorax, small hematoma/fluid collection adjacent to staple line. Hope to place chest tube to water seal soon. Check CXR in am. Await final pathology. 3. DM-CBGs 298/307/256 . On Insulin pump.  4. Anemia-H and H this am 12.3 and 37.4 5. Remove a line and foley 6. Decrease IVF  Donielle M ZimmermanPA-C 05/09/2019,8:15 AM [phone]  Patient seen and examined, agree with above Looks great Small air leak with repeated cough- will try on water seal  Remo Lipps C. Roxan Hockey, MD [facility] [phone]

## 2019-05-10 ENCOUNTER — Inpatient Hospital Stay (HOSPITAL_COMMUNITY): Payer: 59

## 2019-05-10 LAB — CBC
HCT: 34.4 % — ABNORMAL LOW (ref 39.0–52.0)
Hemoglobin: 11.4 g/dL — ABNORMAL LOW (ref 13.0–17.0)
MCH: 31.3 pg (ref 26.0–34.0)
MCHC: 33.1 g/dL (ref 30.0–36.0)
MCV: 94.5 fL (ref 80.0–100.0)
Platelets: 156 10*3/uL (ref 150–400)
RBC: 3.64 MIL/uL — ABNORMAL LOW (ref 4.22–5.81)
RDW: 13.1 % (ref 11.5–15.5)
WBC: 11.4 10*3/uL — ABNORMAL HIGH (ref 4.0–10.5)
nRBC: 0 % (ref 0.0–0.2)

## 2019-05-10 LAB — COMPREHENSIVE METABOLIC PANEL
ALT: 19 U/L (ref 0–44)
AST: 19 U/L (ref 15–41)
Albumin: 2.6 g/dL — ABNORMAL LOW (ref 3.5–5.0)
Alkaline Phosphatase: 55 U/L (ref 38–126)
Anion gap: 5 (ref 5–15)
BUN: 14 mg/dL (ref 6–20)
CO2: 27 mmol/L (ref 22–32)
Calcium: 8 mg/dL — ABNORMAL LOW (ref 8.9–10.3)
Chloride: 105 mmol/L (ref 98–111)
Creatinine, Ser: 0.78 mg/dL (ref 0.61–1.24)
GFR calc Af Amer: >60 mL/min (ref >60)
GFR calc non Af Amer: >60 mL/min (ref >60)
Glucose, Bld: 202 mg/dL — ABNORMAL HIGH (ref 70–99)
Potassium: 3.9 mmol/L (ref 3.5–5.1)
Sodium: 137 mmol/L (ref 135–145)
Total Bilirubin: 0.7 mg/dL (ref 0.3–1.2)
Total Protein: 5.1 g/dL — ABNORMAL LOW (ref 6.5–8.1)

## 2019-05-10 MED ORDER — IPRATROPIUM-ALBUTEROL 0.5-2.5 (3) MG/3ML IN SOLN: 3.0000 mL | Freq: Three times a day (TID) | RESPIRATORY_TRACT | Status: DC | PRN

## 2019-05-10 NOTE — Progress Notes (Addendum)
[address]       [place] [address]             [phone]      2 Days Post-Op Procedure(s) (LRB): Right VIDEO ASSISTED THORACOSCOPY with   MIDDLE LOBECTOMY and Enbloc portion of Upper Lobe with Node dissection, Intercostal Nerve Block (Right) Subjective: afib overnight, now on amio gtt in sinus, hopefully convert to po tomorrow- no significant cardiac history per patient Some burning pain at times- mostly controlled Objective: Vital signs in last 24 hours: Temp:  [98 F (36.7 C)-99 F (37.2 C)] 98.5 F (36.9 C) (12/05 0718) Pulse Rate:  [72-120] 79 (12/05 0718) Cardiac Rhythm: Normal sinus rhythm (12/05 0831) Resp:  [13-19] 16 (12/05 0831) BP: (103-127)/(59-79) 103/59 (12/05 0718) SpO2:  [96 %-100 %] 97 % (12/05 0831)  Hemodynamic parameters for last 24 hours:    Intake/Output from previous day: 12/04 0701 - 12/05 0700 In: 2000.7 [P.O.:1180; I.V.:820.7] Out: [id number] [Urine:1400; Chest Tube:374] Intake/Output this shift: Total I/O In: 132.9 [I.V.:132.9] Out: 480 [Urine:400; Chest Tube:80]  General appearance: alert, cooperative and no distress Heart: regular rate and rhythm Lungs: clear to auscultation bilaterally Abdomen: benign Extremities: no edema or calf tenderness Wound: incis healing well, some echymosis  Lab Results: Recent Labs    05/09/19 0325 05/10/19 0408  WBC 16.4* 11.4*  HGB 12.3* 11.4*  HCT 37.4* 34.4*  PLT 193 156   BMET:  Recent Labs    05/09/19 0325 05/10/19 0408  NA 134* 137  K 4.1 3.9  CL 100 105  CO2 24 27  GLUCOSE 296* 202*  BUN 23* 14  CREATININE 0.92 0.78  CALCIUM 8.3* 8.0*    PT/INR: No results for input(s): LABPROT, INR in the last 72 hours. ABG    Component Value Date/Time   PHART 7.445 05/06/2019 1324   HCO3 25.1 05/06/2019 1324   O2SAT 94.5 05/06/2019 1324   CBG (last 3)  Recent Labs    05/08/19 2129 05/09/19 0315 05/09/19 0632  GLUCAP 298* 307* 256*    Meds Scheduled Meds: .  acetaminophen  1,000 mg Oral Q6H   Or  . acetaminophen (TYLENOL) oral liquid 160 mg/5 mL  1,000 mg Oral Q6H  . aspirin  81 mg Oral Daily  . bisacodyl  10 mg Oral Daily  . Chlorhexidine Gluconate Cloth  6 each Topical Daily  . enoxaparin (LOVENOX) injection  40 mg Subcutaneous Daily  . HYDROmorphone   Intravenous Q4H  . insulin aspart  0-24 Units Subcutaneous TID AC & HS  . insulin pump   Subcutaneous TID WC, HS, 0200  . ipratropium-albuterol  3 mL Nebulization TID  . ketorolac  15 mg Intravenous Q6H  . lisinopril  5 mg Oral Daily  . pantoprazole  40 mg Oral Daily  . senna-docusate  1 tablet Oral QHS  . sertraline  100 mg Oral Daily   Continuous Infusions: . sodium chloride    . sodium chloride 10 mL/hr at 05/09/19 0843  . amiodarone 30 mg/hr (05/10/19 0900)   PRN Meds:.Place/Maintain arterial line **AND** sodium chloride, diphenhydrAMINE **OR** diphenhydrAMINE, naloxone **AND** sodium chloride flush, ondansetron (ZOFRAN) IV, oxyCODONE  Xrays Dg Chest Port 1 View  Result Date: 05/09/2019 CLINICAL DATA:  Postop VATS procedure and middle lobe lobectomy and partial right upper lobe lobectomy. EXAM: PORTABLE CHEST 1 VIEW COMPARISON:  05/08/2019 FINDINGS: The right chest tube is stable. No definite pneumothorax. The right IJ catheter is in good position, unchanged. The heart  is normal in size. The mediastinal and hilar contours are normal. Stable surgical changes involving the right hilar area with staple lines. Small postoperative hematoma or fluid collection in the right upper lobe. The left lung is clear. IMPRESSION: 1. Stable postoperative changes with a small hematoma or fluid collection adjacent to the staple line in the right upper lobe. 2. Support apparatus in good position, unchanged.  No pneumothorax. Electronically Signed   By: Marijo Sanes M.D.   On: 05/09/2019 06:57   Dg Chest Port 1 View  Result Date: 05/08/2019 CLINICAL DATA:  Postop status post VATS EXAM: PORTABLE CHEST 1  VIEW COMPARISON:  05/06/2019, CT 04/08/2019 FINDINGS: Interim placement of right-sided central venous catheter with tip over the SVC. Placement of right-sided chest tube with tip at the apex. Postsurgical changes in the right hilar region. Focal opacity in the right upper lobe. No visible pneumothorax. Normal heart size. IMPRESSION: 1. Placement of support lines and tubes on the right. Interval postsurgical changes of the right thorax. No discrete pneumothorax is seen 2. Focal airspace disease in the right suprahilar lung presumably due to postsurgical change Electronically Signed   By: Donavan Foil M.D.   On: 05/08/2019 14:06    Assessment/Plan: S/P Procedure(s) (LRB): Right VIDEO ASSISTED THORACOSCOPY with   MIDDLE LOBECTOMY and Enbloc portion of Upper Lobe with Node dissection, Intercostal Nerve Block (Right)  1 hemodyn stable, back in sinus rhythm currently- prob convert to po tomorrow 2 sats good on RA, working well with incentive spirom 3 CXR stable in appearance, small air leak with cough- cont CT, place  to H2O seal, 270 cc drainage yesterday- serosanguinous 4 leukocytosis improved, no fevers 5 H/H , expected ABL anemia- a little lower 6 normal renal fxn 7 keep PCA a little longer with CT in place  8 routine pulm toilet and rehab 9 BS control is poor- on SSI and insulin pump- may need further adjustments  LOS: 2 days    John Giovanni PA-C 05/10/2019 ppager [phone]- not for patient use

## 2019-05-11 ENCOUNTER — Inpatient Hospital Stay (HOSPITAL_COMMUNITY): Payer: 59

## 2019-05-11 LAB — TSH: TSH: 1.022 u[IU]/mL (ref 0.350–4.500)

## 2019-05-11 LAB — T4, FREE: Free T4: 1.31 ng/dL — ABNORMAL HIGH (ref 0.61–1.12)

## 2019-05-11 MED ORDER — POLYETHYLENE GLYCOL 3350 17 G PO PACK
17.0000 g | PACK | Freq: Every day | ORAL | Status: DC | PRN
  Administered 2019-05-11: 17 g via ORAL
  Filled 2019-05-11: qty 1

## 2019-05-11 MED ORDER — AMIODARONE HCL 200 MG PO TABS
400.0000 mg | ORAL_TABLET | Freq: Two times a day (BID) | ORAL | Status: DC
  Administered 2019-05-11 – 2019-05-13 (×5): 400 mg via ORAL
  Filled 2019-05-11 (×6): qty 2

## 2019-05-11 NOTE — Progress Notes (Signed)
[address]       [place] [address]             [phone]      3 Days Post-Op Procedure(s) (LRB): Right VIDEO ASSISTED THORACOSCOPY with   MIDDLE LOBECTOMY and Enbloc portion of Upper Lobe with Node dissection, Intercostal Nerve Block (Right) Subjective: Looks and feels pretty well Remains in sinus rhythm  Objective: Vital signs in last 24 hours: Temp:  [98.1 F (36.7 C)-98.9 F (37.2 C)] 98.9 F (37.2 C) (12/06 0745) Pulse Rate:  [77-84] 84 (12/06 0745) Cardiac Rhythm: Normal sinus rhythm (12/06 0708) Resp:  [14-22] 19 (12/06 0745) BP: (113-136)/(63-87) 126/67 (12/06 0745) SpO2:  [97 %-100 %] 98 % (12/06 0745)  Hemodynamic parameters for last 24 hours:    Intake/Output from previous day: 12/05 0701 - 12/06 0700 In: 893.7 [P.O.:600; I.V.:293.7] Out: 2260 [Urine:1950; Chest Tube:310] Intake/Output this shift: Total I/O In: 240 [P.O.:240] Out: -   General appearance: alert, cooperative and no distress Heart: regular rate and rhythm Lungs: clear to auscultation bilaterally Abdomen: benign Extremities: no edema or calf tenderness Wound: incis healing well  Lab Results: Recent Labs    05/09/19 0325 05/10/19 0408  WBC 16.4* 11.4*  HGB 12.3* 11.4*  HCT 37.4* 34.4*  PLT 193 156   BMET:  Recent Labs    05/09/19 0325 05/10/19 0408  NA 134* 137  K 4.1 3.9  CL 100 105  CO2 24 27  GLUCOSE 296* 202*  BUN 23* 14  CREATININE 0.92 0.78  CALCIUM 8.3* 8.0*    PT/INR: No results for input(s): LABPROT, INR in the last 72 hours. ABG    Component Value Date/Time   PHART 7.445 05/06/2019 1324   HCO3 25.1 05/06/2019 1324   O2SAT 94.5 05/06/2019 1324   CBG (last 3)  Recent Labs    05/08/19 2129 05/09/19 0315 05/09/19 0632  GLUCAP 298* 307* 256*    Meds Scheduled Meds: . acetaminophen  1,000 mg Oral Q6H   Or  . acetaminophen (TYLENOL) oral liquid 160 mg/5 mL  1,000 mg Oral Q6H  . aspirin  81 mg Oral Daily  . bisacodyl  10 mg  Oral Daily  . Chlorhexidine Gluconate Cloth  6 each Topical Daily  . enoxaparin (LOVENOX) injection  40 mg Subcutaneous Daily  . HYDROmorphone   Intravenous Q4H  . insulin aspart  0-24 Units Subcutaneous TID AC & HS  . insulin pump   Subcutaneous TID WC, HS, 0200  . ketorolac  15 mg Intravenous Q6H  . lisinopril  5 mg Oral Daily  . pantoprazole  40 mg Oral Daily  . senna-docusate  1 tablet Oral QHS  . sertraline  100 mg Oral Daily   Continuous Infusions: . sodium chloride    . sodium chloride 10 mL/hr at 05/09/19 0843  . amiodarone 30 mg/hr (05/10/19 0900)   PRN Meds:.Place/Maintain arterial line **AND** sodium chloride, diphenhydrAMINE **OR** diphenhydrAMINE, ipratropium-albuterol, naloxone **AND** sodium chloride flush, ondansetron (ZOFRAN) IV, oxyCODONE  Xrays Dg Chest Port 1 View  Result Date: 05/10/2019 CLINICAL DATA:  Follow-up pneumothorax. EXAM: PORTABLE CHEST 1 VIEW COMPARISON:  05/09/2019 FINDINGS: Right IJ catheter tip projects over the SVC. Right-sided chest tube is in place. No appreciable pneumothorax identified. Stable postoperative changes involving the right lung. Right upper lobe opacity measuring 4.2 cm is unchanged from previous exam. Left lung clear. IMPRESSION: 1. Left chest tube in place, no pneumothorax identified. 2. No change in right upper lung opacity.  Electronically Signed   By: Kerby Moors M.D.   On: 05/10/2019 11:12    Assessment/Plan: S/P Procedure(s) (LRB): Right VIDEO ASSISTED THORACOSCOPY with   MIDDLE LOBECTOMY and Enbloc portion of Upper Lobe with Node dissection, Intercostal Nerve Block (Right)  1 doing well overall 2 hemodyn stable in sinus- will convert to po amio today, check TSH, T3, free T4 3 sats good on RA 4 CXR- stable appearance- no pntx 5 BS still poorly controlled- he does make changes to insulin pump with readings, continue SSI as well- he says much better controlled at home so probably related to surgical "stress", po intake of  carbs 6 CT- small air leak, 310 cc serosang drainage/24hr- will keep one more day on H2O seal 7 routine pulm toilet/rehab  LOS: 3 days    John Giovanni PA-C 05/11/2019 Pager [phone]- not for patient use

## 2019-05-12 ENCOUNTER — Inpatient Hospital Stay (HOSPITAL_COMMUNITY): Payer: 59

## 2019-05-12 LAB — CBC
HCT: 34.2 % — ABNORMAL LOW (ref 39.0–52.0)
Hemoglobin: 11.2 g/dL — ABNORMAL LOW (ref 13.0–17.0)
MCH: 30.9 pg (ref 26.0–34.0)
MCHC: 32.7 g/dL (ref 30.0–36.0)
MCV: 94.5 fL (ref 80.0–100.0)
Platelets: 169 10*3/uL (ref 150–400)
RBC: 3.62 MIL/uL — ABNORMAL LOW (ref 4.22–5.81)
RDW: 12.8 % (ref 11.5–15.5)
WBC: 9.4 10*3/uL (ref 4.0–10.5)
nRBC: 0 % (ref 0.0–0.2)

## 2019-05-12 LAB — T3: T3, Total: 74 ng/dL (ref 71–180)

## 2019-05-12 LAB — SURGICAL PATHOLOGY

## 2019-05-12 MED ORDER — SODIUM CHLORIDE 0.9% FLUSH
10.0000 mL | Freq: Two times a day (BID) | INTRAVENOUS | Status: DC
  Administered 2019-05-12 (×2): 10 mL

## 2019-05-12 MED ORDER — INSULIN DETEMIR 100 UNIT/ML ~~LOC~~ SOLN
15.0000 [IU] | Freq: Two times a day (BID) | SUBCUTANEOUS | Status: DC
  Filled 2019-05-12 (×2): qty 0.15

## 2019-05-12 MED ORDER — SODIUM CHLORIDE 0.9% FLUSH: 10.0000 mL | INTRAVENOUS | Status: DC | PRN

## 2019-05-12 NOTE — Progress Notes (Addendum)
4 Days Post-Op Procedure(s) (LRB): Right VIDEO ASSISTED THORACOSCOPY with   MIDDLE LOBECTOMY and Enbloc portion of Upper Lobe with Node dissection, Intercostal Nerve Block (Right) Subjective: Up in bedside chair. No new concerns. Having some pain aroud CT but says it is controlled.   Objective: Vital signs in last 24 hours: Temp:  [98.1 F (36.7 C)-99.3 F (37.4 C)] 98.9 F (37.2 C) (12/07 0736) Pulse Rate:  [75-90] 80 (12/07 0407) Cardiac Rhythm: Normal sinus rhythm (12/07 0700) Resp:  [12-20] 14 (12/07 0736) BP: (119-146)/(70-83) 127/77 (12/07 0736) SpO2:  [98 %-100 %] 100 % (12/07 0407)      Intake/Output from previous day: 12/06 0701 - 12/07 0700 In: 74 [P.O.:240; I.V.:540] Out: 1180 [Urine:800; Chest Tube:380] Intake/Output this shift: No intake/output data recorded.  General appearance: alert, cooperative and no distress Neurologic: intact Heart: SR, no further atrial fibrillation.  Lungs: breath sounds clear, no air leak from CT but contiues to have significant drainage. (370ml/24 hours) Wound: Incision is intact and dry.  Lab Results: Recent Labs    05/10/19 0408 05/12/19 0408  WBC 11.4* 9.4  HGB 11.4* 11.2*  HCT 34.4* 34.2*  PLT 156 169   BMET:  Recent Labs    05/10/19 0408  NA 137  K 3.9  CL 105  CO2 27  GLUCOSE 202*  BUN 14  CREATININE 0.78  CALCIUM 8.0*    PT/INR: No results for input(s): LABPROT, INR in the last 72 hours. ABG    Component Value Date/Time   PHART 7.445 05/06/2019 1324   HCO3 25.1 05/06/2019 1324   O2SAT 94.5 05/06/2019 1324   CBG (last 3)  No results for input(s): GLUCAP in the last 72 hours.  Assessment/Plan: S/P Procedure(s) (LRB): Right VIDEO ASSISTED THORACOSCOPY with   MIDDLE LOBECTOMY and Enbloc portion of Upper Lobe with Node dissection, Intercostal Nerve Block (Right)  -POD-4 right middle lobectomy with partial right upper lobectomy. No air leak, no PTX on water seal. Will leave CT for darinage for now.    -Post-op atrial fibrillation- holding SR on oral amiodarone.   -DVT PPX- Lovenox.   -Diabetes mellitus- Glucose 250-300. On insulin pump and SSI. Will consider asking endocrinologist to review.       LOS: 4 days    Antony Odea, [place] [phone] 05/12/2019 Patient seen and examined. No air leak- dc chest tube Home tomorrow if no additional A fib  Barbaraann Avans C. Roxan Hockey, MD [facility] [phone]

## 2019-05-12 NOTE — Progress Notes (Signed)
Inpatient Diabetes Program Recommendations  AACE/ADA: New Consensus Statement on Inpatient Glycemic Control  Target Ranges:  Prepandial:   less than 140 mg/dL      Peak postprandial:   less than 180 mg/dL (1-2 hours)      Critically ill patients:  140 - 180 mg/dL   Results for Jose Harvey, Jose Harvey (MRN [id number]) as of 05/12/2019 08:24  Ref. Range 05/11/2019 06:13 05/11/2019 11:30 05/11/2019 16:00 05/11/2019 17:00 05/12/2019 3:45 05/12/2019 7:14  Glucose-Capillary Latest Ref Range: 70 - 99 mg/dL 195  128 180 210 197 210   Review of Glycemic Control  Diabetes history: DM1 Outpatient Diabetes medications: Medtronic Insulin Pump   Current orders for Inpatient glycemic control: Insulin Pump ACHS&2am, Levemir 15 units BID  Inpatient Diabetes Program Recommendations:   Insulin Pump: Would recommend provider or patient call Dr. Buddy Duty to discuss glucose trends and see if he will provide recommendations for insulin pump setting adjustments to improve glycemic control.  Insulin - Basal: Noted Levemir 15 units BID ordered this morning. Please discontinue Levemir insulin.  NOTE: Inpatient Diabetes Coordinator spoke with patient on 05/09/19 and per note by S. Jannifer Franklin, RN on 05/09/19 (see note for insulin pump settings).     Thanks, Barnie Alderman, RN, MSN, CDE Diabetes Coordinator Inpatient Diabetes Program [phone] (Team Pager from 8am to 5pm)

## 2019-05-13 ENCOUNTER — Inpatient Hospital Stay (HOSPITAL_COMMUNITY): Payer: 59

## 2019-05-13 LAB — POCT I-STAT, CHEM 8
BUN: 26 mg/dL — ABNORMAL HIGH (ref 6–20)
Calcium, Ion: 1.21 mmol/L (ref 1.15–1.40)
Chloride: 102 mmol/L (ref 98–111)
Creatinine, Ser: 0.8 mg/dL (ref 0.61–1.24)
Glucose, Bld: 189 mg/dL — ABNORMAL HIGH (ref 70–99)
HCT: 41 % (ref 39.0–52.0)
Hemoglobin: 13.9 g/dL (ref 13.0–17.0)
Potassium: 4.5 mmol/L (ref 3.5–5.1)
Sodium: 136 mmol/L (ref 135–145)
TCO2: 26 mmol/L (ref 22–32)

## 2019-05-13 MED ORDER — AMIODARONE HCL 200 MG PO TABS: 200.0000 mg | ORAL_TABLET | Freq: Two times a day (BID) | ORAL | 1 refills | Status: DC

## 2019-05-13 MED ORDER — OXYCODONE-ACETAMINOPHEN 5-325 MG PO TABS: 1.0000 | ORAL_TABLET | ORAL | 0 refills | Status: AC | PRN

## 2019-05-13 NOTE — Plan of Care (Signed)
  Problem: Education: Goal: Knowledge of disease or condition will improve Outcome: Completed/Met Goal: Knowledge of the prescribed therapeutic regimen will improve Outcome: Completed/Met   Problem: Activity: Goal: Risk for activity intolerance will decrease Outcome: Completed/Met   Problem: Cardiac: Goal: Will achieve and/or maintain hemodynamic stability Outcome: Completed/Met   Problem: Clinical Measurements: Goal: Postoperative complications will be avoided or minimized Outcome: Completed/Met   Problem: Respiratory: Goal: Respiratory status will improve Outcome: Completed/Met   Problem: Pain Management: Goal: Pain level will decrease Outcome: Completed/Met   Problem: Skin Integrity: Goal: Wound healing without signs and symptoms infection will improve Outcome: Completed/Met   Problem: Education: Goal: Knowledge of General Education information will improve Description: Including pain rating scale, medication(s)/side effects and non-pharmacologic comfort measures Outcome: Completed/Met   Problem: Health Behavior/Discharge Planning: Goal: Ability to manage health-related needs will improve Outcome: Completed/Met   Problem: Clinical Measurements: Goal: Ability to maintain clinical measurements within normal limits will improve Outcome: Completed/Met Goal: Will remain free from infection Outcome: Completed/Met Goal: Diagnostic test results will improve Outcome: Completed/Met Goal: Respiratory complications will improve Outcome: Completed/Met Goal: Cardiovascular complication will be avoided Outcome: Completed/Met   Problem: Activity: Goal: Risk for activity intolerance will decrease Outcome: Completed/Met   Problem: Nutrition: Goal: Adequate nutrition will be maintained Outcome: Completed/Met   Problem: Coping: Goal: Level of anxiety will decrease Outcome: Completed/Met   Problem: Elimination: Goal: Will not experience complications related to bowel  motility Outcome: Completed/Met Goal: Will not experience complications related to urinary retention Outcome: Completed/Met   Problem: Pain Managment: Goal: General experience of comfort will improve Outcome: Completed/Met   Problem: Safety: Goal: Ability to remain free from injury will improve Outcome: Completed/Met   Problem: Skin Integrity: Goal: Risk for impaired skin integrity will decrease Outcome: Completed/Met  Discharge instructions reviewed with patient.  These include, but are not limited to, the following:  pain management, incision care, infection prevention, activity recommendations, driving restrictions, current medication list, pulmonary toiletry as recommended by surgeon, prevention of constipation, follow-up appointments, recommended dietary intake, when to call the MD / surgeon, etc.  Comprehension of information ascertained via "teach-back" method.

## 2019-05-13 NOTE — Progress Notes (Signed)
Patient discharged to private residence via automobile accompanied by sister.  Escorted to exit via wheelchair by nurse tech.

## 2019-05-13 NOTE — Progress Notes (Addendum)
Inpatient Diabetes Program Recommendations  AACE/ADA: New Consensus Statement on Inpatient Glycemic Control   Target Ranges:  Prepandial:   less than 140 mg/dL      Peak postprandial:   less than 180 mg/dL (1-2 hours)      Critically ill patients:  140 - 180 mg/dL    Ref. Range 05/12/2019 07:14 05/12/2019 12:00 05/12/2019 18:45 05/13/2019 2:30 05/13/2019 6:10  Glucose-Capillary Latest Ref Range: 70 - 99 mg/dL 210  7.2 units via pump    8.1 units via pump 258  8.4 units via pump  223  3.4 units via pump   193  3.5 units via pump      Review of Glycemic Control  Diabetes history: DM1 Outpatient Diabetes medications: Medtronic Insulin Pump   Current orders for Inpatient glycemic control: Insulin Pump ACHS&2am, Levemir 15 units BID  Inpatient Diabetes Program Recommendations:   Insulin Pump: Would recommend provider or patient call Dr. Buddy Duty to discuss glucose trends and see if he will provide recommendations for insulin pump setting adjustments to improve glycemic control.  Addendum 05/13/19@13 :55-Went by to talk with patient about hyperglycemia but patient was discharged home already. Called patient on his cell phone to discuss noted hyperglycemia. Patient reports that he has been making some adjustments himself with his insulin pump settings to try to improve glycemic control. Patient reports that he does not usually have issues with hyperglycemia unless he is taking steroids. Explained that stress from the surgery is contributing to hyperglycemia. Discussed importance of good glycemic control following surgery to help decrease risk of complications. Encouraged patient to reach out to Dr. Buddy Duty if glucose continues to be consistently over 180 mg/dl so he can receive advice from Dr. Buddy Duty about what changes to make with his insulin pump settings. Patient expressed appreciation for the call and verbalized that he will plan to reach out to Dr. Buddy Duty if glucose remains elevated.    Thanks, Jose Alderman, RN, MSN, CDE Diabetes Coordinator Inpatient Diabetes Program [phone] (Team Pager from 8am to 5pm)

## 2019-05-13 NOTE — Discharge Instructions (Signed)
Lung Mass  A lung mass is a growth in the lung that is larger than 3 centimeters (1.2 inches). Smaller growths are called nodules. Most lung nodules are not cancer. Lung masses have a higher risk of being cancer. A lung mass is sometimes found during a routine chest X-ray or while doing other imaging tests to check for other problems. What are common types of lung masses? Lung masses include:  Tumors. These may be cancerous (malignant) or noncancerous (benign).  Infectious masses (granulomas). These are masses caused by inflammation from bacterial infections, like tuberculosis, or fungal infections.  Noninfectious masses. Some diseases that cause lung inflammation may also cause lung masses to form.  Blood vessel malformations. What type of testing may be needed? Your health care provider may recommend that you have tests to diagnose the cause of your lung mass. The following tests may be done if a lung mass is found:  Physical exam.  Imaging tests, such as: ? Chest X-rays. ? CT scan. ? PET scan. This scan measures how much energy a mass is using.  Biopsy to rule out cancer or confirm a diagnosis. This procedure involves removing a tissue sample from the mass with a needle inserted through the chest, using a scope placed down into the lung, or through open surgery. Tests and physical exams may be done once, or they may be done regularly for a period of time. Tests and exams that are done regularly will help monitor whether the mass or tissue change is growing and becoming a concern. What are common treatments? Treatment for a lung mass depends on the cause. Noncancerous masses may require treatment specific to the cause. Treatment options for a cancerous lung mass may include:  Surgical removal.  Radiation therapy.  Chemotherapy. Follow these instructions at home: If you have had surgery or a biopsy, your health care provider will give you specific instructions for taking care  of yourself at home after your procedure. Follow these instructions carefully. General home care instructions include:  Take over-the-counter and prescription medicines only as told by your health care provider.  Return to your normal activities as told by your health care provider. Ask your health care provider what activities are safe for you.  Do not use any products that contain nicotine or tobacco, such as cigarettes, e-cigarettes, and chewing tobacco. If you need help quitting, ask your health care provider.  Keep all follow-up visits as told by your health care provider. This is important. Contact a health care provider if you:  Have pain in your chest, back, or shoulder.  Are short of breath.  Have a cough.  Cough up blood or bloody sputum. Summary  A lung mass is a growth in the lung that is larger than 3 centimeters (1.2 inches).  Lung masses have a higher risk of being cancer than do smaller growths.  Sometimes a lung mass is found during a routine chest X-ray or other imaging test.  Your health care provider may do a biopsy of the lung mass to rule out or confirm cancer.  Treatment for this condition depends on the cause. This information is not intended to replace advice given to you by your health care provider. Make sure you discuss any questions you have with your health care provider. Document Released: 01/15/2018 Document Revised: 09/13/2018 Document Reviewed: 01/15/2018 Elsevier Patient Education  [place]. Discharge Instructions:  1. You may shower, please wash incisions daily with soap and water and keep dry.  If you wish to cover wounds with dressing you may do so but please keep clean and change daily.  No tub baths or swimming until incisions have completely healed.  If your incisions become red or develop any drainage please call our office at [phone]  2. No Driving until cleared by [facility] office and you are no longer using narcotic  pain medications  3. Monitor your weight daily. Please use the same scale and weigh at same time... If you gain 5-10 lbs in 48 hours with associated lower extremity swelling, please contact our office at [phone]  4. Fever of 101.5 for at least 24 hours with no source, please contact our office at [phone]  5. Activity- up as tolerated, please walk at least 3 times per day.  Avoid strenuous activity, no lifting, pushing, or pulling with your arms over 8-10 lbs for a minimum of 6 weeks  6. If any questions or concerns arise, please do not hesitate to contact our office at [phone]

## 2019-05-13 NOTE — Progress Notes (Addendum)
5 Days Post-Op Procedure(s) (LRB): Right VIDEO ASSISTED THORACOSCOPY with   MIDDLE LOBECTOMY and Enbloc portion of Upper Lobe with Node dissection, Intercostal Nerve Block (Right) Subjective: Much more comfortable after CT removed yesterday. Denies shortness of breath.   Objective: Vital signs in last 24 hours: Temp:  [98 F (36.7 C)-98.9 F (37.2 C)] 98.7 F (37.1 C) (12/08 0300) Pulse Rate:  [75-82] 82 (12/08 0300) Cardiac Rhythm: Normal sinus rhythm (12/08 0300) Resp:  [11-22] 14 (12/08 0300) BP: (114-136)/(62-75) 134/75 (12/08 0300) SpO2:  [96 %-98 %] 97 % (12/08 0300)     Intake/Output from previous day: 12/07 0701 - 12/08 0700 In: 240 [P.O.:240] Out: 100 [Chest Tube:100] Intake/Output this shift: No intake/output data recorded.  Physical Exam: General appearance: alert, cooperative and no distress Neurologic: intact Heart: SR Lungs: breath sounds clear, incisions bruised but intact and dry.  CXR OK  Lab Results: Recent Labs    05/12/19 0408  WBC 9.4  HGB 11.2*  HCT 34.2*  PLT 169   BMET: No results for input(s): NA, K, CL, CO2, GLUCOSE, BUN, CREATININE, CALCIUM in the last 72 hours.  PT/INR: No results for input(s): LABPROT, INR in the last 72 hours. ABG    Component Value Date/Time   PHART 7.445 05/06/2019 1324   HCO3 25.1 05/06/2019 1324   O2SAT 94.5 05/06/2019 1324   CBG (last 3)  No results for input(s): GLUCAP in the last 72 hours.  Assessment/Plan: S/P Procedure(s) (LRB): Right VIDEO ASSISTED THORACOSCOPY with   MIDDLE LOBECTOMY and Enbloc portion of Upper Lobe with Node dissection, Intercostal Nerve Block (Right)  -POD-5 right middle lobectomy with partial right upper lobectomy. No air leak, no PTX on water seal. CXR stable after removal of the chest tube yesterday.  Discharge to home today. Appointment made for office follow up with CXR in 2 weeks.   -Post-op atrial fibrillation- holding SR on oral amiodarone. Continue at discharge 200mg  po  BID.   -Diabetes mellitus- to continue with his insulin pump as prior to admission.    LOS: 5 days    Malon Kindle [id number] 05/13/2019 Patient seen and examined, agree with above Dc home Will arrange Onc consult as outpatient  Remo Lipps C. Roxan Hockey, MD [facility] [phone]

## 2019-05-15 LAB — TYPE AND SCREEN
ABO/RH(D): A NEG
Antibody Screen: NEGATIVE
Unit division: 0
Unit division: 0
Unit division: 0
Unit division: 0

## 2019-05-15 LAB — BPAM RBC
Blood Product Expiration Date: 202012202359
Blood Product Expiration Date: 202012202359
Blood Product Expiration Date: 202012222359
Blood Product Expiration Date: 202012222359
ISSUE DATE / TIME: 202012030743
ISSUE DATE / TIME: 202012030743
Unit Type and Rh: 600
Unit Type and Rh: 600
Unit Type and Rh: 600
Unit Type and Rh: 600

## 2019-05-16 ENCOUNTER — Other Ambulatory Visit: Payer: Self-pay

## 2019-05-16 ENCOUNTER — Ambulatory Visit (INDEPENDENT_AMBULATORY_CARE_PROVIDER_SITE_OTHER): Payer: Self-pay | Admitting: Physician Assistant

## 2019-05-16 ENCOUNTER — Encounter: Payer: Self-pay | Admitting: Thoracic Surgery (Cardiothoracic Vascular Surgery)

## 2019-05-16 VITALS — BP 104/69 | HR 96 | Temp 97.8°F | Resp 20 | Ht 71.0 in | Wt 180.0 lb

## 2019-05-16 DIAGNOSIS — Z09 Encounter for follow-up examination after completed treatment for conditions other than malignant neoplasm: Secondary | ICD-10-CM

## 2019-05-16 DIAGNOSIS — R918 Other nonspecific abnormal finding of lung field: Secondary | ICD-10-CM

## 2019-05-16 NOTE — Patient Instructions (Signed)
Keep the incisions clean and dry. Re-dress as needed if any further drainage occurs.   Follow up as scheduled on 12/22 and as needed.

## 2019-05-16 NOTE — Progress Notes (Signed)
  HPI: Jose Harvey was discharged from the hospital 3 days ago after undergoing right VATS, right middle lobectomy , and lymph node dissection for MALT lymphoma. His post-operative course was uneventful except for a brief episode of atrial fibrillation managed with amiodarone resulting in conversion to SR prior to discharge.    He contacted the office for evaluation of the chest tube insertion site after it became inflamed and developed some crusty drainage along the skin edges. .   Denies fever or pain. He has not required the analgesics prescribed at [place]. He has not had any further palpitations.    Current Outpatient Medications  Medication Sig Dispense Refill  . amiodarone (PACERONE) 200 MG tablet Take 1 tablet (200 mg total) by mouth 2 (two) times daily. 60 tablet 1  . aspirin 81 MG tablet Take 1 tablet (81 mg total) by mouth daily. 30 tablet   . cetirizine (KLS ALLER-TEC) 10 MG tablet Take 5 mg by mouth daily.    . Coenzyme Q10 (COQ10) 100 MG CAPS Take 100 mg by mouth daily.    . Glucos-Chond-Hyal Ac-Ca Fructo (MOVE FREE JOINT HEALTH ADVANCE PO) Take 1 tablet by mouth daily.    . Insulin Human (INSULIN PUMP) SOLN Inject into the skin continuous. NOVOLOG 100 UNIT/ML VIAL Basal rate per patient is as follows: at 12 midnite- .6 , at 0500am-.8 then at 0800am-.6    . lisinopril (PRINIVIL,ZESTRIL) 5 MG tablet Take 5 mg by mouth daily.     . Multiple Vitamins-Minerals (ICAPS AREDS 2) CAPS Take 1 capsule by mouth daily.    Marland Kitchen oxyCODONE-acetaminophen (PERCOCET) 5-325 MG tablet Take 1 tablet by mouth every 4 (four) hours as needed for up to 5 days for severe pain. 20 tablet 0  . pantoprazole (PROTONIX) 40 MG tablet Take 40 mg by mouth daily.    . rosuvastatin (CRESTOR) 5 MG tablet Take 1 tablet (5 mg total) by mouth daily. 90 tablet 3  . sertraline (ZOLOFT) 100 MG tablet Take 100 mg by mouth daily.   0   No current facility-administered medications for this visit.    Physical  Exam VS: T  97.51f BP 104/68 P 96 RR 20 O2 sat 95%  Heart:RRR  Chest: Breath sound clear.  Wounds:  The thoracotomy incision is well approximated and dry with expected bruising in the surrounding tissues. The smaller counter incision is also intact and dry. The CT insertion site has some mild inflammation and crusty, non-purulent drainage along the edges. This does not appear infected.    Impression / Plan: Suspect silk stitch skin irritation at right chest tube exit site. No convincing sign of infection. Stitch removed. The wound was cleaned with betadine and a dry sterile dressing was applied.  I asked him to keep the wound clean and dry and re-dress if any further drainage occurs.  He has a follow up appointment already scheduled in about 10 days but I asked him to let us know if the wound does not continue to improve.  Antony Odea, PA-C [facility] [phone]

## 2019-05-21 ENCOUNTER — Other Ambulatory Visit: Payer: Self-pay | Admitting: Thoracic Surgery (Cardiothoracic Vascular Surgery)

## 2019-05-21 DIAGNOSIS — Z9889 Other specified postprocedural states: Secondary | ICD-10-CM

## 2019-05-22 ENCOUNTER — Telehealth: Payer: Self-pay | Admitting: *Deleted

## 2019-05-22 DIAGNOSIS — C8522 Mediastinal (thymic) large B-cell lymphoma, intrathoracic lymph nodes: Secondary | ICD-10-CM

## 2019-05-22 NOTE — Telephone Encounter (Signed)
Oncology Nurse Navigator Documentation  Oncology Nurse Navigator Flowsheets 05/22/2019  Abnormal Finding Date -  Confirmed Diagnosis Date -  Expected Surgery Date -  Navigator Location [facility]  Referral Date to RadOnc/MedOnc 05/22/2019  Navigator Encounter Type Telephone/I followed up with Dr. Roxan Hockey regarding Jose Harvey.  He would like patient to be seen with med onc.  I called patient to update him that he would be getting a call from the cancer center about an appt. He verbalized understanding   Telephone Outgoing Call  Treatment Phase -  Barriers/Navigation Needs Coordination of Care;Education  Education Other  Interventions Coordination of Care;Education  Acuity Level 2-Minimal Needs (1-2 Barriers Identified)  Coordination of Care Other  Education Method Verbal  Time Spent with Patient 30

## 2019-05-23 ENCOUNTER — Telehealth: Payer: Self-pay | Admitting: Hematology

## 2019-05-23 NOTE — Telephone Encounter (Signed)
Received a new pt referral from Dr. Roxan Hockey for dx of MALT lymphoma. Jose Harvey has been cld and scheduled to see Dr. Irene Limbo on 12/21 at 1pm. Pt aware to arrive 15 minutes early.

## 2019-05-26 ENCOUNTER — Other Ambulatory Visit: Payer: Self-pay

## 2019-05-26 ENCOUNTER — Inpatient Hospital Stay: Payer: 59 | Attending: Hematology | Admitting: Hematology

## 2019-05-26 VITALS — BP 120/71 | HR 70 | Temp 98.0°F | Resp 17 | Ht 71.0 in | Wt 173.0 lb

## 2019-05-26 DIAGNOSIS — Z833 Family history of diabetes mellitus: Secondary | ICD-10-CM

## 2019-05-26 DIAGNOSIS — D3502 Benign neoplasm of left adrenal gland: Secondary | ICD-10-CM

## 2019-05-26 DIAGNOSIS — Z87891 Personal history of nicotine dependence: Secondary | ICD-10-CM | POA: Insufficient documentation

## 2019-05-26 DIAGNOSIS — I700 Atherosclerosis of aorta: Secondary | ICD-10-CM | POA: Diagnosis not present

## 2019-05-26 DIAGNOSIS — J449 Chronic obstructive pulmonary disease, unspecified: Secondary | ICD-10-CM | POA: Diagnosis not present

## 2019-05-26 DIAGNOSIS — D1809 Hemangioma of other sites: Secondary | ICD-10-CM | POA: Insufficient documentation

## 2019-05-26 DIAGNOSIS — R0602 Shortness of breath: Secondary | ICD-10-CM

## 2019-05-26 DIAGNOSIS — Z8249 Family history of ischemic heart disease and other diseases of the circulatory system: Secondary | ICD-10-CM

## 2019-05-26 DIAGNOSIS — Z79899 Other long term (current) drug therapy: Secondary | ICD-10-CM | POA: Diagnosis not present

## 2019-05-26 DIAGNOSIS — Z7289 Other problems related to lifestyle: Secondary | ICD-10-CM | POA: Insufficient documentation

## 2019-05-26 DIAGNOSIS — Z836 Family history of other diseases of the respiratory system: Secondary | ICD-10-CM

## 2019-05-26 DIAGNOSIS — E109 Type 1 diabetes mellitus without complications: Secondary | ICD-10-CM

## 2019-05-26 DIAGNOSIS — C884 Extranodal marginal zone B-cell lymphoma of mucosa-associated lymphoid tissue [MALT-lymphoma]: Secondary | ICD-10-CM | POA: Insufficient documentation

## 2019-05-26 DIAGNOSIS — Z8042 Family history of malignant neoplasm of prostate: Secondary | ICD-10-CM | POA: Insufficient documentation

## 2019-05-26 DIAGNOSIS — Z8546 Personal history of malignant neoplasm of prostate: Secondary | ICD-10-CM | POA: Diagnosis not present

## 2019-05-26 NOTE — Progress Notes (Signed)
HEMATOLOGY/ONCOLOGY CONSULTATION NOTE  Date of Service: 05/26/2019  Patient Care Team: Leeroy Cha, MD as PCP - General (Internal Medicine)  CHIEF COMPLAINTS/PURPOSE OF CONSULTATION:  MALT Lymphoma  HISTORY OF PRESENTING ILLNESS:   Jose Harvey is a wonderful 58 y.o. 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 Pt is accompanied today by his sister. The pt reports that he is doing well overall.  The pt reports that he was a long-term smoker and due to that he has been a participant in a lung cancer screening program. While doing routine monitoring small lung nodules were first noticed in 2017. They did not feel that action needed to be taken at that time so they simply continued monitoring him. A significant change was noticed within the last 6 months. Pt denies any fevers, chills, night sweats, SOB during this time. Pt had a Thorascopy with Middle Lobectomy with Dr. Koleen Nimrod on12/08/2018. He currently feels well and denies any of the aforementioned symptoms except an increase in SOB, noticed after surgery.   Pt quit smoking about 10 years ago. He was diagnosed with COPD in 2018 and given an inhaler. He did not notice a difference with the inhaler so he has since stopped using it. Pt has not had any issues with vertigo in sometime and his diabetes has been well controlled. Pt has a history of Prostate Cancer and had it resected in January of 2019. Pt also had a cholecystectomy, which they decided on due to activity seen on 2017 PET scan. He denies any issues resulting from his gallbladder removal. Diabetes and heart disease runs in his family. His father had prostate cancer and heart disease as well.   Pt will return to work in Spring. There is dust and floating debris at his job but he is not around it very often. Pt has stayed up to date with his annual flu vaccine as well as his pneumonia vaccines.   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 Masslike area of ground-glass straddles the minor fissure with septal thickening and fissural retraction. While the lesion has increased in size minimally from 09/30/2018, there is clear enlargement from baseline examination on 02/16/2016. Therefore, lesion is characterized as worrisome for adenocarcinoma, Lung-RADS 4B, suspicious. Additional imaging evaluation or consultation with Pulmonology or Thoracic Surgery recommended. These results will be called to the ordering clinician or representative by the Radiologist Assistant, and communication documented in the PACS or zVision Dashboard. 2. Liver hemangiomas and left adrenal adenoma, best characterized on 03/03/2016. 3. Aortic atherosclerosis (ICD10-170.0). Coronary artery calcification."  Pt has had PET/CT ([id number]) completed on 02/25/2016 with results revealing "Dominant 8 mm right lower lobe pulmonary nodule shows no significant FDG uptake. Other scattered less than 5 mm bilateral pulmonary nodules also show no FDG uptake but are too small to characterize by PET. Recommend followup by chest CT in 6 months. (please use the following order, "CT CHEST LCS NODULE FOLLOW-UP W/O CM") Right middle lobe ground-glass opacity shows low-grade FDG uptake, and differential diagnosis includes inflammatory or infectious processes and low-grade adenocarcinoma. Recommend followup by chest CT in 6 months.  Multiple low-attenuation liver masses  show no hypermetabolic activity compared to normal hepatic parenchyma. These remain indeterminate and cannot be characterized without IV contrast.  Abdomen MRI without and with contrast recommended for further evaluation. 2.5 cm left adrenal mass shows mild FDG uptake, and remains indeterminate. Differential diagnosis includes atypical adenoma, metastasis, pheochromocytoma, or less likely adrenal cortical carcinoma. This can also be further evaluated with abdomen MRI without and with contrast."  Most recent lab results (05/12/2019) of CBC is as follows: all values are WNL except for RBC at 3.62, Hgb at 11.2, HCT at 34.2.  On review of systems, pt reports increased SOB and denies testicular pain/swelling, leg swelling, fevers, chills, night sweats and any other symptoms.   On PMHx the pt reports COPD with emphysema, Benign Positional Vertigo, Diabetes, Prostate Cancer, Prostatectomy (2019), Cholecystectomy. On Social Hx the pt reports he is a former smoker who quit 10 years ago On Family Hx the pt reports that his father had Prostate Cancer and Heart Disease, additional family history of Heart Disease and Diabetes.   MEDICAL HISTORY:  Past Medical History:  Diagnosis Date  . Adrenal adenoma, left 03/2017   noted on CT  . Allergic rhinitis, cause unspecified   . Anxiety state, unspecified   . Aortic atherosclerosis ([place]) 03/2017   noted on CT  . BPV (benign positional vertigo)   . COPD (chronic obstructive pulmonary disease) with emphysema (HCC)    mild followed by [facility]   . Coronary artery disease   . Depressive disorder, not elsewhere classified   . Dyspnea    history of  . ED (erectile dysfunction)   . Gallbladder polyp 03/2016   noted on CT  . Hepatic hemangioma 03/2017   noted on CT  . History of kidney stones    hx of years ago   . Indigestion   . 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 Mixed hyperlipidemia   . Prostate cancer ([place])   . 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 Type I (juvenile type) diabetes mellitus  without mention of complication, not stated as uncontrolled    type I - followed by Dr Buddy Duty   . Vesicoureteral reflux     SURGICAL HISTORY: Past Surgical History:  Procedure Laterality Date  . CHOLECYSTECTOMY N/A 05/17/2016   Procedure: LAPAROSCOPIC CHOLECYSTECTOMY;  Surgeon: Clovis Riley, MD;  Location: [facility];  Service: General;  Laterality: N/A;  . cyst removed from throat     . dental surgeries     . LYMPHADENECTOMY Bilateral 07/02/2017   Procedure: BILATERAL LYMPHADENECTOMY;  Surgeon: Lucas Mallow, MD;  Location: [facility];  Service: Urology;  Laterality: Bilateral;  . PROSTATE BIOPSY    . ROBOT ASSISTED LAPAROSCOPIC RADICAL PROSTATECTOMY N/A 07/02/2017   Procedure: XI ROBOTIC ASSISTED LAPAROSCOPIC RADICAL PROSTATECTOMY;  Surgeon: Lucas Mallow, MD;  Location: [facility];  Service: Urology;  Laterality: N/A;  . TONSILLECTOMY    . VIDEO ASSISTED THORACOSCOPY (VATS)/ LOBECTOMY Right 05/08/2019   Procedure: Right VIDEO ASSISTED THORACOSCOPY with   MIDDLE LOBECTOMY and Enbloc portion of Upper Lobe with Node dissection, Intercostal Nerve Block;  Surgeon: Melrose Nakayama, MD;  Location: [facility];  Service: Thoracic;  Laterality: Right;  . WISDOM TOOTH EXTRACTION      SOCIAL HISTORY: Social History   Socioeconomic History  . Marital status: Divorced    Spouse name: Not on file  . Number of children: 2  . Years of education: Not on  file  . 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 Smoking status: Former Smoker    Packs/day: 1.50    Years: 27.00    Pack years: 40.50    Types: Cigarettes    Quit date: 06/05/2008    Years since quitting: 10.9  . Smokeless tobacco: Former Systems developer    Types: Snuff    Quit date: 2015  Substance and Sexual Activity  . Alcohol use: Yes    Alcohol/week: 6.0 standard drinks    Types: 6 Cans of beer per week    Comment: occasional 6pk per week  . Drug use: No  . Sexual activity: Not  Currently  Other Topics Concern  . Not on file  Social History Narrative   Patient divorced. Patient has two grown daughters but neither lives locally.   Social Determinants of Health   Financial Resource Strain:   . Difficulty of Paying Living Expenses: Not on file  Food Insecurity:   . Worried About Charity fundraiser in the Last Year: Not on file  . Ran Out of Food in the Last Year: Not on file  Transportation Needs:   . Lack of Transportation (Medical): Not on file  . Lack of Transportation (Non-Medical): Not on file  Physical Activity:   . Days of Exercise per Week: Not on file  . Minutes of Exercise per Session: Not on file  Stress:   . Feeling of Stress : Not on file  Social Connections:   . Frequency of Communication with Friends and Family: Not on file  . Frequency of Social Gatherings with Friends and Family: Not on file  . Attends Religious Services: Not on file  . Active Member of Clubs or Organizations: Not on file  . Attends Archivist Meetings: Not on file  . Marital Status: Not on file  Intimate Partner Violence:   . Fear of Current or Ex-Partner: Not on file  . Emotionally Abused: Not on file  . Physically Abused: Not on file  . Sexually Abused: Not on file    FAMILY HISTORY: Family History  Problem Relation Age of Onset  . Diabetes type I Father   . Heart attack Father   . Hypertension Father   . Emphysema Father   . CAD Father   . Colon cancer Maternal Grandfather   . Emphysema Paternal Grandfather   . Diabetes type I Paternal Grandfather   . Kidney disease Neg Hx   . Liver disease Neg Hx   . Breast cancer Neg Hx   . Prostate cancer Neg Hx     ALLERGIES:  is allergic to crestor [rosuvastatin]; lipitor [atorvastatin]; other; and zocor [simvastatin].  MEDICATIONS:  Current Outpatient Medications  Medication Sig Dispense Refill  . amiodarone (PACERONE) 200 MG tablet Take 1 tablet (200 mg total) by mouth 2 (two) times daily. 60 tablet  1  . aspirin 81 MG tablet Take 1 tablet (81 mg total) by mouth daily. 30 tablet   . cetirizine (KLS ALLER-TEC) 10 MG tablet Take 5 mg by mouth daily.    . Coenzyme Q10 (COQ10) 100 MG CAPS Take 100 mg by mouth daily.    . Glucos-Chond-Hyal Ac-Ca Fructo (MOVE FREE JOINT HEALTH ADVANCE PO) Take 1 tablet by mouth daily.    . Insulin Human (INSULIN PUMP) SOLN Inject into the skin continuous. NOVOLOG 100 UNIT/ML VIAL Basal rate per patient is as follows: at 12 midnite- .6 ,  at 0500am-.8 then at 0800am-.6    . lisinopril (PRINIVIL,ZESTRIL) 5 MG tablet Take 5 mg by mouth daily.     . Multiple Vitamins-Minerals (ICAPS AREDS 2) CAPS Take 1 capsule by mouth daily.    . pantoprazole (PROTONIX) 40 MG tablet Take 40 mg by mouth daily.    . rosuvastatin (CRESTOR) 5 MG tablet Take 1 tablet (5 mg total) by mouth daily. 90 tablet 3  . sertraline (ZOLOFT) 100 MG tablet Take 100 mg by mouth daily.   0   No current facility-administered medications for this visit.    REVIEW OF SYSTEMS:    10 Point review of Systems was done is negative except as noted above.  PHYSICAL EXAMINATION: ECOG PERFORMANCE STATUS: 0 - Asymptomatic  .There were no vitals filed for this visit. There were no vitals filed for this visit. .There is no height or weight on file to calculate BMI.  GENERAL:alert, in no acute distress and comfortable SKIN: no acute rashes, no significant lesions EYES: conjunctiva are pink and non-injected, sclera anicteric OROPHARYNX: MMM, no exudates, no oropharyngeal erythema or ulceration NECK: supple, no JVD LYMPH:  no palpable lymphadenopathy in the cervical, axillary or inguinal regions LUNGS: clear to auscultation b/l with normal respiratory effort HEART: regular rate & rhythm ABDOMEN:  normoactive bowel sounds , non tender, not distended. Extremity: no pedal edema PSYCH: alert & oriented x 3 with fluent speech NEURO: no focal motor/sensory deficits  LABORATORY DATA:  I have reviewed the  data as listed  . CBC Latest Ref Rng & Units 05/12/2019 05/10/2019 05/09/2019  WBC 4.0 - 10.5 K/uL 9.4 11.4(H) 16.4(H)  Hemoglobin 13.0 - 17.0 g/dL 11.2(L) 11.4(L) 12.3(L)  Hematocrit 39.0 - 52.0 % 34.2(L) 34.4(L) 37.4(L)  Platelets 150 - 400 K/uL 169 156 193    . CMP Latest Ref Rng & Units 05/10/2019 05/09/2019 05/08/2019  Glucose 70 - 99 mg/dL 202(H) 296(H) 189(H)  BUN 6 - 20 mg/dL 14 23(H) 26(H)  Creatinine 0.61 - 1.24 mg/dL 0.78 0.92 0.80  Sodium 135 - 145 mmol/L 137 134(L) 136  Potassium 3.5 - 5.1 mmol/L 3.9 4.1 4.5  Chloride 98 - 111 mmol/L 105 100 102  CO2 22 - 32 mmol/L 27 24 -  Calcium 8.9 - 10.3 mg/dL 8.0(L) 8.3(L) -  Total Protein 6.5 - 8.1 g/dL 5.1(L) - -  Total Bilirubin 0.3 - 1.2 mg/dL 0.7 - -  Alkaline Phos 38 - 126 U/L 55 - -  AST 15 - 41 U/L 19 - -  ALT 0 - 44 U/L 19 - -   05/08/2019 Surgical Pathology [id number]):    05/08/2019 Surgical Pathology [phone]    RADIOGRAPHIC STUDIES: I have personally reviewed the radiological images as listed and agreed with the findings in the report. DG Chest 2 View  Result Date: 05/06/2019 CLINICAL DATA:  Preop VATS. Pulmonary nodule. EXAM: CHEST - 2 VIEW COMPARISON:  Chest CT 04/08/2019 FINDINGS: Area of mass-like ground-glass attenuation in the right mid lung on CT is only faintly visualized radiographically. The left lung is clear. Heart is normal in size with normal mediastinal contours. No pulmonary edema or pleural effusion. No pneumothorax. Degenerative change in the spine. IMPRESSION: Ground-glass area attenuation in the right mid lung on CT is only faintly visualized radiographically. No acute abnormality is seen. Electronically Signed   By: Keith Rake M.D.   On: 05/06/2019 20:51   DG CHEST PORT 1 VIEW  Result Date: 05/13/2019 CLINICAL DATA:  Chest tube removed post lung surgery EXAM:  PORTABLE CHEST 1 VIEW COMPARISON:  05/12/2019 FINDINGS: Similar size of small right apical pneumothorax. Similar  appearance of right upper lobe opacity. No pleural effusion. Normal heart size. Right IJ line is unchanged. IMPRESSION: Similar size of small right apical pneumothorax and similar right upper lobe opacity. No new findings. Electronically Signed   By: Macy Mis M.D.   On: 05/13/2019 08:22   DG CHEST PORT 1 VIEW  Result Date: 05/12/2019 CLINICAL DATA:  Right chest tube removal EXAM: PORTABLE CHEST 1 VIEW COMPARISON:  Same day FINDINGS: Right chest tube is been removed. Tiny amount of pleural air evident at the apex. Right internal jugular central line remains in place. Postoperative change with mild parenchymal and pleural density in the right midlung unchanged. Left chest remains clear. IMPRESSION: Tiny amount of pleural air at the right apex following right chest tube removal. Otherwise no change. Electronically Signed   By: Nelson Chimes M.D.   On: 05/12/2019 13:22   DG Chest Port 1 View  Result Date: 05/12/2019 CLINICAL DATA:  58 year old male postoperative day 4 right side VATS, middle lobectomy with en bloc resection of a portion of the right upper lobe 4 atypical lymphoid proliferation. EXAM: PORTABLE CHEST 1 VIEW COMPARISON:  05/11/2019 portable chest and earlier. FINDINGS: Portable AP semi upright view at 0846 hours. Stable right chest tube terminating in the apex. No pneumothorax identified. Stable hazy but confluent opacity in the medial upper lobe abutting the major fissure. Stable right IJ central line and mediastinal contours. Allowing for portable technique the lungs elsewhere remain clear. IMPRESSION: Stable right chest tube and postoperative appearance of the chest. No pneumothorax or adverse features identified. Electronically Signed   By: Genevie Ann M.D.   On: 05/12/2019 09:01   DG CHEST PORT 1 VIEW  Result Date: 05/11/2019 CLINICAL DATA:  Postop EXAM: PORTABLE CHEST 1 VIEW COMPARISON:  Portable exam 0558 hours compared to 05/30/2019 FINDINGS: RIGHT thoracostomy tube stable. RIGHT  jugular central venous catheter with tip projecting over SVC. Normal heart size, mediastinal contours, and pulmonary vascularity. RIGHT perihilar staple line with atelectasis at RIGHT base. Ovoid opacity again identified in RIGHT upper lobe suspected adjacent to minor fissure, stable. Underlying emphysematous changes. No pleural effusion or pneumothorax. IMPRESSION: Stable postoperative changes RIGHT lung. RIGHT thoracostomy tube without pneumothorax. Electronically Signed   By: Lavonia Dana M.D.   On: 05/11/2019 13:59   DG CHEST PORT 1 VIEW  Result Date: 05/10/2019 CLINICAL DATA:  Follow-up pneumothorax. EXAM: PORTABLE CHEST 1 VIEW COMPARISON:  05/09/2019 FINDINGS: Right IJ catheter tip projects over the SVC. Right-sided chest tube is in place. No appreciable pneumothorax identified. Stable postoperative changes involving the right lung. Right upper lobe opacity measuring 4.2 cm is unchanged from previous exam. Left lung clear. IMPRESSION: 1. Left chest tube in place, no pneumothorax identified. 2. No change in right upper lung opacity. Electronically Signed   By: Kerby Moors M.D.   On: 05/10/2019 11:12   DG CHEST PORT 1 VIEW  Result Date: 05/09/2019 CLINICAL DATA:  Postop VATS procedure and middle lobe lobectomy and partial right upper lobe lobectomy. EXAM: PORTABLE CHEST 1 VIEW COMPARISON:  05/08/2019 FINDINGS: The right chest tube is stable. No definite pneumothorax. The right IJ catheter is in good position, unchanged. The heart is normal in size. The mediastinal and hilar contours are normal. Stable surgical changes involving the right hilar area with staple lines. Small postoperative hematoma or fluid collection in the right upper lobe. The left lung is clear. IMPRESSION:  1. Stable postoperative changes with a small hematoma or fluid collection adjacent to the staple line in the right upper lobe. 2. Support apparatus in good position, unchanged.  No pneumothorax. Electronically Signed   By: Marijo Sanes M.D.   On: 05/09/2019 06:57   DG Chest Port 1 View  Result Date: 05/08/2019 CLINICAL DATA:  Postop status post VATS EXAM: PORTABLE CHEST 1 VIEW COMPARISON:  05/06/2019, CT 04/08/2019 FINDINGS: Interim placement of right-sided central venous catheter with tip over the SVC. Placement of right-sided chest tube with tip at the apex. Postsurgical changes in the right hilar region. Focal opacity in the right upper lobe. No visible pneumothorax. Normal heart size. IMPRESSION: 1. Placement of support lines and tubes on the right. Interval postsurgical changes of the right thorax. No discrete pneumothorax is seen 2. Focal airspace disease in the right suprahilar lung presumably due to postsurgical change Electronically Signed   By: Donavan Foil M.D.   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 Masslike area of ground-glass straddles the minor fissure with septal thickening and fissural retraction. While the lesion has increased in size minimally from 09/30/2018, there is clear enlargement from baseline examination on 02/16/2016. Therefore, lesion is characterized as worrisome for adenocarcinoma, Lung-RADS 4B, suspicious. Additional imaging evaluation or consultation with Pulmonology or Thoracic Surgery recommended. These results will be called to the ordering clinician or representative by the Radiologist Assistant, and communication documented in the PACS or zVision Dashboard. 2. Liver  hemangiomas and left adrenal adenoma, best characterized on 03/03/2016. 3. Aortic atherosclerosis (ICD10-170.0). Coronary artery calcification." -Discussed 02/25/2016 PET/CT ([id number]) completed on with results revealing "Dominant 8 mm right lower lobe pulmonary nodule shows no significant FDG uptake. Other scattered less than 5 mm bilateral pulmonary nodules also show no FDG uptake but are too small to characterize by PET. Recommend followup by chest CT in 6 months. (please use the following order, "CT CHEST LCS NODULE FOLLOW-UP W/O CM") Right middle lobe ground-glass opacity shows low-grade FDG uptake, and differential diagnosis includes inflammatory or infectious processes and low-grade adenocarcinoma. Recommend followup by chest CT in 6 months.  Multiple low-attenuation liver masses show no hypermetabolic activity compared to normal hepatic parenchyma. These remain indeterminate and cannot be characterized without IV contrast. Abdomen MRI without and with contrast recommended for further evaluation. 2.5 cm left adrenal mass shows mild FDG uptake, and remains indeterminate. Differential diagnosis includes atypical adenoma, metastasis, pheochromocytoma, or less likely adrenal cortical carcinoma. This can also be further evaluated with abdomen MRI without and with contrast." -Pt has no noted lymphadenopathy, no splenomegaly -Pt has a low-grade Non-Hodgkin's lymphoma -Advised pt that we don't typically treat low-grade lymphomas except when: causing constitutional symptoms, cytopenias, threatened end organs, or bulky disease that's bothersome -Advised pt that treating early has not been shown to provide better health outcomes -Advised pt of the low chance that his lymphoma may transform into a high-grade disease -Recommend pt stay up to date with annual flu vaccines and pneumonia vaccines -Advised pt to receive Covid-19 vaccine when  available -Recommended that the pt continue to eat well (including more  fruits and vegetables), drink at least 48-64 oz of water each day, reduce stress and walk 20-30 minutes each day.  -Will repeat PET/CT scan in 7 weeks to perform staging workup once inflammation from surgical settles down since his previous PET/CT was 3 yrs ago. -Will see back in 2 months with labs   FOLLOW UP: PET/CT and labs in 7 weeks RTC with Dr Irene Limbo in 8 weeks  All of the patients questions were answered with apparent satisfaction. The patient knows to call the clinic with any problems, questions or concerns.  I spent 36mins counseling the patient face to face. The total time spent in the appointment was 60 minutes and more than 50% was on counseling and direct patient cares.    Sullivan Lone MD [place] AAHIVMS [facility] Hematology/Oncology Physician [facility]  (Office):       [phone] (Work cell):  [phone] (Fax):           [phone]  05/26/2019 4:14 AM  I, Yevette Edwards, am acting as a scribe for Dr. Sullivan Lone.   .I have reviewed the above documentation for accuracy and completeness, and I agree with the above. Brunetta Genera MD

## 2019-05-27 ENCOUNTER — Encounter: Payer: Self-pay | Admitting: Thoracic Surgery (Cardiothoracic Vascular Surgery)

## 2019-05-27 ENCOUNTER — Ambulatory Visit
Admission: RE | Admit: 2019-05-27 | Discharge: 2019-05-27 | Disposition: A | Discharge status: Home / Self Care | Payer: 59 | Source: Ambulatory Visit | Attending: Thoracic Surgery (Cardiothoracic Vascular Surgery) | Admitting: Thoracic Surgery (Cardiothoracic Vascular Surgery)

## 2019-05-27 ENCOUNTER — Telehealth: Payer: Self-pay | Admitting: Hematology

## 2019-05-27 ENCOUNTER — Ambulatory Visit (INDEPENDENT_AMBULATORY_CARE_PROVIDER_SITE_OTHER): Payer: Self-pay | Admitting: Thoracic Surgery (Cardiothoracic Vascular Surgery)

## 2019-05-27 VITALS — BP 123/72 | HR 76 | Temp 97.5°F | Resp 16 | Ht 71.0 in | Wt 173.0 lb

## 2019-05-27 DIAGNOSIS — Z9889 Other specified postprocedural states: Secondary | ICD-10-CM

## 2019-05-27 DIAGNOSIS — Z902 Acquired absence of lung [part of]: Secondary | ICD-10-CM

## 2019-05-27 DIAGNOSIS — C884 Extranodal marginal zone B-cell lymphoma of mucosa-associated lymphoid tissue [MALT-lymphoma]: Secondary | ICD-10-CM

## 2019-05-27 NOTE — Telephone Encounter (Signed)
Scheduled appt per 12/21 los.  Spoke with pt and he is aware of his appts

## 2019-05-27 NOTE — Progress Notes (Signed)
[address]       [place] [address]             [phone]     HPI: Jose Harvey returns for a scheduled follow-up visit  Jose Harvey is a 58 year old man with a history of type 1 diabetes, remote tobacco abuse (quit 10 years ago), COPD, vertigo, adrenal adenoma, prostate cancer, anxiety, and a groundglass opacity in the right middle lobe.  He started the lung cancer screening program about 3 years ago.  He had a right middle lobe groundglass attenuation that was about 2 cm in diameter.  There was no activity on PET.  There were multiple other small groundglass opacities in the lung.  His most recent CT was 05/05/2019.  The groundglass opacity now measured almost 5 cm and.  Across the fissure into the upper lobe.  There also was a possible small solid component centrally.  I did a right VATS for right middle lobectomy and removed a portion of his right upper lobe en bloc with the specimen.  That was on 05/08/2019.  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  Postoperatively he had atrial fibrillation and was started on amiodarone.  He converted to sinus rhythm and so he went home on amiodarone but did not require anticoagulation.  He has been feeling well.  He has some incisional pain but is not taking any narcotics.  He is not aware of any rapid or irregular heart rhythms.  He saw Dr. Irene Limbo yesterday.  Past Medical History:  Diagnosis Date  . Adrenal adenoma, left 03/2017   noted on CT  . Allergic rhinitis, cause unspecified   . Anxiety state, unspecified   . Aortic atherosclerosis ([facility]) 03/2017   noted on CT  . BPV (benign positional vertigo)   . COPD (chronic obstructive pulmonary disease) with emphysema (HCC)    mild followed by [facility]   . Coronary artery disease   . Depressive disorder, not elsewhere classified   . Dyspnea    history of  . ED (erectile dysfunction)   . Gallbladder polyp 03/2016   noted on CT  . Hepatic hemangioma 03/2017   noted on CT  . History of kidney stones    hx of years ago   . Indigestion   . 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 Mixed hyperlipidemia   . Prostate cancer ([facility])   . 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 Type I (juvenile type) diabetes mellitus without mention of complication, not stated as uncontrolled    type I - followed by Dr Buddy Duty   . Vesicoureteral reflux     Current Outpatient Medications  Medication Sig Dispense Refill  . amiodarone (PACERONE) 200 MG tablet Take 1 tablet (200 mg total) by mouth 2 (two) times daily. 60 tablet 1  . aspirin 81 MG tablet Take 1 tablet (81 mg total) by mouth daily. 30 tablet   . cetirizine (KLS ALLER-TEC) 10 MG tablet Take 5 mg by mouth daily.    . Coenzyme Q10 (COQ10) 100 MG CAPS Take 100 mg by mouth daily.    . Glucos-Chond-Hyal Ac-Ca Fructo (MOVE FREE JOINT HEALTH ADVANCE PO) Take 1 tablet by mouth daily.    . Insulin Human (INSULIN PUMP) SOLN Inject into the skin continuous. NOVOLOG 100 UNIT/ML VIAL Basal rate per patient is as follows: at 12 midnite- .6 , at 0500am-.8 then at 0800am-.6    .  lisinopril (PRINIVIL,ZESTRIL) 5 MG tablet Take 5 mg by mouth daily.     . Multiple Vitamins-Minerals (ICAPS AREDS 2) CAPS Take 1 capsule by mouth daily.    . pantoprazole (PROTONIX) 40 MG tablet Take 40 mg by mouth daily.    . rosuvastatin (CRESTOR) 5 MG tablet Take 1 tablet (5 mg total) by mouth daily. 90 tablet 3  . sertraline (ZOLOFT) 100 MG tablet Take 100 mg by mouth daily.   0   No current facility-administered medications for this visit.    Physical Exam BP 123/72 (BP Location: Left Arm, Patient Position: Sitting, Cuff Size: Normal)   Pulse 76   Temp (!) 97.5 F (36.4 C)   Resp 16   Ht 5\' 11"  (1.803 m)   Wt 173 lb (78.5 kg)   SpO2 95% Comment: RA  BMI 24.13 kg/m  Incision clean and dry Lungs clear with equal BS Cardiac RRR  Diagnostic  Tests: CHEST - 2 VIEW  COMPARISON:  None.  FINDINGS: Resolution of the right pneumothorax. Persistent oval opacity along the major fissure. Minimally smaller. Left lung clear.  IMPRESSION: Resolution of the pneumothorax.  Persistent opacity along the major fissure on the right.   Electronically Signed   By: Leticia Penna M.D.   On: 05/27/2019 11:18 I personally reviewed the chest x-ray images and concur with the findings noted above  Impression: Jose Harvey is a 58 year old gentleman with a history of tobacco abuse who had a rather large groundglass opacity occupying most of his right middle lobe and extending into the lower portion of his right upper lobe on CT.  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  From a surgical standpoint he is doing well.  There were no restrictions on his activities at this point.  He was cautioned to build into new activities gradually.  Appropriate precautions were discussed for driving.  He did have postoperative atrial fibrillation.  He is on amiodarone 200 mg twice daily.  We will decrease that to 200 mg daily for the next 3 weeks.  He will then stop that medication.  He saw Dr. Irene Limbo yesterday and will follow up with him regarding the MALT lymphoma  Plan: Decrease amiodarone to 200 mg daily Return in 1 month to check rhythm strip Follow up with Dr. Clearnce Hasten, MD [facility] [phone]

## 2019-06-04 ENCOUNTER — Other Ambulatory Visit: Payer: Self-pay | Admitting: Physician Assistant

## 2019-06-24 ENCOUNTER — Ambulatory Visit (INDEPENDENT_AMBULATORY_CARE_PROVIDER_SITE_OTHER): Payer: Self-pay | Admitting: Thoracic Surgery (Cardiothoracic Vascular Surgery)

## 2019-06-24 ENCOUNTER — Encounter: Payer: Self-pay | Admitting: Thoracic Surgery (Cardiothoracic Vascular Surgery)

## 2019-06-24 ENCOUNTER — Other Ambulatory Visit: Payer: Self-pay

## 2019-06-24 VITALS — BP 120/75 | HR 76 | Temp 97.5°F | Resp 16 | Ht 71.0 in | Wt 173.0 lb

## 2019-06-24 DIAGNOSIS — Z9889 Other specified postprocedural states: Secondary | ICD-10-CM

## 2019-06-24 DIAGNOSIS — Z902 Acquired absence of lung [part of]: Secondary | ICD-10-CM

## 2019-06-24 DIAGNOSIS — Z09 Encounter for follow-up examination after completed treatment for conditions other than malignant neoplasm: Secondary | ICD-10-CM

## 2019-06-24 DIAGNOSIS — C884 Extranodal marginal zone B-cell lymphoma of mucosa-associated lymphoid tissue [MALT-lymphoma]: Secondary | ICD-10-CM

## 2019-06-24 NOTE — Progress Notes (Signed)
[address]       [place] [address]             [phone]       HPI: Mr. Jose Harvey returns for scheduled follow-up visit  Ruford Imburgia is a 59 year old man with a history of tobacco abuse who was found to have a groundglass opacity in the right middle lobe on a low-dose screening CT.  That actually grew over time and extended into the inferior portion of his right upper lobe.  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  Postoperatively he had some atrial fibrillation which converted to sinus rhythm with amiodarone.  He saw Dr. Irene Limbo.  He is planning a repeat PET/CT in February.  He feels well.  He does still have some soreness and numbness around the right pectoral area.  He is not taking any pain medication for that.  He has not had any respiratory issues.  Past Medical History:  Diagnosis Date  . Adrenal adenoma, left 03/2017   noted on CT  . Allergic rhinitis, cause unspecified   . Anxiety state, unspecified   . Aortic atherosclerosis ([facility]) 03/2017   noted on CT  . BPV (benign positional vertigo)   . COPD (chronic obstructive pulmonary disease) with emphysema (HCC)    mild followed by [facility]   . Coronary artery disease   . Depressive disorder, not elsewhere classified   . Dyspnea    history of  . ED (erectile dysfunction)   . Gallbladder polyp 03/2016   noted on CT  . Hepatic hemangioma 03/2017   noted on CT  . History of kidney stones    hx of years ago   . Indigestion   . 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 Mixed hyperlipidemia   . Prostate cancer ([facility])   . 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 Type I (juvenile type) diabetes mellitus without mention of complication, not stated as uncontrolled    type I - followed by Dr Buddy Duty   . Vesicoureteral reflux     Current Outpatient Medications  Medication Sig Dispense Refill  .  aspirin 81 MG tablet Take 1 tablet (81 mg total) by mouth daily. 30 tablet   . cetirizine (KLS ALLER-TEC) 10 MG tablet Take 5 mg by mouth daily.    . Coenzyme Q10 (COQ10) 100 MG CAPS Take 100 mg by mouth daily.    . Glucos-Chond-Hyal Ac-Ca Fructo (MOVE FREE JOINT HEALTH ADVANCE PO) Take 1 tablet by mouth daily.    . Insulin Human (INSULIN PUMP) SOLN Inject into the skin continuous. NOVOLOG 100 UNIT/ML VIAL Basal rate per patient is as follows: at 12 midnite- .6 , at 0500am-.8 then at 0800am-.6    . lisinopril (PRINIVIL,ZESTRIL) 5 MG tablet Take 5 mg by mouth daily.     . Multiple Vitamins-Minerals (ICAPS AREDS 2) CAPS Take 1 capsule by mouth daily.    . pantoprazole (PROTONIX) 40 MG tablet Take 40 mg by mouth daily.    . rosuvastatin (CRESTOR) 5 MG tablet Take 1 tablet (5 mg total) by mouth daily. 90 tablet 3  . sertraline (ZOLOFT) 100 MG tablet Take 100 mg by mouth daily.   0  . amiodarone (PACERONE) 200 MG tablet Take 1 tablet (200 mg total) by mouth 2 (two) times daily. (Patient not taking: Reported on 06/24/2019) 60 tablet 1   No current facility-administered  medications for this visit.    Physical Exam BP 120/75 (BP Location: Left Arm, Patient Position: Sitting, Cuff Size: Normal)   Pulse 76   Temp (!) 97.5 F (36.4 C)   Resp 16   Ht 5\' 11"  (1.803 m)   Wt 173 lb (78.5 kg)   SpO2 98% Comment: RA  BMI 24.62 kg/m  59 year old man in no acute distress Alert and oriented x3 with no focal deficits Lungs clear with equal breath sounds bilaterally Incisions well-healed  Diagnostic Tests: Rhythm strip shows normal sinus rhythm with a rate of 80  Impression: Orvan Wilmott is a 59 year old man with a history of tobacco abuse who was found to have a groundglass opacity on a low-dose screening CT.  Over time that expanded eventually to the point that it involve the inferior aspect of the right upper lobe.  I did a thoracoscopic right middle lobectomy taking a small rim of the upper lobe  with that.  Pathology showed MALT lymphoma.  He has done well from a surgical standpoint.  He does have some discomfort but is not requiring any medication for that.  He had postoperative atrial fibrillation.  He had no prior history.  He has been off the amiodarone for about the past week.  He is on sinus rhythm on his rhythm strip today.  He knows to call if he has any irregular heartbeats or rapid heartbeats.  Plan: Follow-up as scheduled with Dr. Irene Limbo I will be happy to see Mr. Winfrey back at anytime if I can be of any further assistance with his care  Melrose Nakayama, MD [facility] [phone]

## 2019-07-11 ENCOUNTER — Telehealth: Payer: Self-pay | Admitting: *Deleted

## 2019-07-11 ENCOUNTER — Other Ambulatory Visit: Payer: Self-pay | Admitting: Hematology

## 2019-07-11 DIAGNOSIS — C884 Extranodal marginal zone B-cell lymphoma of mucosa-associated lymphoid tissue [MALT-lymphoma]: Secondary | ICD-10-CM

## 2019-07-11 NOTE — Progress Notes (Signed)
PET/CT declined by insurance CT chest/abd/pelvis ordered instead.

## 2019-07-11 NOTE — Telephone Encounter (Signed)
Patient called -due to insurance, he wants to delay his PET scan until after April 1. If he delays it, he wants to cancel lab on 2/8 (prior to CT) and MD appt on 2/15 to review PET results.  Dr. Irene Limbo informed. Dr. Irene Limbo will change PET to CT. Appointments for 2/18 and 2/15 will be cancelled and rescheduled once CT is scheduled. Patient verbalized understanding.

## 2019-07-14 ENCOUNTER — Inpatient Hospital Stay: Payer: 59

## 2019-07-14 ENCOUNTER — Ambulatory Visit (HOSPITAL_COMMUNITY): Payer: 59

## 2019-07-14 ENCOUNTER — Telehealth: Payer: Self-pay | Admitting: Hematology

## 2019-07-14 NOTE — Telephone Encounter (Signed)
Rescheduled per 2/5 sch msg, pt req. Called and spoke with pt, confirmed 2/12 appt

## 2019-07-17 ENCOUNTER — Telehealth: Payer: Self-pay | Admitting: *Deleted

## 2019-07-17 NOTE — Telephone Encounter (Signed)
Patient cannot afford CT currently scheduled. Wants to r/s appts for lab/CT and Dr. Irene Limbo for May when new insurance becomes effective - if Dr.Kale thinks it is ok to wait. Dr. Irene Limbo informed. Per Dr. Irene Limbo - ok to wait until then. If patient does not feel well at any time, he should contact office and/or seek emergent care.  Contacted patient -  he verbalized understanding. Current appts cancelled. Schedule message sent

## 2019-07-18 ENCOUNTER — Other Ambulatory Visit: Payer: 59

## 2019-07-18 ENCOUNTER — Telehealth: Payer: Self-pay | Admitting: Hematology

## 2019-07-18 ENCOUNTER — Ambulatory Visit (HOSPITAL_COMMUNITY): Payer: 59

## 2019-07-18 NOTE — Telephone Encounter (Signed)
Scheduled appt per 2/11 sch message - pt aware of appt date and time

## 2019-07-21 ENCOUNTER — Ambulatory Visit: Payer: 59 | Admitting: Hematology

## 2019-09-17 ENCOUNTER — Telehealth: Payer: Self-pay | Admitting: *Deleted

## 2019-09-17 NOTE — Telephone Encounter (Signed)
Due to work schedule - patient needs to change current Lab/CT/MD appointments in May. Gave patient number for [facility] Sched - he will let Dr. Grier Mitts office know when Ct is scheduled. Will add lab and appt with Dr. Irene Limbo once CT scheduled.

## 2019-09-22 ENCOUNTER — Encounter (HOSPITAL_COMMUNITY): Payer: 59

## 2019-09-22 ENCOUNTER — Other Ambulatory Visit (HOSPITAL_COMMUNITY): Payer: 59

## 2019-10-20 ENCOUNTER — Other Ambulatory Visit: Payer: 59

## 2019-10-20 ENCOUNTER — Other Ambulatory Visit (HOSPITAL_COMMUNITY): Payer: 59

## 2019-10-22 ENCOUNTER — Ambulatory Visit: Payer: 59 | Admitting: Hematology

## 2019-10-23 DIAGNOSIS — E1065 Type 1 diabetes mellitus with hyperglycemia: Secondary | ICD-10-CM | POA: Diagnosis not present

## 2019-10-23 DIAGNOSIS — Z9641 Presence of insulin pump (external) (internal): Secondary | ICD-10-CM | POA: Diagnosis not present

## 2019-10-23 DIAGNOSIS — Z794 Long term (current) use of insulin: Secondary | ICD-10-CM | POA: Diagnosis not present

## 2019-10-31 ENCOUNTER — Ambulatory Visit (HOSPITAL_COMMUNITY)
Admission: RE | Admit: 2019-10-31 | Discharge: 2019-10-31 | Disposition: A | Discharge status: Home / Self Care | Payer: BC Managed Care – PPO | Source: Ambulatory Visit | Attending: Hematology | Admitting: Hematology

## 2019-10-31 ENCOUNTER — Inpatient Hospital Stay: Payer: BC Managed Care – PPO | Attending: Hematology

## 2019-10-31 ENCOUNTER — Other Ambulatory Visit: Payer: Self-pay

## 2019-10-31 DIAGNOSIS — C884 Extranodal marginal zone B-cell lymphoma of mucosa-associated lymphoid tissue [MALT-lymphoma]: Secondary | ICD-10-CM | POA: Diagnosis not present

## 2019-10-31 DIAGNOSIS — Z794 Long term (current) use of insulin: Secondary | ICD-10-CM | POA: Diagnosis not present

## 2019-10-31 DIAGNOSIS — E1065 Type 1 diabetes mellitus with hyperglycemia: Secondary | ICD-10-CM | POA: Diagnosis not present

## 2019-10-31 DIAGNOSIS — E103293 Type 1 diabetes mellitus with mild nonproliferative diabetic retinopathy without macular edema, bilateral: Secondary | ICD-10-CM | POA: Diagnosis not present

## 2019-10-31 LAB — CMP (CANCER CENTER ONLY)
ALT: 25 U/L (ref 0–44)
AST: 20 U/L (ref 15–41)
Albumin: 3.8 g/dL (ref 3.5–5.0)
Alkaline Phosphatase: 92 U/L (ref 38–126)
Anion gap: 11 (ref 5–15)
BUN: 26 mg/dL — ABNORMAL HIGH (ref 6–20)
CO2: 24 mmol/L (ref 22–32)
Calcium: 9 mg/dL (ref 8.9–10.3)
Chloride: 103 mmol/L (ref 98–111)
Creatinine: 1.12 mg/dL (ref 0.61–1.24)
GFR, Est AFR Am: >60 mL/min (ref >60)
GFR, Estimated: >60 mL/min (ref >60)
Glucose, Bld: 182 mg/dL — ABNORMAL HIGH (ref 70–99)
Potassium: 5 mmol/L (ref 3.5–5.1)
Sodium: 138 mmol/L (ref 135–145)
Total Bilirubin: 0.5 mg/dL (ref 0.3–1.2)
Total Protein: 7 g/dL (ref 6.5–8.1)

## 2019-10-31 LAB — CBC WITH DIFFERENTIAL/PLATELET
Abs Immature Granulocytes: 0.02 10*3/uL (ref 0.00–0.07)
Basophils Absolute: 0.1 10*3/uL (ref 0.0–0.1)
Basophils Relative: 1 %
Eosinophils Absolute: 0.6 10*3/uL — ABNORMAL HIGH (ref 0.0–0.5)
Eosinophils Relative: 9 %
HCT: 43.6 % (ref 39.0–52.0)
Hemoglobin: 14.4 g/dL (ref 13.0–17.0)
Immature Granulocytes: 0 %
Lymphocytes Relative: 19 %
Lymphs Abs: 1.3 10*3/uL (ref 0.7–4.0)
MCH: 30.8 pg (ref 26.0–34.0)
MCHC: 33 g/dL (ref 30.0–36.0)
MCV: 93.2 fL (ref 80.0–100.0)
Monocytes Absolute: 0.8 10*3/uL (ref 0.1–1.0)
Monocytes Relative: 12 %
Neutro Abs: 3.7 10*3/uL (ref 1.7–7.7)
Neutrophils Relative %: 59 %
Platelets: 215 10*3/uL (ref 150–400)
RBC: 4.68 MIL/uL (ref 4.22–5.81)
RDW: 12.7 % (ref 11.5–15.5)
WBC: 6.5 10*3/uL (ref 4.0–10.5)
nRBC: 0 % (ref 0.0–0.2)

## 2019-10-31 LAB — LACTATE DEHYDROGENASE: LDH: 140 U/L (ref 98–192)

## 2019-10-31 MED ORDER — SODIUM CHLORIDE (PF) 0.9 % IJ SOLN
INTRAMUSCULAR | Status: AC
  Filled 2019-10-31: qty 50

## 2019-10-31 MED ORDER — IOHEXOL 300 MG/ML  SOLN
100.0000 mL | Freq: Once | INTRAMUSCULAR | Status: AC | PRN
  Administered 2019-10-31: 100 mL via INTRAVENOUS

## 2019-11-04 ENCOUNTER — Other Ambulatory Visit: Payer: Self-pay

## 2019-11-04 ENCOUNTER — Inpatient Hospital Stay: Payer: BC Managed Care – PPO | Attending: Hematology | Admitting: Hematology

## 2019-11-04 VITALS — BP 121/73 | HR 70 | Temp 97.5°F | Resp 18 | Ht 71.0 in | Wt 180.3 lb

## 2019-11-04 DIAGNOSIS — E109 Type 1 diabetes mellitus without complications: Secondary | ICD-10-CM | POA: Insufficient documentation

## 2019-11-04 DIAGNOSIS — J449 Chronic obstructive pulmonary disease, unspecified: Secondary | ICD-10-CM | POA: Insufficient documentation

## 2019-11-04 DIAGNOSIS — Z79899 Other long term (current) drug therapy: Secondary | ICD-10-CM | POA: Diagnosis not present

## 2019-11-04 DIAGNOSIS — R0602 Shortness of breath: Secondary | ICD-10-CM | POA: Diagnosis not present

## 2019-11-04 DIAGNOSIS — C884 Extranodal marginal zone B-cell lymphoma of mucosa-associated lymphoid tissue [MALT-lymphoma]: Secondary | ICD-10-CM | POA: Insufficient documentation

## 2019-11-04 DIAGNOSIS — Z87891 Personal history of nicotine dependence: Secondary | ICD-10-CM | POA: Insufficient documentation

## 2019-11-04 DIAGNOSIS — Z8546 Personal history of malignant neoplasm of prostate: Secondary | ICD-10-CM | POA: Diagnosis not present

## 2019-11-04 DIAGNOSIS — Z833 Family history of diabetes mellitus: Secondary | ICD-10-CM | POA: Diagnosis not present

## 2019-11-04 DIAGNOSIS — Z8249 Family history of ischemic heart disease and other diseases of the circulatory system: Secondary | ICD-10-CM | POA: Insufficient documentation

## 2019-11-04 DIAGNOSIS — Z836 Family history of other diseases of the respiratory system: Secondary | ICD-10-CM | POA: Insufficient documentation

## 2019-11-04 DIAGNOSIS — Z7289 Other problems related to lifestyle: Secondary | ICD-10-CM | POA: Insufficient documentation

## 2019-11-04 DIAGNOSIS — I700 Atherosclerosis of aorta: Secondary | ICD-10-CM | POA: Insufficient documentation

## 2019-11-04 NOTE — Progress Notes (Signed)
HEMATOLOGY/ONCOLOGY CONSULTATION NOTE  Date of Service: 11/04/2019  Patient Care Team: Jose Cha, MD as PCP - General (Internal Medicine)  CHIEF COMPLAINTS/PURPOSE OF CONSULTATION:  MALT Lymphoma  HISTORY OF PRESENTING ILLNESS:   Jose Harvey is a wonderful 59 y.o. 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 Pt is accompanied today by his sister. The pt reports that he is doing well overall.  The pt reports that he was a long-term smoker and due to that he has been a participant in a lung cancer screening program. While doing routine monitoring small lung nodules were first noticed in 2017. They did not feel that action needed to be taken at that time so they simply continued monitoring him. A significant change was noticed within the last 6 months. Pt denies any fevers, chills, night sweats, SOB during this time. Pt had a Thoracoscopy with Middle Lobectomy with Dr. Koleen Nimrod on 05/08/2019. He currently feels well and denies any of the aforementioned symptoms except an increase in SOB, noticed after surgery.   Pt quit smoking about 10 years ago. He was diagnosed with COPD in 2018 and given an inhaler. He did not notice a difference with the inhaler so he has since stopped using it. Pt has not had any issues with vertigo in sometime and his diabetes has been well controlled. Pt has a history of Prostate Cancer and had it resected in January of 2019. Pt also had a cholecystectomy, which they decided on due to activity seen on 2017 PET scan. He denies any issues resulting from his gallbladder removal. Diabetes and heart disease runs in his family. His father had prostate cancer and heart disease as well.   Pt will return to work in Spring. There is dust and floating debris at his job but he is not around it very often. Pt has stayed up to date with his annual flu vaccine as well as his pneumonia vaccines.   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 Masslike area of ground-glass straddles the minor fissure with septal thickening and fissural retraction. While the lesion has increased in size minimally from 09/30/2018, there is clear enlargement from baseline examination on 02/16/2016. Therefore, lesion is characterized as worrisome for adenocarcinoma, Lung-RADS 4B, suspicious. Additional imaging evaluation or consultation with Pulmonology or Thoracic Surgery recommended. These results will be called to the ordering clinician or representative by the Radiologist Assistant, and communication documented in the PACS or zVision Dashboard. 2. Liver hemangiomas and left adrenal adenoma, best characterized on 03/03/2016. 3. Aortic atherosclerosis (ICD10-170.0). Coronary artery calcification."  Pt has had PET/CT ([id number]) completed on 02/25/2016 with results revealing "Dominant 8 mm right lower lobe pulmonary nodule shows no significant FDG uptake. Other scattered less than 5 mm bilateral pulmonary nodules also show no FDG uptake but are too small to characterize by PET. Recommend followup by chest CT in 6 months. (please use the following order, "CT CHEST LCS NODULE FOLLOW-UP W/O CM") Right middle lobe ground-glass opacity shows low-grade FDG uptake, and differential diagnosis includes inflammatory or infectious processes and low-grade adenocarcinoma. Recommend followup by chest CT in 6 months.  Multiple low-attenuation liver  masses show no hypermetabolic activity compared to normal hepatic parenchyma. These remain indeterminate and cannot be characterized without IV contrast.  Abdomen MRI without and with contrast recommended for further evaluation. 2.5 cm left adrenal mass shows mild FDG uptake, and remains indeterminate. Differential diagnosis includes atypical adenoma, metastasis, pheochromocytoma, or less likely adrenal cortical carcinoma. This can also be further evaluated with abdomen MRI without and with contrast."  Most recent lab results (05/12/2019) of CBC is as follows: all values are WNL except for RBC at 3.62, Hgb at 11.2, HCT at 34.2.  On review of systems, pt reports increased SOB and denies testicular pain/swelling, leg swelling, fevers, chills, night sweats and any other symptoms.   On PMHx the pt reports COPD with emphysema, Benign Positional Vertigo, Diabetes, Prostate Cancer, Prostatectomy (2019), Cholecystectomy. On Social Hx the pt reports he is a former smoker who quit 10 years ago On Family Hx the pt reports that his father had Prostate Cancer and Heart Disease, additional family history of Heart Disease and Diabetes.  INTERVAL HISTORY:  Jose Harvey is a wonderful 59 y.o. 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 [name] last visit with [place] was on 05/26/2019. The pt reports that he is doing well overall.  The pt reports that his breathing has improved and he is feeling well. He has had some congestion, which he attributes to allergies, but denies any productive cough. Pt had his first dose of the COVID19 vaccine and has the second scheduled for today. He does have occasional burning in his right chest/abdomen that is reminiscent of the discomfort he felt during his Thoracoscopy.   Of note since the patient's last visit, pt has had CT C/A/P ([id number]) ([id number]) completed on 10/31/2019 with results revealing "1. Indistinct ground-glass 2.2 cm anterior left upper lobe pulmonary nodule is stable since 04/08/2019 chest CT and is morphologically similar to the lymphomatous pulmonary lesion resected in the right middle  lobe. Tiny 3 mm solid posterior right upper lobe pulmonary nodule, not clearly seen on 04/08/2019 chest CT. These findings warrant continued chest CT surveillance. 2. Fluid attenuation 1.6 cm ovoid structure in the left pelvic sidewall in the left external iliac chain region, new since most recent comparison study of 02/25/2016 PET-CT, nonspecific, favoring a postsurgical seroma or lymphocele given history of interval prostatectomy. A necrotic enlarged left external iliac lymph node is considered less likely although not entirely excluded. 3. Otherwise no lymphadenopathy in the chest, abdomen or pelvis. Normal size spleen. 4. Stable left adrenal adenoma."  Lab results (10/31/19) of CBC w/diff and CMP is as follows: all values are WNL except for Eos Abs at 0.6K, Glucose at 182, BUN at 26. 10/31/2019 LDH at 140  On review of systems, pt reports congestion and denies fevers, chills, night sweats, SOB, cough, unexpected weight loss and any other symptoms.   MEDICAL HISTORY:  Past Medical History:  Diagnosis Date  . Adrenal adenoma, left 03/2017   noted on CT  . Allergic rhinitis, cause unspecified   . Anxiety state, unspecified   . Aortic atherosclerosis ([facility]) 03/2017   noted on CT  . BPV (benign positional vertigo)   . COPD (chronic obstructive pulmonary disease) with emphysema ([facility])    mild followed by [facility]   . Coronary artery disease   . Depressive disorder, not elsewhere classified   . Dyspnea    history of  . ED (erectile dysfunction)   . Gallbladder polyp 03/2016   noted on CT  .  Hepatic hemangioma 03/2017   noted on CT  . History of kidney stones    hx of years ago   . Indigestion   . 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 Mixed hyperlipidemia   . Prostate cancer ([place])   . 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 Type I (juvenile type) diabetes mellitus without mention of  complication, not stated as uncontrolled    type I - followed by Dr Buddy Duty   . Vesicoureteral reflux     SURGICAL HISTORY: Past Surgical History:  Procedure Laterality Date  . CHOLECYSTECTOMY N/A 05/17/2016   Procedure: LAPAROSCOPIC CHOLECYSTECTOMY;  Surgeon: Clovis Riley, MD;  Location: [facility];  Service: General;  Laterality: N/A;  . cyst removed from throat     . dental surgeries     . LYMPHADENECTOMY Bilateral 07/02/2017   Procedure: BILATERAL LYMPHADENECTOMY;  Surgeon: Lucas Mallow, MD;  Location: [facility];  Service: Urology;  Laterality: Bilateral;  . PROSTATE BIOPSY    . ROBOT ASSISTED LAPAROSCOPIC RADICAL PROSTATECTOMY N/A 07/02/2017   Procedure: XI ROBOTIC ASSISTED LAPAROSCOPIC RADICAL PROSTATECTOMY;  Surgeon: Lucas Mallow, MD;  Location: [facility];  Service: Urology;  Laterality: N/A;  . TONSILLECTOMY    . VIDEO ASSISTED THORACOSCOPY (VATS)/ LOBECTOMY Right 05/08/2019   Procedure: Right VIDEO ASSISTED THORACOSCOPY with   MIDDLE LOBECTOMY and Enbloc portion of Upper Lobe with Node dissection, Intercostal Nerve Block;  Surgeon: Melrose Nakayama, MD;  Location: [facility];  Service: Thoracic;  Laterality: Right;  . WISDOM TOOTH EXTRACTION      SOCIAL HISTORY: Social History   Socioeconomic History  . Marital status: Divorced    Spouse name: Not on file  . Number of children: 2  . Years of education: Not on file  . 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 Smoking status: Former Smoker    Packs/day: 1.50    Years: 27.00    Pack years: 40.50    Types: Cigarettes    Quit date: 06/05/2008    Years since quitting: 11.4  . Smokeless tobacco: Former Systems developer    Types: Snuff    Quit date: 2015  Substance and Sexual Activity  . Alcohol use: Yes    Alcohol/week: 6.0 standard drinks    Types: 6 Cans of beer per week    Comment: occasional 6pk per week  . Drug use: No  . Sexual activity: Not Currently  Other  Topics Concern  . Not on file  Social History Narrative   Patient divorced. Patient has two grown daughters but neither lives locally.   Social Determinants of Health   Financial Resource Strain:   . Difficulty of Paying Living Expenses:   Food Insecurity:   . Worried About Charity fundraiser in the Last Year:   . Arboriculturist in the Last Year:   Transportation Needs:   . Film/video editor (Medical):   Marland Kitchen Lack of Transportation (Non-Medical):   Physical Activity:   . Days of Exercise per Week:   . Minutes of Exercise per Session:   Stress:   . Feeling of Stress :   Social Connections:   . Frequency of Communication with Friends and Family:   . Frequency of Social Gatherings with Friends and Family:   . Attends Religious Services:   . Active Member of Clubs or Organizations:   .  Attends Archivist Meetings:   Marland Kitchen Marital Status:   Intimate Partner Violence:   . Fear of Current or Ex-Partner:   . Emotionally Abused:   Marland Kitchen Physically Abused:   . Sexually Abused:     FAMILY HISTORY: Family History  Problem Relation Age of Onset  . Diabetes type I Father   . Heart attack Father   . Hypertension Father   . Emphysema Father   . CAD Father   . Colon cancer Maternal Grandfather   . Emphysema Paternal Grandfather   . Diabetes type I Paternal Grandfather   . Kidney disease Neg Hx   . Liver disease Neg Hx   . Breast cancer Neg Hx   . Prostate cancer Neg Hx     ALLERGIES:  is allergic to crestor [rosuvastatin]; lipitor [atorvastatin]; other; and zocor [simvastatin].  MEDICATIONS:  Current Outpatient Medications  Medication Sig Dispense Refill  . aspirin 81 MG tablet Take 1 tablet (81 mg total) by mouth daily. 30 tablet   . cetirizine (KLS ALLER-TEC) 10 MG tablet Take 5 mg by mouth daily.    . Coenzyme Q10 (COQ10) 100 MG CAPS Take 100 mg by mouth daily.    . Continuous Blood Gluc Sensor (FREESTYLE LIBRE 14 DAY SENSOR) MISC AS DIRECTED CHANGE SENSOR EVERY 14  DAYS 28    . Insulin Human (INSULIN PUMP) SOLN Inject into the skin continuous. NOVOLOG 100 UNIT/ML VIAL Basal rate per patient is as follows: at 12 midnite- .6 , at 0500am-.8 then at 0800am-.6    . lisinopril (PRINIVIL,ZESTRIL) 5 MG tablet Take 5 mg by mouth daily.     . Multiple Vitamins-Minerals (ICAPS AREDS 2) CAPS Take 1 capsule by mouth daily.    Marland Kitchen NOVOLOG 100 UNIT/ML injection USE AS DIRECTED VIA INSULIN PUMP. MAX 66 UNITS DAILY 90    . pantoprazole (PROTONIX) 40 MG tablet Take 40 mg by mouth daily.    . rosuvastatin (CRESTOR) 5 MG tablet Take 1 tablet (5 mg total) by mouth daily. 90 tablet 3  . sertraline (ZOLOFT) 100 MG tablet Take 100 mg by mouth daily.   0   No current facility-administered medications for this visit.    REVIEW OF SYSTEMS:   A 10+ POINT REVIEW OF SYSTEMS WAS OBTAINED including neurology, dermatology, psychiatry, cardiac, respiratory, lymph, extremities, GI, GU, Musculoskeletal, constitutional, breasts, reproductive, HEENT.  All pertinent positives are noted in the HPI.  All others are negative.   PHYSICAL EXAMINATION: ECOG PERFORMANCE STATUS: 0 - Asymptomatic  . Vitals:   11/04/19 1015  BP: 121/73  Pulse: 70  Resp: 18  Temp: (!) 97.5 F (36.4 C)  SpO2: 100%   Filed Weights   11/04/19 1015  Weight: 180 lb 4.8 oz (81.8 kg)   .Body mass index is 25.15 kg/m.  GENERAL:alert, in no acute distress and comfortable SKIN: no acute rashes, no significant lesions EYES: conjunctiva are pink and non-injected, sclera anicteric OROPHARYNX: MMM, no exudates, no oropharyngeal erythema or ulceration NECK: supple, no JVD LYMPH:  no palpable lymphadenopathy in the cervical, axillary or inguinal regions LUNGS: clear to auscultation b/l with normal respiratory effort HEART: regular rate & rhythm ABDOMEN:  normoactive bowel sounds , non tender, not distended. No palpable hepatosplenomegaly.  Extremity: no pedal edema PSYCH: alert & oriented x 3 with fluent  speech NEURO: no focal motor/sensory deficits  LABORATORY DATA:  I have reviewed the data as listed  . CBC Latest Ref Rng & Units 10/31/2019 05/12/2019 05/10/2019  WBC 4.0 -  10.5 K/uL 6.5 9.4 11.4(H)  Hemoglobin 13.0 - 17.0 g/dL 14.4 11.2(L) 11.4(L)  Hematocrit 39.0 - 52.0 % 43.6 34.2(L) 34.4(L)  Platelets 150 - 400 K/uL 215 169 156    . CMP Latest Ref Rng & Units 10/31/2019 05/10/2019 05/09/2019  Glucose 70 - 99 mg/dL 182(H) 202(H) 296(H)  BUN 6 - 20 mg/dL 26(H) 14 23(H)  Creatinine 0.61 - 1.24 mg/dL 1.12 0.78 0.92  Sodium 135 - 145 mmol/L 138 137 134(L)  Potassium 3.5 - 5.1 mmol/L 5.0 3.9 4.1  Chloride 98 - 111 mmol/L 103 105 100  CO2 22 - 32 mmol/L 24 27 24   Calcium 8.9 - 10.3 mg/dL 9.0 8.0(L) 8.3(L)  Total Protein 6.5 - 8.1 g/dL 7.0 5.1(L) -  Total Bilirubin 0.3 - 1.2 mg/dL 0.5 0.7 -  Alkaline Phos 38 - 126 U/L 92 55 -  AST 15 - 41 U/L 20 19 -  ALT 0 - 44 U/L 25 19 -   05/08/2019 Surgical Pathology [phone]):    05/08/2019 Surgical Pathology [phone]):    RADIOGRAPHIC STUDIES: I have personally reviewed the radiological images as listed and agreed with the findings in the report. CT Chest W Contrast  Result Date: 10/31/2019 CLINICAL DATA:  Extranodal marginal zone B-cell lymphoma, initial staging, diagnosed on right-sided VATS with right middle lobectomy and right upper lobe wedge resection 05/08/2019. EXAM: CT CHEST, ABDOMEN, AND PELVIS WITH CONTRAST TECHNIQUE: Multidetector CT imaging of the chest, abdomen and pelvis was performed following the standard protocol during bolus administration of intravenous contrast. CONTRAST:  175mL OMNIPAQUE IOHEXOL 300 MG/ML  SOLN COMPARISON:  04/08/2019 screening chest CT. 02/25/2016 PET-CT. 03/03/2016 MRI abdomen. FINDINGS: CT CHEST FINDINGS Cardiovascular: Normal heart size. No significant pericardial effusion/thickening. Left anterior descending and left circumflex coronary atherosclerosis. Atherosclerotic nonaneurysmal  thoracic aorta. Normal caliber pulmonary arteries. No central pulmonary emboli. Mediastinum/Nodes: Heterogeneous hypodense 1.6 cm inferior left thyroid nodule (series 2/image 11), stable. Unremarkable esophagus. No axillary adenopathy. Surgical clips noted in right hilum and right subcarinal region. No pathologically enlarged mediastinal or hilar lymph nodes. Lungs/Pleura: No pneumothorax. No pleural effusion. Status post right middle lobectomy and basilar right upper lobe wedge resection. Solid 3 mm posterior peripheral right upper lobe pulmonary nodule (series 4/image 63), not clearly seen on 04/08/2019 chest CT. Posteromedial right upper lobe subpleural 3 mm solid pulmonary nodule (series 4/image 49) and medial left upper lobe 3 mm solid pulmonary nodule (series 4/image 42), both stable. Indistinct 2.2 x 1.7 cm ground-glass anterior left upper lobe pulmonary nodule (series 4/image 40), previously 2.2 x 1.8 cm using similar measurement technique on 04/08/2019 CT, not appreciably changed. No acute consolidative airspace disease or additional significant pulmonary nodules. Musculoskeletal: No aggressive appearing focal osseous lesions. Moderate thoracic spondylosis. CT ABDOMEN PELVIS FINDINGS Hepatobiliary: Normal liver size. At least 4 scattered liver hemangiomas as previously characterized on 03/03/2016 MRI abdomen study, largest 3.1 cm in the anterior left liver lobe. No new or suspicious liver masses. Cholecystectomy. No biliary ductal dilatation. Pancreas: Normal, with no mass or duct dilation. Spleen: Normal size. No mass. Adrenals/Urinary Tract: Normal right adrenal. Left adrenal 2.3 cm nodule with density 71 HU, stable since 03/03/2016 MRI, where it was characterized as a benign adenoma. Normal kidneys with no hydronephrosis and no renal mass. Normal bladder. Stomach/Bowel: Normal non-distended stomach. Normal caliber small bowel with no small bowel wall thickening. Normal appendix. Oral contrast transits to  the rectum. Normal large bowel with no diverticulosis, large bowel wall thickening or pericolonic fat stranding. Vascular/Lymphatic: Atherosclerotic nonaneurysmal  abdominal aorta. Patent portal, splenic, hepatic and renal veins. No pathologically enlarged lymph nodes in the abdomen. There is a fluid-attenuation 1.6 x 1.2 cm ovoid structure in left external iliac chain region (series 2/image 112), new since 02/25/2016 PET-CT. Otherwise no pathologically enlarged pelvic lymph nodes. Reproductive: Prostatectomy. Other: No pneumoperitoneum, ascites or focal fluid collection. Small fat containing umbilical hernia. Musculoskeletal: No aggressive appearing focal osseous lesions. Mild lumbar spondylosis. IMPRESSION: 1. Indistinct ground-glass 2.2 cm anterior left upper lobe pulmonary nodule is stable since 04/08/2019 chest CT and is morphologically similar to the lymphomatous pulmonary lesion resected in the right middle lobe. Tiny 3 mm solid posterior right upper lobe pulmonary nodule, not clearly seen on 04/08/2019 chest CT. These findings warrant continued chest CT surveillance. 2. Fluid attenuation 1.6 cm ovoid structure in the left pelvic sidewall in the left external iliac chain region, new since most recent comparison study of 02/25/2016 PET-CT, nonspecific, favoring a postsurgical seroma or lymphocele given history of interval prostatectomy. A necrotic enlarged left external iliac lymph node is considered less likely although not entirely excluded. 3. Otherwise no lymphadenopathy in the chest, abdomen or pelvis. Normal size spleen. 4. Stable left adrenal adenoma. 5. Aortic Atherosclerosis (ICD10-I70.0). Electronically Signed   By: Ilona Sorrel M.D.   On: 10/31/2019 14:09   CT Abdomen Pelvis W Contrast  Result Date: 10/31/2019 CLINICAL DATA:  Extranodal marginal zone B-cell lymphoma, initial staging, diagnosed on right-sided VATS with right middle lobectomy and right upper lobe wedge resection 05/08/2019. EXAM:  CT CHEST, ABDOMEN, AND PELVIS WITH CONTRAST TECHNIQUE: Multidetector CT imaging of the chest, abdomen and pelvis was performed following the standard protocol during bolus administration of intravenous contrast. CONTRAST:  159mL OMNIPAQUE IOHEXOL 300 MG/ML  SOLN COMPARISON:  04/08/2019 screening chest CT. 02/25/2016 PET-CT. 03/03/2016 MRI abdomen. FINDINGS: CT CHEST FINDINGS Cardiovascular: Normal heart size. No significant pericardial effusion/thickening. Left anterior descending and left circumflex coronary atherosclerosis. Atherosclerotic nonaneurysmal thoracic aorta. Normal caliber pulmonary arteries. No central pulmonary emboli. Mediastinum/Nodes: Heterogeneous hypodense 1.6 cm inferior left thyroid nodule (series 2/image 11), stable. Unremarkable esophagus. No axillary adenopathy. Surgical clips noted in right hilum and right subcarinal region. No pathologically enlarged mediastinal or hilar lymph nodes. Lungs/Pleura: No pneumothorax. No pleural effusion. Status post right middle lobectomy and basilar right upper lobe wedge resection. Solid 3 mm posterior peripheral right upper lobe pulmonary nodule (series 4/image 63), not clearly seen on 04/08/2019 chest CT. Posteromedial right upper lobe subpleural 3 mm solid pulmonary nodule (series 4/image 49) and medial left upper lobe 3 mm solid pulmonary nodule (series 4/image 42), both stable. Indistinct 2.2 x 1.7 cm ground-glass anterior left upper lobe pulmonary nodule (series 4/image 40), previously 2.2 x 1.8 cm using similar measurement technique on 04/08/2019 CT, not appreciably changed. No acute consolidative airspace disease or additional significant pulmonary nodules. Musculoskeletal: No aggressive appearing focal osseous lesions. Moderate thoracic spondylosis. CT ABDOMEN PELVIS FINDINGS Hepatobiliary: Normal liver size. At least 4 scattered liver hemangiomas as previously characterized on 03/03/2016 MRI abdomen study, largest 3.1 cm in the anterior left liver  lobe. No new or suspicious liver masses. Cholecystectomy. No biliary ductal dilatation. Pancreas: Normal, with no mass or duct dilation. Spleen: Normal size. No mass. Adrenals/Urinary Tract: Normal right adrenal. Left adrenal 2.3 cm nodule with density 71 HU, stable since 03/03/2016 MRI, where it was characterized as a benign adenoma. Normal kidneys with no hydronephrosis and no renal mass. Normal bladder. Stomach/Bowel: Normal non-distended stomach. Normal caliber small bowel with no small bowel wall thickening. Normal  appendix. Oral contrast transits to the rectum. Normal large bowel with no diverticulosis, large bowel wall thickening or pericolonic fat stranding. Vascular/Lymphatic: Atherosclerotic nonaneurysmal abdominal aorta. Patent portal, splenic, hepatic and renal veins. No pathologically enlarged lymph nodes in the abdomen. There is a fluid-attenuation 1.6 x 1.2 cm ovoid structure in left external iliac chain region (series 2/image 112), new since 02/25/2016 PET-CT. Otherwise no pathologically enlarged pelvic lymph nodes. Reproductive: Prostatectomy. Other: No pneumoperitoneum, ascites or focal fluid collection. Small fat containing umbilical hernia. Musculoskeletal: No aggressive appearing focal osseous lesions. Mild lumbar spondylosis. IMPRESSION: 1. Indistinct ground-glass 2.2 cm anterior left upper lobe pulmonary nodule is stable since 04/08/2019 chest CT and is morphologically similar to the lymphomatous pulmonary lesion resected in the right middle lobe. Tiny 3 mm solid posterior right upper lobe pulmonary nodule, not clearly seen on 04/08/2019 chest CT. These findings warrant continued chest CT surveillance. 2. Fluid attenuation 1.6 cm ovoid structure in the left pelvic sidewall in the left external iliac chain region, new since most recent comparison study of 02/25/2016 PET-CT, nonspecific, favoring a postsurgical seroma or lymphocele given history of interval prostatectomy. A necrotic enlarged  left external iliac lymph node is considered less likely although not entirely excluded. 3. Otherwise no lymphadenopathy in the chest, abdomen or pelvis. Normal size spleen. 4. Stable left adrenal adenoma. 5. Aortic Atherosclerosis (ICD10-I70.0). Electronically Signed   By: Ilona Sorrel M.D.   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 Masslike area of ground-glass straddles the minor fissure with septal thickening and fissural retraction. While the lesion has increased in size minimally from 09/30/2018, there is clear enlargement from baseline examination on 02/16/2016. Therefore, lesion is characterized as worrisome for adenocarcinoma, Lung-RADS 4B, suspicious. -02/25/2016 PET/CT ([id number]) revealed "Dominant 8 mm right lower lobe pulmonary nodule shows no significant FDG uptake. Other scattered less than 5 mm bilateral pulmonary nodules also show no FDG uptake but are too small to characterize by PET. Right middle lobe ground-glass opacity shows low-grade FDG uptake, and differential diagnosis includes inflammatory or infectious processes and low-grade adenocarcinoma. Multiple low-attenuation liver masses show no hypermetabolic activity compared to normal hepatic parenchyma. These remain indeterminate and cannot be characterized without IV contrast. Abdomen MRI without and with contrast recommended for further evaluation. 2.5 cm left adrenal mass shows mild FDG uptake, and remains indeterminate. Differential diagnosis includes atypical adenoma, metastasis, pheochromocytoma, or less likely adrenal cortical carcinoma."  PLAN: -Discussed pt labwork,  10/31/19; blood counts are nml and blood chemistries are stable, Glucose is slightly elevated, LDH is WNL -Discussed 10/31/2019 CT C/A/P ([id number]) ([id number]) which revealed unchanged left upper lobe lung lesion, small nodule in right upper lung, hemangiomas of the liver, and stable left adrenal nodule  -No lab, clinical, or radiographic evidence of progression of pt's Non-Hodgkin's lymphoma at this time.  -Advised pt that we would not move to treat unless new symptoms or rapidly progressing disease. No indication for treatment at this time. -Plan to monitor with labs and clinic visits every 6 months and repeat scans in 12 months, provided no new concerns. -Recommend pt receive his second COVID19 vaccine as scheduled -Recommend pt stay up to date with annual flu vaccine and pneumonia vaccines -  Will see back in 6 months    FOLLOW UP: RTC with Dr Irene Limbo with labs in 6 months   The total time spent in the appt was 20 minutes and more than 50% was on counseling and direct patient cares.  All of the patient's questions were answered with apparent satisfaction. The patient knows to call the clinic with any problems, questions or concerns.    Sullivan Lone MD [place] AAHIVMS [facility] Hematology/Oncology Physician [facility]  (Office):       [phone] (Work cell):  [phone] (Fax):           [phone]  11/04/2019 12:44 PM  I, Yevette Edwards, am acting as a scribe for Dr. Sullivan Lone.   .I have reviewed the above documentation for accuracy and completeness, and I agree with the above. Brunetta Genera MD

## 2019-11-13 ENCOUNTER — Telehealth: Payer: Self-pay | Admitting: Hematology

## 2019-11-13 NOTE — Telephone Encounter (Signed)
Scheduled per 06/01 los, patient has been called and notified. 

## 2019-12-31 DIAGNOSIS — C61 Malignant neoplasm of prostate: Secondary | ICD-10-CM | POA: Diagnosis not present

## 2020-01-14 DIAGNOSIS — E103293 Type 1 diabetes mellitus with mild nonproliferative diabetic retinopathy without macular edema, bilateral: Secondary | ICD-10-CM | POA: Diagnosis not present

## 2020-01-14 DIAGNOSIS — C8599 Non-Hodgkin lymphoma, unspecified, extranodal and solid organ sites: Secondary | ICD-10-CM | POA: Diagnosis not present

## 2020-01-14 DIAGNOSIS — Z794 Long term (current) use of insulin: Secondary | ICD-10-CM | POA: Diagnosis not present

## 2020-01-14 DIAGNOSIS — R590 Localized enlarged lymph nodes: Secondary | ICD-10-CM | POA: Diagnosis not present

## 2020-01-15 DIAGNOSIS — L821 Other seborrheic keratosis: Secondary | ICD-10-CM | POA: Diagnosis not present

## 2020-01-15 DIAGNOSIS — L570 Actinic keratosis: Secondary | ICD-10-CM | POA: Diagnosis not present

## 2020-01-15 DIAGNOSIS — D1801 Hemangioma of skin and subcutaneous tissue: Secondary | ICD-10-CM | POA: Diagnosis not present

## 2020-01-15 DIAGNOSIS — D225 Melanocytic nevi of trunk: Secondary | ICD-10-CM | POA: Diagnosis not present

## 2020-01-15 DIAGNOSIS — D485 Neoplasm of uncertain behavior of skin: Secondary | ICD-10-CM | POA: Diagnosis not present

## 2020-01-21 DIAGNOSIS — Z9641 Presence of insulin pump (external) (internal): Secondary | ICD-10-CM | POA: Diagnosis not present

## 2020-01-21 DIAGNOSIS — Z794 Long term (current) use of insulin: Secondary | ICD-10-CM | POA: Diagnosis not present

## 2020-01-21 DIAGNOSIS — E1065 Type 1 diabetes mellitus with hyperglycemia: Secondary | ICD-10-CM | POA: Diagnosis not present

## 2020-02-17 DIAGNOSIS — Z794 Long term (current) use of insulin: Secondary | ICD-10-CM | POA: Diagnosis not present

## 2020-02-17 DIAGNOSIS — E1065 Type 1 diabetes mellitus with hyperglycemia: Secondary | ICD-10-CM | POA: Diagnosis not present

## 2020-02-17 DIAGNOSIS — E103293 Type 1 diabetes mellitus with mild nonproliferative diabetic retinopathy without macular edema, bilateral: Secondary | ICD-10-CM | POA: Diagnosis not present

## 2020-02-19 ENCOUNTER — Inpatient Hospital Stay: Payer: BC Managed Care – PPO | Attending: Hematology | Admitting: Hematology

## 2020-02-19 ENCOUNTER — Inpatient Hospital Stay: Payer: BC Managed Care – PPO

## 2020-02-19 ENCOUNTER — Other Ambulatory Visit: Payer: Self-pay

## 2020-02-19 ENCOUNTER — Other Ambulatory Visit: Payer: BC Managed Care – PPO

## 2020-02-19 VITALS — BP 136/72 | HR 87 | Temp 96.4°F | Resp 18 | Ht 71.0 in | Wt 182.8 lb

## 2020-02-19 DIAGNOSIS — Z8546 Personal history of malignant neoplasm of prostate: Secondary | ICD-10-CM | POA: Diagnosis not present

## 2020-02-19 DIAGNOSIS — Z836 Family history of other diseases of the respiratory system: Secondary | ICD-10-CM | POA: Insufficient documentation

## 2020-02-19 DIAGNOSIS — Z800 Family history of malignant neoplasm of digestive organs: Secondary | ICD-10-CM | POA: Insufficient documentation

## 2020-02-19 DIAGNOSIS — Z7289 Other problems related to lifestyle: Secondary | ICD-10-CM | POA: Diagnosis not present

## 2020-02-19 DIAGNOSIS — C884 Extranodal marginal zone B-cell lymphoma of mucosa-associated lymphoid tissue [MALT-lymphoma]: Secondary | ICD-10-CM

## 2020-02-19 DIAGNOSIS — E278 Other specified disorders of adrenal gland: Secondary | ICD-10-CM | POA: Diagnosis not present

## 2020-02-19 DIAGNOSIS — Z833 Family history of diabetes mellitus: Secondary | ICD-10-CM | POA: Insufficient documentation

## 2020-02-19 DIAGNOSIS — E119 Type 2 diabetes mellitus without complications: Secondary | ICD-10-CM | POA: Insufficient documentation

## 2020-02-19 DIAGNOSIS — Z87891 Personal history of nicotine dependence: Secondary | ICD-10-CM | POA: Insufficient documentation

## 2020-02-19 DIAGNOSIS — I700 Atherosclerosis of aorta: Secondary | ICD-10-CM | POA: Insufficient documentation

## 2020-02-19 DIAGNOSIS — H8110 Benign paroxysmal vertigo, unspecified ear: Secondary | ICD-10-CM | POA: Insufficient documentation

## 2020-02-19 DIAGNOSIS — Z8249 Family history of ischemic heart disease and other diseases of the circulatory system: Secondary | ICD-10-CM | POA: Diagnosis not present

## 2020-02-19 DIAGNOSIS — J449 Chronic obstructive pulmonary disease, unspecified: Secondary | ICD-10-CM | POA: Diagnosis not present

## 2020-02-19 DIAGNOSIS — Z79899 Other long term (current) drug therapy: Secondary | ICD-10-CM | POA: Insufficient documentation

## 2020-02-19 LAB — CMP (CANCER CENTER ONLY)
ALT: 30 U/L (ref 0–44)
AST: 23 U/L (ref 15–41)
Albumin: 3.8 g/dL (ref 3.5–5.0)
Alkaline Phosphatase: 92 U/L (ref 38–126)
Anion gap: 6 (ref 5–15)
BUN: 18 mg/dL (ref 6–20)
CO2: 28 mmol/L (ref 22–32)
Calcium: 9.2 mg/dL (ref 8.9–10.3)
Chloride: 103 mmol/L (ref 98–111)
Creatinine: 1.15 mg/dL (ref 0.61–1.24)
GFR, Est AFR Am: >60 mL/min (ref >60)
GFR, Estimated: >60 mL/min (ref >60)
Glucose, Bld: 261 mg/dL — ABNORMAL HIGH (ref 70–99)
Potassium: 4.9 mmol/L (ref 3.5–5.1)
Sodium: 137 mmol/L (ref 135–145)
Total Bilirubin: 0.4 mg/dL (ref 0.3–1.2)
Total Protein: 7.4 g/dL (ref 6.5–8.1)

## 2020-02-19 LAB — CBC WITH DIFFERENTIAL/PLATELET
Abs Immature Granulocytes: 0.04 10*3/uL (ref 0.00–0.07)
Basophils Absolute: 0.1 10*3/uL (ref 0.0–0.1)
Basophils Relative: 1 %
Eosinophils Absolute: 0.1 10*3/uL (ref 0.0–0.5)
Eosinophils Relative: 1 %
HCT: 45.2 % (ref 39.0–52.0)
Hemoglobin: 15 g/dL (ref 13.0–17.0)
Immature Granulocytes: 0 %
Lymphocytes Relative: 11 %
Lymphs Abs: 1.1 10*3/uL (ref 0.7–4.0)
MCH: 30.6 pg (ref 26.0–34.0)
MCHC: 33.2 g/dL (ref 30.0–36.0)
MCV: 92.2 fL (ref 80.0–100.0)
Monocytes Absolute: 0.6 10*3/uL (ref 0.1–1.0)
Monocytes Relative: 6 %
Neutro Abs: 8.3 10*3/uL — ABNORMAL HIGH (ref 1.7–7.7)
Neutrophils Relative %: 81 %
Platelets: 252 10*3/uL (ref 150–400)
RBC: 4.9 MIL/uL (ref 4.22–5.81)
RDW: 13 % (ref 11.5–15.5)
WBC: 10.2 10*3/uL (ref 4.0–10.5)
nRBC: 0 % (ref 0.0–0.2)

## 2020-02-19 LAB — LACTATE DEHYDROGENASE: LDH: 160 U/L (ref 98–192)

## 2020-02-19 NOTE — Progress Notes (Signed)
HEMATOLOGY/ONCOLOGY CONSULTATION NOTE  Date of Service: 02/19/2020  Patient Care Team: Leeroy Cha, MD as PCP - General (Internal Medicine)  CHIEF COMPLAINTS/PURPOSE OF CONSULTATION:  MALT Lymphoma  HISTORY OF PRESENTING ILLNESS:   Jose Harvey is a wonderful 59 y.o. 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 Pt is accompanied today by his sister. The pt reports that he is doing well overall.  The pt reports that he was a long-term smoker and due to that he has been a participant in a lung cancer screening program. While doing routine monitoring small lung nodules were first noticed in 2017. They did not feel that action needed to be taken at that time so they simply continued monitoring him. A significant change was noticed within the last 6 months. Pt denies any fevers, chills, night sweats, SOB during this time. Pt had a Thoracoscopy with Middle Lobectomy with Dr. Koleen Nimrod on 05/08/2019. He currently feels well and denies any of the aforementioned symptoms except an increase in SOB, noticed after surgery.   Pt quit smoking about 10 years ago. He was diagnosed with COPD in 2018 and given an inhaler. He did not notice a difference with the inhaler so he has since stopped using it. Pt has not had any issues with vertigo in sometime and his diabetes has been well controlled. Pt has a history of Prostate Cancer and had it resected in January of 2019. Pt also had a cholecystectomy, which they decided on due to activity seen on 2017 PET scan. He denies any issues resulting from his gallbladder removal. Diabetes and heart disease runs in his family. His father had prostate cancer and heart disease as well.   Pt will return to work in Spring. There is dust and floating debris at his job but he is not around it very often. Pt has stayed up to date with his annual flu vaccine as well as his pneumonia vaccines.   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 Masslike area of ground-glass straddles the minor fissure with septal thickening and fissural retraction. While the lesion has increased in size minimally from 09/30/2018, there is clear enlargement from baseline examination on 02/16/2016. Therefore, lesion is characterized as worrisome for adenocarcinoma, Lung-RADS 4B, suspicious. Additional imaging evaluation or consultation with Pulmonology or Thoracic Surgery recommended. These results will be called to the ordering clinician or representative by the Radiologist Assistant, and communication documented in the PACS or zVision Dashboard. 2. Liver hemangiomas and left adrenal adenoma, best characterized on 03/03/2016. 3. Aortic atherosclerosis (ICD10-170.0). Coronary artery calcification."  Pt has had PET/CT ([id number]) completed on 02/25/2016 with results revealing "Dominant 8 mm right lower lobe pulmonary nodule shows no significant FDG uptake. Other scattered less than 5 mm bilateral pulmonary nodules also show no FDG uptake but are too small to characterize by PET. Recommend followup by chest CT in 6 months. (please use the following order, "CT CHEST LCS NODULE FOLLOW-UP W/O CM") Right middle lobe ground-glass opacity shows low-grade FDG uptake, and differential diagnosis includes inflammatory or infectious processes and low-grade adenocarcinoma. Recommend followup by chest CT in 6 months.  Multiple low-attenuation liver  masses show no hypermetabolic activity compared to normal hepatic parenchyma. These remain indeterminate and cannot be characterized without IV contrast.  Abdomen MRI without and with contrast recommended for further evaluation. 2.5 cm left adrenal mass shows mild FDG uptake, and remains indeterminate. Differential diagnosis includes atypical adenoma, metastasis, pheochromocytoma, or less likely adrenal cortical carcinoma. This can also be further evaluated with abdomen MRI without and with contrast."  Most recent lab results (05/12/2019) of CBC is as follows: all values are WNL except for RBC at 3.62, Hgb at 11.2, HCT at 34.2.  On review of systems, pt reports increased SOB and denies testicular pain/swelling, leg swelling, fevers, chills, night sweats and any other symptoms.   On PMHx the pt reports COPD with emphysema, Benign Positional Vertigo, Diabetes, Prostate Cancer, Prostatectomy (2019), Cholecystectomy. On Social Hx the pt reports he is a former smoker who quit 10 years ago On Family Hx the pt reports that his father had Prostate Cancer and Heart Disease, additional family history of Heart Disease and Diabetes.  INTERVAL HISTORY:  Jose Harvey is a wonderful 59 y.o. 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 [name] last visit with [place] was on 11/04/2019. The pt reports that he is doing well overall.  The pt reports he began to notice a nodule in the right side of his neck about a month ago. Pt denies any constitutional symptoms or other new bumps. He does have some dental issues that he is in the process of taking care of.   Lab results today (02/19/20) of CBC w/diff and CMP is as follows: all values are WNL except for Neutro Abs at 8.3K, Glucose at 261. 02/19/2020 LDH at 160  On review of systems, pt reports new right neck lump, dry throat and denies abdominal pain, leg swelling, SOB, chest pain, fevers, chills, night sweats, unexpected weight loss and any other symptoms.   MEDICAL HISTORY:  Past Medical History:  Diagnosis Date  . Adrenal adenoma, left 03/2017   noted on CT  . Allergic rhinitis, cause  unspecified   . Anxiety state, unspecified   . Aortic atherosclerosis ([place]) 03/2017   noted on CT  . BPV (benign positional vertigo)   . COPD (chronic obstructive pulmonary disease) with emphysema (HCC)    mild followed by [facility]   . Coronary artery disease   . Depressive disorder, not elsewhere classified   . Dyspnea    history of  . ED (erectile dysfunction)   . Gallbladder polyp 03/2016   noted on CT  . Hepatic hemangioma 03/2017   noted on CT  . History of kidney stones    hx of years ago   . Indigestion   . 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 Mixed hyperlipidemia   . Prostate cancer ([place])   . 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 Type I (juvenile type) diabetes mellitus without mention of complication, not stated as uncontrolled    type I - followed by Dr Buddy Duty   . Vesicoureteral reflux     SURGICAL HISTORY: Past Surgical History:  Procedure Laterality Date  . CHOLECYSTECTOMY N/A 05/17/2016   Procedure: LAPAROSCOPIC CHOLECYSTECTOMY;  Surgeon: Clovis Riley, MD;  Location: [facility];  Service: General;  Laterality: N/A;  . cyst removed from throat     . dental surgeries     . LYMPHADENECTOMY Bilateral 07/02/2017   Procedure: BILATERAL LYMPHADENECTOMY;  Surgeon: Link Snuffer  D III, MD;  Location: [facility];  Service: Urology;  Laterality: Bilateral;  . PROSTATE BIOPSY    . ROBOT ASSISTED LAPAROSCOPIC RADICAL PROSTATECTOMY N/A 07/02/2017   Procedure: XI ROBOTIC ASSISTED LAPAROSCOPIC RADICAL PROSTATECTOMY;  Surgeon: Lucas Mallow, MD;  Location: [facility];  Service: Urology;  Laterality: N/A;  . TONSILLECTOMY    . VIDEO ASSISTED THORACOSCOPY (VATS)/ LOBECTOMY Right 05/08/2019   Procedure: Right VIDEO ASSISTED THORACOSCOPY with   MIDDLE LOBECTOMY and Enbloc portion of Upper Lobe with Node dissection, Intercostal Nerve Block;  Surgeon: Melrose Nakayama, MD;  Location: [facility];  Service: Thoracic;  Laterality: Right;  . WISDOM TOOTH EXTRACTION      SOCIAL HISTORY: Social History   Socioeconomic History  . Marital status: Divorced    Spouse name: Not on file  . Number of children: 2  . Years of education: Not on file  . 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 Smoking status: Former Smoker    Packs/day: 1.50    Years: 27.00    Pack years: 40.50    Types: Cigarettes    Quit date: 06/05/2008    Years since quitting: 11.7  . Smokeless tobacco: Former Systems developer    Types: Snuff    Quit date: 2015  Vaping Use  . Vaping Use: Never used  Substance and Sexual Activity  . Alcohol use: Yes    Alcohol/week: 6.0 standard drinks    Types: 6 Cans of beer per week    Comment: occasional 6pk per week  . Drug use: No  . Sexual activity: Not Currently  Other Topics Concern  . Not on file  Social History Narrative   Patient divorced. Patient has two grown daughters but neither lives locally.   Social Determinants of Health   Financial Resource Strain:   . Difficulty of Paying Living Expenses: Not on file  Food Insecurity:   . Worried About Charity fundraiser in the Last Year: Not on file  . Ran Out of Food in the Last Year: Not on file  Transportation Needs:   . Lack of Transportation (Medical): Not on file  . Lack of Transportation (Non-Medical): Not on file  Physical Activity:   . Days of Exercise per Week: Not on file  . Minutes of Exercise per Session: Not on file  Stress:   . Feeling of Stress : Not on file  Social Connections:   . Frequency of Communication with Friends and Family: Not on file  . Frequency of Social Gatherings with Friends and Family: Not on file  . Attends Religious Services: Not on file  . Active Member of Clubs or Organizations: Not on file  . Attends Archivist Meetings: Not on file  . Marital Status: Not on file  Intimate Partner Violence:   . Fear  of Current or Ex-Partner: Not on file  . Emotionally Abused: Not on file  . Physically Abused: Not on file  . Sexually Abused: Not on file    FAMILY HISTORY: Family History  Problem Relation Age of Onset  . Diabetes type I Father   . Heart attack Father   . Hypertension Father   . Emphysema Father   . CAD Father   . Colon cancer Maternal Grandfather   . Emphysema Paternal Grandfather   . Diabetes type I Paternal Grandfather   . Kidney disease Neg Hx   . Liver disease Neg  Hx   . Breast cancer Neg Hx   . Prostate cancer Neg Hx     ALLERGIES:  is allergic to crestor [rosuvastatin], lipitor [atorvastatin], other, and zocor [simvastatin].  MEDICATIONS:  Current Outpatient Medications  Medication Sig Dispense Refill  . aspirin 81 MG tablet Take 1 tablet (81 mg total) by mouth daily. 30 tablet   . cetirizine (KLS ALLER-TEC) 10 MG tablet Take 5 mg by mouth daily.    . Coenzyme Q10 (COQ10) 100 MG CAPS Take 100 mg by mouth daily.    . Continuous Blood Gluc Sensor (FREESTYLE LIBRE 14 DAY SENSOR) MISC AS DIRECTED CHANGE SENSOR EVERY 14 DAYS 28    . Insulin Human (INSULIN PUMP) SOLN Inject into the skin continuous. NOVOLOG 100 UNIT/ML VIAL Basal rate per patient is as follows: at 12 midnite- .6 , at 0500am-.8 then at 0800am-.6    . lisinopril (PRINIVIL,ZESTRIL) 5 MG tablet Take 5 mg by mouth daily.     . Multiple Vitamins-Minerals (ICAPS AREDS 2) CAPS Take 1 capsule by mouth daily.    Marland Kitchen NOVOLOG 100 UNIT/ML injection USE AS DIRECTED VIA INSULIN PUMP. MAX 66 UNITS DAILY 90    . pantoprazole (PROTONIX) 40 MG tablet Take 40 mg by mouth daily.    . rosuvastatin (CRESTOR) 5 MG tablet Take 1 tablet (5 mg total) by mouth daily. 90 tablet 3  . sertraline (ZOLOFT) 100 MG tablet Take 100 mg by mouth daily.   0   No current facility-administered medications for this visit.    REVIEW OF SYSTEMS:   A 10+ POINT REVIEW OF SYSTEMS WAS OBTAINED including neurology, dermatology, psychiatry, cardiac,  respiratory, lymph, extremities, GI, GU, Musculoskeletal, constitutional, breasts, reproductive, HEENT.  All pertinent positives are noted in the HPI.  All others are negative.   PHYSICAL EXAMINATION: ECOG PERFORMANCE STATUS: 0 - Asymptomatic  . Vitals:   02/19/20 1505  BP: 136/72  Pulse: 87  Resp: 18  Temp: (!) 96.4 F (35.8 C)  SpO2: 99%   Filed Weights   02/19/20 1505  Weight: 182 lb 12.8 oz (82.9 kg)   .Body mass index is 25.5 kg/m.  GENERAL:alert, in no acute distress and comfortable SKIN: no acute rashes, no significant lesions EYES: conjunctiva are pink and non-injected, sclera anicteric OROPHARYNX: MMM, no exudates, no oropharyngeal erythema or ulceration NECK: supple, no JVD LYMPH:  no palpable lymphadenopathy in the axillary or inguinal regions. 3 cm right cervical lymph node, few small cervical and supraclavicular lymph nodes.  LUNGS: clear to auscultation b/l with normal respiratory effort HEART: regular rate & rhythm ABDOMEN:  normoactive bowel sounds , non tender, not distended. No palpable hepatosplenomegaly.  Extremity: no pedal edema PSYCH: alert & oriented x 3 with fluent speech NEURO: no focal motor/sensory deficits  LABORATORY DATA:  I have reviewed the data as listed  . CBC Latest Ref Rng & Units 02/19/2020 10/31/2019 05/12/2019  WBC 4.0 - 10.5 K/uL 10.2 6.5 9.4  Hemoglobin 13.0 - 17.0 g/dL 15.0 14.4 11.2(L)  Hematocrit 39 - 52 % 45.2 43.6 34.2(L)  Platelets 150 - 400 K/uL 252 215 169    . CMP Latest Ref Rng & Units 02/19/2020 10/31/2019 05/10/2019  Glucose 70 - 99 mg/dL 261(H) 182(H) 202(H)  BUN 6 - 20 mg/dL 18 26(H) 14  Creatinine 0.61 - 1.24 mg/dL 1.15 1.12 0.78  Sodium 135 - 145 mmol/L 137 138 137  Potassium 3.5 - 5.1 mmol/L 4.9 5.0 3.9  Chloride 98 - 111 mmol/L 103 103 105  CO2  22 - 32 mmol/L 28 24 27   Calcium 8.9 - 10.3 mg/dL 9.2 9.0 8.0(L)  Total Protein 6.5 - 8.1 g/dL 7.4 7.0 5.1(L)  Total Bilirubin 0.3 - 1.2 mg/dL 0.4 0.5 0.7  Alkaline  Phos 38 - 126 U/L 92 92 55  AST 15 - 41 U/L 23 20 19   ALT 0 - 44 U/L 30 25 19    05/08/2019 Surgical Pathology [id number]    05/08/2019 Surgical Pathology [phone]):    RADIOGRAPHIC STUDIES: I have personally reviewed the radiological images as listed and agreed with the findings in the report. No results found.  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 Masslike area of ground-glass straddles the minor fissure with septal thickening and fissural retraction. While the lesion has increased in size minimally from 09/30/2018, there is clear enlargement from baseline examination on 02/16/2016. Therefore, lesion is characterized as worrisome for adenocarcinoma, Lung-RADS 4B, suspicious. -02/25/2016 PET/CT ([id number]) revealed "Dominant 8 mm right lower lobe pulmonary nodule shows no significant FDG uptake. Other scattered less than 5 mm bilateral pulmonary nodules also show no FDG uptake but are too small to characterize by PET. Right middle lobe ground-glass opacity shows low-grade FDG uptake, and differential diagnosis includes inflammatory or infectious processes and low-grade adenocarcinoma. Multiple low-attenuation liver masses show no hypermetabolic activity compared to normal hepatic parenchyma. These remain indeterminate and cannot be characterized without IV contrast. Abdomen MRI without and with contrast recommended for further evaluation. 2.5 cm left adrenal mass shows  mild FDG uptake, and remains indeterminate. Differential diagnosis includes atypical adenoma, metastasis, pheochromocytoma, or less likely adrenal cortical carcinoma."  PLAN: -Discussed pt labwork today, 02/19/20; blood counts look good, Glucose is very elevated, other blood chemistries are nml, LDH is WNL.  -Advised pt that we would move to treat if lymphoma is causing consitutional symptoms, bulky disease, threatened end organs, or cytopenias.  -Some clinical progression of lymphoma but labs and symptoms remain steady. No indication for treatment at this time. -Recommend pt stay up to date with annual flu vaccine and pneumonia vaccines -Will get CT Neck/Chest/Abd/Pel in 2 months -Will see back in 11 weeks    FOLLOW UP: CT neck/chest/abd/pelvis in 2 months F/u as per scheduled appointments on 12/1   The total time spent in the appt was 20 minutes and more than 50% was on counseling and direct patient cares.  All of the patient's questions were answered with apparent satisfaction. The patient knows to call the clinic with any problems, questions or concerns.    Sullivan Lone MD [place] AAHIVMS [facility] Hematology/Oncology Physician [facility]  (Office):       [phone] (Work cell):  [phone] (Fax):           [phone]  02/19/2020 5:01 PM  I, Yevette Edwards, am acting as a scribe for Dr. Sullivan Lone.   .I have reviewed the above documentation for accuracy and completeness, and I agree with the above. Brunetta Genera MD

## 2020-04-27 DIAGNOSIS — Z13220 Encounter for screening for lipoid disorders: Secondary | ICD-10-CM | POA: Diagnosis not present

## 2020-04-27 DIAGNOSIS — Z9641 Presence of insulin pump (external) (internal): Secondary | ICD-10-CM | POA: Diagnosis not present

## 2020-04-27 DIAGNOSIS — Z794 Long term (current) use of insulin: Secondary | ICD-10-CM | POA: Diagnosis not present

## 2020-04-27 DIAGNOSIS — Z0000 Encounter for general adult medical examination without abnormal findings: Secondary | ICD-10-CM | POA: Diagnosis not present

## 2020-04-27 DIAGNOSIS — R946 Abnormal results of thyroid function studies: Secondary | ICD-10-CM | POA: Diagnosis not present

## 2020-04-27 DIAGNOSIS — Z23 Encounter for immunization: Secondary | ICD-10-CM | POA: Diagnosis not present

## 2020-04-27 DIAGNOSIS — E1065 Type 1 diabetes mellitus with hyperglycemia: Secondary | ICD-10-CM | POA: Diagnosis not present

## 2020-04-28 ENCOUNTER — Other Ambulatory Visit: Payer: Self-pay | Admitting: *Deleted

## 2020-05-03 ENCOUNTER — Ambulatory Visit (HOSPITAL_COMMUNITY): Payer: BC Managed Care – PPO

## 2020-05-03 ENCOUNTER — Encounter (HOSPITAL_COMMUNITY): Payer: Self-pay

## 2020-05-03 ENCOUNTER — Other Ambulatory Visit: Payer: Self-pay

## 2020-05-03 ENCOUNTER — Ambulatory Visit (HOSPITAL_COMMUNITY)
Admission: RE | Admit: 2020-05-03 | Discharge: 2020-05-03 | Disposition: A | Discharge status: Home / Self Care | Payer: BC Managed Care – PPO | Source: Ambulatory Visit | Attending: Hematology | Admitting: Hematology

## 2020-05-03 ENCOUNTER — Telehealth: Payer: Self-pay | Admitting: *Deleted

## 2020-05-03 DIAGNOSIS — C884 Extranodal marginal zone B-cell lymphoma of mucosa-associated lymphoid tissue [MALT-lymphoma]: Secondary | ICD-10-CM | POA: Diagnosis not present

## 2020-05-03 DIAGNOSIS — I700 Atherosclerosis of aorta: Secondary | ICD-10-CM | POA: Diagnosis not present

## 2020-05-03 DIAGNOSIS — I6523 Occlusion and stenosis of bilateral carotid arteries: Secondary | ICD-10-CM | POA: Diagnosis not present

## 2020-05-03 DIAGNOSIS — K7689 Other specified diseases of liver: Secondary | ICD-10-CM | POA: Diagnosis not present

## 2020-05-03 DIAGNOSIS — I2510 Atherosclerotic heart disease of native coronary artery without angina pectoris: Secondary | ICD-10-CM | POA: Diagnosis not present

## 2020-05-03 DIAGNOSIS — D3502 Benign neoplasm of left adrenal gland: Secondary | ICD-10-CM | POA: Diagnosis not present

## 2020-05-03 DIAGNOSIS — D1803 Hemangioma of intra-abdominal structures: Secondary | ICD-10-CM | POA: Diagnosis not present

## 2020-05-03 DIAGNOSIS — Z8572 Personal history of non-Hodgkin lymphomas: Secondary | ICD-10-CM | POA: Diagnosis not present

## 2020-05-03 DIAGNOSIS — I708 Atherosclerosis of other arteries: Secondary | ICD-10-CM | POA: Diagnosis not present

## 2020-05-03 DIAGNOSIS — E041 Nontoxic single thyroid nodule: Secondary | ICD-10-CM | POA: Diagnosis not present

## 2020-05-03 LAB — POCT I-STAT CREATININE: Creatinine, Ser: 1 mg/dL (ref 0.61–1.24)

## 2020-05-03 MED ORDER — IOHEXOL 300 MG/ML  SOLN
100.0000 mL | Freq: Once | INTRAMUSCULAR | Status: AC | PRN
  Administered 2020-05-03: 100 mL via INTRAVENOUS

## 2020-05-03 NOTE — Telephone Encounter (Signed)
Received call report from [facility]. Patient had CT CAP this morning and CT neck.  CT Neck IMPRESSION: 1.9 x 1.6 cm enhancing right base of tongue mass. Associated right level 2 lymphadenopathy, most notably a 3.1 x 2.3 cm cystic/necrotic node. 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 There are also right level 2/3 lymph nodes more inferiorly, which measure subcentimeter in short axis, but are asymmetrically prominent and also suspicious. ENT consultation is recommended and direct tissue sampling should be considered. No pathologically enlarged lymph nodes identified elsewhere within the neck. Unchanged 1.6 cm left thyroid lobe nodule. If not already performed, dedicated thyroid ultrasound is recommended for further evaluation. Moderate paranasal sinus mucosal thickening, as described.   Dr. Irene Limbo informed

## 2020-05-05 ENCOUNTER — Other Ambulatory Visit: Payer: Self-pay

## 2020-05-05 ENCOUNTER — Inpatient Hospital Stay: Payer: BC Managed Care – PPO | Attending: Hematology

## 2020-05-05 ENCOUNTER — Inpatient Hospital Stay (HOSPITAL_BASED_OUTPATIENT_CLINIC_OR_DEPARTMENT_OTHER): Payer: BC Managed Care – PPO | Admitting: Hematology

## 2020-05-05 VITALS — BP 117/63 | HR 100 | Temp 97.2°F | Resp 18 | Ht 71.0 in | Wt 183.6 lb

## 2020-05-05 DIAGNOSIS — I700 Atherosclerosis of aorta: Secondary | ICD-10-CM | POA: Insufficient documentation

## 2020-05-05 DIAGNOSIS — C01 Malignant neoplasm of base of tongue: Secondary | ICD-10-CM | POA: Diagnosis not present

## 2020-05-05 DIAGNOSIS — Z836 Family history of other diseases of the respiratory system: Secondary | ICD-10-CM | POA: Insufficient documentation

## 2020-05-05 DIAGNOSIS — Z7289 Other problems related to lifestyle: Secondary | ICD-10-CM | POA: Insufficient documentation

## 2020-05-05 DIAGNOSIS — I2510 Atherosclerotic heart disease of native coronary artery without angina pectoris: Secondary | ICD-10-CM | POA: Insufficient documentation

## 2020-05-05 DIAGNOSIS — Z800 Family history of malignant neoplasm of digestive organs: Secondary | ICD-10-CM | POA: Diagnosis not present

## 2020-05-05 DIAGNOSIS — D1803 Hemangioma of intra-abdominal structures: Secondary | ICD-10-CM | POA: Diagnosis not present

## 2020-05-05 DIAGNOSIS — D3500 Benign neoplasm of unspecified adrenal gland: Secondary | ICD-10-CM | POA: Insufficient documentation

## 2020-05-05 DIAGNOSIS — Z8042 Family history of malignant neoplasm of prostate: Secondary | ICD-10-CM | POA: Insufficient documentation

## 2020-05-05 DIAGNOSIS — Z833 Family history of diabetes mellitus: Secondary | ICD-10-CM | POA: Insufficient documentation

## 2020-05-05 DIAGNOSIS — R590 Localized enlarged lymph nodes: Secondary | ICD-10-CM | POA: Diagnosis not present

## 2020-05-05 DIAGNOSIS — J449 Chronic obstructive pulmonary disease, unspecified: Secondary | ICD-10-CM | POA: Insufficient documentation

## 2020-05-05 DIAGNOSIS — C884 Extranodal marginal zone B-cell lymphoma of mucosa-associated lymphoid tissue [MALT-lymphoma]: Secondary | ICD-10-CM | POA: Diagnosis not present

## 2020-05-05 DIAGNOSIS — Z8546 Personal history of malignant neoplasm of prostate: Secondary | ICD-10-CM | POA: Diagnosis not present

## 2020-05-05 DIAGNOSIS — Z8249 Family history of ischemic heart disease and other diseases of the circulatory system: Secondary | ICD-10-CM | POA: Diagnosis not present

## 2020-05-05 DIAGNOSIS — E119 Type 2 diabetes mellitus without complications: Secondary | ICD-10-CM | POA: Diagnosis not present

## 2020-05-05 DIAGNOSIS — Z79899 Other long term (current) drug therapy: Secondary | ICD-10-CM | POA: Insufficient documentation

## 2020-05-05 DIAGNOSIS — Z87891 Personal history of nicotine dependence: Secondary | ICD-10-CM | POA: Insufficient documentation

## 2020-05-05 LAB — CMP (CANCER CENTER ONLY)
ALT: 21 U/L (ref 0–44)
AST: 18 U/L (ref 15–41)
Albumin: 3.9 g/dL (ref 3.5–5.0)
Alkaline Phosphatase: 101 U/L (ref 38–126)
Anion gap: 7 (ref 5–15)
BUN: 18 mg/dL (ref 6–20)
CO2: 25 mmol/L (ref 22–32)
Calcium: 9.5 mg/dL (ref 8.9–10.3)
Chloride: 104 mmol/L (ref 98–111)
Creatinine: 1.07 mg/dL (ref 0.61–1.24)
GFR, Estimated: >60 mL/min (ref >60)
Glucose, Bld: 156 mg/dL — ABNORMAL HIGH (ref 70–99)
Potassium: 3.9 mmol/L (ref 3.5–5.1)
Sodium: 136 mmol/L (ref 135–145)
Total Bilirubin: 0.4 mg/dL (ref 0.3–1.2)
Total Protein: 7.6 g/dL (ref 6.5–8.1)

## 2020-05-05 LAB — CBC WITH DIFFERENTIAL/PLATELET
Abs Immature Granulocytes: 0.04 10*3/uL (ref 0.00–0.07)
Basophils Absolute: 0.1 10*3/uL (ref 0.0–0.1)
Basophils Relative: 1 %
Eosinophils Absolute: 0.6 10*3/uL — ABNORMAL HIGH (ref 0.0–0.5)
Eosinophils Relative: 5 %
HCT: 44.3 % (ref 39.0–52.0)
Hemoglobin: 14.6 g/dL (ref 13.0–17.0)
Immature Granulocytes: 0 %
Lymphocytes Relative: 18 %
Lymphs Abs: 2.1 10*3/uL (ref 0.7–4.0)
MCH: 30.2 pg (ref 26.0–34.0)
MCHC: 33 g/dL (ref 30.0–36.0)
MCV: 91.5 fL (ref 80.0–100.0)
Monocytes Absolute: 0.9 10*3/uL (ref 0.1–1.0)
Monocytes Relative: 8 %
Neutro Abs: 8.1 10*3/uL — ABNORMAL HIGH (ref 1.7–7.7)
Neutrophils Relative %: 68 %
Platelets: 243 10*3/uL (ref 150–400)
RBC: 4.84 MIL/uL (ref 4.22–5.81)
RDW: 12.9 % (ref 11.5–15.5)
WBC: 11.8 10*3/uL — ABNORMAL HIGH (ref 4.0–10.5)
nRBC: 0 % (ref 0.0–0.2)

## 2020-05-05 LAB — LACTATE DEHYDROGENASE: LDH: 158 U/L (ref 98–192)

## 2020-05-05 NOTE — Progress Notes (Signed)
HEMATOLOGY/ONCOLOGY CONSULTATION NOTE  Date of Service: 05/05/2020  Patient Care Team: Leeroy Cha, MD as PCP - General (Internal Medicine)  CHIEF COMPLAINTS/PURPOSE OF CONSULTATION:  MALT Lymphoma  HISTORY OF PRESENTING ILLNESS:   Jose Harvey is a wonderful 59 y.o. 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 Pt is accompanied today by his sister. The pt reports that he is doing well overall.  The pt reports that he was a long-term smoker and due to that he has been a participant in a lung cancer screening program. While doing routine monitoring small lung nodules were first noticed in 2017. They did not feel that action needed to be taken at that time so they simply continued monitoring him. A significant change was noticed within the last 6 months. Pt denies any fevers, chills, night sweats, SOB during this time. Pt had a Thoracoscopy with Middle Lobectomy with Dr. Koleen Nimrod on 05/08/2019. He currently feels well and denies any of the aforementioned symptoms except an increase in SOB, noticed after surgery.   Pt quit smoking about 10 years ago. He was diagnosed with COPD in 2018 and given an inhaler. He did not notice a difference with the inhaler so he has since stopped using it. Pt has not had any issues with vertigo in sometime and his diabetes has been well controlled. Pt has a history of Prostate Cancer and had it resected in January of 2019. Pt also had a cholecystectomy, which they decided on due to activity seen on 2017 PET scan. He denies any issues resulting from his gallbladder removal. Diabetes and heart disease runs in his family. His father had prostate cancer and heart disease as well.   Pt will return to work in Spring. There is dust and floating debris at his job but he is not around it very often. Pt has stayed up to date with his annual flu vaccine as well as his pneumonia vaccines.   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 Masslike area of ground-glass straddles the minor fissure with septal thickening and fissural retraction. While the lesion has increased in size minimally from 09/30/2018, there is clear enlargement from baseline examination on 02/16/2016. Therefore, lesion is characterized as worrisome for adenocarcinoma, Lung-RADS 4B, suspicious. Additional imaging evaluation or consultation with Pulmonology or Thoracic Surgery recommended. These results will be called to the ordering clinician or representative by the Radiologist Assistant, and communication documented in the PACS or [facility] Dashboard. 2. Liver hemangiomas and left adrenal adenoma, best characterized on 03/03/2016. 3. Aortic atherosclerosis (ICD10-170.0). Coronary artery calcification."  Pt has had PET/CT ([id number]) completed on 02/25/2016 with results revealing "Dominant 8 mm right lower lobe pulmonary nodule shows no significant FDG uptake. Other scattered less than 5 mm bilateral pulmonary nodules also show no FDG uptake but are too small to characterize by PET. Recommend followup by chest CT in 6 months. (please use the following order, "CT CHEST LCS NODULE FOLLOW-UP W/O CM") Right middle lobe ground-glass opacity shows low-grade FDG uptake, and differential diagnosis includes inflammatory or infectious processes and low-grade adenocarcinoma. Recommend followup by chest CT in 6 months.  Multiple low-attenuation liver  masses show no hypermetabolic activity compared to normal hepatic parenchyma. These remain indeterminate and cannot be characterized without IV contrast.  Abdomen MRI without and with contrast recommended for further evaluation. 2.5 cm left adrenal mass shows mild FDG uptake, and remains indeterminate. Differential diagnosis includes atypical adenoma, metastasis, pheochromocytoma, or less likely adrenal cortical carcinoma. This can also be further evaluated with abdomen MRI without and with contrast."  Most recent lab results (05/12/2019) of CBC is as follows: all values are WNL except for RBC at 3.62, Hgb at 11.2, HCT at 34.2.  On review of systems, pt reports increased SOB and denies testicular pain/swelling, leg swelling, fevers, chills, night sweats and any other symptoms.   On PMHx the pt reports COPD with emphysema, Benign Positional Vertigo, Diabetes, Prostate Cancer, Prostatectomy (2019), Cholecystectomy. On Social Hx the pt reports he is a former smoker who quit 10 years ago On Family Hx the pt reports that his father had Prostate Cancer and Heart Disease, additional family history of Heart Disease and Diabetes.  INTERVAL HISTORY:  [name] is a wonderful 59 y.o. 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 [name] last visit with [place] was on 02/19/2020. The pt reports that he is doing well overall.  The pt reports that he has felt well in the interim and denies any new symptoms. Pt smoked 1-2 ppd on and off for nearly 20 years. He also used chew tobacco for 5 years, around the time he first began smoking cigarettes. He drinks about 60-45 alcoholic beverages per week.   Of note since the patient's last visit, pt has had CT Soft Tissue Neck ([id number]) completed on 05/03/2020 with results revealing "1.9 x 1.6 cm enhancing right base of tongue mass. Associated right level 2 lymphadenopathy, most notably a 3.1 x 2.3 cm cystic/necrotic node. 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 There are also right level 2/3 lymph nodes more inferiorly, which measure  subcentimeter in short axis, but are asymmetrically prominent and also suspicious. No pathologically enlarged lymph nodes identified elsewhere within the neck. Unchanged 1.6 cm left thyroid lobe nodule. Moderate paranasal sinus mucosal thickening, as described."  Pt has had CT C/A/P ([id number]) completed on 05/03/2020 with results revealing "1. No findings to suggest recurrent lymphoma in the chest, abdomen or pelvis. 2. Stable ground-glass attenuation nodule in the left upper lobe, favored to be benign. Continued attention on follow-up studies is recommended. 3. Stable small left adrenal nodule, incompletely characterize, but favored to represent a benign lesions such as an adrenal adenoma. 4. Multiple hepatic hemangiomas redemonstrated. 5. Aortic atherosclerosis, in addition to left main and 2 vessel coronary artery disease. "  Lab results today (05/05/20) of CBC w/diff and CMP is as follows: all values are WNL except for WBC at 11.8K, Neutro Abs at 8.1K, Eos Abs at 0.6K, Glucose at 156. 05/05/2020 LDH at 158  On review of systems, pt reports congestion of the head and chest and denies dysphagia, vocal changes, SOB, abdominal pain, back pain, new lumps/bumps, fevers, chills, night sweats, weight loss and any other symptoms.   MEDICAL HISTORY:  Past Medical History:  Diagnosis Date  . Adrenal adenoma, left 03/2017   noted on CT  . Allergic rhinitis, cause unspecified   . Anxiety state, unspecified   . Aortic atherosclerosis ([place]) 03/2017   noted on CT  . BPV (benign positional vertigo)   . COPD (chronic obstructive pulmonary disease) with emphysema ([place])  mild followed by  Pulmonary   . Coronary artery disease   . Depressive disorder, not elsewhere classified   . Dyspnea    history of  . ED (erectile dysfunction)   . Gallbladder polyp 03/2016   noted on CT  . Hepatic hemangioma 03/2017   noted on CT  . History of kidney stones    hx of years ago   . Indigestion   . 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 Mixed hyperlipidemia   . Prostate cancer ([place])   . 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 Type I (juvenile type) diabetes mellitus without mention of complication, not stated as uncontrolled    type I - followed by Dr Buddy Duty   . Vesicoureteral reflux     SURGICAL HISTORY: Past Surgical History:  Procedure Laterality Date  . CHOLECYSTECTOMY N/A 05/17/2016   Procedure: LAPAROSCOPIC CHOLECYSTECTOMY;  Surgeon: Clovis Riley, MD;  Location: [facility];  Service: General;  Laterality: N/A;  . cyst removed from throat     . dental surgeries     . LYMPHADENECTOMY Bilateral 07/02/2017   Procedure: BILATERAL LYMPHADENECTOMY;  Surgeon: Lucas Mallow, MD;  Location: [facility];  Service: Urology;  Laterality: Bilateral;  . PROSTATE BIOPSY    . ROBOT ASSISTED LAPAROSCOPIC RADICAL PROSTATECTOMY N/A 07/02/2017   Procedure: XI ROBOTIC ASSISTED LAPAROSCOPIC RADICAL PROSTATECTOMY;  Surgeon: Lucas Mallow, MD;  Location: [facility];  Service: Urology;  Laterality: N/A;  . TONSILLECTOMY    . VIDEO ASSISTED THORACOSCOPY (VATS)/ LOBECTOMY Right 05/08/2019   Procedure: Right VIDEO ASSISTED THORACOSCOPY with   MIDDLE LOBECTOMY and En[place]oc portion of Upper Lobe with Node dissection, Intercostal Nerve Block;  Surgeon: Melrose Nakayama, MD;  Location: [facility];  Service: Thoracic;  Laterality: Right;  . WISDOM TOOTH EXTRACTION      SOCIAL HISTORY: Social History   Socioeconomic History  . Marital status: Divorced    Spouse name: Not on file  . Number of children: 2  . Years of education: Not on file  . 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 Smoking status: Former Smoker    Packs/day: 1.50    Years: 27.00    Pack years: 40.50    Types: Cigarettes    Quit date: 06/05/2008    Years since quitting: 11.9  . Smokeless  tobacco: Former Systems developer    Types: Snuff    Quit date: 2015  Vaping Use  . Vaping Use: Never used  Substance and Sexual Activity  . Alcohol use: Yes    Alcohol/week: 6.0 standard drinks    Types: 6 Cans of beer per week    Comment: occasional 6pk per week  . Drug use: No  . Sexual activity: Not Currently  Other Topics Concern  . Not on file  Social History Narrative   Patient divorced. Patient has two grown daughters but neither lives locally.   Social Determinants of Health   Financial Resource Strain:   . Difficulty of Paying Living Expenses: Not on file  Food Insecurity:   . Worried About Charity fundraiser in the Last Year: Not on file  . Ran Out of Food in the Last Year: Not on file  Transportation Needs:   . Lack of Transportation (Medical): Not on file  . Lack of Transportation (Non-Medical): Not on file  Physical Activity:   .  Days of Exercise per Week: Not on file  . Minutes of Exercise per Session: Not on file  Stress:   . Feeling of Stress : Not on file  Social Connections:   . Frequency of Communication with Friends and Family: Not on file  . Frequency of Social Gatherings with Friends and Family: Not on file  . Attends Religious Services: Not on file  . Active Member of Clubs or Organizations: Not on file  . Attends Archivist Meetings: Not on file  . Marital Status: Not on file  Intimate Partner Violence:   . Fear of Current or Ex-Partner: Not on file  . Emotionally Abused: Not on file  . Physically Abused: Not on file  . Sexually Abused: Not on file    FAMILY HISTORY: Family History  Problem Relation Age of Onset  . Diabetes type I Father   . Heart attack Father   . Hypertension Father   . Emphysema Father   . CAD Father   . Colon cancer Maternal Grandfather   . Emphysema Paternal Grandfather   . Diabetes type I Paternal Grandfather   . Kidney disease Neg Hx   . Liver disease Neg Hx   . Breast cancer Neg Hx   . Prostate cancer Neg Hx      ALLERGIES:  is allergic to crestor [rosuvastatin], lipitor [atorvastatin], other, and zocor [simvastatin].  MEDICATIONS:  Current Outpatient Medications  Medication Sig Dispense Refill  . aspirin 81 MG tablet Take 1 tablet (81 mg total) by mouth daily. 30 tablet   . cetirizine (KLS ALLER-TEC) 10 MG tablet Take 5 mg by mouth daily.    . Coenzyme Q10 (COQ10) 100 MG CAPS Take 100 mg by mouth daily.    . Continuous Blood Gluc Sensor (FREESTYLE LIBRE 14 DAY SENSOR) MISC AS DIRECTED CHANGE SENSOR EVERY 14 DAYS 28    . Insulin Human (INSULIN PUMP) SOLN Inject into the skin continuous. NOVOLOG 100 UNIT/ML VIAL Basal rate per patient is as follows: at 12 midnite- .6 , at 0500am-.8 then at 0800am-.6    . lisinopril (PRINIVIL,ZESTRIL) 5 MG tablet Take 5 mg by mouth daily.     . Multiple Vitamins-Minerals (ICAPS AREDS 2) CAPS Take 1 capsule by mouth daily.    Marland Kitchen NOVOLOG 100 UNIT/ML injection USE AS DIRECTED VIA INSULIN PUMP. MAX 66 UNITS DAILY 90    . pantoprazole (PROTONIX) 40 MG tablet Take 40 mg by mouth daily.    . rosuvastatin (CRESTOR) 5 MG tablet Take 1 tablet (5 mg total) by mouth daily. 90 tablet 3  . sertraline (ZOLOFT) 100 MG tablet Take 100 mg by mouth daily.   0   No current facility-administered medications for this visit.    REVIEW OF SYSTEMS:   A 10+ POINT REVIEW OF SYSTEMS WAS OBTAINED including neurology, dermatology, psychiatry, cardiac, respiratory, lymph, extremities, GI, GU, Musculoskeletal, constitutional, breasts, reproductive, HEENT.  All pertinent positives are noted in the HPI.  All others are negative.   PHYSICAL EXAMINATION: ECOG PERFORMANCE STATUS: 0 - Asymptomatic  . Vitals:   05/05/20 1355  BP: 117/63  Pulse: 100  Resp: 18  Temp: (!) 97.2 F (36.2 C)  SpO2: 99%   Filed Weights   05/05/20 1355  Weight: 183 lb 9.6 oz (83.3 kg)   .Body mass index is 25.61 kg/m.  GENERAL:alert, in no acute distress and comfortable SKIN: no acute rashes, no  significant lesions EYES: conjunctiva are pink and non-injected, sclera anicteric OROPHARYNX: MMM, no exudates, no  oropharyngeal erythema or ulceration NECK: supple, no JVD LYMPH:  no palpable lymphadenopathy in the cervical, axillary or inguinal regions  LUNGS: clear to auscultation b/l with normal respiratory effort HEART: regular rate & rhythm ABDOMEN:  normoactive bowel sounds , non tender, not distended. No palpable hepatosplenomegaly.  Extremity: no pedal edema PSYCH: alert & oriented x 3 with fluent speech NEURO: no focal motor/sensory deficits  LABORATORY DATA:  I have reviewed the data as listed  . CBC Latest Ref Rng & Units 05/05/2020 02/19/2020 10/31/2019  WBC 4.0 - 10.5 K/uL 11.8(H) 10.2 6.5  Hemoglobin 13.0 - 17.0 g/dL 14.6 15.0 14.4  Hematocrit 39 - 52 % 44.3 45.2 43.6  Platelets 150 - 400 K/uL 243 252 215    . CMP Latest Ref Rng & Units 05/05/2020 05/03/2020 02/19/2020  Glucose 70 - 99 mg/dL 156(H) - 261(H)  BUN 6 - 20 mg/dL 18 - 18  Creatinine 0.61 - 1.24 mg/dL 1.07 1.00 1.15  Sodium 135 - 145 mmol/L 136 - 137  Potassium 3.5 - 5.1 mmol/L 3.9 - 4.9  Chloride 98 - 111 mmol/L 104 - 103  CO2 22 - 32 mmol/L 25 - 28  Calcium 8.9 - 10.3 mg/dL 9.5 - 9.2  Total Protein 6.5 - 8.1 g/dL 7.6 - 7.4  Total Bilirubin 0.3 - 1.2 mg/dL 0.4 - 0.4  Alkaline Phos 38 - 126 U/L 101 - 92  AST 15 - 41 U/L 18 - 23  ALT 0 - 44 U/L 21 - 30   05/08/2019 Surgical Pathology [phone]):    05/08/2019 Surgical Pathology [phone]):    RADIOGRAPHIC STUDIES: I have personally reviewed the radiological images as listed and agreed with the findings in the report. CT Soft Tissue Neck W Contrast  Result Date: 05/03/2020 CLINICAL DATA:  Extranodal marginal zone B-cell lymphoma. Hematologic malignancy, staging; interval evaluation for progression of marginal zone lymphoma. EXAM: CT NECK WITH CONTRAST TECHNIQUE: Multidetector CT imaging of the neck was performed using the standard  protocol following the bolus administration of intravenous contrast. CONTRAST:  115mL OMNIPAQUE IOHEXOL 300 MG/ML  SOLN COMPARISON:  PET-CT 02/25/2016. Concurrently performed CT of the chest/abdomen/pelvis 05/03/2020. FINDINGS: Pharynx and larynx: Streak artifact from dental restoration limits evaluation of the oral cavity. 1.9 x 1.6 x 1.3 cm enhancing right base of tongue mass (series 3, image 47) (series 7, image 55). No appreciable laryngeal swelling or discrete mass. Salivary glands: No inflammation, mass, or stone. Thyroid: Unchanged 1.6 cm nodule within the inferior left thyroid lobe. Lymph nodes: Enlarged cystic/necrotic right level II lymph node measuring 3.1 x 2.3 cm. Adjacent abnormally enlarged and heterogeneous right level 2 lymph node measuring 1.3 x 1.1 cm (series 3, image 61). Adjacent rounded right level 2 lymph node measuring 11 mm in short axis suspicious for nodal metastatic disease (series 3, image 48) (series 7, image 62). Right level 2/3 lymph nodes more inferiorly measure subcentimeter in short axis, but are also asymmetrically prominent and suspicious (series 3, images 53 and 55). No pathologically enlarged lymph nodes identified elsewhere within the neck. Vascular: The major vascular structures of the neck are patent. Atherosclerotic plaque within the visualized aortic arch, proximal major branch vessels of the neck and carotid arteries. Limited intracranial: No acute intracranial abnormality identified. Visualized orbits: No mass or acute finding. Mastoids and visualized paranasal sinuses: Moderate bilateral ethmoid, sphenoid and maxillary sinus mucosal thickening. No significant mastoid effusion. Skeleton: Cervical spondylosis greatest at C5-C6. No acute bony abnormality or aggressive osseous lesion. Upper chest: Reported separately on  concurrently performed CT chest/abdomen/pelvis. Impressions #1 will be called to the ordering clinician or representative by the Radiologist Assistant, and  communication documented in the PACS or [employer]. IMPRESSION: 1.9 x 1.6 cm enhancing right base of tongue mass. Associated right level 2 lymphadenopathy, most notably a 3.1 x 2.3 cm cystic/necrotic node. 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 There are also right level 2/3 lymph nodes more inferiorly, which measure subcentimeter in short axis, but are asymmetrically prominent and also suspicious. ENT consultation is recommended and direct tissue sampling should be considered. No pathologically enlarged lymph nodes identified elsewhere within the neck. Unchanged 1.6 cm left thyroid lobe nodule. If not already performed, dedicated thyroid ultrasound is recommended for further evaluation. Moderate paranasal sinus mucosal thickening, as described. Aortic Atherosclerosis (ICD10-I70.0). Please see the separately reported chest CT for a description of intrathoracic findings. Electronically Signed   By: Kellie Simmering DO   On: 05/03/2020 09:10   CT CHEST ABDOMEN PELVIS W CONTRAST  Result Date: 05/03/2020 CLINICAL DATA:  59 year old male with history of marginal zone lymphoma. Evaluate for progression of disease. EXAM: CT CHEST, ABDOMEN, AND PELVIS WITH CONTRAST TECHNIQUE: Multidetector CT imaging of the chest, abdomen and pelvis was performed following the standard protocol during bolus administration of intravenous contrast. CONTRAST:  131mL OMNIPAQUE IOHEXOL 300 MG/ML  SOLN COMPARISON:  CT the chest, abdomen and pelvis 10/31/2019. FINDINGS: CT CHEST FINDINGS Cardiovascular: Heart size is normal. There is no significant pericardial fluid, thickening or pericardial calcification. There is aortic atherosclerosis, as well as atherosclerosis of the great vessels of the mediastinum and the coronary arteries, including calcified atherosclerotic plaque in the left main, left anterior descending and left circumflex coronary arteries. Mediastinum/Nodes: No  pathologically enlarged mediastinal or hilar lymph nodes. Esophagus is unremarkable in appearance. No axillary lymphadenopathy. Lungs/Pleura: Status post right middle lobectomy. Suture line in the right upper lobe and medial aspect of the right lower lobe from prior wedge resection. 1.6 cm ground-glass attenuation nodule in the left upper lobe near the apex (axial image 36 of series 5), similar to prior examination. Multiple other small 2-3 mm pulmonary nodules in the lungs bilaterally, stable in size and number compared to prior examinations, favored to be benign. No acute consolidative airspace disease. No pleural effusions. Musculoskeletal: There are no aggressive appearing lytic or blastic lesions noted in the visualized portions of the skeleton. CT ABDOMEN PELVIS FINDINGS Hepatobiliary: There are multiple low-attenuation lesions with peripheral nodular enhancement and progressive centripetal filling in the liver, diagnostic of cavernous hemangiomas, similar to the prior study, with the largest of these lesions in the right lobe of the liver between segments 5 and 6 (axial image 60 of series 3) measuring 2.4 x 2.3 cm. No other suspicious appearing hepatic lesions. No intra or extrahepatic biliary ductal dilatation. Status post cholecystectomy. Pancreas: No pancreatic mass. No pancreatic ductal dilatation. No pancreatic or peripancreatic fluid collections or inflammatory changes. Spleen: Unremarkable. Adrenals/Urinary Tract: 2.6 x 1.7 cm left adrenal nodule, stable compared to prior examinations dating back to at least 2018, favored to represent a benign lesions such as an adrenal adenoma. Right adrenal gland and bilateral kidneys are normal in appearance. No hydroureteronephrosis. Urinary bladder is normal in appearance. Stomach/Bowel: Normal appearance of the stomach. No pathologic dilatation of small bowel or colon. Normal appendix. Vascular/Lymphatic: Aortic atherosclerosis, without evidence of aneurysm or  dissection in the abdominal or pelvic vasculature. Borderline enlarged left obturator lymph node (axial image 113 of series 3),  similar to the prior examination. No lymphadenopathy noted in the abdomen or pelvis. Reproductive: Status post radical prostatectomy. No unexpected soft tissue mass in the low anatomic pelvis to suggest local recurrence of disease. Other: No significant volume of ascites.  No pneumoperitoneum. Musculoskeletal: There are no aggressive appearing lytic or blastic lesions noted in the visualized portions of the skeleton. IMPRESSION: 1. No findings to suggest recurrent lymphoma in the chest, abdomen or pelvis. 2. Stable ground-glass attenuation nodule in the left upper lobe, favored to be benign. Continued attention on follow-up studies is recommended. 3. Stable small left adrenal nodule, incompletely characterize, but favored to represent a benign lesions such as an adrenal adenoma. 4. Multiple hepatic hemangiomas redemonstrated. 5. Aortic atherosclerosis, in addition to left main and 2 vessel coronary artery disease. Please note that although the presence of coronary artery calcium documents the presence of coronary artery disease, the severity of this disease and any potential stenosis cannot be assessed on this non-gated CT examination. Assessment for potential risk factor modification, dietary therapy or pharmacologic therapy may be warranted, if clinically indicated. 6. Additional incidental findings, as above. Electronically Signed   By: Vinnie Langton M.D.   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 Masslike area of ground-glass straddles the minor fissure with septal thickening and fissural retraction. While the lesion has increased in size minimally from 09/30/2018, there is clear enlargement from baseline examination on 02/16/2016. Therefore, lesion is characterized as worrisome for adenocarcinoma, Lung-RADS 4B, suspicious. -02/25/2016 PET/CT ([id number]) revealed "Dominant 8 mm right lower lobe pulmonary nodule shows no significant FDG uptake. Other scattered less than 5 mm bilateral pulmonary nodules also show no FDG uptake but are too small to characterize by PET. Right middle lobe ground-glass opacity shows low-grade FDG uptake, and differential diagnosis includes inflammatory or infectious processes and low-grade adenocarcinoma. Multiple low-attenuation liver masses show no hypermetabolic activity compared to normal hepatic parenchyma. These remain indeterminate and cannot be characterized without IV contrast. Abdomen MRI without and with contrast recommended for further evaluation. 2.5 cm left adrenal mass shows mild FDG uptake, and remains indeterminate. Differential diagnosis includes atypical adenoma, metastasis, pheochromocytoma, or less likely adrenal cortical carcinoma."  PLAN: -Discussed pt labwork today, 05/05/20; WBC mildly elevated - likely reactive, RBC & PLT are nml, blood chemistries are nml, LDH is WNL. -Discussed 05/03/2020 CT Soft Tissue Neck ([id number]) which revealed "1.9 x 1.6 cm enhancing right base of tongue mass. Associated right level 2 lymphadenopathy, most notably a 3.1 x 2.3 cm cystic/necrotic node. 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 There are also right level 2/3 lymph nodes more inferiorly, which measure subcentimeter in short axis, but are asymmetrically  prominent and also suspicious. No pathologically enlarged lymph nodes identified elsewhere within the neck. Unchanged 1.6 cm left thyroid lobe nodule. Moderate paranasal sinus mucosal thickening, as described." -Discussed 05/03/2020 CT C/A/P ([id number]) which revealed "1. No findings to suggest recurrent lymphoma in the chest, abdomen or pelvis. 2. Stable ground-glass attenuation nodule in the  left upper lobe, favored to be benign. Continued attention on follow-up studies is recommended. 3. Stable small left adrenal nodule, incompletely characterize, but favored to represent a benign lesions such as an adrenal adenoma. 4. Multiple hepatic hemangiomas redemonstrated. 5. Aortic atherosclerosis, in addition to left main and 2 vessel coronary artery disease." -Advised pt that due the asymmetry of his lymphadenopathy and location of mass would need to r/o a BOT squamous cell carcinoma.  -Advised pt that a thorough evaluation of the area and biopsy should be performed by an ENT - pt agrees -Will refer pt to Dr. Melony Overly, ENT urgently  -Will see back in 5-6 weeks    FOLLOW UP: -Urgent referral to ENT Dr Melony Overly within 1 week for new BOT mass and rt cervical LNadenopathy concerning for tongue squamous cell cancer -RTC with Dr Irene Limbo in 5-6 weeks   The total time spent in the appt was 35 minutes and more than 50% was on counseling and direct patient cares.  All of the patient's questions were answered with apparent satisfaction. The patient knows to call the clinic with any problems, questions or concerns.   Sullivan Lone MD [place] [facility] Hematology/Oncology Physician [facility]  (Office):       [phone] (Work cell):  [phone] (Fax):           [phone]  05/05/2020 3:52 PM  I, Yevette Edwards, am acting as a scribe for Dr. Sullivan Lone.   .I have reviewed the above documentation for accuracy and completeness, and I agree with the above. Brunetta Genera MD

## 2020-05-13 DIAGNOSIS — Z794 Long term (current) use of insulin: Secondary | ICD-10-CM | POA: Diagnosis not present

## 2020-05-13 DIAGNOSIS — E103293 Type 1 diabetes mellitus with mild nonproliferative diabetic retinopathy without macular edema, bilateral: Secondary | ICD-10-CM | POA: Diagnosis not present

## 2020-05-19 ENCOUNTER — Other Ambulatory Visit: Payer: Self-pay

## 2020-05-19 ENCOUNTER — Ambulatory Visit (INDEPENDENT_AMBULATORY_CARE_PROVIDER_SITE_OTHER): Payer: BC Managed Care – PPO | Admitting: Otolaryngology

## 2020-05-19 ENCOUNTER — Encounter (INDEPENDENT_AMBULATORY_CARE_PROVIDER_SITE_OTHER): Payer: Self-pay | Admitting: Otolaryngology

## 2020-05-19 VITALS — Temp 96.6°F

## 2020-05-19 DIAGNOSIS — D3702 Neoplasm of uncertain behavior of tongue: Secondary | ICD-10-CM

## 2020-05-19 DIAGNOSIS — R590 Localized enlarged lymph nodes: Secondary | ICD-10-CM | POA: Diagnosis not present

## 2020-05-19 NOTE — Progress Notes (Signed)
HPI: Jose Harvey is a 59 y.o. male who presents is referred by Dr. Irene Limbo For evaluation of right base of tongue mass and right neck node.  Patient apparently has a diagnosis of non-Hodgkin's lymphoma following a right lung lobectomy for a mass that turned out to be a lymphoma.  He has had a recent new right neck node develop and underwent a CT scan that shows a large right neck node and a right base of tongue mass.  Patient denies any sore throat or trouble eating or swallowing.  He has noticed the right neck node for the past couple of months..  Past Medical History:  Diagnosis Date   Adrenal adenoma, left 03/2017   noted on CT   Allergic rhinitis, cause unspecified    Anxiety state, unspecified    Aortic atherosclerosis ([place]) 03/2017   noted on CT   BPV (benign positional vertigo)    COPD (chronic obstructive pulmonary disease) with emphysema (HCC)    mild followed by [facility]    Coronary artery disease    Depressive disorder, not elsewhere classified    Dyspnea    history of   ED (erectile dysfunction)    Gallbladder polyp 03/2016   noted on CT   Hepatic hemangioma 03/2017   noted on CT   History of kidney stones    hx of years ago    Indigestion    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)    Mixed hyperlipidemia    Prostate cancer ([place])    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, not stated as uncontrolled(250.51)    Type I (juvenile type) diabetes mellitus without mention of complication, not stated as uncontrolled    type I - followed by Dr Buddy Duty    Vesicoureteral reflux    Past Surgical History:  Procedure Laterality Date   CHOLECYSTECTOMY N/A 05/17/2016   Procedure: LAPAROSCOPIC CHOLECYSTECTOMY;  Surgeon: Clovis Riley, MD;  Location: [facility];  Service: General;  Laterality: N/A;   cyst removed from throat      dental surgeries      LYMPHADENECTOMY Bilateral 07/02/2017   Procedure: BILATERAL  LYMPHADENECTOMY;  Surgeon: Lucas Mallow, MD;  Location: [facility];  Service: Urology;  Laterality: Bilateral;   PROSTATE BIOPSY     ROBOT ASSISTED LAPAROSCOPIC RADICAL PROSTATECTOMY N/A 07/02/2017   Procedure: XI ROBOTIC ASSISTED LAPAROSCOPIC RADICAL PROSTATECTOMY;  Surgeon: Lucas Mallow, MD;  Location: [facility];  Service: Urology;  Laterality: N/A;   TONSILLECTOMY     VIDEO ASSISTED THORACOSCOPY (VATS)/ LOBECTOMY Right 05/08/2019   Procedure: Right VIDEO ASSISTED THORACOSCOPY with   MIDDLE LOBECTOMY and Enbloc portion of Upper Lobe with Node dissection, Intercostal Nerve Block;  Surgeon: Melrose Nakayama, MD;  Location: [facility] OR;  Service: Thoracic;  Laterality: Right;   WISDOM TOOTH EXTRACTION     Social History   Socioeconomic History   Marital status: Divorced    Spouse name: Not on file   Number of children: 2   Years of education: Not on file   Highest education level: Not on file  Occupational History    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 Patient has two grown daughters but neither lives locally.   Social Determinants of Health   Financial Resource Strain: Not on file  Food Insecurity: Not on file  Transportation Needs: Not on file  Physical Activity: Not on file  Stress: Not on file  Social Connections: Not on file   Family History  Problem Relation Age of Onset   Diabetes type I  Father    Heart attack Father    Hypertension Father    Emphysema Father    CAD Father    Colon cancer Maternal Grandfather    Emphysema Paternal Grandfather    Diabetes type I Paternal Grandfather    Kidney disease Neg Hx    Liver disease Neg Hx    Breast cancer Neg Hx    Prostate cancer Neg Hx    Allergies  Allergen Reactions   Crestor [Rosuvastatin]     Muscle aches   Lipitor [Atorvastatin]     Muscle aches   Other Other (See Comments)    Any antihistamines Causes shaking   Zocor [Simvastatin] Other (See Comments)    Muscle aches    Prior to Admission medications   Medication Sig Start Date End Date Taking? Authorizing Provider  aspirin 81 MG tablet Take 1 tablet (81 mg total) by mouth daily. 07/07/17   Filippou, Braxton Feathers, MD  cetirizine (KLS ALLER-TEC) 10 MG tablet Take 5 mg by mouth daily.    [provider]  Coenzyme Q10 (COQ10) 100 MG CAPS Take 100 mg by mouth daily.    [provider]  Continuous Blood Gluc Sensor (FREESTYLE LIBRE 14 DAY SENSOR) MISC AS DIRECTED CHANGE SENSOR EVERY 14 DAYS 28 09/26/19   [provider]  Insulin Human (INSULIN PUMP) SOLN Inject into the skin continuous. NOVOLOG 100 UNIT/ML VIAL Basal rate per patient is as follows: at 12 midnite- .6 , at 0500am-.8 then at 0800am-.6    [provider]  lisinopril (PRINIVIL,ZESTRIL) 5 MG tablet Take 5 mg by mouth daily.  09/09/13   [provider]  Multiple Vitamins-Minerals (ICAPS AREDS 2) CAPS Take 1 capsule by mouth daily.    [provider]  NOVOLOG 100 UNIT/ML injection USE AS DIRECTED VIA INSULIN PUMP. MAX 66 UNITS DAILY 90 08/09/19   [provider]  pantoprazole (PROTONIX) 40 MG tablet Take 40 mg by mouth daily.    [provider]  rosuvastatin (CRESTOR) 5 MG tablet Take 1 tablet (5 mg total) by mouth daily. 09/12/16   Dorothy Spark, MD  sertraline (ZOLOFT) 100 MG tablet Take 100 mg by mouth daily.  01/25/16    [provider]     Positive ROS: Otherwise negative  All other systems have been reviewed and were otherwise negative with the exception of those mentioned in the HPI and as above.  Physical Exam: Constitutional: Alert, well-appearing, no acute distress Ears: External ears without lesions or tenderness. Ear canals are clear bilaterally with intact, clear TMs.  Nasal: External nose without lesions. Septum with minimal deformity and mild rhinitis.. Clear nasal passages otherwise. Oral: Lips and gums without  lesions. Tongue and palate mucosa without lesions. Posterior oropharynx clear.  Patient is status post tonsillectomy as a child.  Tonsil fossa's appear normal bilaterally.  Indirect laryngoscopy revealed minimal fullness in the right base of tongue region.  No gross ulceration noted.  Palpation revealed a right base of tongue mass. Fiberoptic laryngoscopy was performed through the left nostril.  The nasopharynx was clear.  The base of tongue appeared clear with no obvious mass vallecula area was clear.  Epiglottis was normal.  Vocal cords were clear bilaterally with normal vocal mobility.  Piriform sinuses were clear. Neck: Patient has a easily palpable 4 cm right upper cervical node which is firm to palpation. Respiratory: Breathing comfortably  Skin: No facial/neck lesions or rash noted.  Laryngoscopy  Date/Time: 05/19/2020 1:07 PM Performed by: Rozetta Nunnery, MD Authorized by: Rozetta Nunnery, MD   Consent:    Consent obtained:  Verbal   Consent given by:  Patient Procedure details:    Indications: direct visualization of the upper aerodigestive tract     Medication:  Afrin   Instrument: flexible fiberoptic laryngoscope     Scope location: left nare   Sinus:    Left nasopharynx: normal   Mouth:    Oropharynx: normal     Vallecula: normal     Base of tongue: normal     Epiglottis: normal   Throat:    True vocal cords: normal   Comments:     On  fiberoptic laryngoscopy there is no obvious ulceration noted although he did have some fullness in the right base of tongue region.  Vocal cords were normal bilaterally with normal vocal mobility.    Assessment: Right base of tongue mass with right neck lymphadenopathy.  This may represent a squamous cell carcinoma of the base of tongue versus lymphoma.  Plan: Discussed with patient today concerning obtaining needle biopsy of the right neck node and we will plan on scheduling patient for ultrasound-guided core needle biopsy of the right neck node.  Further procedures will depend on results of needle biopsy of right neck node. Patient will call us concerning results of the needle biopsy 2 to 3 days following the procedure.   Radene Journey, MD   CC:

## 2020-05-20 ENCOUNTER — Other Ambulatory Visit (INDEPENDENT_AMBULATORY_CARE_PROVIDER_SITE_OTHER): Payer: Self-pay

## 2020-05-20 DIAGNOSIS — R590 Localized enlarged lymph nodes: Secondary | ICD-10-CM

## 2020-05-20 NOTE — Progress Notes (Signed)
S 

## 2020-05-21 ENCOUNTER — Encounter (HOSPITAL_COMMUNITY): Payer: Self-pay | Admitting: Radiology

## 2020-05-21 NOTE — Progress Notes (Signed)
Jose Harvey. [name] Male, 59 y.o., 1961-03-13  MRN:  [id number] Phone:  [phone] [name])       PCP:  Leeroy Cha, MD Coverage:  Sherre Poot [employer] Ppo  Next Appt With Oncology 06/17/2020 at 9:00 AM           RE: [place] FNA SOFT TISSUE Received: [name], [name], DO  Garth Bigness D  OK for US guided cervical node biopsy. Suspect H&N cancer.   Earleen Newport        Previous Messages   ----- Message -----  From: Garth Bigness D  Sent: 05/20/2020  2:50 PM EST  To: [facility] Procedure Requests  Subject: [place] FNA SOFT TISSUE                Procedure:  [place] FNA SOFT TISSUE   Reason: Cervical lymphadenopathy, Right neck nodule   History: CT in computer   Provider:  Rozetta Nunnery   Provider Contact:  [phone]

## 2020-05-25 ENCOUNTER — Other Ambulatory Visit: Payer: Self-pay | Admitting: Physician Assistant

## 2020-05-26 ENCOUNTER — Encounter (HOSPITAL_COMMUNITY): Payer: Self-pay

## 2020-05-26 ENCOUNTER — Other Ambulatory Visit (INDEPENDENT_AMBULATORY_CARE_PROVIDER_SITE_OTHER): Payer: Self-pay | Admitting: Otolaryngology

## 2020-05-26 ENCOUNTER — Ambulatory Visit (HOSPITAL_COMMUNITY)
Admission: RE | Admit: 2020-05-26 | Discharge: 2020-05-26 | Disposition: A | Discharge status: Home / Self Care | Payer: BC Managed Care – PPO | Source: Ambulatory Visit | Attending: Otolaryngology | Admitting: Otolaryngology

## 2020-05-26 ENCOUNTER — Other Ambulatory Visit: Payer: Self-pay

## 2020-05-26 DIAGNOSIS — Z859 Personal history of malignant neoplasm, unspecified: Secondary | ICD-10-CM | POA: Diagnosis not present

## 2020-05-26 DIAGNOSIS — Z8546 Personal history of malignant neoplasm of prostate: Secondary | ICD-10-CM | POA: Diagnosis not present

## 2020-05-26 DIAGNOSIS — R590 Localized enlarged lymph nodes: Secondary | ICD-10-CM

## 2020-05-26 DIAGNOSIS — C770 Secondary and unspecified malignant neoplasm of lymph nodes of head, face and neck: Secondary | ICD-10-CM | POA: Diagnosis not present

## 2020-05-26 DIAGNOSIS — Z8572 Personal history of non-Hodgkin lymphomas: Secondary | ICD-10-CM | POA: Insufficient documentation

## 2020-05-26 DIAGNOSIS — Z87891 Personal history of nicotine dependence: Secondary | ICD-10-CM | POA: Insufficient documentation

## 2020-05-26 LAB — CBC WITH DIFFERENTIAL/PLATELET
Abs Immature Granulocytes: 0.02 10*3/uL (ref 0.00–0.07)
Basophils Absolute: 0.1 10*3/uL (ref 0.0–0.1)
Basophils Relative: 1 %
Eosinophils Absolute: 0.6 10*3/uL — ABNORMAL HIGH (ref 0.0–0.5)
Eosinophils Relative: 8 %
HCT: 44.2 % (ref 39.0–52.0)
Hemoglobin: 14.2 g/dL (ref 13.0–17.0)
Immature Granulocytes: 0 %
Lymphocytes Relative: 34 %
Lymphs Abs: 2.5 10*3/uL (ref 0.7–4.0)
MCH: 29.8 pg (ref 26.0–34.0)
MCHC: 32.1 g/dL (ref 30.0–36.0)
MCV: 92.9 fL (ref 80.0–100.0)
Monocytes Absolute: 0.6 10*3/uL (ref 0.1–1.0)
Monocytes Relative: 8 %
Neutro Abs: 3.6 10*3/uL (ref 1.7–7.7)
Neutrophils Relative %: 49 %
Platelets: 259 10*3/uL (ref 150–400)
RBC: 4.76 MIL/uL (ref 4.22–5.81)
RDW: 12.5 % (ref 11.5–15.5)
WBC: 7.4 10*3/uL (ref 4.0–10.5)
nRBC: 0 % (ref 0.0–0.2)

## 2020-05-26 LAB — GLUCOSE, CAPILLARY: Glucose-Capillary: 160 mg/dL — ABNORMAL HIGH (ref 70–99)

## 2020-05-26 LAB — PROTIME-INR
INR: 1 (ref 0.8–1.2)
Prothrombin Time: 12.5 s (ref 11.4–15.2)

## 2020-05-26 MED ORDER — LIDOCAINE-EPINEPHRINE 1 %-1:100000 IJ SOLN
INTRAMUSCULAR | Status: AC
  Filled 2020-05-26: qty 1

## 2020-05-26 NOTE — Procedures (Signed)
Pre Procedure Dx: Cervical lymphadenopathy Post Procedural Dx: Same  Technically successful US guided biopsy of dominant right cervical lymph node.   EBL: None  No immediate complications.   Jay France Noyce, MD Pager #: [phone]    

## 2020-05-26 NOTE — Progress Notes (Signed)
Pt discharged home with sister ambulatory. Awake and alert. In no distress. Band aid to right neck dry and intact.

## 2020-05-26 NOTE — H&P (Signed)
Chief Complaint: Patient was seen in consultation today for right cervical lymph node biopsy at the request of Newman,Christopher E  Referring Physician(s): Newman,Christopher E  Supervising Physician: Sandi Mariscal  Patient Status: [facility] - Out-pt  History of Present Illness: Jose Harvey is a 59 y.o. male   Hx Prostate ca 2019--- surgical removal Hx Non Hodgkins lymphoma 2020--- Rt lung lobectomy  Noted right cervical LN swelling in Rt neck in October Dr Irene Limbo ordered imaging 05/03/20:  IMPRESSION: 1.9 x 1.6 cm enhancing right base of tongue mass. Associated right level 2 lymphadenopathy, most notably a 3.1 x 2.3 cm cystic/necrotic node. 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 There are also right level 2/3 lymph nodes more inferiorly, which measure subcentimeter in short axis, but are asymmetrically prominent and also suspicious. ENT consultation is recommended and direct tissue sampling should be considered. No pathologically enlarged lymph nodes identified elsewhere within the neck. Unchanged 1.6 cm left thyroid lobe nodule. If not already performed, dedicated thyroid ultrasound is recommended for further evaluation  IMPRESSION: 1. No findings to suggest recurrent lymphoma in the chest, abdomen or pelvis. 2. Stable ground-glass attenuation nodule in the left upper lobe, favored to be benign. Continued attention on follow-up studies is recommended. 3. Stable small left adrenal nodule, incompletely characterize, but favored to represent a benign lesions such as an adrenal adenoma. 4. Multiple hepatic hemangiomas redemonstrated  Scheduled now for cervical LN bx  Past Medical History:  Diagnosis Date  . Adrenal adenoma, left 03/2017   noted on CT  . Allergic rhinitis, cause unspecified   . Anxiety state, unspecified   . Aortic atherosclerosis ([place]) 03/2017   noted on CT  . BPV (benign positional vertigo)    . COPD (chronic obstructive pulmonary disease) with emphysema (HCC)    mild followed by [facility]   . Coronary artery disease   . Depressive disorder, not elsewhere classified   . Dyspnea    history of  . ED (erectile dysfunction)   . Gallbladder polyp 03/2016   noted on CT  . Hepatic hemangioma 03/2017   noted on CT  . History of kidney stones    hx of years ago   . Indigestion   . 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 Mixed hyperlipidemia   . Prostate cancer ([place])   . 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 Type I (juvenile type) diabetes mellitus without mention of complication, not stated as uncontrolled    type I - followed by Dr Buddy Duty   . Vesicoureteral reflux     Past Surgical History:  Procedure Laterality Date  . CHOLECYSTECTOMY N/A 05/17/2016   Procedure: LAPAROSCOPIC CHOLECYSTECTOMY;  Surgeon: Clovis Riley, MD;  Location: [facility];  Service: General;  Laterality: N/A;  . cyst removed from throat     . dental surgeries     . LYMPHADENECTOMY Bilateral 07/02/2017   Procedure: BILATERAL LYMPHADENECTOMY;  Surgeon: Lucas Mallow, MD;  Location: [facility];  Service: Urology;  Laterality: Bilateral;  . PROSTATE BIOPSY    . ROBOT ASSISTED LAPAROSCOPIC RADICAL PROSTATECTOMY N/A 07/02/2017   Procedure: XI ROBOTIC ASSISTED LAPAROSCOPIC RADICAL PROSTATECTOMY;  Surgeon: Lucas Mallow, MD;  Location: [facility];  Service: Urology;  Laterality: N/A;  . TONSILLECTOMY    . VIDEO ASSISTED THORACOSCOPY (VATS)/ LOBECTOMY Right 05/08/2019   Procedure: Right VIDEO ASSISTED THORACOSCOPY with   MIDDLE LOBECTOMY and  Enbloc portion of Upper Lobe with Node dissection, Intercostal Nerve Block;  Surgeon: Melrose Nakayama, MD;  Location: [place];  Service: Thoracic;  Laterality: Right;  . WISDOM TOOTH EXTRACTION      Allergies: Crestor [rosuvastatin], Lipitor [atorvastatin], Other, and Zocor  [simvastatin]  Medications: Prior to Admission medications   Medication Sig Start Date End Date Taking? Authorizing Provider  aspirin 81 MG tablet Take 1 tablet (81 mg total) by mouth daily. 07/07/17  Yes Filippou, Braxton Feathers, MD  cetirizine (ZYRTEC) 10 MG tablet Take 10 mg by mouth daily.   Yes [provider]  chlorhexidine (PERIDEX) 0.12 % solution Use as directed 15 mLs in the mouth or throat daily.   Yes [provider]  Coenzyme Q10 (COQ10) 100 MG CAPS Take 100 mg by mouth daily.   Yes [provider]  Continuous Blood Gluc Sensor (FREESTYLE LIBRE 14 DAY SENSOR) MISC AS DIRECTED CHANGE SENSOR EVERY 14 DAYS 28 09/26/19  Yes [provider]  Insulin Human (INSULIN PUMP) SOLN Inject into the skin continuous. NOVOLOG 100 UNIT/ML VIAL   Yes [provider]  lisinopril (PRINIVIL,ZESTRIL) 5 MG tablet Take 5 mg by mouth daily.  09/09/13  Yes [provider]  Multiple Vitamins-Minerals (ICAPS AREDS 2) CAPS Take 1 capsule by mouth daily.   Yes [provider]  NOVOLOG 100 UNIT/ML injection Averages about 30 units per day via insulin pump 08/09/19  Yes [provider]  pantoprazole (PROTONIX) 40 MG tablet Take 40 mg by mouth daily.   Yes [provider]  rosuvastatin (CRESTOR) 5 MG tablet Take 1 tablet (5 mg total) by mouth daily. 09/12/16  Yes Dorothy Spark, MD  sertraline (ZOLOFT) 100 MG tablet Take 50 mg by mouth daily. 01/25/16  Yes [provider]  ibuprofen (ADVIL) 200 MG tablet Take 400-800 mg by mouth every 6 (six) hours as needed for moderate pain.    [provider]     Family History  Problem Relation Age of Onset  . Diabetes type I Father   . Heart attack Father   . Hypertension Father   . Emphysema Father   . CAD Father   . Colon cancer Maternal Grandfather   . Emphysema Paternal Grandfather   . Diabetes type I Paternal Grandfather   . Kidney disease Neg Hx   . Liver disease Neg Hx    . Breast cancer Neg Hx   . Prostate cancer Neg Hx     Social History   Socioeconomic History  . Marital status: Divorced    Spouse name: Not on file  . Number of children: 2  . Years of education: Not on file  . 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 Smoking status: Former Smoker    Packs/day: 1.50    Years: 27.00    Pack years: 40.50    Types: Cigarettes    Quit date: 06/05/2008    Years since quitting: 11.9  . Smokeless tobacco: Former Systems developer    Types: Snuff    Quit date: 2015  Vaping Use  . Vaping Use: Never used  Substance and Sexual Activity  . Alcohol use: Yes    Alcohol/week: 6.0 standard drinks    Types: 6 Cans of beer per week    Comment: occasional 6pk per week  . Drug use: No  . Sexual activity: Not Currently  Other Topics Concern  . Not on file  Social History Narrative   Patient divorced. Patient has two grown daughters but neither lives locally.   Social Determinants of Health   Financial Resource Strain: Not on file  Food Insecurity: Not on file  Transportation Needs: Not on file  Physical Activity: Not on file  Stress: Not on file  Social Connections: Not on file    Review of Systems: A 12 point ROS discussed and pertinent positives are indicated in the HPI above.  All other systems are negative.  Review of Systems  Constitutional: Negative for activity change, fatigue, fever and unexpected weight change.  HENT: Negative for dental problem, sore throat and trouble swallowing.   Respiratory: Negative for cough and shortness of breath.   Cardiovascular: Negative for chest pain.  Gastrointestinal: Negative for abdominal pain.  Musculoskeletal: Negative for back pain and gait problem.  Psychiatric/Behavioral: Negative for behavioral problems and confusion.    Vital Signs: BP (!) 142/84   Pulse 66   Temp 97.7 F (36.5 C) (Oral)   Resp 16   Ht 5\' 11"  (1.803 m)   Wt 180 lb  (81.6 kg)   SpO2 98%   BMI 25.10 kg/m   Physical Exam Vitals reviewed.  HENT:     Mouth/Throat:     Mouth: Mucous membranes are moist.  Cardiovascular:     Rate and Rhythm: Normal rate and regular rhythm.     Heart sounds: Normal heart sounds.  Pulmonary:     Effort: Pulmonary effort is normal.     Breath sounds: Normal breath sounds.  Abdominal:     Palpations: Abdomen is soft.  Musculoskeletal:        General: Normal range of motion.  Skin:    General: Skin is warm.  Neurological:     Mental Status: He is alert and oriented to person, place, and time.  Psychiatric:        Behavior: Behavior normal.     Imaging: CT Soft Tissue Neck W Contrast  Result Date: 05/03/2020 CLINICAL DATA:  Extranodal marginal zone B-cell lymphoma. Hematologic malignancy, staging; interval evaluation for progression of marginal zone lymphoma. EXAM: CT NECK WITH CONTRAST TECHNIQUE: Multidetector CT imaging of the neck was performed using the standard protocol following the bolus administration of intravenous contrast. CONTRAST:  11mL OMNIPAQUE IOHEXOL 300 MG/ML  SOLN COMPARISON:  PET-CT 02/25/2016. Concurrently performed CT of the chest/abdomen/pelvis 05/03/2020. FINDINGS: Pharynx and larynx: Streak artifact from dental restoration limits evaluation of the oral cavity. 1.9 x 1.6 x 1.3 cm enhancing right base of tongue mass (series 3, image 47) (series 7, image 55). No appreciable laryngeal swelling or discrete mass. Salivary glands: No inflammation, mass, or stone. Thyroid: Unchanged 1.6 cm nodule within the inferior left thyroid lobe. Lymph nodes: Enlarged cystic/necrotic right level II lymph node measuring 3.1 x 2.3 cm. Adjacent abnormally enlarged and heterogeneous right level 2 lymph node measuring 1.3 x 1.1 cm (series 3, image 61). Adjacent rounded right level 2 lymph node measuring 11 mm in short axis suspicious for nodal metastatic disease (series 3, image 48) (series 7, image 62). Right level 2/3  lymph nodes more inferiorly measure subcentimeter in short axis, but are also asymmetrically prominent and suspicious (series 3, images 53 and 55). No pathologically enlarged lymph nodes identified elsewhere within the neck. Vascular: The major vascular structures of the neck are patent. Atherosclerotic plaque within the visualized aortic arch, proximal major branch vessels of the neck and carotid arteries. Limited intracranial: No acute intracranial abnormality identified. Visualized orbits: No mass  or acute finding. Mastoids and visualized paranasal sinuses: Moderate bilateral ethmoid, sphenoid and maxillary sinus mucosal thickening. No significant mastoid effusion. Skeleton: Cervical spondylosis greatest at C5-C6. No acute bony abnormality or aggressive osseous lesion. Upper chest: Reported separately on concurrently performed CT chest/abdomen/pelvis. Impressions #1 will be called to the ordering clinician or representative by the Radiologist Assistant, and communication documented in the PACS or [employer]. IMPRESSION: 1.9 x 1.6 cm enhancing right base of tongue mass. Associated right level 2 lymphadenopathy, most notably a 3.1 x 2.3 cm cystic/necrotic node. 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 There are also right level 2/3 lymph nodes more inferiorly, which measure subcentimeter in short axis, but are asymmetrically prominent and also suspicious. ENT consultation is recommended and direct tissue sampling should be considered. No pathologically enlarged lymph nodes identified elsewhere within the neck. Unchanged 1.6 cm left thyroid lobe nodule. If not already performed, dedicated thyroid ultrasound is recommended for further evaluation. Moderate paranasal sinus mucosal thickening, as described. Aortic Atherosclerosis (ICD10-I70.0). Please see the separately reported chest CT for a description of intrathoracic findings. Electronically Signed   By:  Kellie Simmering DO   On: 05/03/2020 09:10   CT CHEST ABDOMEN PELVIS W CONTRAST  Result Date: 05/03/2020 CLINICAL DATA:  59 year old male with history of marginal zone lymphoma. Evaluate for progression of disease. EXAM: CT CHEST, ABDOMEN, AND PELVIS WITH CONTRAST TECHNIQUE: Multidetector CT imaging of the chest, abdomen and pelvis was performed following the standard protocol during bolus administration of intravenous contrast. CONTRAST:  132mL OMNIPAQUE IOHEXOL 300 MG/ML  SOLN COMPARISON:  CT the chest, abdomen and pelvis 10/31/2019. FINDINGS: CT CHEST FINDINGS Cardiovascular: Heart size is normal. There is no significant pericardial fluid, thickening or pericardial calcification. There is aortic atherosclerosis, as well as atherosclerosis of the great vessels of the mediastinum and the coronary arteries, including calcified atherosclerotic plaque in the left main, left anterior descending and left circumflex coronary arteries. Mediastinum/Nodes: No pathologically enlarged mediastinal or hilar lymph nodes. Esophagus is unremarkable in appearance. No axillary lymphadenopathy. Lungs/Pleura: Status post right middle lobectomy. Suture line in the right upper lobe and medial aspect of the right lower lobe from prior wedge resection. 1.6 cm ground-glass attenuation nodule in the left upper lobe near the apex (axial image 36 of series 5), similar to prior examination. Multiple other small 2-3 mm pulmonary nodules in the lungs bilaterally, stable in size and number compared to prior examinations, favored to be benign. No acute consolidative airspace disease. No pleural effusions. Musculoskeletal: There are no aggressive appearing lytic or blastic lesions noted in the visualized portions of the skeleton. CT ABDOMEN PELVIS FINDINGS Hepatobiliary: There are multiple low-attenuation lesions with peripheral nodular enhancement and progressive centripetal filling in the liver, diagnostic of cavernous hemangiomas, similar to  the prior study, with the largest of these lesions in the right lobe of the liver between segments 5 and 6 (axial image 60 of series 3) measuring 2.4 x 2.3 cm. No other suspicious appearing hepatic lesions. No intra or extrahepatic biliary ductal dilatation. Status post cholecystectomy. Pancreas: No pancreatic mass. No pancreatic ductal dilatation. No pancreatic or peripancreatic fluid collections or inflammatory changes. Spleen: Unremarkable. Adrenals/Urinary Tract: 2.6 x 1.7 cm left adrenal nodule, stable compared to prior examinations dating back to at least 2018, favored to represent a benign lesions such as an adrenal adenoma. Right adrenal gland and bilateral kidneys are normal in appearance. No hydroureteronephrosis. Urinary bladder is normal in appearance. Stomach/Bowel: Normal appearance of  the stomach. No pathologic dilatation of small bowel or colon. Normal appendix. Vascular/Lymphatic: Aortic atherosclerosis, without evidence of aneurysm or dissection in the abdominal or pelvic vasculature. Borderline enlarged left obturator lymph node (axial image 113 of series 3), similar to the prior examination. No lymphadenopathy noted in the abdomen or pelvis. Reproductive: Status post radical prostatectomy. No unexpected soft tissue mass in the low anatomic pelvis to suggest local recurrence of disease. Other: No significant volume of ascites.  No pneumoperitoneum. Musculoskeletal: There are no aggressive appearing lytic or blastic lesions noted in the visualized portions of the skeleton. IMPRESSION: 1. No findings to suggest recurrent lymphoma in the chest, abdomen or pelvis. 2. Stable ground-glass attenuation nodule in the left upper lobe, favored to be benign. Continued attention on follow-up studies is recommended. 3. Stable small left adrenal nodule, incompletely characterize, but favored to represent a benign lesions such as an adrenal adenoma. 4. Multiple hepatic hemangiomas redemonstrated. 5. Aortic  atherosclerosis, in addition to left main and 2 vessel coronary artery disease. Please note that although the presence of coronary artery calcium documents the presence of coronary artery disease, the severity of this disease and any potential stenosis cannot be assessed on this non-gated CT examination. Assessment for potential risk factor modification, dietary therapy or pharmacologic therapy may be warranted, if clinically indicated. 6. Additional incidental findings, as above. Electronically Signed   By: Vinnie Langton M.D.   On: 05/03/2020 09:35    Labs:  CBC: Recent Labs    10/31/19 0736 02/19/20 1422 05/05/20 1317 05/26/20 0605  WBC 6.5 10.2 11.8* 7.4  HGB 14.4 15.0 14.6 14.2  HCT 43.6 45.2 44.3 44.2  PLT 215 252 243 259    COAGS: No results for input(s): INR, APTT in the last 8760 hours.  BMP: Recent Labs    10/31/19 0736 02/19/20 1422 05/03/20 0801 05/05/20 1317  NA 138 137  --  136  K 5.0 4.9  --  3.9  CL 103 103  --  104  CO2 24 28  --  25  GLUCOSE 182* 261*  --  156*  BUN 26* 18  --  18  CALCIUM 9.0 9.2  --  9.5  CREATININE 1.12 1.15 1.00 1.07  GFRNONAA >60 >60  --  >60  GFRAA >60 >60  --   --     LIVER FUNCTION TESTS: Recent Labs    10/31/19 0736 02/19/20 1422 05/05/20 1317  BILITOT 0.5 0.4 0.4  AST 20 23 18   ALT 25 30 21   ALKPHOS 92 92 101  PROT 7.0 7.4 7.6  ALBUMIN 3.8 3.8 3.9    TUMOR MARKERS: No results for input(s): AFPTM, CEA, CA199, CHROMGRNA in the last 8760 hours.  Assessment and Plan:  Hx Prostate ca 2019 and NHL 2020 Now new cervical Lymphadenopathy Scheduled for right cervical LN biopsy Risks and benefits of  right cervical LN biopsy was discussed with the patient and/or patient's family including, but not limited to bleeding, infection, damage to adjacent structures or low yield requiring additional tests.  All of the questions were answered and there is agreement to proceed. Consent signed and in chart.   Thank you for  this interesting consult.  I greatly enjoyed meeting Jose Harvey and look forward to participating in their care.  A copy of this report was sent to the requesting provider on this date.  Electronically Signed: Lavonia Drafts, PA-C 05/26/2020, 7:16 AM   I spent a total of  30 Minutes  in face to face in clinical consultation, greater than 50% of which was counseling/coordinating care for right cervical LN bx

## 2020-05-27 ENCOUNTER — Telehealth: Payer: Self-pay | Admitting: Hematology

## 2020-05-27 DIAGNOSIS — C760 Malignant neoplasm of head, face and neck: Secondary | ICD-10-CM

## 2020-05-27 NOTE — Telephone Encounter (Signed)
SURGICAL PATHOLOGY  CASE: [id number]  PATIENT: Jose Harvey  Surgical Pathology Report      Clinical History: history of lymphoma (cm)      FINAL MICROSCOPIC DIAGNOSIS:   A. LYMPH NODE, RIGHT CERVICAL, NEEDLE CORE BIOPSY:  - Squamous cell carcinoma.   COMMENT:   P16 will be ordered. Jose. Melina Copa has reviewed the case. Jose. Irene Limbo was  notified on 05/27/2020.     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 -ordered PET/CT and referral to radiation oncology with patients consent -will forward this to H&N nurse navigator for co-ordination and placing on tumor board. -patient is already under the care of Jose Harvey and shall be following up with him for surgical consideration  .[place]

## 2020-05-31 LAB — SURGICAL PATHOLOGY

## 2020-06-03 NOTE — Progress Notes (Signed)
Oncology Nurse Navigator Documentation  Placed introductory call to new referral patient Jose Harvey.  Introduced myself as the H&N oncology nurse navigator that works with Dr. Candise Che and Dr. Basilio Cairo to whom he has been referred by Dr. Candise Che. He confirmed understanding of referral.  Briefly explained my role as his navigator, provided my contact information.   Confirmed understanding of upcoming appts and [facility] location, explained arrival and registration process.  I explained the purpose of a dental evaluation prior to starting RT, indicated he would be contacted by [facility] to arrange an appt.    I encouraged him to call with questions/concerns as he moves forward with appts and procedures.    He verbalized understanding of information provided, expressed appreciation for my call.   Navigator Initial Assessment . 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  . Currently on FMLA / STD: no . Living Situation: He lives alone . Support System: Family in town.  Marland Kitchen PCP: Dr. Chales Salmon . PCD: Dr. Kathy Breach . Financial Concerns: no . Transportation Needs: no . Sensory Deficits: no . Language Barriers/Interpreter Needed:  no . Ambulation Needs: no . DME Used in Home: no . Psychosocial Needs:  no . Concerns/Needs Understanding Cancer:  addressed/answered by navigator to best of ability . Self-Expressed Needs: no   Hedda Slade RN, BSN, OCN Head & Neck Oncology Nurse Navigator [facility] Phone # [phone]  Fax # [phone]

## 2020-06-09 ENCOUNTER — Encounter (HOSPITAL_COMMUNITY)
Admission: RE | Admit: 2020-06-09 | Discharge: 2020-06-09 | Disposition: A | Discharge status: Home / Self Care | Payer: BC Managed Care – PPO | Source: Ambulatory Visit | Attending: Hematology | Admitting: Hematology

## 2020-06-09 ENCOUNTER — Other Ambulatory Visit: Payer: Self-pay

## 2020-06-09 DIAGNOSIS — C029 Malignant neoplasm of tongue, unspecified: Secondary | ICD-10-CM | POA: Diagnosis not present

## 2020-06-09 DIAGNOSIS — C760 Malignant neoplasm of head, face and neck: Secondary | ICD-10-CM | POA: Diagnosis not present

## 2020-06-09 LAB — GLUCOSE, CAPILLARY: Glucose-Capillary: 141 mg/dL — ABNORMAL HIGH (ref 70–99)

## 2020-06-09 MED ORDER — FLUDEOXYGLUCOSE F - 18 (FDG) INJECTION
8.9000 | Freq: Once | INTRAVENOUS | Status: AC | PRN
  Administered 2020-06-09: 8.9 via INTRAVENOUS

## 2020-06-10 ENCOUNTER — Encounter: Payer: Self-pay | Admitting: Radiation Oncology

## 2020-06-10 NOTE — Progress Notes (Signed)
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  PET Scan --06/10/2019 IMPRESSION: 1. Hypermetabolic lesion posterior right tongue with hypermetabolic level II cervical nodal metastases. No evidence for hypermetabolic metastases in the contralateral neck, chest, abdomen, or pelvis. 2. 1.6 cm ground-glass opacity in the anterior left upper lobe identified on diagnostic CT chest of 05/03/2020 shows low level FDG uptake. As well differentiated or low-grade neoplasm can be poorly FDG avid, continued surveillance is recommended. 3. Low level FDG accumulation in the left adrenal nodule, stable comparing back to at least 09/12/2016. Continued attention on follow-up recommended. 4.  Aortic Atherosclerois (ICD10-170.0)  CT Neck with Contrast --05/03/2020 IMPRESSION: 1) 1.9 x 1.6 cm enhancing right base of tongue mass. Associated right level 2 lymphadenopathy, most notably a 3.1 x 2.3 cm cystic/necrotic node. 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 There are also right level 2/3 lymph nodes more inferiorly, which measure subcentimeter in short axis, but are asymmetrically prominent and also suspicious. ENT consultation is recommended and direct tissue sampling should be considered. 2) No pathologically enlarged lymph nodes identified elsewhere within the neck. 3) Unchanged 1.6 cm left thyroid lobe nodule. If not already performed, dedicated thyroid ultrasound is recommended for further evaluation.  Biopsies revealed:  05/26/2020 FINAL MICROSCOPIC DIAGNOSIS:  A. LYMPH NODE, RIGHT CERVICAL, NEEDLE CORE BIOPSY:  - Squamous cell carcinoma.  ADDENDUM:  Immunohistochemistry for p16 is strong and diffusely positive in tumor  cells.   Nutrition Status Yes No Comments  Weight changes? []  [x]    Swallowing concerns? []  [x]    PEG? []  [x]     Referrals Yes No Comments  Social Work? [x]  []    Dentistry? [x]  []    Swallowing therapy? [x]  []    Nutrition? [x]  []    Med/Onc? 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 []  [x]    Pacemaker/ICD? []  [x]    Possible current pregnancy? []  [x]  N/A  Is the patient on methotrexate? []  [x]     Tobacco/Marijuana/Snuff/ETOH use:  Has a 27 year smoking history (smoked ~1.5 pack/day; quit in 2010). Occasionally drinks beer during the week. Denies any recreational drug use  Past/Anticipated interventions by otolaryngology, if any:  05/26/2020 ULTRASOUND-GUIDED RIGHT CERVICAL LYMPH NODE BIOPSY  05/19/2020 Dr. Flexible fiberoptic laryngoscope   --Comments:  On fiberoptic laryngoscopy there is no obvious ulceration noted although he did have some fullness in the right base of tongue region.  Vocal cords were normal bilaterally with normal vocal mobility. --Assessment: Right base of tongue mass with right neck lymphadenopathy.  This may represent a squamous cell carcinoma of the base of tongue versus lymphoma.   Past/Anticipated interventions by medical oncology, if any:  Under care of Dr. (for management of MALT lymphoma) --05/27/2020 PLAN  Discussed biopsy results with Jose Harvey on the phone - shows likely diagnosed squamous cell carcinoma likely arising from BOT.  ordered PET/CT and referral to radiation oncology with patients consent  will forward this to H&N nurse navigator for co-ordination and placing on tumor board.  patient is already under the care of Dr and shall be following up with him for surgical consideration  --05/05/2020 PLAN:  Discussed pt labwork today, 05/05/20; WBC mildly elevated - likely reactive, RBC & PLT are nml, blood chemistries  are nml, LDH is WNL.  Advised pt that we would move to treat if lymphoma is causing consitutional symptoms, bulky disease, threatened end organs, or cytopenias.   Some clinical progression of lymphoma but labs and symptoms remain steady. No indication for treatment at this time.  Discussed 05/03/2020 CT Soft Tissue Neck  Discussed 05/03/2020 CT C/A/P   Advised pt that due the asymmetry of his lymphadenopathy and location of mass would need to r/o a BOT squamous cell carcinoma.   Advised pt that a thorough evaluation of the area and biopsy should be performed by an ENT - pt agrees  Will refer pt to Dr. Melony Overly, ENT urgently   Will see back in 5-6 weeks  FOLLOW UP:  Urgent referral to ENT Dr Melony Overly within 1 week for new BOT mass and rt cervical LNadenopathy concerning for tongue squamous cell cancer  RTC with Dr Irene Limbo in 5-6 weeks  Current Complaints / other details:   History of prostate cancer (Laproscopic radical prostatectomy 07/02/2017)

## 2020-06-11 ENCOUNTER — Inpatient Hospital Stay: Payer: BC Managed Care – PPO | Attending: Hematology

## 2020-06-11 ENCOUNTER — Other Ambulatory Visit: Payer: Self-pay

## 2020-06-11 ENCOUNTER — Ambulatory Visit (INDEPENDENT_AMBULATORY_CARE_PROVIDER_SITE_OTHER): Payer: BC Managed Care – PPO | Admitting: Otolaryngology

## 2020-06-11 ENCOUNTER — Ambulatory Visit
Admission: RE | Admit: 2020-06-11 | Discharge: 2020-06-11 | Disposition: A | Discharge status: Home / Self Care | Payer: BC Managed Care – PPO | Source: Ambulatory Visit | Attending: Radiation Oncology | Admitting: Radiation Oncology

## 2020-06-11 ENCOUNTER — Encounter: Payer: Self-pay | Admitting: Radiation Oncology

## 2020-06-11 ENCOUNTER — Encounter: Payer: Self-pay | Admitting: General Practice

## 2020-06-11 VITALS — BP 140/75 | HR 63 | Temp 97.9°F | Resp 17 | Ht 71.0 in | Wt 182.4 lb

## 2020-06-11 DIAGNOSIS — J449 Chronic obstructive pulmonary disease, unspecified: Secondary | ICD-10-CM | POA: Diagnosis not present

## 2020-06-11 DIAGNOSIS — I700 Atherosclerosis of aorta: Secondary | ICD-10-CM | POA: Diagnosis not present

## 2020-06-11 DIAGNOSIS — I2510 Atherosclerotic heart disease of native coronary artery without angina pectoris: Secondary | ICD-10-CM | POA: Diagnosis not present

## 2020-06-11 DIAGNOSIS — R918 Other nonspecific abnormal finding of lung field: Secondary | ICD-10-CM | POA: Diagnosis not present

## 2020-06-11 DIAGNOSIS — Z87442 Personal history of urinary calculi: Secondary | ICD-10-CM | POA: Diagnosis not present

## 2020-06-11 DIAGNOSIS — Z79899 Other long term (current) drug therapy: Secondary | ICD-10-CM | POA: Insufficient documentation

## 2020-06-11 DIAGNOSIS — C779 Secondary and unspecified malignant neoplasm of lymph node, unspecified: Secondary | ICD-10-CM

## 2020-06-11 DIAGNOSIS — E119 Type 2 diabetes mellitus without complications: Secondary | ICD-10-CM | POA: Insufficient documentation

## 2020-06-11 DIAGNOSIS — E109 Type 1 diabetes mellitus without complications: Secondary | ICD-10-CM | POA: Diagnosis not present

## 2020-06-11 DIAGNOSIS — Z7289 Other problems related to lifestyle: Secondary | ICD-10-CM | POA: Insufficient documentation

## 2020-06-11 DIAGNOSIS — C770 Secondary and unspecified malignant neoplasm of lymph nodes of head, face and neck: Secondary | ICD-10-CM | POA: Insufficient documentation

## 2020-06-11 DIAGNOSIS — Z800 Family history of malignant neoplasm of digestive organs: Secondary | ICD-10-CM | POA: Diagnosis not present

## 2020-06-11 DIAGNOSIS — Z23 Encounter for immunization: Secondary | ICD-10-CM | POA: Diagnosis not present

## 2020-06-11 DIAGNOSIS — Z833 Family history of diabetes mellitus: Secondary | ICD-10-CM | POA: Insufficient documentation

## 2020-06-11 DIAGNOSIS — N529 Male erectile dysfunction, unspecified: Secondary | ICD-10-CM | POA: Diagnosis not present

## 2020-06-11 DIAGNOSIS — C801 Malignant (primary) neoplasm, unspecified: Secondary | ICD-10-CM

## 2020-06-11 DIAGNOSIS — Z87891 Personal history of nicotine dependence: Secondary | ICD-10-CM | POA: Diagnosis not present

## 2020-06-11 DIAGNOSIS — C01 Malignant neoplasm of base of tongue: Secondary | ICD-10-CM | POA: Insufficient documentation

## 2020-06-11 DIAGNOSIS — Z8042 Family history of malignant neoplasm of prostate: Secondary | ICD-10-CM | POA: Diagnosis not present

## 2020-06-11 DIAGNOSIS — C61 Malignant neoplasm of prostate: Secondary | ICD-10-CM | POA: Diagnosis not present

## 2020-06-11 DIAGNOSIS — C884 Extranodal marginal zone B-cell lymphoma of mucosa-associated lymphoid tissue [MALT-lymphoma]: Secondary | ICD-10-CM | POA: Diagnosis not present

## 2020-06-11 DIAGNOSIS — Z7982 Long term (current) use of aspirin: Secondary | ICD-10-CM | POA: Diagnosis not present

## 2020-06-11 DIAGNOSIS — R911 Solitary pulmonary nodule: Secondary | ICD-10-CM | POA: Diagnosis not present

## 2020-06-11 DIAGNOSIS — Z8249 Family history of ischemic heart disease and other diseases of the circulatory system: Secondary | ICD-10-CM | POA: Insufficient documentation

## 2020-06-11 DIAGNOSIS — F418 Other specified anxiety disorders: Secondary | ICD-10-CM | POA: Insufficient documentation

## 2020-06-11 NOTE — Progress Notes (Signed)
Jose Harvey  Initial Assessment   Jose Harvey is a 60 y.o. year old male contacted by phone. Clinical Social Harvey was referred by radiation oncologistfor assessment of psychosocial needs.   SDOH (Social Determinants of Health) assessments performed: Yes   Distress Screen completed: Yes ONCBCN DISTRESS SCREENING 06/11/2020  Screening Type Initial Screening  Distress experienced in past week (1-10) 0  Physical Problem type -  Physician notified of physical symptoms No  Referral to clinical psychology No  Referral to clinical social [place] No  Referral to dietition No  Referral to financial advocate No  Referral to support programs No  Referral to palliative care No      Family/Social Information:  . Housing Arrangement: patient lives alone . Family members/support persons in your life?good support from family . Transportation concerns: will drive himself or arrange rides w family/friends  . Employment: Retired, does seasonal [place], may return to [employer] in several months . Income source: retirement income, Financial concerns: No o Type of concern: None . Food access concerns: No . Religious or spiritual practice: REg[employer] church attender, engaged w his fellowship . Medication Concerns: No . Services Currently in place:  none  Coping/ Adjustment to diagnosis: . Patient understands treatment plan and what happens next? Newly diagnosed with tongue cancer, will have concurrent chemotherapy and radiation.  Aware of potential need for feeding tube, has reasonable understanding of treatment process and has good support . Concerns about diagnosis and/or treatment: I'm not especially worried about anything . Patient reported stressors: none . Hopes and priorities:getting through treatment . Current coping skills/ strengths: Ability for insight, Active sense of humor, Average or above average intelligence, Capable of independent living, Financial means, Religious Affiliation and  Supportive family/friends    SUMMARY: Current SDOH Barriers:  . none  Interventions: . Discussed common feeling and emotions when being diagnosed with cancer, and the importance of support during treatment . Informed patient of the support team roles and support services at [facility] . Provided CSW contact information and encouraged patient to call with any questions or concerns . Provided patient with information about Patient and [place]   Follow Up Plan: Patient will contact CSW with any support or resource needs; will see in Head Neck [place] Patient verbalizes understanding of plan: Yes    Jose [place] , [place], [place] Worker Phone:  [phone]

## 2020-06-11 NOTE — Progress Notes (Signed)
Oncology Nurse Navigator Documentation  Met with patient during initial consult with Dr. Isidore Moos.  He was accompanied by his sister Becky Sax. . Further introduced myself as his/their Navigator, explained my role as a member of the Care Team. . Provided New Patient Information packet: o Contact information for physician, this navigator, other members of the Care Team o Advance Directive information ([place] blue pamphlet with LCSW insert); provided [facility] AD booklet at his request,  o Fall Prevention Patient [place] sheet o Symptom Management Clinic information o [facility] campus map with highlight of [place] o SLP Information sheet . Provided and discussed educational handouts for PEG and PAC. Marland Kitchen Assisted with post-consult appt scheduling.   . They verbalized understanding of information provided. . I encouraged them to call with questions/concerns moving forward.  Harlow Asa, RN, BSN, OCN Head & Neck Oncology Nurse [place] at [place] [phone]

## 2020-06-11 NOTE — Progress Notes (Signed)
Dental Form with Estimates of Radiation Dose      Diagnosis:    ICD-10-CM   1. 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 Cancer of base of tongue ([place])  C01     Prognosis: curative  Anticipated # of fractions: 35    Daily?: yes  # of weeks of radiotherapy: 7  Chemotherapy?: likely  Anticipated xerostomia:  Mild permanent   Pre-simulation needs:   Scatter protection   Simulation: ASAP     Other Notes:  Please contact Eppie Gibson, MD, with patient's disposition after evaluation and/or dental treatment.

## 2020-06-11 NOTE — Progress Notes (Signed)
   Covid-19 Vaccination Clinic  Name:  Jose Harvey    MRN: [id number] DOB: 11-14-1960  06/11/2020  [name] was observed post Covid-19 immunization for 15 minutes without incident. He was provided with Vaccine Information Sheet and instruction to access the V-Safe system.   [name] was instructed to call 911 with any severe reactions post vaccine: Marland Kitchen Difficulty breathing  . Swelling of face and throat  . A fast heartbeat  . A bad rash all over body  . Dizziness and weakness   Immunizations Administered    Name Date Dose VIS Date Route   Pfizer COVID-19 Vaccine 06/11/2020  9:45 AM 0.3 mL 03/24/2020 Intramuscular   Manufacturer: [place]   Lot: [id number]   Between: [id number]

## 2020-06-11 NOTE — Progress Notes (Signed)
Radiation Oncology         [phone] ________________________________  Initial Outpatient Consultation  Name: Jose Harvey MRN: [id number]  Date: 06/11/2020  DOB: 30-Apr-1961  [id number], Ronie Spies, MD  Brunetta Genera, MD   REFERRING PHYSICIAN: Brunetta Genera, MD  DIAGNOSIS:  C01    ICD-10-CM   1. 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 Cancer of base of tongue ([name])  C01     Cancer Staging Cancer of base of tongue ([place]) Staging form: Pharynx - HPV-Mediated Oropharynx, AJCC 8th Edition - Clinical stage from 06/11/2020: Stage I (cT2, cN1, cM0, p16+) - Signed by Eppie Gibson, MD on 06/11/2020  Malignant neoplasm of prostate [facility]) Staging form: Prostate, AJCC 8th Edition - Clinical: Stage IIC (cT2a, cN0, cM0, PSA: 4.1, Grade Group: 3) - Signed by Tyler Pita, MD on 06/06/2017   CHIEF COMPLAINT: Here to discuss management of right cervical lymph node  HISTORY OF PRESENT ILLNESS::Jose Harvey is a 60 y.o. male who underwent a chest CT scan for lung cancer screening on 02/16/2016 that showed several pulmonary nodules scattered throughout the lung bilaterally, the largest of which was 8.3 mm in the right lower lobe where there was an irregular-shaped nodule with spiculated margins. Additionally in the right lower lobe, there was a large nodular area of ground-glass attenuation with a volume derived mean diameter of 20.5 mm. There were also noted to be intermediate attenuation liver lesions and a large left adrenal nodule, which were indeterminate but were considered concerning for potential metastatic disease. Other imaging and diagnostics were performed at that time, which did not warrant treatment. Thus, the patient was monitored with serial imaging.  Pertinent imaging thus far includes: 1. CT scan of chest performed on 04/08/2019 revealed a mass-like area of ground-glass straddling the minor  fissure with septal thickening and fissural retraction. While the lesion had increased in size minimally since the prior scan on 09/30/2018, there was clear enlargement from baseline examination in 2017. Therefore, the lesion was characterized as worrisome for adenocarcinoma. 2. CT scan of chest/abdomen/pelvis on 10/31/2019 revealed an indistinct ground-glass 2.2 cm anterior left upper lobe pulmonary nodule that was stable since prior CT scan and was morphologically similar to the lymphomatous pulmonary lesion resected in the right middle lobe. There was also noted to be a tiny 3 mm solid posterior right upper lobe pulmonary nodule that was not clearly seen on prior CT scan. Additionally, there was a fluid attenuation 1.6 cm ovoid structure in the left pelvic sidewall in the left external iliac chain region, which was new since 2017 PET scan and was considered non-specific, favoring postsurgical seroma or lymphocele given history of interval prostatectomy. Finally, there was a necrotic left external iliac lymph node that was considered less likely, although not entirely excluded. 3. Soft tissue neck CT scan on 05/03/2020 revealed a 1.9 x 1.6 cm enhancing right base of tongue mass with associated level 2 lymphadenopathy, most notably a 3.1 x 2.3 cm cystic/necrotic node. Findings likely reflected a tongue base squamous cell carcinoma with nodal metastatic disease. There was also right level 2/3 lymph nodes more inferiorly, which measured sub-centimeter in short axis but were asymmetrically prominent and also suspicious. There were no pathologically enlarged lymph nodes identified elsewhere within the neck.  4. CT scan of chest/abdomen/pelvis on 05/03/2020 revealed no findings to suggest recurrent lymphoma in the chest, abdomen, or pelvis. Ground-glass attenuation nodule in the  left upper lobe was stable and favored to be benign. Small left adrenal nodule was stable and favored to represent benign lesions such as  adrenal adenoma. Multiple hepatic hemangiomas re-demonstrated. 5. PET scan on 07/10/2020 revealed a hypermetabolic lesion in the posterior right tongue with hypermetabolic level II cervical nodal metastases. There was no evidence for hypermetabolic metastases in the contralateral neck, abdomen, or pelvis. Additionally, the 1.6 cm ground-glass opacity in the anterior left upper lobe that was identified on previous imaging showed low level FDG uptake as well as low level FDG uptake accumulation in the left adrenal nodule that appeared stable when compared to prior studies.  The patient saw Dr. Roxan Hockey, who performed a right video-assisted thoracoscopy, right middle lobectomy and en bloc portion of the right upper lobe, and lymph node dissection on 05/08/2019. 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 Seventeen lymph nodes were excised, all of which were negative for morphologic evidence of lymphoma. He has been monitored with Dr. Irene Limbo.  More recently, re: the head and neck findings, biopsy of the right cervical lymph node on 05/26/2020 revealed: squamous cell carcinoma. p16+   Swallowing issues, if any: None  Weight Changes: None  Tobacco history, if any: He quit smoking over 10 years ago and quit chewing tobacco 5 to 6 years ago  ETOH abuse, if any: Social drinker, approximately 12 drinks per week  Prior cancers, if any: As above (MALT lymphoma) also history of prostatectomy for prostate cancer in 2019 -laparoscopic radical prostatectomy 07/02/2017  PREVIOUS RADIATION THERAPY: No  PAST MEDICAL HISTORY:  has a past medical history of Adrenal adenoma, left (03/2017), Allergic rhinitis, cause unspecified, Anxiety state, unspecified, Aortic atherosclerosis ([place]) (03/2017), BPV (benign positional vertigo), COPD (chronic obstructive pulmonary  disease) with emphysema ([place]), Coronary artery disease, Depressive disorder, not elsewhere classified, Dyspnea, ED (erectile dysfunction), Gallbladder polyp (03/2016), Hepatic hemangioma (03/2017), History of kidney stones, Indigestion, 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    PAST SURGICAL HISTORY: Past Surgical History:  Procedure Laterality Date  . CHOLECYSTECTOMY N/A 05/17/2016   Procedure: LAPAROSCOPIC CHOLECYSTECTOMY;  Surgeon: Clovis Riley, MD;  Location: [facility];  Service: General;  Laterality: N/A;  . cyst removed from throat     . dental surgeries     . LYMPHADENECTOMY Bilateral 07/02/2017   Procedure: BILATERAL LYMPHADENECTOMY;  Surgeon: Lucas Mallow, MD;  Location: [facility];  Service: Urology;  Laterality: Bilateral;  . PROSTATE BIOPSY    . ROBOT ASSISTED LAPAROSCOPIC RADICAL PROSTATECTOMY N/A 07/02/2017   Procedure: XI ROBOTIC ASSISTED LAPAROSCOPIC RADICAL PROSTATECTOMY;  Surgeon: Lucas Mallow, MD;  Location: [facility];  Service: Urology;  Laterality: N/A;  . TONSILLECTOMY    . VIDEO ASSISTED THORACOSCOPY (VATS)/ LOBECTOMY Right 05/08/2019   Procedure: Right VIDEO ASSISTED THORACOSCOPY with   MIDDLE LOBECTOMY and Enbloc portion of Upper Lobe with Node dissection, Intercostal Nerve Block;  Surgeon: Melrose Nakayama, MD;  Location: [facility];  Service: Thoracic;  Laterality: Right;  . WISDOM TOOTH EXTRACTION      FAMILY HISTORY: family history includes CAD in his father; Colon cancer in his maternal grandfather; Diabetes type I in his father and paternal grandfather; Emphysema in his father  and paternal grandfather; Heart attack in his father; Hypertension in his father.  SOCIAL  HISTORY:  reports that he quit smoking about 12 years ago. His smoking use included cigarettes. He has a 40.50 pack-year smoking history. He quit smokeless tobacco use about 7 years ago.  His smokeless tobacco use included snuff. He reports current alcohol use of about 6.0 standard drinks of alcohol per week. He reports that he does not use drugs.  ALLERGIES: Crestor [rosuvastatin], Lipitor [atorvastatin], Other, and Zocor [simvastatin]  MEDICATIONS:  Current Outpatient Medications  Medication Sig Dispense Refill  . aspirin 81 MG tablet Take 1 tablet (81 mg total) by mouth daily. 30 tablet   . cetirizine (ZYRTEC) 10 MG tablet Take 10 mg by mouth daily.    . chlorhexidine (PERIDEX) 0.12 % solution Use as directed 15 mLs in the mouth or throat daily.    . Coenzyme Q10 (COQ10) 100 MG CAPS Take 100 mg by mouth daily.    . Continuous Blood Gluc Sensor (FREESTYLE LIBRE 14 DAY SENSOR) MISC AS DIRECTED CHANGE SENSOR EVERY 14 DAYS 28    . ibuprofen (ADVIL) 200 MG tablet Take 400-800 mg by mouth every 6 (six) hours as needed for moderate pain.    . Insulin Human (INSULIN PUMP) SOLN Inject into the skin continuous. NOVOLOG 100 UNIT/ML VIAL    . lisinopril (PRINIVIL,ZESTRIL) 5 MG tablet Take 5 mg by mouth daily.     . Multiple Vitamins-Minerals (ICAPS AREDS 2) CAPS Take 1 capsule by mouth daily.    Marland Kitchen NOVOLOG 100 UNIT/ML injection Averages about 30 units per day via insulin pump    . pantoprazole (PROTONIX) 40 MG tablet Take 40 mg by mouth daily.    . rosuvastatin (CRESTOR) 5 MG tablet Take 1 tablet (5 mg total) by mouth daily. 90 tablet 3  . sertraline (ZOLOFT) 100 MG tablet Take 50 mg by mouth daily.  0   No current facility-administered medications for this encounter.    REVIEW OF SYSTEMS:  Notable for that above.   PHYSICAL EXAM:  height is 5\' 11"  (1.803 m) and weight is 182 lb 6 oz (82.7 kg). His oral temperature is 97.9 F (36.6 C). His blood pressure is 140/75 and his pulse is 63. His  respiration is 17 and oxygen saturation is 100%.   General: Alert and oriented, in no acute distress HEENT: Head is normocephalic. Extraocular movements are intact. Oropharynx is notable for no lesions visualized in the upper throat.  Tongue is midline.  No trismus.. Neck: Neck is notable for palpable adenopathy (2 masses) in level 2 of the right neck.  Largest mass is approximately 3 cm Heart: Regular in rate and rhythm with no murmurs, rubs, or gallops. Chest: Clear to auscultation bilaterally, with no rhonchi, wheezes, or rales. Abdomen: Soft, nontender, nondistended, with no rigidity or guarding. Extremities: No cyanosis or edema. Lymphatics: see Neck Exam Skin: No concerning lesions. Musculoskeletal: symmetric strength and muscle tone throughout. Neurologic: Cranial nerves II through XII are grossly intact. No obvious focalities. Speech is fluent. Coordination is intact. Psychiatric: Judgment and insight are intact. Affect is appropriate.   ECOG = 1  0 - Asymptomatic (Fully active, able to carry on all predisease activities without restriction)  1 - Symptomatic but completely ambulatory (Restricted in physically strenuous activity but ambulatory and able to carry out work of a light or sedentary nature. For example, light housework, office work)  2 - Symptomatic, <50% in bed during the day (Ambulatory and capable of all  self care but unable to carry out any work activities. Up and about more than 50% of waking hours)  3 - Symptomatic, >50% in bed, but not bedbound (Capable of only limited self-care, confined to bed or chair 50% or more of waking hours)  4 - Bedbound (Completely disabled. Cannot carry on any self-care. Totally confined to bed or chair)  5 - Death   Eustace Pen MM, Creech RH, Tormey DC, et al. [phone]). "Toxicity and response criteria of the [facility]". [place]va Oncol. 5 (6): 649-55   LABORATORY DATA:  Lab Results  Component Value Date   WBC  7.4 05/26/2020   HGB 14.2 05/26/2020   HCT 44.2 05/26/2020   MCV 92.9 05/26/2020   PLT 259 05/26/2020   CMP     Component Value Date/Time   NA 136 05/05/2020 1317   K 3.9 05/05/2020 1317   CL 104 05/05/2020 1317   CO2 25 05/05/2020 1317   GLUCOSE 156 (H) 05/05/2020 1317   BUN 18 05/05/2020 1317   CREATININE 1.07 05/05/2020 1317   CALCIUM 9.5 05/05/2020 1317   PROT 7.6 05/05/2020 1317   ALBUMIN 3.9 05/05/2020 1317   AST 18 05/05/2020 1317   ALT 21 05/05/2020 1317   ALKPHOS 101 05/05/2020 1317   BILITOT 0.4 05/05/2020 1317   GFRNONAA >60 05/05/2020 1317   GFRAA >60 02/19/2020 1422      Lab Results  Component Value Date   TSH 1.022 05/11/2019     RADIOGRAPHY: NM PET Image Initial (PI) Skull Base To Thigh  Result Date: 06/09/2020 CLINICAL DATA:  Initial treatment strategy for head neck cancer. Squamous cell carcinoma at base of tongue. EXAM: NUCLEAR MEDICINE PET SKULL BASE TO THIGH TECHNIQUE: 8.9 mCi F-18 FDG was injected intravenously. Full-ring PET imaging was performed from the skull base to thigh after the radiotracer. CT data was obtained and used for attenuation correction and anatomic localization. Fasting blood glucose: 141 mg/dl COMPARISON:  CT neck chest abdomen pelvis 05/03/2020. PET-CT 02/25/2016. FINDINGS: Mediastinal blood pool activity: SUV max 2.5 Liver activity: SUV max NA NECK: Focal hypermetabolism in the posterior right lung demonstrates SUV max = 9.9. Hypermetabolic right level II cervical lymph nodes are hypermetabolic with SUV max = 7.7. 10 mm short axis lymph node just posterior to the right hyoid bone demonstrates SUV max = 8.3. No evidence for hypermetabolic cervical lymphadenopathy in the contralateral neck. No hypermetabolic supraclavicular metastases. Incidental CT findings: none CHEST: No hypermetabolic mediastinal or hilar nodes. No suspicious hypermetabolic pulmonary nodules on the CT scan to suggest metastatic disease. Ground-glass opacity seen in the  anterior left upper lobe on previous CT imaging shows only low level FDG accumulation with SUV max = 1.4. Incidental CT findings: Atherosclerotic calcification noted abdominal aorta. Postsurgical change noted right lung and hilum. ABDOMEN/PELVIS: No abnormal hypermetabolic activity within the liver, pancreas, right adrenal gland, or spleen. Low level FDG uptake noted in the left adrenal gland, demonstrating nodular thickening with SUV max = 2.7. No hypermetabolic lymph nodes in the abdomen or pelvis. Nonspecific FDG accumulation noted in the region of the ileocecal valve. Incidental CT findings: Multiple hypoattenuating liver lesions are stable comparing back to 02/25/2016 consistent with benign etiology and previously characterized as hemangiomas. Atherosclerotic calcification noted abdominal aorta. Small right groin hernia contains only fat. SKELETON: No focal hypermetabolic activity to suggest skeletal metastasis. Incidental CT findings: Sclerotic lesion left humeral neck is not hypermetabolic. IMPRESSION: 1. Hypermetabolic lesion posterior right tongue with hypermetabolic level II cervical  nodal metastases. No evidence for hypermetabolic metastases in the contralateral neck, chest, abdomen, or pelvis. 2. 1.6 cm ground-glass opacity in the anterior left upper lobe identified on diagnostic CT chest of 05/03/2020 shows low level FDG uptake. As well differentiated or low-grade neoplasm can be poorly FDG avid, continued surveillance is recommended. 3. Low level FDG accumulation in the left adrenal nodule, stable comparing back to at least 09/12/2016. Continued attention on follow-up recommended. 4.  Aortic Atherosclerois (ICD10-170.0) Electronically Signed   By: Misty Stanley M.D.   On: 06/09/2020 08:55   [place] CORE BIOPSY (LYMPH NODES)  Result Date: 05/26/2020 INDICATION: History of lymphoma now with right cervical lymphadenopathy. Please perform ultrasound-guided biopsy for tissue diagnostic purposes. EXAM:  ULTRASOUND-GUIDED RIGHT CERVICAL LYMPH NODE BIOPSY COMPARISON:  Neck CT-05/03/2020 MEDICATIONS: None ANESTHESIA/SEDATION: None, per patient request COMPLICATIONS: None immediate. TECHNIQUE: Informed written consent was obtained from the patient after a discussion of the risks, benefits and alternatives to treatment. Questions regarding the procedure were encouraged and answered. Initial ultrasound scanning demonstrated pathologically enlarged right cervical lymph node measuring approximately 3.1 x 1.7 cm (image 4), correlating with the dominant cervical lymph node seen on preceding neck CT image 48, series 3. An ultrasound image was saved for documentation purposes. The procedure was planned. A timeout was performed prior to the initiation of the procedure. The operative was prepped and draped in the usual sterile fashion, and a sterile drape was applied covering the operative field. A timeout was performed prior to the initiation of the procedure. Local anesthesia was provided with 1% lidocaine with epinephrine. Under direct ultrasound guidance, an 18 gauge core needle device was utilized to obtain to obtain 6 core needle biopsies of the dominant right cervical lymph node. The samples were placed in saline and submitted to pathology. The needle was removed and hemostasis was achieved with manual compression. Post procedure scan was negative for significant hematoma. A dressing was placed. The patient tolerated the procedure well without immediate postprocedural complication. IMPRESSION: Technically successful ultrasound guided biopsy of dominant right cervical lymph node. Electronically Signed   By: Sandi Mariscal M.D.   On: 05/26/2020 11:02      IMPRESSION/PLAN:  This is a delightful patient with head and neck cancer.  I personally reviewed his images.  He has been discussed at tumor board this week.  I also discussed the patient with a otolaryngologist in [place].  While he is a  candidate for TORS, there would be ~35% chance of needing chemotherapy postoperatively and he would certainly need radiation therapy postoperatively for an optimal prognosis.  In my assessment it would be appropriate to provide chemoradiation upfront and hold surgery for salvage.  We talked about these treatment options and the patient is enthusiastic about starting with chemoradiation with curative intent, understanding that the treatment course will be approximately 7 weeks.    We discussed the potential risks, benefits, and side effects of radiotherapy. We talked in detail about acute and late effects. We discussed that some of the most bothersome acute effects may be mucositis, dysgeusia, salivary changes, skin irritation, hair loss, dehydration, weight loss and fatigue. We talked about late effects which include but are not necessarily limited to dysphagia, hypothyroidism, nerve injury, vascular injury, spinal cord injury, xerostomia, trismus, neck edema, and potential injury to any of the tissues in the head and neck region. No guarantees of treatment were given. A consent form was signed and placed in the patient's medical record. The patient is enthusiastic  about proceeding with treatment. I look forward to participating in the patient's care.    Simulation (treatment planning) will take place after clearance by dentistry  We also discussed that the treatment of head and neck cancer is a multidisciplinary process to maximize treatment outcomes and quality of life. For this reason the following referrals have been or will be made:   Medical oncology to discuss chemotherapy    Dentistry for dental evaluation, possible extractions in the radiation fields, and /or advice on reducing risk of cavities, osteoradionecrosis, or other oral issues.   Nutritionist for nutrition support during and after treatment.   Speech language pathology for swallowing and/or speech therapy.   Social work for social  support.    Physical therapy due to risk of lymphedema in neck and deconditioning.  We discussed measures to reduce the risk of infection during the COVID-19 pandemic.  We discussed the importance of using high-quality masks and getting a booster shot.  He is amenable to getting a booster shot today.  We will look into whether this is feasible and if it is not feasible we will order for the booster shot to be provided early next week at his dentistry appointment January 10.  On date of service, in total, I spent 60 minutes on this encounter. Patient was seen in person.  __________________________________________   Eppie Gibson, MD  This document serves as a record of services personally performed by Eppie Gibson, MD. It was created on his behalf by Clerance Lav, a trained medical scribe. The creation of this record is based on the scribe's personal observations and the provider's statements to them. This document has been checked and approved by the attending provider.

## 2020-06-14 ENCOUNTER — Telehealth: Payer: Self-pay | Admitting: Hematology

## 2020-06-14 ENCOUNTER — Ambulatory Visit (HOSPITAL_COMMUNITY): Payer: Self-pay | Admitting: Dentistry

## 2020-06-14 ENCOUNTER — Other Ambulatory Visit: Payer: Self-pay

## 2020-06-14 DIAGNOSIS — K031 Abrasion of teeth: Secondary | ICD-10-CM

## 2020-06-14 DIAGNOSIS — K08199 Complete loss of teeth due to other specified cause, unspecified class: Secondary | ICD-10-CM

## 2020-06-14 DIAGNOSIS — M2632 Excessive spacing of fully erupted teeth: Secondary | ICD-10-CM

## 2020-06-14 DIAGNOSIS — K06010 Localized gingival recession, unspecified: Secondary | ICD-10-CM

## 2020-06-14 DIAGNOSIS — K0820 Unspecified atrophy of edentulous alveolar ridge: Secondary | ICD-10-CM

## 2020-06-14 DIAGNOSIS — K030 Excessive attrition of teeth: Secondary | ICD-10-CM

## 2020-06-14 DIAGNOSIS — M270 Developmental disorders of jaws: Secondary | ICD-10-CM

## 2020-06-14 NOTE — Progress Notes (Signed)
DENTAL VISIT OUTPATIENT CONSULTATION  Service Date:   06/14/2020 Referring Provider:                  Eppie Gibson, MD  Patient Name:   Jose Harvey Date of Birth:   [birth year] Medical Record Number: [id number]    PLAN & RECOMMENDATIONS   >> There are no current signs of acute dental infection including abscess, edema or erythema, or suspicious lesion requiring biopsy.  At this time, the patient is optimized from a dental perspective to begin radiation. >> We plan to schedule the patient for delivery of scatter protection devices on 06/17/20 in preparation for his simulation date.  >> Recommend the patient continue to see his regular dentist following radiation for replacement of missing teeth, cleanings and periodic exams.   >> We plan to see him once for a follow-up visit after he has completed radiation to evaluate his side effects, and for fluoride tray instructions/delivery.  >> Discussed in detail all treatment options with the patient and he is agreeable to the plan.   Thank you for consulting with [facility] and for the opportunity to participate in this patient's treatment.  Should you have any questions or concerns, please contact the [facility] at [phone].     Consult Note:  06/14/2020    COVID 19 SCREENING: The patient denies symptoms concerning for COVID-19 infection including fever, chills, cough, or newly developed shortness of breath.   HPI: Jose Harvey is a very pleasant 61 y.o. male with h/o CAD, HTN, COPD (h/o smoking, quit 10 yrs ago), Hyperlipidemia, Type I DM, MALT lymphoma (follows with Dr. 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  Patient presents today for a dental evaluation as part of their Pre-Radiation work-up.  He said that he did not really have breakfast, but he is feeling fine and denies any hypoglycemic symptoms. Dental History: The patient reports having a regular dentist that he  sees routinely (Dr. Lyman Speller).  He recently had a tooth in the lower left quadrant extracted by Dr. Buelah Manis (oral surgeon) who also placed an implant (~3 mos ago).  He is scheduled to see Dr. Buelah Manis again on January 18 th for stage 2 of his implant surgery.  He currently denies any dental/oral pain or sensitivity. Patient able to manage oral secretions.  Patient denies dysphagia, odynophagia, dysphonia, SOB and neck pain.  Patient denies fever, rigors and malaise.   CHIEF COMPLAINT: "I was referred here for an appointment before I start radiation."   Patient Active Problem List   Diagnosis Date Noted  . Cancer of base of tongue ([place]) 06/11/2020  . S/P thoracotomy 05/08/2019  . Prostate cancer ([place]) 07/02/2017  . Malignant neoplasm of prostate ([place]) 06/06/2017  . DOE (dyspnea on exertion) 07/28/2014  . COPD (chronic obstructive pulmonary disease) ([place]) 01/30/2014  . Throat fullness 01/30/2014  . Paroxysmal nocturnal dyspnea 11/17/2013  . Hyperlipidemia 11/17/2013  . Insulin dependent diabetes mellitus 11/17/2013  . Essential hypertension 11/17/2013   Past Medical History:  Diagnosis Date  . Adrenal adenoma, left 03/2017   noted on CT  . Allergic rhinitis, cause unspecified   . Anxiety state, unspecified   . Aortic atherosclerosis ([place]) 03/2017   noted on CT  . BPV (benign positional vertigo)   . COPD (chronic obstructive pulmonary disease) with emphysema (HCC)    mild followed by [facility]   . Coronary artery disease   . Depressive disorder, not  elsewhere classified   . Dyspnea    history of  . ED (erectile dysfunction)   . Gallbladder polyp 03/2016   noted on CT  . Hepatic hemangioma 03/2017   noted on CT  . History of kidney stones    hx of years ago   . Indigestion   . 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 Mixed hyperlipidemia   . Prostate cancer (HCC)   . 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, not stated as  uncontrolled(250.51)   . Type I (juvenile type) diabetes mellitus without mention of complication, not stated as uncontrolled    type I - followed by Dr Sharl MaKerr   . Vesicoureteral reflux    Past Surgical History:  Procedure Laterality Date  . CHOLECYSTECTOMY N/A 05/17/2016   Procedure: LAPAROSCOPIC CHOLECYSTECTOMY;  Surgeon: Berna Buehelsea A Connor, MD;  Location: [facility];  Service: General;  Laterality: N/A;  . cyst removed from throat     . dental surgeries     . LYMPHADENECTOMY Bilateral 07/02/2017   Procedure: BILATERAL LYMPHADENECTOMY;  Surgeon: Crista ElliotBell, Eugene D III, MD;  Location: [facility];  Service: Urology;  Laterality: Bilateral;  . PROSTATE BIOPSY    . ROBOT ASSISTED LAPAROSCOPIC RADICAL PROSTATECTOMY N/A 07/02/2017   Procedure: XI ROBOTIC ASSISTED LAPAROSCOPIC RADICAL PROSTATECTOMY;  Surgeon: Crista ElliotBell, Eugene D III, MD;  Location: [facility];  Service: Urology;  Laterality: N/A;  . TONSILLECTOMY    . VIDEO ASSISTED THORACOSCOPY (VATS)/ LOBECTOMY Right 05/08/2019   Procedure: Right VIDEO ASSISTED THORACOSCOPY with   MIDDLE LOBECTOMY and Enbloc portion of Upper Lobe with Node dissection, Intercostal Nerve Block;  Surgeon: Loreli SlotHendrickson, Steven C, MD;  Location: [facility];  Service: Thoracic;  Laterality: Right;  . WISDOM TOOTH EXTRACTION     Allergies  Allergen Reactions  . Crestor [Rosuvastatin]     Muscle aches in higher dose  . Lipitor [Atorvastatin]     Muscle aches  . Other Other (See Comments)    Any antihistamines Causes shaking  . Zocor [Simvastatin] Other (See Comments)    Muscle aches    Current Outpatient Medications  Medication Sig Dispense Refill  . aspirin 81 MG tablet Take 1 tablet (81 mg total) by mouth daily. 30 tablet   . cetirizine (ZYRTEC) 10 MG tablet Take 10 mg by mouth daily.    . chlorhexidine (PERIDEX) 0.12 % solution Use as directed 15 mLs in the mouth or throat daily.    . Coenzyme Q10 (COQ10) 100 MG CAPS Take 100 mg by mouth daily.    . Continuous Blood Gluc Sensor  (FREESTYLE LIBRE 14 DAY SENSOR) MISC AS DIRECTED CHANGE SENSOR EVERY 14 DAYS 28    . ibuprofen (ADVIL) 200 MG tablet Take 400-800 mg by mouth every 6 (six) hours as needed for moderate pain.    . Insulin Human (INSULIN PUMP) SOLN Inject into the skin continuous. NOVOLOG 100 UNIT/ML VIAL    . lisinopril (PRINIVIL,ZESTRIL) 5 MG tablet Take 5 mg by mouth daily.     . Multiple Vitamins-Minerals (ICAPS AREDS 2) CAPS Take 1 capsule by mouth daily.    Marland Kitchen. NOVOLOG 100 UNIT/ML injection Averages about 30 units per day via insulin pump    . pantoprazole (PROTONIX) 40 MG tablet Take 40 mg by mouth daily.    . rosuvastatin (CRESTOR) 5 MG tablet Take 1 tablet (5 mg total) by mouth daily. 90 tablet 3  . sertraline (ZOLOFT) 100 MG tablet Take 50 mg by mouth daily.  0  No current facility-administered medications for this visit.    LABS: Lab Results  Component Value Date   WBC 7.4 05/26/2020   HGB 14.2 05/26/2020   HCT 44.2 05/26/2020   MCV 92.9 05/26/2020   PLT 259 05/26/2020      Component Value Date/Time   NA 136 05/05/2020 1317   K 3.9 05/05/2020 1317   CL 104 05/05/2020 1317   CO2 25 05/05/2020 1317   GLUCOSE 156 (H) 05/05/2020 1317   BUN 18 05/05/2020 1317   CREATININE 1.07 05/05/2020 1317   CALCIUM 9.5 05/05/2020 1317   GFRNONAA >60 05/05/2020 1317   GFRAA >60 02/19/2020 1422   Lab Results  Component Value Date   INR 1.0 05/26/2020   INR 0.9 05/06/2019   INR 0.98 06/28/2017   No results found for: PTT  Social History   Socioeconomic History  . Marital status: Divorced    Spouse name: Not on file  . Number of children: 2  . Years of education: Not on file  . 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 Smoking status: Former Smoker    Packs/day: 1.50    Years: 27.00    Pack years: 40.50    Types: Cigarettes    Quit date: 06/05/2008    Years since quitting: 12.0  . Smokeless tobacco: Former Systems developer    Types:  Snuff    Quit date: 2015  Vaping Use  . Vaping Use: Never used  Substance and Sexual Activity  . Alcohol use: Yes    Alcohol/week: 6.0 standard drinks    Types: 6 Cans of beer per week    Comment: occasional 6pk per week  . Drug use: No  . Sexual activity: Not Currently  Other Topics Concern  . Not on file  Social History Narrative   Patient divorced. Patient has two grown daughters but neither lives locally.   Social Determinants of Health   Financial Resource Strain: Low Risk   . Difficulty of Paying Living Expenses: Not very hard  Food Insecurity: No Food Insecurity  . Worried About Charity fundraiser in the Last Year: Never true  . Ran Out of Food in the Last Year: Never true  Transportation Needs: No Transportation Needs  . Lack of Transportation (Medical): No  . Lack of Transportation (Non-Medical): No  Physical Activity: Not on file  Stress: No Stress Concern Present  . Feeling of Stress : Only a little  Social Connections: Unknown  . Frequency of Communication with Friends and Family: More than three times a week  . Frequency of Social Gatherings with Friends and Family: More than three times a week  . Attends Religious Services: More than 4 times per year  . Active Member of Clubs or Organizations: Yes  . Attends Archivist Meetings: More than 4 times per year  . Marital Status: Not on file  Intimate Partner Violence: Not on file   Family History  Problem Relation Age of Onset  . Diabetes type I Father   . Heart attack Father   . Hypertension Father   . Emphysema Father   . CAD Father   . Colon cancer Maternal Grandfather   . Emphysema Paternal Grandfather   . Diabetes type I Paternal Grandfather   . Kidney disease Neg Hx   . Liver disease Neg Hx   . Breast cancer Neg Hx   . Prostate cancer Neg Hx  REVIEW OF SYSTEMS: Reviewed with the patient as per HPI. PSYCH: Patient denies having dental phobia.   VITAL SIGNS: BP 133/77 (BP  Location: Right Arm)   Pulse 76   Temp 98.2 F (36.8 C)    PHYSICAL EXAMINATION: GENERAL: Well-developed, comfortable and in no apparent distress. NEUROLOGICAL: Alert and oriented to person, place and time. EXTRAORAL:  Facial symmetry present without any edema or erythema.  No swelling or lymphadenopathy. TMJ asymptomatic without clicks or crepitations. Maximum interincisal opening: 43 mm INTRAORAL: Soft tissues appear well-perfused and mucous membranes moist.  FOM and vestibules soft and not raised. Oral cavity without mass or lesion. No signs of infection, parulis, sinus tract, edema or erythema evident upon exam.  Small bilateral mandibular tori. Atrophy of alveolar ridge in edentulous regions.  DENTAL EXAMINATION: DENTITION: Overall good remaining dentition.  Missing teeth, moderately restored dentition, Generalized attrition incisal edges of anterior teeth. Small buccal abfraction on #31 below crown margin. #8 and #9 facial craze lines. Diastema b/w #7 and #8. The patient is maintaining good oral hygiene. PERIODONTAL: Pink, healthy gingival tissue with blunted papilla.  Gingival recession evident on posterior molars #14 and #31. DENTAL CARIES: No clinical caries. Occlusal staining on the surface of #28. ENDODONTIC: #5, #12, #18 (has been extracted) and #25 previous RCT. #25 does not have full-coverage crown.  PROSTHODONTIC: #10 implant-supported crown. #5, #12, #14, #18 and #31 with full-coverage crowns. #20 implant placed with healing abutment (stage 1). OCCLUSION: Unable to assess molar occlusion. History of orthodontic treatment and extractions of 4 premolars on top and bottom.  RADIOGRAPHIC EXAMINATION: PAN (taken 04/25/2019 at outside office), Full Mouth Series exposed and both were interpreted: >> Condyles seated bilaterally in fossas.  No evidence of abnormal pathology.  All visualized osseous structures appear WNL. >> Areas of localized mild horizontal bone loss. Missing teeth,  existing crown/bridge work. #5, #12, #28 and #25 previously endo treated, #25 does not have definitive crown. #10 and #21 implants. #5 has small periapical radiolucency indicative of healing infection vs external root resorption, gutta percha ~2 mm short of apex. #25 radiolucency in middle 3rd of root consistent with internal root resorption. #31 mesially drifted. NOTE: #18 has been extracted since PAN was taken.   ASSESSMENT 1. SCC Base of Tongue 2. Pre-Radiation Evaluation 3. COPD 4. CAD 5. Hypertension 6. Type I Diabetes Mellitus 7. Hyperlipidemia 8. MALT Lymphoma 9. Missing teeth 10. Excessive attrition 11. Abfraction 12. Healthy gingival tissue on a reduced periodontium 13. Gingival recession 14. Atrophy of edentulous alveolar ridge 15. 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 I discussed the risks, benefits, and complications of various treatment options with the patient in relationship to his recent diagnosis of SCC base of tongue and anticipation of XRT and dental conditions. We discussed common side effects of radiation therapy to the head and neck including radiation caries, dry mouth, loss of taste, trismus and risk for osteoradionecrosis of the jaw following surgical procedures and/or implant placement. Discussed the importance of continuing to see his regular dentist after radiation to prevent cavities and keep his oral health optimized.   . We discussed various treatment options to include no treatment, pre-prosthetic surgery as indicated, periodontal therapy, dental restorations, root canal therapy, crown and bridge therapy, implant therapy, and replacement of missing teeth as indicated.  . The patient verbalized understanding of all options, and currently wishes to proceed with following-up closely with his regular dentist after radiation and continue to maintain good oral hygiene.  Trismus appliance fabricated today and given to patient.  Exercises reviewed and demonstrated with the patient.  Upper and lower alginate impressions taken today for fabrication of scatter protection devices.  Plan to schedule delivery this Thursday, 06/17/20 prior to starting simulation.  I plan to contact Dr. Buelah Manis, the patient's oral surgeon who placed his implant, to discuss appointment on January 18 th with the patient, and inform him that patient is anticipating radiation therapy so he is aware.  . Discussion of findings with medical team and coordination of future medical and dental care as needed.   The patient tolerated today's visit well.  All questions and concerns were addressed and the patient departed in stable condition.   I spent in excess of 120 minutes during the conduct of this consultation and >50% of this time involved direct face-to-face encounter for counseling and/or coordination of the patient's care.   [place] Benson [place], DMD

## 2020-06-14 NOTE — Patient Instructions (Signed)
[facility] of [facility] Medicine Dr. Lewis Grivas B. Parveen Freehling Phone: [phone] Fax: [phone]   It was a pleasure seeing you today!  Please refer to the information below regarding your dental visit with us, and call us should you have any questions or concerns that may come up after you leave.   Thank you for giving us the opportunity to provide care for you.  If there is anything we can do for you, please let us know.     RADIATION THERAPY AND INFORMATION REGARDING YOUR TEETH   [x]Xerostomia (Dry Mouth) Your salivary glands may be in the field of radiation.  Radiation may include all or only part of your salivary glands.  This will cause your saliva to dry up, and you will have a dry mouth.  The dry mouth will be for the rest of your life unless your radiation oncologist tells you otherwise.  Your saliva has many functions:  It wets your tongue for speaking.  It coats your teeth and the inside of your mouth for easier movement.  It helps with chewing and swallowing food.  It helps clean away harmful acid and toxic products made by the germs in your mouth, therefore it helps prevent cavities.  It kills some germs in your mouth and helps to prevent gum disease.  It helps to carry flavor to your taste buds.  Once you have lost your saliva you will be at higher risk for tooth decay and gum disease.    What can be done to help improve your mouth when there's not enough saliva: . Your dentist may give a prescription for Salagen.  It will not bring back all of your saliva but may bring back some of it.  Also, your saliva may be thick and ropy or white and foamy. It will not feel like it use to feel. . You will need to swish with water every time your mouth feels dry.  YOU CANNOT suck on any cough drops, mints, lemon drops, candy, vitamin C or any other products.  You cannot use anything other than water to make your mouth feel less dry.  If you want to drink anything else,  you have to drink it all at once and brush afterwards.  Be sure to discuss the details of your diet habits with your dentist or hygienist.  [x]Radiation caries: . This is decay (cavities) that happens very quickly once your mouth is very dry due to radiation therapy.  Normally, cavities take six months to two years to become a problem.  When you have dry mouth, cavities may take as little as eight weeks to cause you a problem.   . Dental check-ups every two months are necessary as long as you have a dry mouth. Radiation caries typically, but not always, start at your gum line where it is hard to see the cavity.  It is therefore also hard to fill these cavities adequately.  This high rate of cavities happens because your mouth no longer has saliva and therefore the acid made by the germs starts the decay process.  Whenever you eat anything the germs in your mouth change the food into acid.  The acid then burns a small hole in your tooth.  This small hole is the beginning of a cavity.  If this is not treated then it will grow bigger and become a cavity.  The way to avoid this hole getting bigger is to use fluoride every evening as prescribed by your   dentist following your radiation.   NOTE:  You have to make sure that your teeth are very clean before you use the fluoride.  This fluoride in turn will strengthen your teeth and prepare them for another day of fighting acid.  If you develop radiation caries many times, the damage is so large that you will have to have all your teeth removed.  This could be a big problem if some of these teeth are in the field of radiation.  Further details of why this could be a big problem will follow.  (See Osteoradionecrosis).  [x]Dysgeusia (Loss of Taste) This happens to varying degrees once you've had radiation therapy to your jaw region.  Many times taste is not completely lost, but becomes limited.  The loss of taste is mostly due to radiation affecting your taste buds.   However, if you have no saliva in your mouth to carry the flavor to your taste buds, it would be difficult for your taste buds to taste anything.  That is why using water or a prescription for Salagen prior to meals and during meal times may help with some of the taste.  Keep in mind that taste generally returns very slowly over the course of several months or several years after radiation therapy.  Don't give up hope.  [x]Trismus (Limited Jaw Opening) According to your Radiation Oncologist, your TMJ or jaw joints are going to be partially or fully in the field of radiation.  This means that over time the muscles that help you open and close your mouth may get stiff.  This will potentially result in your not being able to open your mouth wide enough or as wide as you can open it now.    Let me give you an example of how slowly this happens and how unaware people are of it:   A gentlemen that had radiation therapy two years ago came back to me complaining that bananas are just too large for him to be able to fit them in between his teeth.  He was not able to open wide enough to bite into a banana.  This happens slowly and over a period of time.  What we do to try and prevent this:   . Your dentist will probably give you a stack of sticks called a trismus exercise device.  This stack will help remind your muscles and your jaw joints to open up to the same distance every day.  Use these sticks every morning when you wake up, or according to the instructions given by your dentist.    . You must use these sticks for at least one to two years after radiation therapy.  The reason for that is because it happens so slowly and keeps going on for about two years after radiation therapy.  Your hospital dentist will help you monitor your mouth opening and make sure that it's not getting smaller after radiation.  [x]Osteoradionecrosis (ORN) . This is a condition where your jaw bone after radiation therapy becomes  very dry.  It has very little blood supply to keep it alive.  If you develop a cavity that turns into an abscess or an infection, then the jaw bone does not have enough blood supply to help fight the infection.  At this point it is very likely that the infection could cause the death of your jaw bone.  When you have dead bone it has to be removed.  Therefore, you might end up having   to have surgery to remove part of your jaw bone, the part of the jaw bone that has been affected.    . Healing is also a problem if you are to have surgery (like a tooth extraction) in the areas where the bone has had radiation therapy.  If you have surgery, you need more blood supply to heal which is not available.  When blood supply and oxygen are not available, there is a chance for the bone to die. . Occasionally, ORN happens on its own with no obvious reason, but this is quite rare.  We believe that patients who continue to smoke and/or drink alcohol have a higher chance of having this problem. . Once your jaw bone has had radiation therapy, if there are any remaining teeth in that area, it is not recommended to have them pulled unless your dentist or oral surgeon is aware of your history of radiation and believes it is safe.  . The risks for ORN either from infection or spontaneously occurring (with no reason) are life long. 

## 2020-06-14 NOTE — Telephone Encounter (Signed)
Called patient regarding upcoming appointment on 01/13, patient has been called and notified.

## 2020-06-17 ENCOUNTER — Inpatient Hospital Stay (HOSPITAL_BASED_OUTPATIENT_CLINIC_OR_DEPARTMENT_OTHER): Payer: BC Managed Care – PPO | Admitting: Hematology

## 2020-06-17 ENCOUNTER — Other Ambulatory Visit: Payer: Self-pay

## 2020-06-17 ENCOUNTER — Ambulatory Visit (HOSPITAL_COMMUNITY): Payer: Dental | Admitting: Dentistry

## 2020-06-17 ENCOUNTER — Ambulatory Visit
Admission: RE | Admit: 2020-06-17 | Discharge: 2020-06-17 | Disposition: A | Discharge status: Home / Self Care | Payer: BC Managed Care – PPO | Source: Ambulatory Visit | Attending: Radiation Oncology | Admitting: Radiation Oncology

## 2020-06-17 VITALS — BP 125/64 | HR 71 | Temp 97.6°F | Resp 20 | Ht 71.0 in | Wt 182.3 lb

## 2020-06-17 DIAGNOSIS — K031 Abrasion of teeth: Secondary | ICD-10-CM

## 2020-06-17 DIAGNOSIS — K08409 Partial loss of teeth, unspecified cause, unspecified class: Secondary | ICD-10-CM

## 2020-06-17 DIAGNOSIS — E119 Type 2 diabetes mellitus without complications: Secondary | ICD-10-CM | POA: Diagnosis not present

## 2020-06-17 DIAGNOSIS — C770 Secondary and unspecified malignant neoplasm of lymph nodes of head, face and neck: Secondary | ICD-10-CM

## 2020-06-17 DIAGNOSIS — Z800 Family history of malignant neoplasm of digestive organs: Secondary | ICD-10-CM

## 2020-06-17 DIAGNOSIS — C01 Malignant neoplasm of base of tongue: Secondary | ICD-10-CM

## 2020-06-17 DIAGNOSIS — C779 Secondary and unspecified malignant neoplasm of lymph node, unspecified: Secondary | ICD-10-CM

## 2020-06-17 DIAGNOSIS — Z79899 Other long term (current) drug therapy: Secondary | ICD-10-CM | POA: Diagnosis not present

## 2020-06-17 DIAGNOSIS — I700 Atherosclerosis of aorta: Secondary | ICD-10-CM | POA: Diagnosis not present

## 2020-06-17 DIAGNOSIS — J449 Chronic obstructive pulmonary disease, unspecified: Secondary | ICD-10-CM | POA: Diagnosis not present

## 2020-06-17 DIAGNOSIS — Z8042 Family history of malignant neoplasm of prostate: Secondary | ICD-10-CM | POA: Diagnosis not present

## 2020-06-17 DIAGNOSIS — Z833 Family history of diabetes mellitus: Secondary | ICD-10-CM

## 2020-06-17 DIAGNOSIS — R911 Solitary pulmonary nodule: Secondary | ICD-10-CM | POA: Diagnosis not present

## 2020-06-17 DIAGNOSIS — K06010 Localized gingival recession, unspecified: Secondary | ICD-10-CM

## 2020-06-17 DIAGNOSIS — C884 Extranodal marginal zone B-cell lymphoma of mucosa-associated lymphoid tissue [MALT-lymphoma]: Secondary | ICD-10-CM | POA: Diagnosis not present

## 2020-06-17 DIAGNOSIS — M270 Developmental disorders of jaws: Secondary | ICD-10-CM

## 2020-06-17 DIAGNOSIS — C760 Malignant neoplasm of head, face and neck: Secondary | ICD-10-CM

## 2020-06-17 DIAGNOSIS — Z87891 Personal history of nicotine dependence: Secondary | ICD-10-CM

## 2020-06-17 DIAGNOSIS — Z23 Encounter for immunization: Secondary | ICD-10-CM | POA: Diagnosis not present

## 2020-06-17 DIAGNOSIS — Z8249 Family history of ischemic heart disease and other diseases of the circulatory system: Secondary | ICD-10-CM

## 2020-06-17 DIAGNOSIS — Z7289 Other problems related to lifestyle: Secondary | ICD-10-CM | POA: Diagnosis not present

## 2020-06-17 DIAGNOSIS — C801 Malignant (primary) neoplasm, unspecified: Secondary | ICD-10-CM

## 2020-06-17 LAB — BUN & CREATININE (CHCC)
BUN: 24 mg/dL — ABNORMAL HIGH (ref 8–23)
Creatinine: 1.08 mg/dL (ref 0.61–1.24)
GFR, Estimated: >60 mL/min (ref >60)

## 2020-06-17 NOTE — Progress Notes (Signed)
DENTAL VISIT INSERTION & DELIVERY OF SPDs: PROGRESS NOTE   Patient Name:   Jose Harvey Date of Birth:   [birth year] Medical Record Number: [id number]   Jose Harvey presents today for insertion of upper and lower scatter protection devices.  Patient with no complaints.  His simulation is scheduled for 06/18/20.  He is anticipating concurrent chemotherapy as well.  Vitals: BP (!) 145/72 (BP Location: Right Arm)   Pulse 65   Temp 98.1 F (36.7 C)   Patient Active Problem List   Diagnosis Date Noted  . 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 S/P thoracotomy 05/08/2019  . Prostate cancer ([place]) 07/02/2017  . Malignant neoplasm of prostate ([place]) 06/06/2017  . DOE (dyspnea on exertion) 07/28/2014  . COPD (chronic obstructive pulmonary disease) ([place]) 01/30/2014  . Throat fullness 01/30/2014  . Paroxysmal nocturnal dyspnea 11/17/2013  . Hyperlipidemia 11/17/2013  . Insulin dependent diabetes mellitus 11/17/2013  . Essential hypertension 11/17/2013   Past Medical History:  Diagnosis Date  . Adrenal adenoma, left 03/2017   noted on CT  . Allergic rhinitis, cause unspecified   . Anxiety state, unspecified   . Aortic atherosclerosis ([place]) 03/2017   noted on CT  . BPV (benign positional vertigo)   . 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 Coronary artery disease   . Depressive disorder, not elsewhere classified   . Dyspnea    history of  . ED (erectile dysfunction)   . Gallbladder polyp 03/2016   noted on CT  . Hepatic hemangioma 03/2017   noted on CT  . History of kidney stones    hx of years ago   . Indigestion   . 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 Mixed hyperlipidemia   . Prostate cancer ([place])   . 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 Type I (juvenile type) diabetes mellitus without mention of  complication, not stated as uncontrolled    type I - followed by Dr Buddy Duty   . Vesicoureteral reflux    Current Outpatient Medications  Medication Sig Dispense Refill  . aspirin 81 MG tablet Take 1 tablet (81 mg total) by mouth daily. 30 tablet   . cetirizine (ZYRTEC) 10 MG tablet Take 10 mg by mouth daily.    . chlorhexidine (PERIDEX) 0.12 % solution Use as directed 15 mLs in the mouth or throat daily.    . Coenzyme Q10 (COQ10) 100 MG CAPS Take 100 mg by mouth daily.    . Continuous Blood Gluc Sensor (FREESTYLE LIBRE 14 DAY SENSOR) MISC AS DIRECTED CHANGE SENSOR EVERY 14 DAYS 28    . ibuprofen (ADVIL) 200 MG tablet Take 400-800 mg by mouth every 6 (six) hours as needed for moderate pain. (Patient not taking: Reported on 06/17/2020)    . Insulin Human (INSULIN PUMP) SOLN Inject into the skin continuous. NOVOLOG 100 UNIT/ML VIAL    . lisinopril (PRINIVIL,ZESTRIL) 5 MG tablet Take 5 mg by mouth daily.     . Multiple Vitamins-Minerals (ICAPS AREDS 2) CAPS Take 1 capsule by mouth daily.    Marland Kitchen NOVOLOG 100 UNIT/ML injection Averages about 30 units per day via insulin pump    . pantoprazole (PROTONIX) 40 MG tablet Take 40 mg by mouth daily.    . rosuvastatin (CRESTOR) 5 MG tablet Take 1 tablet (5 mg total) by mouth daily. 90 tablet 3  .  sertraline (ZOLOFT) 100 MG tablet Take 50 mg by mouth daily.  0   No current facility-administered medications for this visit.   Allergies  Allergen Reactions  . Crestor [Rosuvastatin]     Muscle aches in higher dose  . Lipitor [Atorvastatin]     Muscle aches  . Other Other (See Comments)    Any antihistamines Causes shaking  . Zocor [Simvastatin] Other (See Comments)    Muscle aches      ASSESSMENT: Patient with anticipated radiation therapy to the head and neck for base of tongue CA.     PROCEDURE: Scatter protection devices were tried in.  Not adjustments needed. Polished. Post-op instructions were provided in a verbal format concerning the use and  care of SPDs.  Explained to patient that we will deliver his fluoride trays when he finishes radiation.   PLAN: . Patient to return to our clinic following the completion of radiation therapy for a dental appointment.  He will then return to his regular dentist for routine dental care including cleanings and periodic exams. . Patient instructed to call if questions or problems arise before he finishes radiation.  He verbalized understanding and is agreeable to the plan.   All questions and concerns were invited and addressed.  Patient tolerated today's visit well and departed in stable condition.   [place], DMD

## 2020-06-17 NOTE — Progress Notes (Signed)
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 Pt is accompanied today by his sister. The pt reports that he is doing well overall.  The pt reports that he was a long-term smoker and due to that he has been a participant in a lung cancer screening program. While doing routine monitoring small lung nodules were first noticed in 2017. They did not feel that action needed to be taken at that time so they simply continued monitoring him. A significant change was noticed within the last 6 months. Pt denies any fevers, chills, night sweats, SOB during this time. Pt had a Thoracoscopy with Middle Lobectomy with Dr. Koleen Nimrod on 05/08/2019. He currently feels well and denies any of the aforementioned symptoms except an increase in SOB, noticed after surgery.   Pt quit smoking about 10 years ago. He was diagnosed with COPD in 2018 and given an inhaler. He did not notice a difference with the inhaler so he has since stopped using it. Pt has not had any issues with vertigo in sometime and his diabetes has been well controlled. Pt has a history of Prostate Cancer and had it resected in January of 2019. Pt also had a cholecystectomy, which they decided on due to activity seen on 2017 PET scan. He denies any issues  resulting from his gallbladder removal. Diabetes and heart disease runs in his family. His father had prostate cancer and heart disease as well.   Pt will return to work in Spring. There is dust and floating debris at his job but he is not around it very often. Pt has stayed up to date with his annual flu vaccine as well as his pneumonia vaccines.   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 Masslike area of ground-glass straddles the minor fissure with septal thickening and fissural retraction. While the lesion has increased in size minimally from 09/30/2018, there is clear enlargement from baseline examination on 02/16/2016. Therefore, lesion is characterized as worrisome for adenocarcinoma, Lung-RADS 4B, suspicious. Additional imaging evaluation or consultation with Pulmonology or Thoracic Surgery recommended. These results will be called to the ordering clinician or representative by the Radiologist Assistant, and communication documented in the PACS or zVision Dashboard. 2. Liver hemangiomas and left adrenal adenoma, best characterized on 03/03/2016. 3. Aortic atherosclerosis (ICD10-170.0). Coronary artery calcification."  Pt has had PET/CT ([id number]) completed on 02/25/2016 with results revealing "Dominant 8 mm right lower lobe pulmonary nodule shows no significant FDG uptake. Other scattered less than 5 mm bilateral pulmonary nodules also show no FDG uptake but are too small to characterize by PET. Recommend followup by  chest CT in 6 months. (please use the following order, "CT CHEST LCS NODULE FOLLOW-UP W/O CM") Right middle lobe ground-glass  opacity shows low-grade FDG uptake, and differential diagnosis includes inflammatory or infectious processes and low-grade adenocarcinoma. Recommend followup by chest CT in 6 months.  Multiple low-attenuation liver masses show no hypermetabolic activity compared to normal hepatic parenchyma. These remain indeterminate and cannot be characterized without IV contrast. Abdomen MRI without and with contrast recommended for further evaluation. 2.5 cm left adrenal mass shows mild FDG uptake, and remains indeterminate. Differential diagnosis includes atypical adenoma, metastasis, pheochromocytoma, or less likely adrenal cortical carcinoma. This can also be further evaluated with abdomen MRI without and with contrast."  Most recent lab results (05/12/2019) of CBC is as follows: all values are WNL except for RBC at 3.62, Hgb at 11.2, HCT at 34.2.  On review of systems, pt reports increased SOB and denies testicular pain/swelling, leg swelling, fevers, chills, night sweats and any other symptoms.   On PMHx the pt reports COPD with emphysema, Benign Positional Vertigo, Diabetes, Prostate Cancer, Prostatectomy (2019), Cholecystectomy. On Social Hx the pt reports he is a former smoker who quit 10 years ago On Family Hx the pt reports that his father had Prostate Cancer and Heart Disease, additional family history of Heart Disease and Diabetes.  INTERVAL HISTORY:   Jose Harvey is a wonderful 60 y.o. male who is here for evaluation and management of newly diagnosed head and neck squamous cell carcinoma. [name] last visit with [place] was on 05/05/2020. The pt reports that he is doing well overall.  Based on his last CT soft tissue of the neck showing 1.9 x 1.6 cm enhancing right base of tongue mass and right level 2 lymphadenopathy he was referred to Dr. Radene Journey for ENT evaluation. He had a biopsy on 05/26/2020 which showed 05/26/20 Right Cervical LN Bx ([id number]) which revealed Squamous cell  carcinoma.  Patient subsequently had a PET CT scan on 06/09/2020 which confirmed Hypermetabolic lesion posterior right tongue with hypermetabolic level II cervical nodal metastases. No evidence for hypermetabolic metastases in the contralateral neck, chest, abdomen, or pelvis. 1.6 cm ground-glass opacity in the anterior left upper lobe identified on diagnostic CT chest of 05/03/2020 shows low level FDG uptake  Patient has seen Dr. Eppie Gibson and had a discussion with her regarding treatment options to consider chemoradiation versus possibility of TORS for primary resection of the tumor. He has not met [facility] to discuss the technicalities and feasibility of surgery personally at this time and is keen to explore this option further.  We discussed at length his diagnosis, prognosis, possible treatment options as per NCCN guidelines. He and his wife had multiple questions which were answered in details.  We discussed the potential adverse effects and limiting factors involved and considering high-dose cisplatin concurrent chemotherapy -including his longstanding type 1 diabetes with likely some element of diabetic nephropathy which would go hand-in-hand with his history of diabetic retinopathy. He also has significant intermittent issues with vertiginous dizziness. There are also multiple other medical comorbidities including his history of coronary artery disease and COPD that could increase his risk of infectious complications difficulty with tolerating high-dose chemotherapy.  We also discussed concerns with significant immune suppression from chemotherapy potentially serving adversely for his indolent MALT lymphoma which at this time has been stable.  MEDICAL HISTORY:  Past Medical History:  Diagnosis Date  . Adrenal adenoma, left 03/2017   noted on CT  . Allergic  rhinitis, cause unspecified   . Anxiety state, unspecified   . Aortic atherosclerosis ([place]) 03/2017   noted on CT  . BPV  (benign positional vertigo)   . COPD (chronic obstructive pulmonary disease) with emphysema (HCC)    mild followed by [facility]   . Coronary artery disease   . Depressive disorder, not elsewhere classified   . Dyspnea    history of  . ED (erectile dysfunction)   . Gallbladder polyp 03/2016   noted on CT  . Hepatic hemangioma 03/2017   noted on CT  . History of kidney stones    hx of years ago   . Indigestion   . 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 Mixed hyperlipidemia   . Prostate cancer ([place])   . 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 Type I (juvenile type) diabetes mellitus without mention of complication, not stated as uncontrolled    type I - followed by Dr Buddy Duty   . Vesicoureteral reflux     SURGICAL HISTORY: Past Surgical History:  Procedure Laterality Date  . CHOLECYSTECTOMY N/A 05/17/2016   Procedure: LAPAROSCOPIC CHOLECYSTECTOMY;  Surgeon: Clovis Riley, MD;  Location: [facility];  Service: General;  Laterality: N/A;  . cyst removed from throat     . dental surgeries     . LYMPHADENECTOMY Bilateral 07/02/2017   Procedure: BILATERAL LYMPHADENECTOMY;  Surgeon: Lucas Mallow, MD;  Location: [facility];  Service: Urology;  Laterality: Bilateral;  . PROSTATE BIOPSY    . ROBOT ASSISTED LAPAROSCOPIC RADICAL PROSTATECTOMY N/A 07/02/2017   Procedure: XI ROBOTIC ASSISTED LAPAROSCOPIC RADICAL PROSTATECTOMY;  Surgeon: Lucas Mallow, MD;  Location: [facility];  Service: Urology;  Laterality: N/A;  . TONSILLECTOMY    . VIDEO ASSISTED THORACOSCOPY (VATS)/ LOBECTOMY Right 05/08/2019   Procedure: Right VIDEO ASSISTED THORACOSCOPY with   MIDDLE LOBECTOMY and Enbloc portion of Upper Lobe with Node dissection, Intercostal Nerve Block;  Surgeon: Melrose Nakayama, MD;  Location: [facility];  Service: Thoracic;  Laterality: Right;  . WISDOM TOOTH EXTRACTION      SOCIAL HISTORY: Social History    Socioeconomic History  . Marital status: Divorced    Spouse name: Not on file  . Number of children: 2  . Years of education: Not on file  . 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 Smoking status: Former Smoker    Packs/day: 1.50    Years: 27.00    Pack years: 40.50    Types: Cigarettes    Quit date: 06/05/2008    Years since quitting: 12.0  . Smokeless tobacco: Former Systems developer    Types: Snuff    Quit date: 2015  Vaping Use  . Vaping Use: Never used  Substance and Sexual Activity  . Alcohol use: Yes    Alcohol/week: 6.0 standard drinks    Types: 6 Cans of beer per week    Comment: occasional 6pk per week  . Drug use: No  . Sexual activity: Not Currently  Other Topics Concern  . Not on file  Social History Narrative   Patient divorced. Patient has two grown daughters but neither lives locally.   Social Determinants of Health   Financial Resource Strain: Low Risk   . Difficulty of Paying Living Expenses: Not very hard  Food Insecurity: No Food Insecurity  . Worried About Charity fundraiser in the  Last Year: Never true  . Ran Out of Food in the Last Year: Never true  Transportation Needs: No Transportation Needs  . Lack of Transportation (Medical): No  . Lack of Transportation (Non-Medical): No  Physical Activity: Not on file  Stress: No Stress Concern Present  . Feeling of Stress : Only a little  Social Connections: Unknown  . Frequency of Communication with Friends and Family: More than three times a week  . Frequency of Social Gatherings with Friends and Family: More than three times a week  . Attends Religious Services: More than 4 times per year  . Active Member of Clubs or Organizations: Yes  . Attends Archivist Meetings: More than 4 times per year  . Marital Status: Not on file  Intimate Partner Violence: Not on file    FAMILY HISTORY: Family History  Problem Relation Age of Onset   . Diabetes type I Father   . Heart attack Father   . Hypertension Father   . Emphysema Father   . CAD Father   . Colon cancer Maternal Grandfather   . Emphysema Paternal Grandfather   . Diabetes type I Paternal Grandfather   . Kidney disease Neg Hx   . Liver disease Neg Hx   . Breast cancer Neg Hx   . Prostate cancer Neg Hx     ALLERGIES:  is allergic to crestor [rosuvastatin], lipitor [atorvastatin], other, and zocor [simvastatin].  MEDICATIONS:  Current Outpatient Medications  Medication Sig Dispense Refill  . aspirin 81 MG tablet Take 1 tablet (81 mg total) by mouth daily. 30 tablet   . cetirizine (ZYRTEC) 10 MG tablet Take 10 mg by mouth daily.    . chlorhexidine (PERIDEX) 0.12 % solution Use as directed 15 mLs in the mouth or throat daily.    . Coenzyme Q10 (COQ10) 100 MG CAPS Take 100 mg by mouth daily.    . Continuous Blood Gluc Sensor (FREESTYLE LIBRE 14 DAY SENSOR) MISC AS DIRECTED CHANGE SENSOR EVERY 14 DAYS 28    . ibuprofen (ADVIL) 200 MG tablet Take 400-800 mg by mouth every 6 (six) hours as needed for moderate pain.    . Insulin Human (INSULIN PUMP) SOLN Inject into the skin continuous. NOVOLOG 100 UNIT/ML VIAL    . lisinopril (PRINIVIL,ZESTRIL) 5 MG tablet Take 5 mg by mouth daily.     . Multiple Vitamins-Minerals (ICAPS AREDS 2) CAPS Take 1 capsule by mouth daily.    Marland Kitchen NOVOLOG 100 UNIT/ML injection Averages about 30 units per day via insulin pump    . pantoprazole (PROTONIX) 40 MG tablet Take 40 mg by mouth daily.    . rosuvastatin (CRESTOR) 5 MG tablet Take 1 tablet (5 mg total) by mouth daily. 90 tablet 3  . sertraline (ZOLOFT) 100 MG tablet Take 50 mg by mouth daily.  0   No current facility-administered medications for this visit.    REVIEW OF SYSTEMS:   A 10+ POINT REVIEW OF SYSTEMS WAS OBTAINED including neurology, dermatology, psychiatry, cardiac, respiratory, lymph, extremities, GI, GU, Musculoskeletal, constitutional, breasts, reproductive, HEENT.  All  pertinent positives are noted in the HPI.  All others are negative.   PHYSICAL EXAMINATION: ECOG PERFORMANCE STATUS: 0 - Asymptomatic  . Vitals:   06/17/20 0910  BP: 125/64  Pulse: 71  Resp: 20  Temp: 97.6 F (36.4 C)  SpO2: 99%   Filed Weights   06/17/20 0910  Weight: 182 lb 4.8 oz (82.7 kg)   .Body mass index is 25.43  kg/m.  NAD GENERAL:alert, in no acute distress and comfortable SKIN: no acute rashes, no significant lesions EYES: conjunctiva are pink and non-injected, sclera anicteric OROPHARYNX: MMM, no exudates, no oropharyngeal erythema or ulceration NECK: supple, no JVD LYMPH:  no palpable lymphadenopathy in the cervical, axillary or inguinal regions LUNGS: clear to auscultation b/l with normal respiratory effort HEART: regular rate & rhythm ABDOMEN:  normoactive bowel sounds , non tender, not distended. No palpable hepatosplenomegaly.  Extremity: no pedal edema PSYCH: alert & oriented x 3 with fluent speech NEURO: no focal motor/sensory deficits  LABORATORY DATA:  I have reviewed the data as listed  . CBC Latest Ref Rng & Units 05/26/2020 05/05/2020 02/19/2020  WBC 4.0 - 10.5 K/uL 7.4 11.8(H) 10.2  Hemoglobin 13.0 - 17.0 g/dL 14.2 14.6 15.0  Hematocrit 39.0 - 52.0 % 44.2 44.3 45.2  Platelets 150 - 400 K/uL 259 243 252    CMP Latest Ref Rng & Units 06/17/2020 05/05/2020 05/03/2020  Glucose 70 - 99 mg/dL - 156(H) -  BUN 8 - 23 mg/dL 24(H) 18 -  Creatinine 0.61 - 1.24 mg/dL 1.08 1.07 1.00  Sodium 135 - 145 mmol/L - 136 -  Potassium 3.5 - 5.1 mmol/L - 3.9 -  Chloride 98 - 111 mmol/L - 104 -  CO2 22 - 32 mmol/L - 25 -  Calcium 8.9 - 10.3 mg/dL - 9.5 -  Total Protein 6.5 - 8.1 g/dL - 7.6 -  Total Bilirubin 0.3 - 1.2 mg/dL - 0.4 -  Alkaline Phos 38 - 126 U/L - 101 -  AST 15 - 41 U/L - 18 -  ALT 0 - 44 U/L - 21 -   05/26/2020 Right Cervical LN Bx ([id number]):   05/08/2019 Surgical Pathology [id number]):    05/08/2019 Surgical Pathology  [phone]):    RADIOGRAPHIC STUDIES: I have personally reviewed the radiological images as listed and agreed with the findings in the report. NM PET Image Initial (PI) Skull Base To Thigh  Result Date: 06/09/2020 CLINICAL DATA:  Initial treatment strategy for head neck cancer. Squamous cell carcinoma at base of tongue. EXAM: NUCLEAR MEDICINE PET SKULL BASE TO THIGH TECHNIQUE: 8.9 mCi F-18 FDG was injected intravenously. Full-ring PET imaging was performed from the skull base to thigh after the radiotracer. CT data was obtained and used for attenuation correction and anatomic localization. Fasting blood glucose: 141 mg/dl COMPARISON:  CT neck chest abdomen pelvis 05/03/2020. PET-CT 02/25/2016. FINDINGS: Mediastinal blood pool activity: SUV max 2.5 Liver activity: SUV max NA NECK: Focal hypermetabolism in the posterior right lung demonstrates SUV max = 9.9. Hypermetabolic right level II cervical lymph nodes are hypermetabolic with SUV max = 7.7. 10 mm short axis lymph node just posterior to the right hyoid bone demonstrates SUV max = 8.3. No evidence for hypermetabolic cervical lymphadenopathy in the contralateral neck. No hypermetabolic supraclavicular metastases. Incidental CT findings: none CHEST: No hypermetabolic mediastinal or hilar nodes. No suspicious hypermetabolic pulmonary nodules on the CT scan to suggest metastatic disease. Ground-glass opacity seen in the anterior left upper lobe on previous CT imaging shows only low level FDG accumulation with SUV max = 1.4. Incidental CT findings: Atherosclerotic calcification noted abdominal aorta. Postsurgical change noted right lung and hilum. ABDOMEN/PELVIS: No abnormal hypermetabolic activity within the liver, pancreas, right adrenal gland, or spleen. Low level FDG uptake noted in the left adrenal gland, demonstrating nodular thickening with SUV max = 2.7. No hypermetabolic lymph nodes in the abdomen or pelvis. Nonspecific FDG accumulation noted in the  region of the ileocecal valve. Incidental CT findings: Multiple hypoattenuating liver lesions are stable comparing back to 02/25/2016 consistent with benign etiology and previously characterized as hemangiomas. Atherosclerotic calcification noted abdominal aorta. Small right groin hernia contains only fat. SKELETON: No focal hypermetabolic activity to suggest skeletal metastasis. Incidental CT findings: Sclerotic lesion left humeral neck is not hypermetabolic. IMPRESSION: 1. Hypermetabolic lesion posterior right tongue with hypermetabolic level II cervical nodal metastases. No evidence for hypermetabolic metastases in the contralateral neck, chest, abdomen, or pelvis. 2. 1.6 cm ground-glass opacity in the anterior left upper lobe identified on diagnostic CT chest of 05/03/2020 shows low level FDG uptake. As well differentiated or low-grade neoplasm can be poorly FDG avid, continued surveillance is recommended. 3. Low level FDG accumulation in the left adrenal nodule, stable comparing back to at least 09/12/2016. Continued attention on follow-up recommended. 4.  Aortic Atherosclerois (ICD10-170.0) Electronically Signed   By: Misty Stanley M.D.   On: 06/09/2020 08:55   [place] CORE BIOPSY (LYMPH NODES)  Result Date: 05/26/2020 INDICATION: History of lymphoma now with right cervical lymphadenopathy. Please perform ultrasound-guided biopsy for tissue diagnostic purposes. EXAM: ULTRASOUND-GUIDED RIGHT CERVICAL LYMPH NODE BIOPSY COMPARISON:  Neck CT-05/03/2020 MEDICATIONS: None ANESTHESIA/SEDATION: None, per patient request COMPLICATIONS: None immediate. TECHNIQUE: Informed written consent was obtained from the patient after a discussion of the risks, benefits and alternatives to treatment. Questions regarding the procedure were encouraged and answered. Initial ultrasound scanning demonstrated pathologically enlarged right cervical lymph node measuring approximately 3.1 x 1.7 cm (image 4), correlating with the dominant  cervical lymph node seen on preceding neck CT image 48, series 3. An ultrasound image was saved for documentation purposes. The procedure was planned. A timeout was performed prior to the initiation of the procedure. The operative was prepped and draped in the usual sterile fashion, and a sterile drape was applied covering the operative field. A timeout was performed prior to the initiation of the procedure. Local anesthesia was provided with 1% lidocaine with epinephrine. Under direct ultrasound guidance, an 18 gauge core needle device was utilized to obtain to obtain 6 core needle biopsies of the dominant right cervical lymph node. The samples were placed in saline and submitted to pathology. The needle was removed and hemostasis was achieved with manual compression. Post procedure scan was negative for significant hematoma. A dressing was placed. The patient tolerated the procedure well without immediate postprocedural complication. IMPRESSION: Technically successful ultrasound guided biopsy of dominant right cervical lymph node. Electronically Signed   By: Sandi Mariscal M.D.   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 Plan -I discussed with the patient his pathology results in details showing new diagnosis of squamous cell carcinoma of the base of the tongue metastatic to right cervical lymph nodes. -We discussed that tumor is p16 positive suggests its at least partly mediated by HPV.  This by itself does suggest a better prognosis however increasingly there is data showing a smoking history of more than 30 pack years does adversely affect this  favorable prognosis. -We discussed NCCN guidelines and treatment options.  He has non bulky right level 2  lymphadenopathy.  Treatment options presented were consideration of TORS surgery with lymph node dissection if deemed technically feasible by ENT.  He understands that there might be a role for adjuvant radiation or chemoradiation if he does have high risk features on pathology after surgery. -Alternative option if surgery is not technically feasible would be to consider concurrent chemotherapy and radiation.  Patient would not be the best candidate for high-dose concurrent cisplatin given his diabetic nephropathy, significant pulmonary and cardiac comorbidities and an eye to avoid immune suppression in the setting of known extranodal marginal zone lymphoma.  If this option is to be exercised might need to go with carboplatin/Taxol weekly. -Patient has not had a detailed discussion with [facility] to discuss the technicalities and feasibility and pros and cons of primary surgical resection of his tumor and would like to pursue this. -He has been referred to see Dr. Silvio Clayman at [facility] to discuss TORS. -All the patient's and his wife's concerns were addressed in details.    -Counseled extensively on continued smoking cessation and avoiding alcohol use.    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 Masslike area of ground-glass straddles the minor fissure with septal thickening and fissural retraction. While the lesion has  increased in size minimally from 09/30/2018, there is clear enlargement from baseline examination on 02/16/2016. Therefore, lesion is characterized as worrisome for adenocarcinoma, Lung-RADS 4B, suspicious. -02/25/2016 PET/CT ([id number]) revealed "Dominant 8 mm right lower lobe pulmonary nodule shows no significant FDG uptake. Other scattered less than 5 mm bilateral pulmonary nodules also show no FDG uptake but are too small to characterize by PET. Right middle lobe ground-glass opacity shows low-grade FDG uptake, and differential diagnosis includes inflammatory or infectious processes and low-grade adenocarcinoma. Multiple low-attenuation liver masses show no hypermetabolic activity compared to normal hepatic parenchyma. These remain indeterminate and cannot be characterized without IV contrast. Abdomen MRI without and with contrast recommended for further evaluation. 2.5 cm left adrenal mass shows mild FDG uptake, and remains indeterminate. Differential diagnosis includes atypical adenoma, metastasis, pheochromocytoma, or less likely adrenal cortical carcinoma."  PLAN: -Recent PET CT scan on 06/09/2020 shows Ground-glass opacity seen in the anterior left upper lobe on previous CT imaging shows only low level FDG accumulation with SUV max = 1.4.  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 -We shall monitor this lesion and treat if symptomatic or significant increase in size.  Currently primary priority is treating his newly diagnosed head and neck cancer.   FOLLOW UP: Medical oncology follow-up based on decision about surgery.   The total time spent in the appt was 60 minutes and more than 50% was on counseling and direct patient cares and coordination of care with radiation oncology.  All of the patient's questions were answered with apparent satisfaction. The patient knows to call the clinic with any problems, questions or  concerns.   Sullivan Lone MD [place] AAHIVMS [facility] Hematology/Oncology Physician [facility]  (Office):       [phone] (Work cell):  [phone] (Fax):  [phone]  06/17/2020 2:03 AM  I, Yevette Edwards, am acting as a scribe for Dr. Sullivan Lone.   .I have reviewed the above documentation for accuracy and completeness, and I agree with the above. Brunetta Genera MD

## 2020-06-17 NOTE — Progress Notes (Signed)
Oncology Nurse Navigator Documentation  Met with patient during initial consult with Dr. Irene Limbo. He was accompanied by his sister, Becky Sax . Further introduced myself as his/their Navigator, explained my role as a member of the Care Team. . Assisted with post-consult appt scheduling.  . They verbalized understanding of information provided. . I encouraged them to call with questions/concerns moving forward.  I will meet with him during his CT simulation tomorrow.   Harlow Asa, RN, BSN, OCN Head & Neck Oncology Nurse [place] at [place] [phone]

## 2020-06-18 ENCOUNTER — Other Ambulatory Visit: Payer: Self-pay

## 2020-06-18 ENCOUNTER — Ambulatory Visit
Admission: RE | Admit: 2020-06-18 | Discharge: 2020-06-18 | Disposition: A | Discharge status: Home / Self Care | Payer: BC Managed Care – PPO | Source: Ambulatory Visit | Attending: Radiation Oncology | Admitting: Radiation Oncology

## 2020-06-18 ENCOUNTER — Encounter: Payer: Self-pay | Admitting: Radiation Oncology

## 2020-06-18 VITALS — BP 116/69 | HR 76 | Temp 97.5°F | Resp 17

## 2020-06-18 DIAGNOSIS — C884 Extranodal marginal zone B-cell lymphoma of mucosa-associated lymphoid tissue [MALT-lymphoma]: Secondary | ICD-10-CM | POA: Diagnosis not present

## 2020-06-18 DIAGNOSIS — C01 Malignant neoplasm of base of tongue: Secondary | ICD-10-CM

## 2020-06-18 DIAGNOSIS — Z87891 Personal history of nicotine dependence: Secondary | ICD-10-CM | POA: Diagnosis not present

## 2020-06-18 DIAGNOSIS — C770 Secondary and unspecified malignant neoplasm of lymph nodes of head, face and neck: Secondary | ICD-10-CM | POA: Diagnosis not present

## 2020-06-18 MED ORDER — SODIUM CHLORIDE 0.9% FLUSH
10.0000 mL | Freq: Once | INTRAVENOUS | Status: AC
  Administered 2020-06-18: 10 mL via INTRAVENOUS

## 2020-06-18 NOTE — Progress Notes (Signed)
Has armband been applied?  Yes.    Does patient have an allergy to IV contrast dye?: No.   Has patient ever received premedication for IV contrast dye?: No.   Does patient take metformin?: No.  Date of lab work: June 18, 2020 BUN: 24 CR: 1.08  IV site: antecubital right, condition patent and no redness  Has IV site been added to flowsheet?  Yes.    Vitals:   06/18/20 0740  BP: 116/69  Pulse: 76  Resp: 17  Temp: (!) 97.5 F (36.4 C)  SpO2: 99%

## 2020-06-18 NOTE — Progress Notes (Signed)
Oncology Nurse Navigator Documentation  To provide support, encouragement and care continuity, met with Jose Harvey during his CT SIM. He was accompanied by his sister, Jose Harvey He tolerated procedure without difficulty, denied questions/concerns.   I toured him to [facility] 3 treatment area, explained procedures for lobby registration, arrival to Radiation Waiting, and arrival to treatment area.  He voiced understanding.   I encouraged him to call me prior to 06/28/20 [place]. He knows that I will contact him regarding final decision about treatment after Dr. Irene Limbo and Dr. Isidore Moos have discussed recommendations.   Harlow Asa RN, BSN, OCN Head & Neck Oncology Nurse [place] at [facility] Phone # [phone]  Fax # [phone]

## 2020-06-18 NOTE — Progress Notes (Signed)
I spoke with Dr. Irene Limbo about this patient, and Dr. Irene Limbo expressed his concerns about Mr Heatherly tolerating standard chemotherapy due to his medical history/ co morbidities.  Given these concerns, we will refer the patient to [facility] for TORS consideration (based on my discussion w/ Dr Nicolette Bang  TORS is feasible although patient may have pathologic risk factors post-op that ultimately warrant consideration of RT and Chemotherapy.)  It's possible we will elect to perform RT only, postoperatively, given the concerns re: chemotherapy tolerance by Irene Limbo.  To be determined.  -----------------------------------  Eppie Gibson, MD

## 2020-06-21 ENCOUNTER — Inpatient Hospital Stay: Payer: BC Managed Care – PPO | Admitting: Nutrition

## 2020-06-24 ENCOUNTER — Ambulatory Visit: Payer: BC Managed Care – PPO | Admitting: Physical Therapy

## 2020-06-25 DIAGNOSIS — C01 Malignant neoplasm of base of tongue: Secondary | ICD-10-CM | POA: Diagnosis not present

## 2020-06-28 ENCOUNTER — Ambulatory Visit: Payer: BC Managed Care – PPO | Admitting: Radiation Oncology

## 2020-06-29 ENCOUNTER — Inpatient Hospital Stay: Payer: BC Managed Care – PPO

## 2020-06-29 ENCOUNTER — Other Ambulatory Visit: Payer: Self-pay

## 2020-06-29 ENCOUNTER — Other Ambulatory Visit: Payer: Self-pay | Admitting: Hematology

## 2020-06-29 ENCOUNTER — Ambulatory Visit: Payer: BC Managed Care – PPO

## 2020-06-29 DIAGNOSIS — C01 Malignant neoplasm of base of tongue: Secondary | ICD-10-CM | POA: Diagnosis not present

## 2020-06-29 DIAGNOSIS — C884 Extranodal marginal zone B-cell lymphoma of mucosa-associated lymphoid tissue [MALT-lymphoma]: Secondary | ICD-10-CM | POA: Diagnosis not present

## 2020-06-29 DIAGNOSIS — Z7189 Other specified counseling: Secondary | ICD-10-CM | POA: Insufficient documentation

## 2020-06-29 DIAGNOSIS — Z87891 Personal history of nicotine dependence: Secondary | ICD-10-CM | POA: Diagnosis not present

## 2020-06-29 DIAGNOSIS — C770 Secondary and unspecified malignant neoplasm of lymph nodes of head, face and neck: Secondary | ICD-10-CM | POA: Diagnosis not present

## 2020-06-29 MED ORDER — PROCHLORPERAZINE MALEATE 10 MG PO TABS: 10.0000 mg | ORAL_TABLET | Freq: Four times a day (QID) | ORAL | 1 refills | Status: DC | PRN

## 2020-06-29 MED ORDER — LIDOCAINE-PRILOCAINE 2.5-2.5 % EX CREA: TOPICAL_CREAM | CUTANEOUS | 3 refills | Status: DC

## 2020-06-29 MED ORDER — LORAZEPAM 0.5 MG PO TABS: 0.5000 mg | ORAL_TABLET | Freq: Four times a day (QID) | ORAL | 0 refills | Status: DC | PRN

## 2020-06-29 MED ORDER — ONDANSETRON HCL 8 MG PO TABS: 8.0000 mg | ORAL_TABLET | Freq: Two times a day (BID) | ORAL | 1 refills | Status: DC | PRN

## 2020-06-29 MED ORDER — DEXAMETHASONE 4 MG PO TABS: 8.0000 mg | ORAL_TABLET | Freq: Every day | ORAL | 1 refills | Status: DC

## 2020-06-29 NOTE — Progress Notes (Signed)
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  Past medical history of tobacco use, COPD, prostate cancer, DM type 1 on insulin pump, HLD, GERD, depression, CAD, anxiety.     Met with patient in clinic this pm.  Patient reports that he met with [facility] surgeon and not planning surgery at this time.  Planning to proceed with chemotherapy and radiation therapy.  Anticipating port and PEG placement.  Patient reports that his appetite is good, normal.  Able to tolerate all consistencies of foods.  Lives alone. Cooks for himself or gets something out at [employer].  Breakfast is usually biscuit (bacon, egg, cheese) and coffee.  Lunch yesterday was chicken sandwich. Supper last night was baked spaghetti.     Medications: insulin pump. MVI, protonix, crestor, zofloft   Labs: reviewed   Anthropometrics:   Height: 71 inches Weight: 182 lb 1/13 UBW: 180s, denies weight loss BMI: 25   Estimated Energy Needs  Kcals: 3086-5784 Protein: 125-145 g Fluid: > 2.5 L   NUTRITION DIAGNOSIS: Predicted sub optimal energy intake related to anticipated cancer related treatment side effects as evidenced by planning feeding tube placement   INTERVENTION:  Discussed importance of nutrition during treatment and after as well as RD role.  Answered questions regarding feeding tube.  Discussed soft, moist protein foods. Handout provided Discussed ways to add calories and protein. Handout provided Contact information provided   MONITORING, EVALUATION, GOAL: weight trends, intake   Next Visit: Feb 2 (Wednesday) with Jerrell Belfast B. Zenia Resides, [place], [place] Registered Dietitian [phone] (mobile)

## 2020-06-29 NOTE — Progress Notes (Signed)
START OFF PATHWAY REGIMEN - Head and Neck   [id number]:Carboplatin + Paclitaxel (2/50) + RT weekly x 6 weeks:   Administer weekly during RT:     Paclitaxel      Carboplatin   **Always confirm dose/schedule in your pharmacy ordering system**  Patient Characteristics: Oropharynx, HPV Positive, Preoperative or Nonsurgical Candidate (Clinical Staging), cT0-4, cN1-3 or cT3-4, cN0 Disease Classification: Oropharynx HPV Status: Positive (+) Therapeutic Status: Preoperative or Nonsurgical Candidate (Clinical Staging) AJCC T Category: cT2 AJCC 8 Stage Grouping: I AJCC N Category: cN1 AJCC M Category: cM0 Intent of Therapy: Curative Intent, Discussed with Patient

## 2020-06-30 ENCOUNTER — Ambulatory Visit: Payer: BC Managed Care – PPO

## 2020-07-01 ENCOUNTER — Ambulatory Visit: Payer: BC Managed Care – PPO

## 2020-07-02 ENCOUNTER — Ambulatory Visit: Payer: BC Managed Care – PPO

## 2020-07-02 ENCOUNTER — Telehealth: Payer: Self-pay | Admitting: Hematology

## 2020-07-02 NOTE — Telephone Encounter (Signed)
Called to confirm appts for next wk 1/31 and 2/1 - pt confirmed and he is aware of appts.

## 2020-07-04 NOTE — Progress Notes (Signed)
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 male who has been referred to [place] by Dr Dorris Fetch for evaluation and management of MALT lymphoma. Pt is accompanied today by his sister. The pt reports that he is doing well overall.  The pt reports that he was a long-term smoker and due to that he has been a participant in a lung cancer screening program. While doing routine monitoring small lung nodules were first noticed in 2017. They did not feel that action needed to be taken at that time so they simply continued monitoring him. A significant change was noticed within the last 6 months. Pt denies any fevers, chills, night sweats, SOB during this time. Pt had a Thoracoscopy with Middle Lobectomy with Dr. Orson Aloe on 05/08/2019. He currently feels well and denies any of the aforementioned symptoms except an increase in SOB, noticed after surgery.   Pt quit smoking about 10 years ago. He was diagnosed with COPD in 2018 and given an inhaler. He did not notice a difference with the inhaler so he has since stopped using it. Pt has not had any issues with vertigo in sometime and his diabetes has been well controlled. Pt has a history of Prostate Cancer and had it resected in January of 2019. Pt also had a cholecystectomy, which they decided on due to activity seen on 2017 PET scan. He denies any issues  resulting from his gallbladder removal. Diabetes and heart disease runs in his family. His father had prostate cancer and heart disease as well.   Pt will return to work in Spring. There is dust and floating debris at his job but he is not around it very often. Pt has stayed up to date with his annual flu vaccine as well as his pneumonia vaccines.   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 Masslike area of ground-glass straddles the minor fissure with septal thickening and fissural retraction. While the lesion has increased in size minimally from 09/30/2018, there is clear enlargement from baseline examination on 02/16/2016. Therefore, lesion is characterized as worrisome for adenocarcinoma, Lung-RADS 4B, suspicious. Additional imaging evaluation or consultation with Pulmonology or Thoracic Surgery recommended. These results will be called to the ordering clinician or representative by the Radiologist Assistant, and communication documented in the PACS or zVision Dashboard. 2. Liver hemangiomas and left adrenal adenoma, best characterized on 03/03/2016. 3. Aortic atherosclerosis (ICD10-170.0). Coronary artery calcification."  Pt has had PET/CT ([id number]) completed on 02/25/2016 with results revealing "Dominant 8 mm right lower lobe pulmonary nodule shows no significant FDG uptake. Other scattered less than 5 mm bilateral pulmonary nodules also show no FDG uptake but are too small to characterize by PET. Recommend followup by  chest CT in 6 months. (please use the following order, "CT CHEST LCS NODULE FOLLOW-UP W/O CM") Right middle lobe ground-glass  opacity shows low-grade FDG uptake, and differential diagnosis includes inflammatory or infectious processes and low-grade adenocarcinoma. Recommend followup by chest CT in 6 months.  Multiple low-attenuation liver masses show no hypermetabolic activity compared to normal hepatic parenchyma. These remain indeterminate and cannot be characterized without IV contrast. Abdomen MRI without and with contrast recommended for further evaluation. 2.5 cm left adrenal mass shows mild FDG uptake, and remains indeterminate. Differential diagnosis includes atypical adenoma, metastasis, pheochromocytoma, or less likely adrenal cortical carcinoma. This can also be further evaluated with abdomen MRI without and with contrast."  Most recent lab results (05/12/2019) of CBC is as follows: all values are WNL except for RBC at 3.62, Hgb at 11.2, HCT at 34.2.  On review of systems, pt reports increased SOB and denies testicular pain/swelling, leg swelling, fevers, chills, night sweats and any other symptoms.   On PMHx the pt reports COPD with emphysema, Benign Positional Vertigo, Diabetes, Prostate Cancer, Prostatectomy (2019), Cholecystectomy. On Social Hx the pt reports he is a former smoker who quit 10 years ago On Family Hx the pt reports that his father had Prostate Cancer and Heart Disease, additional family history of Heart Disease and Diabetes.  INTERVAL HISTORY:   Jose Harvey is a wonderful 60 y.o. male who is here for evaluation and management of newly diagnosed head and neck squamous cell carcinoma. [name] last visit with [place] was on 06/17/2020. The pt reports that he is doing well overall. The pt is starting his first treatment of Carboplatin/Taxol tomorrow, 07/06/2020. We are joined today by his wife. Discussed considerations of carbo/taxol chemotherapy concurrently with RT. The pt has received both his COVID vaccines and booster.  The pt's gastronomy tube and port insertion are scheduled for  07/07/2020.  The pt is scheduled to start radiation today, 07/05/2020.  The pt reports no new symptoms or concerns. He had an ENT appointment with Dr. Erroll Luna on 07/01/2020.   Lab results today 07/05/2020 of CBC w/diff and CMP is as follows: In progress. 07/05/2020 LDH in progress. 07/05/2020 Prostate-Specific AG in progress.   On review of systems, pt denies new lumps/bumps, swallowing issues, abdominal pain, back pain and any other symptoms.  MEDICAL HISTORY:  Past Medical History:  Diagnosis Date  . Adrenal adenoma, left 03/2017   noted on CT  . Allergic rhinitis, cause unspecified   . Anxiety state, unspecified   . Aortic atherosclerosis (HCC) 03/2017   noted on CT  . BPV (benign positional vertigo)   . COPD (chronic obstructive pulmonary disease) with emphysema (HCC)    mild followed by [facility]   . Coronary artery disease   . Depressive disorder, not elsewhere classified   . Dyspnea    history of  . ED (erectile dysfunction)   . Gallbladder polyp 03/2016   noted on CT  . Hepatic hemangioma 03/2017   noted on CT  . History of kidney stones    hx of years ago   . Indigestion   . 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 Mixed hyperlipidemia   . Prostate cancer (HCC)   . 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 Type I (juvenile type) diabetes mellitus without mention of complication, not stated as uncontrolled    type I - followed by Dr Sharl Ma   . Vesicoureteral reflux  SURGICAL HISTORY: Past Surgical History:  Procedure Laterality Date  . CHOLECYSTECTOMY N/A 05/17/2016   Procedure: LAPAROSCOPIC CHOLECYSTECTOMY;  Surgeon: Clovis Riley, MD;  Location: [facility];  Service: General;  Laterality: N/A;  . cyst removed from throat     . dental surgeries     . LYMPHADENECTOMY Bilateral 07/02/2017   Procedure: BILATERAL LYMPHADENECTOMY;  Surgeon: Lucas Mallow, MD;   Location: [facility];  Service: Urology;  Laterality: Bilateral;  . PROSTATE BIOPSY    . ROBOT ASSISTED LAPAROSCOPIC RADICAL PROSTATECTOMY N/A 07/02/2017   Procedure: XI ROBOTIC ASSISTED LAPAROSCOPIC RADICAL PROSTATECTOMY;  Surgeon: Lucas Mallow, MD;  Location: [facility];  Service: Urology;  Laterality: N/A;  . TONSILLECTOMY    . VIDEO ASSISTED THORACOSCOPY (VATS)/ LOBECTOMY Right 05/08/2019   Procedure: Right VIDEO ASSISTED THORACOSCOPY with   MIDDLE LOBECTOMY and Enbloc portion of Upper Lobe with Node dissection, Intercostal Nerve Block;  Surgeon: Melrose Nakayama, MD;  Location: [facility];  Service: Thoracic;  Laterality: Right;  . WISDOM TOOTH EXTRACTION      SOCIAL HISTORY: Social History   Socioeconomic History  . Marital status: Divorced    Spouse name: Not on file  . Number of children: 2  . Years of education: Not on file  . 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 Smoking status: Former Smoker    Packs/day: 1.50    Years: 27.00    Pack years: 40.50    Types: Cigarettes    Quit date: 06/05/2008    Years since quitting: 12.0  . Smokeless tobacco: Former Systems developer    Types: Snuff    Quit date: 2015  Vaping Use  . Vaping Use: Never used  Substance and Sexual Activity  . Alcohol use: Yes    Alcohol/week: 6.0 standard drinks    Types: 6 Cans of beer per week    Comment: occasional 6pk per week  . Drug use: No  . Sexual activity: Not Currently  Other Topics Concern  . Not on file  Social History Narrative   Patient divorced. Patient has two grown daughters but neither lives locally.   Social Determinants of Health   Financial Resource Strain: Low Risk   . Difficulty of Paying Living Expenses: Not very hard  Food Insecurity: No Food Insecurity  . Worried About Charity fundraiser in the Last Year: Never true  . Ran Out of Food in the Last Year: Never true  Transportation Needs: No Transportation Needs  .  Lack of Transportation (Medical): No  . Lack of Transportation (Non-Medical): No  Physical Activity: Not on file  Stress: No Stress Concern Present  . Feeling of Stress : Only a little  Social Connections: Unknown  . Frequency of Communication with Friends and Family: More than three times a week  . Frequency of Social Gatherings with Friends and Family: More than three times a week  . Attends Religious Services: More than 4 times per year  . Active Member of Clubs or Organizations: Yes  . Attends Archivist Meetings: More than 4 times per year  . Marital Status: Not on file  Intimate Partner Violence: Not on file    FAMILY HISTORY: Family History  Problem Relation Age of Onset  . Diabetes type I Father   . Heart attack Father   . Hypertension Father   . Emphysema Father   . CAD Father   .  Colon cancer Maternal Grandfather   . Emphysema Paternal Grandfather   . Diabetes type I Paternal Grandfather   . Kidney disease Neg Hx   . Liver disease Neg Hx   . Breast cancer Neg Hx   . Prostate cancer Neg Hx     ALLERGIES:  is allergic to crestor [rosuvastatin], lipitor [atorvastatin], other, and zocor [simvastatin].  MEDICATIONS:  Current Outpatient Medications  Medication Sig Dispense Refill  . aspirin 81 MG tablet Take 1 tablet (81 mg total) by mouth daily. 30 tablet   . cetirizine (ZYRTEC) 10 MG tablet Take 10 mg by mouth daily.    . chlorhexidine (PERIDEX) 0.12 % solution Use as directed 15 mLs in the mouth or throat daily.    . Coenzyme Q10 (COQ10) 100 MG CAPS Take 100 mg by mouth daily.    . Continuous Blood Gluc Sensor (FREESTYLE LIBRE 14 DAY SENSOR) MISC AS DIRECTED CHANGE SENSOR EVERY 14 DAYS 28    . dexamethasone (DECADRON) 4 MG tablet Take 2 tablets (8 mg total) by mouth daily. Start the day after chemotherapy for 2 days. 30 tablet 1  . ibuprofen (ADVIL) 200 MG tablet Take 400-800 mg by mouth every 6 (six) hours as needed for moderate pain. (Patient not taking:  Reported on 06/17/2020)    . Insulin Human (INSULIN PUMP) SOLN Inject into the skin continuous. NOVOLOG 100 UNIT/ML VIAL    . lidocaine-prilocaine (EMLA) cream Apply to affected area once 30 g 3  . lisinopril (PRINIVIL,ZESTRIL) 5 MG tablet Take 5 mg by mouth daily.     Marland Kitchen LORazepam (ATIVAN) 0.5 MG tablet Take 1 tablet (0.5 mg total) by mouth every 6 (six) hours as needed (Nausea or vomiting). 30 tablet 0  . Multiple Vitamins-Minerals (ICAPS AREDS 2) CAPS Take 1 capsule by mouth daily.    Marland Kitchen NOVOLOG 100 UNIT/ML injection Averages about 30 units per day via insulin pump    . ondansetron (ZOFRAN) 8 MG tablet Take 1 tablet (8 mg total) by mouth 2 (two) times daily as needed for refractory nausea / vomiting. Start on day 3 after chemo. 30 tablet 1  . pantoprazole (PROTONIX) 40 MG tablet Take 40 mg by mouth daily.    . prochlorperazine (COMPAZINE) 10 MG tablet Take 1 tablet (10 mg total) by mouth every 6 (six) hours as needed (Nausea or vomiting). 30 tablet 1  . rosuvastatin (CRESTOR) 5 MG tablet Take 1 tablet (5 mg total) by mouth daily. 90 tablet 3  . sertraline (ZOLOFT) 100 MG tablet Take 50 mg by mouth daily.  0   No current facility-administered medications for this visit.    REVIEW OF SYSTEMS:   10 Point review of Systems was done is negative except as noted above.   PHYSICAL EXAMINATION: ECOG PERFORMANCE STATUS: 0 - Asymptomatic  . Vitals:   07/05/20 1514  BP: 137/71  Pulse: 85  Resp: 20  Temp: 98.1 F (36.7 C)  SpO2: 99%   Filed Weights   07/05/20 1514  Weight: 181 lb (82.1 kg)   .Body mass index is 25.24 kg/m.   GENERAL:alert, in no acute distress and comfortable SKIN: no acute rashes, no significant lesions EYES: conjunctiva are pink and non-injected, sclera anicteric OROPHARYNX: MMM, no exudates, no oropharyngeal erythema or ulceration NECK: supple, no JVD LYMPH:  no palpable lymphadenopathy in the cervical, axillary or inguinal regions LUNGS: clear to auscultation b/l  with normal respiratory effort HEART: regular rate & rhythm ABDOMEN:  normoactive bowel sounds , non tender, not  distended. Extremity: no pedal edema PSYCH: alert & oriented x 3 with fluent speech NEURO: no focal motor/sensory deficits  LABORATORY DATA:  I have reviewed the data as listed  . CBC Latest Ref Rng & Units 07/07/2020 07/05/2020 05/26/2020  WBC 4.0 - 10.5 K/uL 10.1 8.0 7.4  Hemoglobin 13.0 - 17.0 g/dL 13.4 13.9 14.2  Hematocrit 39.0 - 52.0 % 40.0 42.9 44.2  Platelets 150 - 400 K/uL 229 252 259    CMP Latest Ref Rng & Units 07/05/2020 06/17/2020 05/05/2020  Glucose 70 - 99 mg/dL 236(H) - 156(H)  BUN 8 - 23 mg/dL 19 24(H) 18  Creatinine 0.61 - 1.24 mg/dL 1.05 1.08 1.07  Sodium 135 - 145 mmol/L 139 - 136  Potassium 3.5 - 5.1 mmol/L 4.4 - 3.9  Chloride 98 - 111 mmol/L 104 - 104  CO2 22 - 32 mmol/L 27 - 25  Calcium 8.9 - 10.3 mg/dL 9.2 - 9.5  Total Protein 6.5 - 8.1 g/dL 7.3 - 7.6  Total Bilirubin 0.3 - 1.2 mg/dL 0.4 - 0.4  Alkaline Phos 38 - 126 U/L 75 - 101  AST 15 - 41 U/L 24 - 18  ALT 0 - 44 U/L 39 - 21   05/26/2020 Right Cervical LN Bx ([id number]):   05/08/2019 Surgical Pathology [phone]):    05/08/2019 Surgical Pathology [id number]):    RADIOGRAPHIC STUDIES: I have personally reviewed the radiological images as listed and agreed with the findings in the report. NM PET Image Initial (PI) Skull Base To Thigh  Result Date: 06/09/2020 CLINICAL DATA:  Initial treatment strategy for head neck cancer. Squamous cell carcinoma at base of tongue. EXAM: NUCLEAR MEDICINE PET SKULL BASE TO THIGH TECHNIQUE: 8.9 mCi F-18 FDG was injected intravenously. Full-ring PET imaging was performed from the skull base to thigh after the radiotracer. CT data was obtained and used for attenuation correction and anatomic localization. Fasting blood glucose: 141 mg/dl COMPARISON:  CT neck chest abdomen pelvis 05/03/2020. PET-CT 02/25/2016. FINDINGS: Mediastinal blood pool  activity: SUV max 2.5 Liver activity: SUV max NA NECK: Focal hypermetabolism in the posterior right lung demonstrates SUV max = 9.9. Hypermetabolic right level II cervical lymph nodes are hypermetabolic with SUV max = 7.7. 10 mm short axis lymph node just posterior to the right hyoid bone demonstrates SUV max = 8.3. No evidence for hypermetabolic cervical lymphadenopathy in the contralateral neck. No hypermetabolic supraclavicular metastases. Incidental CT findings: none CHEST: No hypermetabolic mediastinal or hilar nodes. No suspicious hypermetabolic pulmonary nodules on the CT scan to suggest metastatic disease. Ground-glass opacity seen in the anterior left upper lobe on previous CT imaging shows only low level FDG accumulation with SUV max = 1.4. Incidental CT findings: Atherosclerotic calcification noted abdominal aorta. Postsurgical change noted right lung and hilum. ABDOMEN/PELVIS: No abnormal hypermetabolic activity within the liver, pancreas, right adrenal gland, or spleen. Low level FDG uptake noted in the left adrenal gland, demonstrating nodular thickening with SUV max = 2.7. No hypermetabolic lymph nodes in the abdomen or pelvis. Nonspecific FDG accumulation noted in the region of the ileocecal valve. Incidental CT findings: Multiple hypoattenuating liver lesions are stable comparing back to 02/25/2016 consistent with benign etiology and previously characterized as hemangiomas. Atherosclerotic calcification noted abdominal aorta. Small right groin hernia contains only fat. SKELETON: No focal hypermetabolic activity to suggest skeletal metastasis. Incidental CT findings: Sclerotic lesion left humeral neck is not hypermetabolic. IMPRESSION: 1. Hypermetabolic lesion posterior right tongue with hypermetabolic level II cervical nodal metastases. No evidence for hypermetabolic metastases  in the contralateral neck, chest, abdomen, or pelvis. 2. 1.6 cm ground-glass opacity in the anterior left upper lobe  identified on diagnostic CT chest of 05/03/2020 shows low level FDG uptake. As well differentiated or low-grade neoplasm can be poorly FDG avid, continued surveillance is recommended. 3. Low level FDG accumulation in the left adrenal nodule, stable comparing back to at least 09/12/2016. Continued attention on follow-up recommended. 4.  Aortic Atherosclerois (ICD10-170.0) Electronically Signed   By: Misty Stanley M.D.   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 Plan -I discussed with the patient his pathology results in details showing new diagnosis of squamous cell carcinoma of the base of the tongue metastatic to right cervical lymph nodes. -We discussed that tumor is p16 positive suggests its at least partly mediated by HPV.  This by itself does suggest a better prognosis however increasingly there is data showing a smoking history of more than 30 pack years does adversely affect this favorable prognosis. -We discussed NCCN guidelines and treatment options.  He has non bulky right level 2 lymphadenopathy.  Treatment options presented were consideration of TORS surgery with lymph node dissection if deemed technically feasible by ENT.  He understands that there might be a role for adjuvant radiation or chemoradiation if he does have high risk features on pathology after surgery. -Alternative option if surgery is not technically feasible would be to consider concurrent chemotherapy and radiation.  Patient would not be the best candidate for high-dose concurrent cisplatin given his diabetic nephropathy, significant pulmonary and cardiac comorbidities  and an eye to avoid immune suppression in the setting of known extranodal marginal zone lymphoma.  If this option is to be exercised might need to go with carboplatin/Taxol weekly. -Patient has not had a detailed discussion with [facility] to discuss the technicalities and feasibility and pros and cons of primary surgical resection of his tumor and would like to pursue this. -He has been referred to see Dr. Silvio Clayman at [facility] to discuss TORS. -All the patient's and his wife's concerns were addressed in details.    -Counseled extensively on continued smoking cessation and avoiding alcohol use.    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 Masslike area of ground-glass straddles the minor fissure with septal thickening and fissural retraction. While the lesion has increased in size minimally from 09/30/2018, there is clear enlargement from baseline examination on 02/16/2016. Therefore, lesion is characterized as worrisome for adenocarcinoma, Lung-RADS 4B, suspicious. -02/25/2016 PET/CT ([id number]) revealed "Dominant 8 mm right lower lobe pulmonary nodule shows no significant FDG uptake. Other scattered less than 5 mm bilateral pulmonary nodules also show no FDG uptake but are too small to characterize by PET. Right middle lobe ground-glass opacity  shows low-grade FDG uptake, and differential diagnosis includes inflammatory or infectious processes and low-grade adenocarcinoma. Multiple low-attenuation liver masses show no  hypermetabolic activity compared to normal hepatic parenchyma. These remain indeterminate and cannot be characterized without IV contrast. Abdomen MRI without and with contrast recommended for further evaluation. 2.5 cm left adrenal mass shows mild FDG uptake, and remains indeterminate. Differential diagnosis includes atypical adenoma, metastasis, pheochromocytoma, or less likely adrenal cortical carcinoma."  PLAN: -Discussed pt labwork today, 07/05/2020; -seen and evaluated by ENT at [facility] high likelihood of needing ctx/rt even after surgery, -IProvided full chemo counseling to the pt. -Advised pt that combination of chemotherapy and radiation keeps the cancer cells from being bale to repair themselves. The standard chemo used is high dosage Cisplatin, but we did not pursue this due to pt's ongoing medical issues. We decided to go with a Carboplatin/Taxol regiment that does not have the extent of kidney and ear toxicities of Cisplatin. -Advised pt that the approach is to cure the localized cancer in his neck.   -Advised pt we will start chemotherapy tomorrow and do up to six cycles given toleration of treatment. We cannot do GF shot due to ongoing radiation. Will continue to monitor pt's counts on the fifth and sixth cycles. -Advised pt that low dose chemotherapy and radiation can both drop his blood counts. We are mainly focused on Plt and WBC as limiting factors. Will get labs same day prior to every treatment. -Advise pt to eat well, drink 48-64 oz water daily, and stay active. -Recommend pt take Multivitamin daily and Vitamin B Complex daily. -Advised pt we may need to use Gastronomy tube following 2-3 cycles of mouth sores and soreness ensues. -Advised pt to stay cautious of crowded social surroundings and infections due to fevers delaying treatments. -Advised pt we will monitor symptoms and make changes in treatment accordingly. We will monitor for yeast infections. Informed pt of potential  Sucralfate and Magic mouthwash for mouth sores. -Will see back with C1D8 in 1 week.   FOLLOW UP: -Please schedule for next 3 doses of weekly carboplatin Taxol as per orders with port flush and labs. -MD visit in 1 week on 07/13/2020 with cycle 1 day 8 of carboplatin Taxol   The total time spent in the appointment was 30 minutes and more than 50% was on counseling and direct patient cares.  All of the patient's questions were answered with apparent satisfaction. The patient knows to call the clinic with any problems, questions or concerns.   Sullivan Lone MD [place] AAHIVMS [facility] Hematology/Oncology Physician [facility]  (Office):       [phone] (Work cell):  [phone] (Fax):           [phone]  07/04/2020 10:20 AM  I, Reinaldo Raddle, am acting as scribe for Dr. Sullivan Lone, MD.      .I have reviewed the above documentation for accuracy and completeness, and I agree with the above. Brunetta Genera MD

## 2020-07-05 ENCOUNTER — Other Ambulatory Visit: Payer: Self-pay

## 2020-07-05 ENCOUNTER — Ambulatory Visit: Payer: BC Managed Care – PPO

## 2020-07-05 ENCOUNTER — Inpatient Hospital Stay: Payer: BC Managed Care – PPO

## 2020-07-05 ENCOUNTER — Ambulatory Visit
Admission: RE | Admit: 2020-07-05 | Discharge: 2020-07-05 | Disposition: A | Discharge status: Home / Self Care | Payer: BC Managed Care – PPO | Source: Ambulatory Visit | Attending: Radiation Oncology | Admitting: Radiation Oncology

## 2020-07-05 ENCOUNTER — Inpatient Hospital Stay (HOSPITAL_BASED_OUTPATIENT_CLINIC_OR_DEPARTMENT_OTHER): Payer: BC Managed Care – PPO | Admitting: Hematology

## 2020-07-05 ENCOUNTER — Other Ambulatory Visit (HOSPITAL_COMMUNITY)
Admission: RE | Admit: 2020-07-05 | Discharge: 2020-07-05 | Disposition: A | Discharge status: Home / Self Care | Payer: BC Managed Care – PPO | Source: Ambulatory Visit | Attending: Hematology | Admitting: Hematology

## 2020-07-05 VITALS — BP 137/71 | HR 85 | Temp 98.1°F | Resp 20 | Ht 71.0 in | Wt 181.0 lb

## 2020-07-05 DIAGNOSIS — C61 Malignant neoplasm of prostate: Secondary | ICD-10-CM

## 2020-07-05 DIAGNOSIS — C01 Malignant neoplasm of base of tongue: Secondary | ICD-10-CM

## 2020-07-05 DIAGNOSIS — Z23 Encounter for immunization: Secondary | ICD-10-CM | POA: Diagnosis not present

## 2020-07-05 DIAGNOSIS — C779 Secondary and unspecified malignant neoplasm of lymph node, unspecified: Secondary | ICD-10-CM | POA: Diagnosis not present

## 2020-07-05 DIAGNOSIS — Z7289 Other problems related to lifestyle: Secondary | ICD-10-CM | POA: Diagnosis not present

## 2020-07-05 DIAGNOSIS — Z20822 Contact with and (suspected) exposure to covid-19: Secondary | ICD-10-CM | POA: Diagnosis not present

## 2020-07-05 DIAGNOSIS — Z800 Family history of malignant neoplasm of digestive organs: Secondary | ICD-10-CM | POA: Diagnosis not present

## 2020-07-05 DIAGNOSIS — R911 Solitary pulmonary nodule: Secondary | ICD-10-CM | POA: Diagnosis not present

## 2020-07-05 DIAGNOSIS — Z79899 Other long term (current) drug therapy: Secondary | ICD-10-CM | POA: Diagnosis not present

## 2020-07-05 DIAGNOSIS — J449 Chronic obstructive pulmonary disease, unspecified: Secondary | ICD-10-CM | POA: Diagnosis not present

## 2020-07-05 DIAGNOSIS — Z01812 Encounter for preprocedural laboratory examination: Secondary | ICD-10-CM | POA: Insufficient documentation

## 2020-07-05 DIAGNOSIS — C884 Extranodal marginal zone B-cell lymphoma of mucosa-associated lymphoid tissue [MALT-lymphoma]: Secondary | ICD-10-CM | POA: Diagnosis not present

## 2020-07-05 DIAGNOSIS — Z8249 Family history of ischemic heart disease and other diseases of the circulatory system: Secondary | ICD-10-CM | POA: Diagnosis not present

## 2020-07-05 DIAGNOSIS — Z833 Family history of diabetes mellitus: Secondary | ICD-10-CM | POA: Diagnosis not present

## 2020-07-05 DIAGNOSIS — C770 Secondary and unspecified malignant neoplasm of lymph nodes of head, face and neck: Secondary | ICD-10-CM | POA: Diagnosis not present

## 2020-07-05 DIAGNOSIS — E119 Type 2 diabetes mellitus without complications: Secondary | ICD-10-CM | POA: Diagnosis not present

## 2020-07-05 DIAGNOSIS — Z87891 Personal history of nicotine dependence: Secondary | ICD-10-CM | POA: Diagnosis not present

## 2020-07-05 DIAGNOSIS — I700 Atherosclerosis of aorta: Secondary | ICD-10-CM | POA: Diagnosis not present

## 2020-07-05 DIAGNOSIS — Z8042 Family history of malignant neoplasm of prostate: Secondary | ICD-10-CM | POA: Diagnosis not present

## 2020-07-05 LAB — CBC WITH DIFFERENTIAL (CANCER CENTER ONLY)
Abs Immature Granulocytes: 0.03 10*3/uL (ref 0.00–0.07)
Basophils Absolute: 0.1 10*3/uL (ref 0.0–0.1)
Basophils Relative: 1 %
Eosinophils Absolute: 0.3 10*3/uL (ref 0.0–0.5)
Eosinophils Relative: 3 %
HCT: 42.9 % (ref 39.0–52.0)
Hemoglobin: 13.9 g/dL (ref 13.0–17.0)
Immature Granulocytes: 0 %
Lymphocytes Relative: 17 %
Lymphs Abs: 1.4 10*3/uL (ref 0.7–4.0)
MCH: 29.7 pg (ref 26.0–34.0)
MCHC: 32.4 g/dL (ref 30.0–36.0)
MCV: 91.7 fL (ref 80.0–100.0)
Monocytes Absolute: 0.5 10*3/uL (ref 0.1–1.0)
Monocytes Relative: 7 %
Neutro Abs: 5.7 10*3/uL (ref 1.7–7.7)
Neutrophils Relative %: 72 %
Platelet Count: 252 10*3/uL (ref 150–400)
RBC: 4.68 MIL/uL (ref 4.22–5.81)
RDW: 12.6 % (ref 11.5–15.5)
WBC Count: 8 10*3/uL (ref 4.0–10.5)
nRBC: 0 % (ref 0.0–0.2)

## 2020-07-05 LAB — CMP (CANCER CENTER ONLY)
ALT: 39 U/L (ref 0–44)
AST: 24 U/L (ref 15–41)
Albumin: 4 g/dL (ref 3.5–5.0)
Alkaline Phosphatase: 75 U/L (ref 38–126)
Anion gap: 8 (ref 5–15)
BUN: 19 mg/dL (ref 8–23)
CO2: 27 mmol/L (ref 22–32)
Calcium: 9.2 mg/dL (ref 8.9–10.3)
Chloride: 104 mmol/L (ref 98–111)
Creatinine: 1.05 mg/dL (ref 0.61–1.24)
GFR, Estimated: >60 mL/min (ref >60)
Glucose, Bld: 236 mg/dL — ABNORMAL HIGH (ref 70–99)
Potassium: 4.4 mmol/L (ref 3.5–5.1)
Sodium: 139 mmol/L (ref 135–145)
Total Bilirubin: 0.4 mg/dL (ref 0.3–1.2)
Total Protein: 7.3 g/dL (ref 6.5–8.1)

## 2020-07-05 LAB — SARS CORONAVIRUS 2 (TAT 6-24 HRS): SARS Coronavirus 2: NEGATIVE

## 2020-07-05 LAB — LACTATE DEHYDROGENASE: LDH: 143 U/L (ref 98–192)

## 2020-07-05 MED ORDER — SONAFINE EX EMUL
1.0000 "application " | Freq: Two times a day (BID) | CUTANEOUS | Status: DC
  Administered 2020-07-05: 1 via TOPICAL

## 2020-07-05 NOTE — Progress Notes (Signed)
Oncology Nurse Navigator Documentation  Met with Jose Harvey to provide PEG/port education prior to 07/07/20  placement.  Provided port educational handout, showed example, provided guidance for post-surgical dsg removal, site care.  . Using  PEG teaching device   and Teach Back, provided education for PEG use and care, including: hand hygiene, gravity bolus administration of daily water flushes and nutritional supplement, fluids and medications; care of tube insertion site including daily dressing change and cleaning; S&S of infection.   . [name] correctly verbalized procedures for and provided correct return demonstration of gravity administration of water, dressing change and site care.  . I provided written instructions for PEG flushing/dressing change in support of verbal instruction.   . I provided/described contents of Start of Care Bolus Feeding Kit (3 60 cc syringes, 2 boxes 4x4 drainage sponges, 1 package mesh briefs, 1 roll paper tape, 1 case Osmolite 1.5).  He voiced understanding he is to start using Osmolite per guidance of Nutrition. Marland Kitchen He understands I will be available for ongoing PEG support. Provided barium sulfate prep which I obtained from [facility], and reviewed instructions.   Harlow Asa RN, BSN, OCN Head & Neck Oncology Nurse [place] [facility] Phone # [phone]  Fax # [phone]

## 2020-07-05 NOTE — Progress Notes (Signed)

## 2020-07-05 NOTE — Progress Notes (Signed)
Pharmacist Chemotherapy Monitoring - Initial Assessment    Anticipated start date: 07/06/2020  Regimen:  . Are orders appropriate based on the patient's diagnosis, regimen, and cycle? Yes . Does the plan date match the patient's scheduled date? Yes . Is the sequencing of drugs appropriate? Yes . Are the premedications appropriate for the patient's regimen? Yes . Prior Authorization for treatment is: Approved o If applicable, is the correct biosimilar selected based on the patient's insurance? not applicable  Organ Function and Labs: Marland Kitchen Are dose adjustments needed based on the patient's renal function, hepatic function, or hematologic function? No . Are appropriate labs ordered prior to the start of patient's treatment? Yes . Other organ system assessment, if indicated: cisplatin: baseline audiogram . The following baseline labs, if indicated, have been ordered: cisplatin: K, Mg  Dose Assessment: . Are the drug doses appropriate? Yes . Are the following correct: o Drug concentrations Yes o IV fluid compatible with drug Yes o Administration routes Yes o Timing of therapy Yes . If applicable, does the patient have documented access for treatment and/or plans for port-a-cath placement? no . If applicable, have lifetime cumulative doses been properly documented and assessed? not applicable Lifetime Dose Tracking  No doses have been documented on this patient for the following tracked chemicals: Doxorubicin, Epirubicin, Idarubicin, Daunorubicin, Mitoxantrone, Bleomycin, Oxaliplatin, Carboplatin, Liposomal Doxorubicin  o   Toxicity Monitoring/Prevention: . The patient has the following take home antiemetics prescribed: Ondansetron, Prochlorperazine, Dexamethasone and Lorazepam . The patient has the following take home medications prescribed: N/A . Medication allergies and previous infusion related reactions, if applicable, have been reviewed and addressed. Yes . [name] current  medication list has been assessed for drug-drug interactions with their chemotherapy regimen. no significant drug-drug interactions were identified on review.  Order Review: . Are the treatment plan orders signed? Yes . Is the patient scheduled to see a provider prior to their treatment? No    I verify that I have reviewed each item in the above checklist and answered each question accordingly.  Romualdo Bolk, [place]

## 2020-07-06 ENCOUNTER — Other Ambulatory Visit: Payer: Self-pay | Admitting: Radiology

## 2020-07-06 ENCOUNTER — Ambulatory Visit: Payer: BC Managed Care – PPO

## 2020-07-06 ENCOUNTER — Inpatient Hospital Stay: Payer: BC Managed Care – PPO

## 2020-07-06 ENCOUNTER — Inpatient Hospital Stay: Payer: BC Managed Care – PPO | Attending: Hematology

## 2020-07-06 ENCOUNTER — Other Ambulatory Visit: Payer: BC Managed Care – PPO

## 2020-07-06 ENCOUNTER — Other Ambulatory Visit: Payer: Self-pay

## 2020-07-06 ENCOUNTER — Ambulatory Visit
Admission: RE | Admit: 2020-07-06 | Discharge: 2020-07-06 | Disposition: A | Discharge status: Home / Self Care | Payer: BC Managed Care – PPO | Source: Ambulatory Visit | Attending: Radiation Oncology | Admitting: Radiation Oncology

## 2020-07-06 VITALS — BP 113/63 | HR 64 | Temp 98.2°F | Resp 18

## 2020-07-06 DIAGNOSIS — C01 Malignant neoplasm of base of tongue: Secondary | ICD-10-CM | POA: Insufficient documentation

## 2020-07-06 DIAGNOSIS — Z87891 Personal history of nicotine dependence: Secondary | ICD-10-CM | POA: Diagnosis not present

## 2020-07-06 DIAGNOSIS — Z836 Family history of other diseases of the respiratory system: Secondary | ICD-10-CM | POA: Diagnosis not present

## 2020-07-06 DIAGNOSIS — Z5111 Encounter for antineoplastic chemotherapy: Secondary | ICD-10-CM | POA: Diagnosis not present

## 2020-07-06 DIAGNOSIS — C884 Extranodal marginal zone B-cell lymphoma of mucosa-associated lymphoid tissue [MALT-lymphoma]: Secondary | ICD-10-CM | POA: Insufficient documentation

## 2020-07-06 DIAGNOSIS — I700 Atherosclerosis of aorta: Secondary | ICD-10-CM | POA: Diagnosis not present

## 2020-07-06 DIAGNOSIS — Z79899 Other long term (current) drug therapy: Secondary | ICD-10-CM | POA: Insufficient documentation

## 2020-07-06 DIAGNOSIS — Z8249 Family history of ischemic heart disease and other diseases of the circulatory system: Secondary | ICD-10-CM | POA: Insufficient documentation

## 2020-07-06 DIAGNOSIS — D1809 Hemangioma of other sites: Secondary | ICD-10-CM | POA: Diagnosis not present

## 2020-07-06 DIAGNOSIS — Z833 Family history of diabetes mellitus: Secondary | ICD-10-CM | POA: Diagnosis not present

## 2020-07-06 DIAGNOSIS — Z800 Family history of malignant neoplasm of digestive organs: Secondary | ICD-10-CM | POA: Diagnosis not present

## 2020-07-06 DIAGNOSIS — C770 Secondary and unspecified malignant neoplasm of lymph nodes of head, face and neck: Secondary | ICD-10-CM | POA: Insufficient documentation

## 2020-07-06 DIAGNOSIS — R0602 Shortness of breath: Secondary | ICD-10-CM | POA: Diagnosis not present

## 2020-07-06 DIAGNOSIS — Z7289 Other problems related to lifestyle: Secondary | ICD-10-CM | POA: Diagnosis not present

## 2020-07-06 DIAGNOSIS — D3502 Benign neoplasm of left adrenal gland: Secondary | ICD-10-CM | POA: Insufficient documentation

## 2020-07-06 DIAGNOSIS — Z8546 Personal history of malignant neoplasm of prostate: Secondary | ICD-10-CM | POA: Diagnosis not present

## 2020-07-06 DIAGNOSIS — J449 Chronic obstructive pulmonary disease, unspecified: Secondary | ICD-10-CM | POA: Diagnosis not present

## 2020-07-06 DIAGNOSIS — Z7189 Other specified counseling: Secondary | ICD-10-CM

## 2020-07-06 LAB — PROSTATE-SPECIFIC AG, SERUM (LABCORP): Prostate Specific Ag, Serum: <0.1 ng/mL (ref 0.0–4.0)

## 2020-07-06 MED ORDER — SODIUM CHLORIDE 0.9 % IV SOLN
20.0000 mg | Freq: Once | INTRAVENOUS | Status: AC
  Administered 2020-07-06: 20 mg via INTRAVENOUS
  Filled 2020-07-06: qty 20

## 2020-07-06 MED ORDER — DIPHENHYDRAMINE HCL 50 MG/ML IJ SOLN
INTRAMUSCULAR | Status: AC
  Filled 2020-07-06: qty 1

## 2020-07-06 MED ORDER — SODIUM CHLORIDE 0.9 % IV SOLN
50.0000 mg/m2 | Freq: Once | INTRAVENOUS | Status: AC
  Administered 2020-07-06: 102 mg via INTRAVENOUS
  Filled 2020-07-06: qty 17

## 2020-07-06 MED ORDER — DIPHENHYDRAMINE HCL 50 MG/ML IJ SOLN
50.0000 mg | Freq: Once | INTRAMUSCULAR | Status: AC
  Administered 2020-07-06: 50 mg via INTRAVENOUS

## 2020-07-06 MED ORDER — FAMOTIDINE IN NACL 20-0.9 MG/50ML-% IV SOLN
20.0000 mg | Freq: Once | INTRAVENOUS | Status: AC
  Administered 2020-07-06: 20 mg via INTRAVENOUS

## 2020-07-06 MED ORDER — SODIUM CHLORIDE 0.9 % IV SOLN
222.8000 mg | Freq: Once | INTRAVENOUS | Status: AC
  Administered 2020-07-06: 220 mg via INTRAVENOUS
  Filled 2020-07-06: qty 22

## 2020-07-06 MED ORDER — SODIUM CHLORIDE 0.9 % IV SOLN
Freq: Once | INTRAVENOUS | Status: AC
  Filled 2020-07-06: qty 250

## 2020-07-06 MED ORDER — PALONOSETRON HCL INJECTION 0.25 MG/5ML
INTRAVENOUS | Status: AC
  Filled 2020-07-06: qty 5

## 2020-07-06 MED ORDER — FAMOTIDINE IN NACL 20-0.9 MG/50ML-% IV SOLN
INTRAVENOUS | Status: AC
  Filled 2020-07-06: qty 50

## 2020-07-06 MED ORDER — PALONOSETRON HCL INJECTION 0.25 MG/5ML
0.2500 mg | Freq: Once | INTRAVENOUS | Status: AC
  Administered 2020-07-06: 0.25 mg via INTRAVENOUS

## 2020-07-06 NOTE — Patient Instructions (Signed)
[facility] Discharge Instructions for Patients Receiving Chemotherapy  Today you received the following chemotherapy agents: paclitaxel/carboplatin.  To help prevent nausea and vomiting after your treatment, we encourage you to take your nausea medication as directed.   If you develop nausea and vomiting that is not controlled by your nausea medication, call the clinic.   BELOW ARE SYMPTOMS THAT SHOULD BE REPORTED IMMEDIATELY:  *FEVER GREATER THAN 100.5 F  *CHILLS WITH OR WITHOUT FEVER  NAUSEA AND VOMITING THAT IS NOT CONTROLLED WITH YOUR NAUSEA MEDICATION  *UNUSUAL SHORTNESS OF BREATH  *UNUSUAL BRUISING OR BLEEDING  TENDERNESS IN MOUTH AND THROAT WITH OR WITHOUT PRESENCE OF ULCERS  *URINARY PROBLEMS  *BOWEL PROBLEMS  UNUSUAL RASH Items with * indicate a potential emergency and should be followed up as soon as possible.  Feel free to call the clinic should you have any questions or concerns. The clinic phone number is [phone].  Please show the CHEMO ALERT CARD at check-in to the Emergency Department and triage nurse.  Paclitaxel injection What is this medicine? PACLITAXEL (PAK li TAX el) is a chemotherapy drug. It targets fast dividing cells, like cancer cells, and causes these cells to die. This medicine is used to treat ovarian cancer, breast cancer, lung cancer, Kaposi's sarcoma, and other cancers. This medicine may be used for other purposes; ask your health care provider or pharmacist if you have questions. COMMON BRAND NAME(S): Onxol, Taxol What should I tell my health care provider before I take this medicine? They need to know if you have any of these conditions:  history of irregular heartbeat  liver disease  low blood counts, like low white cell, platelet, or red cell counts  lung or breathing disease, like asthma  tingling of the fingers or toes, or other nerve disorder  an unusual or allergic reaction to paclitaxel, alcohol,  polyoxyethylated castor oil, other chemotherapy, other medicines, foods, dyes, or preservatives  pregnant or trying to get pregnant  breast-feeding How should I use this medicine? This drug is given as an infusion into a vein. It is administered in a hospital or clinic by a specially trained health care professional. Talk to your pediatrician regarding the use of this medicine in children. Special care may be needed. Overdosage: If you think you have taken too much of this medicine contact a poison control center or emergency room at once. NOTE: This medicine is only for you. Do not share this medicine with others. What if I miss a dose? It is important not to miss your dose. Call your doctor or health care professional if you are unable to keep an appointment. What may interact with this medicine? Do not take this medicine with any of the following medications:  live virus vaccines This medicine may also interact with the following medications:  antiviral medicines for hepatitis, HIV or AIDS  certain antibiotics like erythromycin and clarithromycin  certain medicines for fungal infections like ketoconazole and itraconazole  certain medicines for seizures like carbamazepine, phenobarbital, phenytoin  gemfibrozil  nefazodone  rifampin  St. John's wort This list may not describe all possible interactions. Give your health care provider a list of all the medicines, herbs, non-prescription drugs, or dietary supplements you use. Also tell them if you smoke, drink alcohol, or use illegal drugs. Some items may interact with your medicine. What should I watch for while using this medicine? Your condition will be monitored carefully while you are receiving this medicine. You will need important blood work   done while you are taking this medicine. This medicine can cause serious allergic reactions. To reduce your risk you will need to take other medicine(s) before treatment with this  medicine. If you experience allergic reactions like skin rash, itching or hives, swelling of the face, lips, or tongue, tell your doctor or health care professional right away. In some cases, you may be given additional medicines to help with side effects. Follow all directions for their use. This drug may make you feel generally unwell. This is not uncommon, as chemotherapy can affect healthy cells as well as cancer cells. Report any side effects. Continue your course of treatment even though you feel ill unless your doctor tells you to stop. Call your doctor or health care professional for advice if you get a fever, chills or sore throat, or other symptoms of a cold or flu. Do not treat yourself. This drug decreases your body's ability to fight infections. Try to avoid being around people who are sick. This medicine may increase your risk to bruise or bleed. Call your doctor or health care professional if you notice any unusual bleeding. Be careful brushing and flossing your teeth or using a toothpick because you may get an infection or bleed more easily. If you have any dental work done, tell your dentist you are receiving this medicine. Avoid taking products that contain aspirin, acetaminophen, ibuprofen, naproxen, or ketoprofen unless instructed by your doctor. These medicines may hide a fever. Do not become pregnant while taking this medicine. Women should inform their doctor if they wish to become pregnant or think they might be pregnant. There is a potential for serious side effects to an unborn child. Talk to your health care professional or pharmacist for more information. Do not breast-feed an infant while taking this medicine. Men are advised not to father a child while receiving this medicine. This product may contain alcohol. Ask your pharmacist or healthcare provider if this medicine contains alcohol. Be sure to tell all healthcare providers you are taking this medicine. Certain medicines,  like metronidazole and disulfiram, can cause an unpleasant reaction when taken with alcohol. The reaction includes flushing, headache, nausea, vomiting, sweating, and increased thirst. The reaction can last from 30 minutes to several hours. What side effects may I notice from receiving this medicine? Side effects that you should report to your doctor or health care professional as soon as possible:  allergic reactions like skin rash, itching or hives, swelling of the face, lips, or tongue  breathing problems  changes in vision  fast, irregular heartbeat  high or low blood pressure  mouth sores  pain, tingling, numbness in the hands or feet  signs of decreased platelets or bleeding - bruising, pinpoint red spots on the skin, black, tarry stools, blood in the urine  signs of decreased red blood cells - unusually weak or tired, feeling faint or lightheaded, falls  signs of infection - fever or chills, cough, sore throat, pain or difficulty passing urine  signs and symptoms of liver injury like dark yellow or brown urine; general ill feeling or flu-like symptoms; light-colored stools; loss of appetite; nausea; right upper belly pain; unusually weak or tired; yellowing of the eyes or skin  swelling of the ankles, feet, hands  unusually slow heartbeat Side effects that usually do not require medical attention (report to your doctor or health care professional if they continue or are bothersome):  diarrhea  hair loss  loss of appetite  muscle or joint pain    nausea, vomiting  pain, redness, or irritation at site where injected  tiredness This list may not describe all possible side effects. Call your doctor for medical advice about side effects. You may report side effects to FDA at 1-800-FDA-1088. Where should I keep my medicine? This drug is given in a hospital or clinic and will not be stored at home. NOTE: This sheet is a summary. It may not cover all possible information.  If you have questions about this medicine, talk to your doctor, pharmacist, or health care provider.  2021 Elsevier/Gold Standard (2019-04-23 13:37:23)  Carboplatin injection What is this medicine? CARBOPLATIN (KAR boe pla tin) is a chemotherapy drug. It targets fast dividing cells, like cancer cells, and causes these cells to die. This medicine is used to treat ovarian cancer and many other cancers. This medicine may be used for other purposes; ask your health care provider or pharmacist if you have questions. COMMON BRAND NAME(S): Paraplatin What should I tell my health care provider before I take this medicine? They need to know if you have any of these conditions:  blood disorders  hearing problems  kidney disease  recent or ongoing radiation therapy  an unusual or allergic reaction to carboplatin, cisplatin, other chemotherapy, other medicines, foods, dyes, or preservatives  pregnant or trying to get pregnant  breast-feeding How should I use this medicine? This drug is usually given as an infusion into a vein. It is administered in a hospital or clinic by a specially trained health care professional. Talk to your pediatrician regarding the use of this medicine in children. Special care may be needed. Overdosage: If you think you have taken too much of this medicine contact a poison control center or emergency room at once. NOTE: This medicine is only for you. Do not share this medicine with others. What if I miss a dose? It is important not to miss a dose. Call your doctor or health care professional if you are unable to keep an appointment. What may interact with this medicine?  medicines for seizures  medicines to increase blood counts like filgrastim, pegfilgrastim, sargramostim  some antibiotics like amikacin, gentamicin, neomycin, streptomycin, tobramycin  vaccines Talk to your doctor or health care professional before taking any of these  medicines:  acetaminophen  aspirin  ibuprofen  ketoprofen  naproxen This list may not describe all possible interactions. Give your health care provider a list of all the medicines, herbs, non-prescription drugs, or dietary supplements you use. Also tell them if you smoke, drink alcohol, or use illegal drugs. Some items may interact with your medicine. What should I watch for while using this medicine? Your condition will be monitored carefully while you are receiving this medicine. You will need important blood work done while you are taking this medicine. This drug may make you feel generally unwell. This is not uncommon, as chemotherapy can affect healthy cells as well as cancer cells. Report any side effects. Continue your course of treatment even though you feel ill unless your doctor tells you to stop. In some cases, you may be given additional medicines to help with side effects. Follow all directions for their use. Call your doctor or health care professional for advice if you get a fever, chills or sore throat, or other symptoms of a cold or flu. Do not treat yourself. This drug decreases your body's ability to fight infections. Try to avoid being around people who are sick. This medicine may increase your risk to bruise or bleed.   Call your doctor or health care professional if you notice any unusual bleeding. Be careful brushing and flossing your teeth or using a toothpick because you may get an infection or bleed more easily. If you have any dental work done, tell your dentist you are receiving this medicine. Avoid taking products that contain aspirin, acetaminophen, ibuprofen, naproxen, or ketoprofen unless instructed by your doctor. These medicines may hide a fever. Do not become pregnant while taking this medicine. Women should inform their doctor if they wish to become pregnant or think they might be pregnant. There is a potential for serious side effects to an unborn child. Talk  to your health care professional or pharmacist for more information. Do not breast-feed an infant while taking this medicine. What side effects may I notice from receiving this medicine? Side effects that you should report to your doctor or health care professional as soon as possible:  allergic reactions like skin rash, itching or hives, swelling of the face, lips, or tongue  signs of infection - fever or chills, cough, sore throat, pain or difficulty passing urine  signs of decreased platelets or bleeding - bruising, pinpoint red spots on the skin, black, tarry stools, nosebleeds  signs of decreased red blood cells - unusually weak or tired, fainting spells, lightheadedness  breathing problems  changes in hearing  changes in vision  chest pain  high blood pressure  low blood counts - This drug may decrease the number of white blood cells, red blood cells and platelets. You may be at increased risk for infections and bleeding.  nausea and vomiting  pain, swelling, redness or irritation at the injection site  pain, tingling, numbness in the hands or feet  problems with balance, talking, walking  trouble passing urine or change in the amount of urine Side effects that usually do not require medical attention (report to your doctor or health care professional if they continue or are bothersome):  hair loss  loss of appetite  metallic taste in the mouth or changes in taste This list may not describe all possible side effects. Call your doctor for medical advice about side effects. You may report side effects to FDA at 1-800-FDA-1088. Where should I keep my medicine? This drug is given in a hospital or clinic and will not be stored at home. NOTE: This sheet is a summary. It may not cover all possible information. If you have questions about this medicine, talk to your doctor, pharmacist, or health care provider.  2021 Elsevier/Gold Standard (2007-08-27 14:38:05)   

## 2020-07-06 NOTE — Progress Notes (Signed)
Nutrition Follow-up:  Patient with base of the tongue cancer, HPV 16 +.  Patient receiving taxol/carboplatin.   Met with patient during first infusion today.  Planning PEG placement tomorrow 2/2.  Patient has already been educated on tube care by [employer].  Patient concerned about carbohydrate content of osmolite 1.5 (sample box given).  Patient reports appetite continues to be good and eating all consistencies of foods.      Medications: reviewed  Labs: reviewed  Anthropometrics:   Weight 181 today stable 182 lb on 1/13  UBW in the 180s   Estimated Energy Needs  Kcals: 3462-1947 Protein: 125-145 g Fluid: > 2.5 L  NUTRITION DIAGNOSIS: Predicted suboptimal energy intake continues   INTERVENTION:  Reassured patient that RD will monitor blood glucose and tube feeding and make adjustments as needed.  Patient will flush feeding tube with 55m of water daily until oral intake decreases and tube feeding is started. Encouraged patient to continue eating orally      MONITORING, EVALUATION, GOAL: weight trends, intake   NEXT VISIT: Feb 8 (Tuesday with Barb)  Temple Ewart B. AZenia Resides RCobb LHuntingdonRegistered Dietitian [phone](mobile)

## 2020-07-07 ENCOUNTER — Encounter (HOSPITAL_COMMUNITY): Payer: Self-pay

## 2020-07-07 ENCOUNTER — Ambulatory Visit: Payer: BC Managed Care – PPO

## 2020-07-07 ENCOUNTER — Telehealth: Payer: Self-pay | Admitting: *Deleted

## 2020-07-07 ENCOUNTER — Other Ambulatory Visit: Payer: Self-pay

## 2020-07-07 ENCOUNTER — Encounter: Payer: BC Managed Care – PPO | Admitting: Nutrition

## 2020-07-07 ENCOUNTER — Ambulatory Visit (HOSPITAL_COMMUNITY)
Admission: RE | Admit: 2020-07-07 | Discharge: 2020-07-07 | Disposition: A | Discharge status: Home / Self Care | Payer: BC Managed Care – PPO | Source: Ambulatory Visit | Attending: Hematology | Admitting: Hematology

## 2020-07-07 ENCOUNTER — Ambulatory Visit
Admission: RE | Admit: 2020-07-07 | Discharge: 2020-07-07 | Disposition: A | Discharge status: Home / Self Care | Payer: BC Managed Care – PPO | Source: Ambulatory Visit | Attending: Radiation Oncology | Admitting: Radiation Oncology

## 2020-07-07 DIAGNOSIS — Z7982 Long term (current) use of aspirin: Secondary | ICD-10-CM | POA: Diagnosis not present

## 2020-07-07 DIAGNOSIS — C029 Malignant neoplasm of tongue, unspecified: Secondary | ICD-10-CM | POA: Insufficient documentation

## 2020-07-07 DIAGNOSIS — Z8572 Personal history of non-Hodgkin lymphomas: Secondary | ICD-10-CM | POA: Diagnosis not present

## 2020-07-07 DIAGNOSIS — Z79899 Other long term (current) drug therapy: Secondary | ICD-10-CM | POA: Diagnosis not present

## 2020-07-07 DIAGNOSIS — C01 Malignant neoplasm of base of tongue: Secondary | ICD-10-CM | POA: Diagnosis not present

## 2020-07-07 DIAGNOSIS — Z87891 Personal history of nicotine dependence: Secondary | ICD-10-CM | POA: Diagnosis not present

## 2020-07-07 DIAGNOSIS — Z888 Allergy status to other drugs, medicaments and biological substances status: Secondary | ICD-10-CM | POA: Diagnosis not present

## 2020-07-07 DIAGNOSIS — Z452 Encounter for adjustment and management of vascular access device: Secondary | ICD-10-CM | POA: Diagnosis not present

## 2020-07-07 DIAGNOSIS — Z794 Long term (current) use of insulin: Secondary | ICD-10-CM | POA: Diagnosis not present

## 2020-07-07 DIAGNOSIS — C770 Secondary and unspecified malignant neoplasm of lymph nodes of head, face and neck: Secondary | ICD-10-CM | POA: Diagnosis not present

## 2020-07-07 HISTORY — PX: IR GASTROSTOMY TUBE MOD SED: IMG625

## 2020-07-07 HISTORY — PX: IR IMAGING GUIDED PORT INSERTION: IMG5740

## 2020-07-07 LAB — CBC WITH DIFFERENTIAL/PLATELET
Abs Immature Granulocytes: 0.04 10*3/uL (ref 0.00–0.07)
Basophils Absolute: 0 10*3/uL (ref 0.0–0.1)
Basophils Relative: 0 %
Eosinophils Absolute: 0 10*3/uL (ref 0.0–0.5)
Eosinophils Relative: 0 %
HCT: 40 % (ref 39.0–52.0)
Hemoglobin: 13.4 g/dL (ref 13.0–17.0)
Immature Granulocytes: 0 %
Lymphocytes Relative: 7 %
Lymphs Abs: 0.7 10*3/uL (ref 0.7–4.0)
MCH: 30.6 pg (ref 26.0–34.0)
MCHC: 33.5 g/dL (ref 30.0–36.0)
MCV: 91.3 fL (ref 80.0–100.0)
Monocytes Absolute: 0.4 10*3/uL (ref 0.1–1.0)
Monocytes Relative: 4 %
Neutro Abs: 8.9 10*3/uL — ABNORMAL HIGH (ref 1.7–7.7)
Neutrophils Relative %: 89 %
Platelets: 229 10*3/uL (ref 150–400)
RBC: 4.38 MIL/uL (ref 4.22–5.81)
RDW: 12.4 % (ref 11.5–15.5)
WBC: 10.1 10*3/uL (ref 4.0–10.5)
nRBC: 0 % (ref 0.0–0.2)

## 2020-07-07 LAB — GLUCOSE, CAPILLARY: Glucose-Capillary: 248 mg/dL — ABNORMAL HIGH (ref 70–99)

## 2020-07-07 LAB — PROTIME-INR
INR: 1 (ref 0.8–1.2)
Prothrombin Time: 12.6 seconds (ref 11.4–15.2)

## 2020-07-07 MED ORDER — LIDOCAINE-EPINEPHRINE 1 %-1:100000 IJ SOLN
INTRAMUSCULAR | Status: DC | PRN
  Administered 2020-07-07 (×2): 10 mL

## 2020-07-07 MED ORDER — MIDAZOLAM HCL 2 MG/2ML IJ SOLN
INTRAMUSCULAR | Status: DC | PRN
  Administered 2020-07-07: 2 mg via INTRAVENOUS
  Administered 2020-07-07 (×2): 1 mg via INTRAVENOUS
  Administered 2020-07-07: 2 mg via INTRAVENOUS

## 2020-07-07 MED ORDER — IOHEXOL 300 MG/ML  SOLN
50.0000 mL | Freq: Once | INTRAMUSCULAR | Status: AC | PRN
  Administered 2020-07-07: 20 mL

## 2020-07-07 MED ORDER — HEPARIN SOD (PORK) LOCK FLUSH 100 UNIT/ML IV SOLN
INTRAVENOUS | Status: AC
  Filled 2020-07-07: qty 5

## 2020-07-07 MED ORDER — MIDAZOLAM HCL 2 MG/2ML IJ SOLN
INTRAMUSCULAR | Status: AC
  Filled 2020-07-07: qty 6

## 2020-07-07 MED ORDER — LIDOCAINE HCL 1 % IJ SOLN
INTRAMUSCULAR | Status: AC
  Filled 2020-07-07: qty 20

## 2020-07-07 MED ORDER — CEFAZOLIN SODIUM-DEXTROSE 2-4 GM/100ML-% IV SOLN
INTRAVENOUS | Status: AC
  Filled 2020-07-07: qty 100

## 2020-07-07 MED ORDER — CEFAZOLIN SODIUM-DEXTROSE 2-4 GM/100ML-% IV SOLN
2.0000 g | INTRAVENOUS | Status: AC
  Administered 2020-07-07: 2 g via INTRAVENOUS

## 2020-07-07 MED ORDER — DIPHENHYDRAMINE HCL 50 MG/ML IJ SOLN
INTRAMUSCULAR | Status: AC
  Filled 2020-07-07: qty 1

## 2020-07-07 MED ORDER — SODIUM CHLORIDE 0.9 % IV SOLN: INTRAVENOUS | Status: DC

## 2020-07-07 MED ORDER — FENTANYL CITRATE (PF) 100 MCG/2ML IJ SOLN
INTRAMUSCULAR | Status: DC | PRN
  Administered 2020-07-07 (×2): 50 ug via INTRAVENOUS

## 2020-07-07 MED ORDER — LIDOCAINE VISCOUS HCL 2 % MT SOLN
OROMUCOSAL | Status: AC
  Filled 2020-07-07: qty 15

## 2020-07-07 MED ORDER — FENTANYL CITRATE (PF) 100 MCG/2ML IJ SOLN
INTRAMUSCULAR | Status: AC
  Filled 2020-07-07: qty 4

## 2020-07-07 MED ORDER — LIDOCAINE-EPINEPHRINE 1 %-1:100000 IJ SOLN
INTRAMUSCULAR | Status: AC
  Filled 2020-07-07: qty 1

## 2020-07-07 MED ORDER — LIDOCAINE-EPINEPHRINE (PF) 2 %-1:200000 IJ SOLN
INTRAMUSCULAR | Status: AC
  Filled 2020-07-07: qty 20

## 2020-07-07 MED ORDER — SODIUM CHLORIDE (PF) 0.9 % IJ SOLN
INTRAMUSCULAR | Status: AC
  Filled 2020-07-07: qty 10

## 2020-07-07 MED ORDER — DIPHENHYDRAMINE HCL 50 MG/ML IJ SOLN
INTRAMUSCULAR | Status: DC | PRN
  Administered 2020-07-07: 25 mg via INTRAVENOUS

## 2020-07-07 NOTE — Sedation Documentation (Signed)
Completed case

## 2020-07-07 NOTE — Sedation Documentation (Signed)
Port finished 

## 2020-07-07 NOTE — H&P (Signed)
Referring Physician(s): Brunetta Genera  Supervising Physician: Daryll Brod  Patient Status:  [facility]  Chief Complaint:  "I'm here for a port and stomach tube"  Subjective: Patient familiar to IR service from right cervical lymph node biopsy on 05/26/2020.  He has a history of MALT lymphoma as well as newly diagnosed squamous cell carcinoma of the base of the tongue and presents today for both Port-A-Cath and gastrostomy tube placements while undergoing chemoradiation.  He currently denies fever, headache, chest pain, dyspnea, cough, abdominal/back pain, nausea, vomiting or bleeding.  Additional history as below.  Past Medical History:  Diagnosis Date  . Adrenal adenoma, left 03/2017   noted on CT  . Allergic rhinitis, cause unspecified   . Anxiety state, unspecified   . Aortic atherosclerosis ([place]) 03/2017   noted on CT  . BPV (benign positional vertigo)   . COPD (chronic obstructive pulmonary disease) with emphysema (HCC)    mild followed by [facility]   . Coronary artery disease   . Depressive disorder, not elsewhere classified   . Dyspnea    history of  . ED (erectile dysfunction)   . Gallbladder polyp 03/2016   noted on CT  . Hepatic hemangioma 03/2017   noted on CT  . History of kidney stones    hx of years ago   . Indigestion   . 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 Mixed hyperlipidemia   . Prostate cancer ([place])   . 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 Type I (juvenile type) diabetes mellitus without mention of complication, not stated as uncontrolled    type I - followed by Dr Buddy Duty   . Vesicoureteral reflux    Past Surgical History:  Procedure Laterality Date  . CHOLECYSTECTOMY N/A 05/17/2016   Procedure: LAPAROSCOPIC CHOLECYSTECTOMY;  Surgeon: Clovis Riley, MD;  Location: [facility];  Service: General;  Laterality: N/A;  . cyst removed from throat      . dental surgeries     . LYMPHADENECTOMY Bilateral 07/02/2017   Procedure: BILATERAL LYMPHADENECTOMY;  Surgeon: Lucas Mallow, MD;  Location: [facility];  Service: Urology;  Laterality: Bilateral;  . PROSTATE BIOPSY    . ROBOT ASSISTED LAPAROSCOPIC RADICAL PROSTATECTOMY N/A 07/02/2017   Procedure: XI ROBOTIC ASSISTED LAPAROSCOPIC RADICAL PROSTATECTOMY;  Surgeon: Lucas Mallow, MD;  Location: [facility];  Service: Urology;  Laterality: N/A;  . TONSILLECTOMY    . VIDEO ASSISTED THORACOSCOPY (VATS)/ LOBECTOMY Right 05/08/2019   Procedure: Right VIDEO ASSISTED THORACOSCOPY with   MIDDLE LOBECTOMY and [place] portion of Upper Lobe with Node dissection, Intercostal Nerve Block;  Surgeon: Melrose Nakayama, MD;  Location: [place];  Service: Thoracic;  Laterality: Right;  . WISDOM TOOTH EXTRACTION        Allergies: Crestor [rosuvastatin], Lipitor [atorvastatin], Other, and Zocor [simvastatin]  Medications: Prior to Admission medications   Medication Sig Start Date End Date Taking? Authorizing Provider  aspirin 81 MG tablet Take 1 tablet (81 mg total) by mouth daily. 07/07/17   Filippou, Braxton Feathers, MD  cetirizine (ZYRTEC) 10 MG tablet Take 10 mg by mouth daily.    [provider]  chlorhexidine (PERIDEX) 0.12 % solution Use as directed 15 mLs in the mouth or throat daily.    [provider]  Coenzyme Q10 (COQ10) 100 MG CAPS Take 100 mg by mouth daily.    [provider]  Continuous Blood Gluc Sensor (FREESTYLE [place]  14 DAY SENSOR) MISC AS DIRECTED CHANGE SENSOR EVERY 14 DAYS 28 09/26/19   [provider]  dexamethasone (DECADRON) 4 MG tablet Take 2 tablets (8 mg total) by mouth daily. Start the day after chemotherapy for 2 days. 06/29/20   Brunetta Genera, MD  ibuprofen (ADVIL) 200 MG tablet Take 400-800 mg by mouth every 6 (six) hours as needed for moderate pain. Patient not taking: Reported on 06/17/2020    [provider]  Insulin Human (INSULIN  PUMP) SOLN Inject into the skin continuous. NOVOLOG 100 UNIT/ML VIAL    [provider]  lidocaine-prilocaine (EMLA) cream Apply to affected area once 06/29/20   Brunetta Genera, MD  lisinopril (PRINIVIL,ZESTRIL) 5 MG tablet Take 5 mg by mouth daily.  09/09/13   [provider]  LORazepam (ATIVAN) 0.5 MG tablet Take 1 tablet (0.5 mg total) by mouth every 6 (six) hours as needed (Nausea or vomiting). 06/29/20   Brunetta Genera, MD  Multiple Vitamins-Minerals (ICAPS AREDS 2) CAPS Take 1 capsule by mouth daily.    [provider]  NOVOLOG 100 UNIT/ML injection Averages about 30 units per day via insulin pump 08/09/19   [provider]  ondansetron (ZOFRAN) 8 MG tablet Take 1 tablet (8 mg total) by mouth 2 (two) times daily as needed for refractory nausea / vomiting. Start on day 3 after chemo. 06/29/20   Brunetta Genera, MD  pantoprazole (PROTONIX) 40 MG tablet Take 40 mg by mouth daily.    [provider]  prochlorperazine (COMPAZINE) 10 MG tablet Take 1 tablet (10 mg total) by mouth every 6 (six) hours as needed (Nausea or vomiting). 06/29/20   Brunetta Genera, MD  rosuvastatin (CRESTOR) 5 MG tablet Take 1 tablet (5 mg total) by mouth daily. 09/12/16   Dorothy Spark, MD  sertraline (ZOLOFT) 100 MG tablet Take 50 mg by mouth daily. 01/25/16   [provider]     Vital Signs: Blood pressure 134/71, temperature 98, heart rate 93, respirations 20, O2 sat 100% room air    Physical Exam awake, alert.  Chest clear to auscultation bilaterally.  Heart with regular rate and rhythm.  Abdomen soft, positive bowel sounds, nontender.  No lower extremity edema.  Imaging: No results found.  Labs:  CBC: Recent Labs    05/05/20 1317 05/26/20 0605 07/05/20 1459 07/07/20 0815  WBC 11.8* 7.4 8.0 10.1  HGB 14.6 14.2 13.9 13.4  HCT 44.3 44.2 42.9 40.0  PLT 243 259 252 229    COAGS: Recent Labs    05/26/20 0605 07/07/20 0815  INR  1.0 1.0    BMP: Recent Labs    10/31/19 0736 02/19/20 1422 05/03/20 0801 05/05/20 1317 06/17/20 1023 07/05/20 1459  NA 138 137  --  136  --  139  K 5.0 4.9  --  3.9  --  4.4  CL 103 103  --  104  --  104  CO2 24 28  --  25  --  27  GLUCOSE 182* 261*  --  156*  --  236*  BUN 26* 18  --  18 24* 19  CALCIUM 9.0 9.2  --  9.5  --  9.2  CREATININE 1.12 1.15 1.00 1.07 1.08 1.05  GFRNONAA >60 >60  --  >60 >60 >60  GFRAA >60 >60  --   --   --   --     LIVER FUNCTION TESTS: Recent Labs    10/31/19 0736 02/19/20  1422 05/05/20 1317 07/05/20 1459  BILITOT 0.5 0.4 0.4 0.4  AST 20 23 18 24   ALT 25 30 21  39  ALKPHOS 92 92 101 75  PROT 7.0 7.4 7.6 7.3  ALBUMIN 3.8 3.8 3.9 4.0    Assessment and Plan: Patient familiar to IR service from right cervical lymph node biopsy on 05/26/2020.  He has a history of MALT lymphoma as well as newly diagnosed squamous cell carcinoma of the base of the tongue and presents today for both Port-A-Cath and gastrostomy tube placements while undergoing chemoradiation.  Details/risks of procedures, including but not limited to, internal bleeding, infection, injury to adjacent structures discussed with patient with his understanding and consent.   Electronically Signed: D. Rowe Robert, PA-C 07/07/2020, 9:21 AM   I spent a total of 30 minutes at the the patient's bedside AND on the patient's hospital floor or unit, greater than 50% of which was counseling/coordinating care for Port-A-Cath and gastrostomy tube placements

## 2020-07-07 NOTE — Discharge Instructions (Signed)
Please call Interventional Radiology clinic [phone] with any questions or concerns.  You may remove your dressing and shower tomorrow.  DO NOT use EMLA cream for 2 weeks after port placement as this cream will remove surgical glue on your incision.   Implanted Port Insertion, Care After This sheet gives you information about how to care for yourself after your procedure. Your health care provider may also give you more specific instructions. If you have problems or questions, contact your health care provider. What can I expect after the procedure? After the procedure, it is common to have:  Discomfort at the port insertion site.  Bruising on the skin over the port. This should improve over 3-4 days. Follow these instructions at home: Port care  After your port is placed, you will get a manufacturer's information card. The card has information about your port. Keep this card with you at all times.  Take care of the port as told by your health care provider. Ask your health care provider if you or a family member can get training for taking care of the port at home. A home health care nurse may also take care of the port.  Make sure to remember what type of port you have. Incision care  Follow instructions from your health care provider about how to take care of your port insertion site. Make sure you: ? Wash your hands with soap and water before and after you change your bandage (dressing). If soap and water are not available, use hand sanitizer. ? Change your dressing as told by your health care provider. ? Leave stitches (sutures), skin glue, or adhesive strips in place. These skin closures may need to stay in place for 2 weeks or longer. If adhesive strip edges start to loosen and curl up, you may trim the loose edges. Do not remove adhesive strips completely unless your health care provider tells you to do that.  Check your port insertion site every day for signs of infection.  Check for: ? Redness, swelling, or pain. ? Fluid or blood. ? Warmth. ? Pus or a bad smell.      Activity  Return to your normal activities as told by your health care provider. Ask your health care provider what activities are safe for you.  Do not lift anything that is heavier than 10 lb (4.5 kg), or the limit that you are told, until your health care provider says that it is safe. General instructions  Take over-the-counter and prescription medicines only as told by your health care provider.  Do not take baths, swim, or use a hot tub until your health care provider approves. Ask your health care provider if you may take showers. You may only be allowed to take sponge baths.  Do not drive for 24 hours if you were given a sedative during your procedure.  Wear a medical alert bracelet in case of an emergency. This will tell any health care providers that you have a port.  Keep all follow-up visits as told by your health care provider. This is important. Contact a health care provider if:  You cannot flush your port with saline as directed, or you cannot draw blood from the port.  You have a fever or chills.  You have redness, swelling, or pain around your port insertion site.  You have fluid or blood coming from your port insertion site.  Your port insertion site feels warm to the touch.  You have pus or a   bad smell coming from the port insertion site. Get help right away if:  You have chest pain or shortness of breath.  You have bleeding from your port that you cannot control. Summary  Take care of the port as told by your health care provider. Keep the manufacturer's information card with you at all times.  Change your dressing as told by your health care provider.  Contact a health care provider if you have a fever or chills or if you have redness, swelling, or pain around your port insertion site.  Keep all follow-up visits as told by your health care  provider. This information is not intended to replace advice given to you by your health care provider. Make sure you discuss any questions you have with your health care provider. Document Revised: 12/18/2017 Document Reviewed: 12/18/2017 Elsevier Patient Education  [employer].    Gastrostomy Tube Home Guide, Adult A gastrostomy tube, or G-tube, is a tube that is inserted through the abdomen into the stomach. The tube is used to give feedings and medicines when a person cannot eat and drink enough on his or her own or take medicines by mouth. How to care for the insertion site Supplies needed:  Saline solution or clean, warm water and soap. Saline solution is made of salt and water.  Cotton swab or gauze.  Pre-cut gauze bandage (dressing) and tape, if needed. Instructions Follow these steps daily to clean the insertion site: 1. Wash your hands with soap and water for at least 20 seconds. 2. Remove the dressing (if there is one) that is between the person's skin and the tube. 3. Check the area where the tube enters the skin. Check daily for problems such as: ? Redness, rash, or irritation. ? Swelling. ? Pus-like drainage. ? Extra skin growth. 4. Moisten the cotton swab or gauze with the saline solution or with a soap-and-water mixture. Gently clean around the insertion site. Remove any drainage or crusted material. ? When the G-tube is first put in, a normal saline solution or water can be used to clean the skin. ? After the skin around the tube has healed, mild soap and water may be used. 5. Apply a dressing (if there should be one) between the person's skin and the tube.   How to flush a G-tube Flush the G-tube regularly to keep it from clogging. Flush it before and after feedings and as often as told by the health care provider. Supplies needed:  Purified or germ-free (sterile) water, warmed.  Container with lid for boiling water, if needed.  60 cc G-tube  syringe. Instructions Before you begin, decide whether to use sterile water or purified drinking water.  Use only sterile water if: ? The person has a weak disease-fighting (immune) system. ? The person has trouble fighting off infections (is immunocompromised). ? You are unsure about the amount of chemical contaminants in purified or drinking water.  Use purified drinking water in all other cases. To purify drinking water by boiling: ? Boil water for at least 1 minute. Keep lid over water while it boils. ? Let water cool to room temperature before using. Follow these steps to flush the G-tube: 1. Wash your hands with soap and water for at least 20 seconds. 2. Bring out (draw up) 30 mL of warm water in a syringe. 3. Connect the syringe to the tube. 4. Slowly and gently push the water into the tube. General tips  If the tube comes out: ?  Cover the opening with a clean dressing and tape. ? Get help right away.  If there is skin or scar tissue growing where the tube enters the skin: ? Keep the area clean and dry. ? Secure the tube with tape so that the tube does not move around too much.  If the tube gets clogged: ? Slowly push warm water into the tube with a large syringe. ? Do not force the fluid into the tube or push an object into the tube. ? Get help right away if you cannot unclog the tube. Follow these instructions at home: Feedings  Give feedings at room temperature.  If feedings are continuous: ? Do not put more than 4 hours' worth of feedings in the feeding bag. ? Stop the feedings when you need to give medicine or flush the tube. Be sure to restart the feedings. ? Make sure the person's head is above his or her stomach (upright position). This will prevent choking and discomfort.  Make sure the person is in the right position during and after feedings. ? During feedings, have the person in the upright position. ? After a non-continuous feeding (bolus feeding), have  the person stay in the upright position for 1 hour.  Cover and place unused feedings in the refrigerator.  Replace feeding bags and syringes as told. Good hygiene  Make sure the person takes good care of his or her mouth and teeth (oral hygiene), such as by brushing his or her teeth.  Keep the area where the tube enters the skin clean and dry. General instructions  Use syringes made only for G-tubes.  Do not pull or put tension on the tube.  Before you remove the tube cap or disconnect a syringe, close the tube by using a clamp (clamping) or bending (kinking) the tube.  Measure the length of the G-tube every day from the insertion site to the end of the tube.  If the person's G-tube has a balloon, check the fluid in the balloon every week. Check the manufacturer's specifications to find the amount of fluid that should be in the balloon.  Remove excess air from the G-tube as told. This is called venting.  Do not push feedings, medicines, or flushes fast. Contact a health care provider if:  The person with the tube has constipation or a fever.  A large amount of fluid or mucus-like liquid is leaking from the tube.  Skin or scar tissue appears to be growing where the tube enters the skin.  The length of tube from the insertion site to the G-tube gets longer. Get help right away if:  The person with the tube has any of these problems: ? Severe pain, tenderness, or bloating in the abdomen. ? Nausea or vomiting. ? Trouble breathing or shortness of breath.  Any of these problems happen in the area where the tube enters the skin: ? Redness, irritation, swelling, or soreness. ? Pus-like discharge. ? A bad smell.  The tube is clogged and cannot be flushed.  The tube comes out. The tube will need to put back in within 4 hours. Summary  A gastrostomy tube, or G-tube, is a tube that is inserted through the abdomen into the stomach. The tube is used to give feedings and medicines  when a person cannot eat and drink enough on his or her own or cannot take medicine by mouth.  Check and clean the insertion site daily as told by the person's health care provider.  Flush  the G-tube regularly to keep it from clogging. Flush it before and after feedings and as often as told.  Keep the area where the tube enters the skin clean and dry. This information is not intended to replace advice given to you by your health care provider. Make sure you discuss any questions you have with your health care provider. Document Revised: 10/06/2019 Document Reviewed: 10/09/2019 Elsevier Patient Education  2021 [place].   Moderate Conscious Sedation, Adult, Care After This sheet gives you information about how to care for yourself after your procedure. Your health care provider may also give you more specific instructions. If you have problems or questions, contact your health care provider. What can I expect after the procedure? After the procedure, it is common to have:  Sleepiness for several hours.  Impaired judgment for several hours.  Difficulty with balance.  Vomiting if you eat too soon. Follow these instructions at home: For the time period you were told by your health care provider:  Rest.  Do not participate in activities where you could fall or become injured.  Do not drive or use machinery.  Do not drink alcohol.  Do not take sleeping pills or medicines that cause drowsiness.  Do not make important decisions or sign legal documents.  Do not take care of children on your own.      Eating and drinking  Follow the diet recommended by your health care provider.  Drink enough fluid to keep your urine pale yellow.  If you vomit: ? Drink water, juice, or soup when you can drink without vomiting. ? Make sure you have little or no nausea before eating solid foods.   General instructions  Take over-the-counter and prescription medicines only as told by your  health care provider.  Have a responsible adult stay with you for the time you are told. It is important to have someone help care for you until you are awake and alert.  Do not smoke.  Keep all follow-up visits as told by your health care provider. This is important. Contact a health care provider if:  You are still sleepy or having trouble with balance after 24 hours.  You feel light-headed.  You keep feeling nauseous or you keep vomiting.  You develop a rash.  You have a fever.  You have redness or swelling around the IV site. Get help right away if:  You have trouble breathing.  You have new-onset confusion at home. Summary  After the procedure, it is common to feel sleepy, have impaired judgment, or feel nauseous if you eat too soon.  Rest after you get home. Know the things you should not do after the procedure.  Follow the diet recommended by your health care provider and drink enough fluid to keep your urine pale yellow.  Get help right away if you have trouble breathing or new-onset confusion at home. This information is not intended to replace advice given to you by your health care provider. Make sure you discuss any questions you have with your health care provider. Document Revised: 09/19/2019 Document Reviewed: 04/17/2019 Elsevier Patient Education  2021 [employer].

## 2020-07-07 NOTE — Procedures (Signed)
Interventional Radiology Procedure Note  Procedure:  1) Port placement 2) Gastrostomy tube placement  Findings: Please refer to procedural dictation for full description.  Right IJ port, single lumen.  20 Fr balloon-retention gastrostomy.  Complications: None immediate  Estimated Blood Loss: < 5 mL  Recommendations: Port ready for immediate use. Gastrotomy OK for use tomorrow morning (07/08/20).   Ruthann Cancer, MD Pager: [phone]

## 2020-07-07 NOTE — Progress Notes (Signed)
Arrived 11:48

## 2020-07-07 NOTE — Telephone Encounter (Signed)
Called pt to see how he did with his treatment yest.  He reports doing well.  He took his ativan last eve & dexamethasone & compazine this am.  He is at hospital for port placement today.  He reports knowing how to reach [place] with any concerns.

## 2020-07-07 NOTE — Sedation Documentation (Signed)
start g tube

## 2020-07-08 ENCOUNTER — Other Ambulatory Visit: Payer: Self-pay

## 2020-07-08 ENCOUNTER — Ambulatory Visit: Payer: BC Managed Care – PPO | Attending: Radiation Oncology

## 2020-07-08 ENCOUNTER — Encounter: Payer: Self-pay | Admitting: Physical Therapy

## 2020-07-08 ENCOUNTER — Ambulatory Visit
Admission: RE | Admit: 2020-07-08 | Discharge: 2020-07-08 | Disposition: A | Discharge status: Home / Self Care | Payer: BC Managed Care – PPO | Source: Ambulatory Visit | Attending: Radiation Oncology | Admitting: Radiation Oncology

## 2020-07-08 ENCOUNTER — Telehealth: Payer: Self-pay | Admitting: Hematology

## 2020-07-08 ENCOUNTER — Ambulatory Visit: Payer: BC Managed Care – PPO

## 2020-07-08 ENCOUNTER — Ambulatory Visit: Payer: BC Managed Care – PPO | Admitting: Physical Therapy

## 2020-07-08 DIAGNOSIS — C01 Malignant neoplasm of base of tongue: Secondary | ICD-10-CM

## 2020-07-08 DIAGNOSIS — R293 Abnormal posture: Secondary | ICD-10-CM | POA: Insufficient documentation

## 2020-07-08 DIAGNOSIS — R1310 Dysphagia, unspecified: Secondary | ICD-10-CM | POA: Insufficient documentation

## 2020-07-08 DIAGNOSIS — C770 Secondary and unspecified malignant neoplasm of lymph nodes of head, face and neck: Secondary | ICD-10-CM | POA: Diagnosis not present

## 2020-07-08 DIAGNOSIS — Z87891 Personal history of nicotine dependence: Secondary | ICD-10-CM | POA: Diagnosis not present

## 2020-07-08 NOTE — Therapy (Signed)
[place], [place], [address] Phone: [phone]   Fax:  [phone]  Patient Details  Name: Jose Harvey MRN: [id number] Date of Birth: 11-27-1960 Referring Provider:  Eppie Gibson, MD  Encounter Date: 07/08/2020   Allyson Sabal [facility] 07/08/2020, 10:00 AM  [place], [place], [address] Phone: [phone]   Fax:  [phone]

## 2020-07-08 NOTE — Progress Notes (Signed)
Oncology Nurse Navigator Documentation  I met with Jose Harvey before and after his appointments with Garald Balding SLP and Allyson Sabal PT today. I reinforced previous education and assisted him to change his PEG tube dressing for the first time today after his procedure yesterday. We also flushed the PEG as well. He is aware that he needs to complete the dressing change and flush daily. He provided correct procedure for each activity. He knows to call me if he has any questions or concerns.  Harlow Asa RN, BSN, OCN Head & Neck Oncology Nurse [place] [facility] Phone # [phone]  Fax # [phone]

## 2020-07-08 NOTE — Telephone Encounter (Signed)
Scheduled per los, patient has been called and notified of upcoming appointments. 

## 2020-07-08 NOTE — Therapy (Signed)
[place], [place], [address] Phone: [phone]   Fax:  [phone]  Physical Therapy Evaluation  Patient Details  Name: Jose Harvey MRN: [id number] Date of Birth: 1961/02/15 Referring Provider (PT): Reita May Date: 07/08/2020   PT End of Session - 07/08/20 0837    Visit Number 1    Number of Visits 2    Date for PT Re-Evaluation 09/02/20    PT Start Time 0935    PT Stop Time 0957    PT Time Calculation (min) 22 min    Activity Tolerance Patient tolerated treatment well    Behavior During Therapy [facility] for tasks assessed/performed           Past Medical History:  Diagnosis Date  . Adrenal adenoma, left 03/2017   noted on CT  . Allergic rhinitis, cause unspecified   . Anxiety state, unspecified   . Aortic atherosclerosis ([place]) 03/2017   noted on CT  . BPV (benign positional vertigo)   . COPD (chronic obstructive pulmonary disease) with emphysema (HCC)    mild followed by [facility]   . Coronary artery disease   . Depressive disorder, not elsewhere classified   . Dyspnea    history of  . ED (erectile dysfunction)   . Gallbladder polyp 03/2016   noted on CT  . Hepatic hemangioma 03/2017   noted on CT  . History of kidney stones    hx of years ago   . Indigestion   . 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 Mixed hyperlipidemia   . Prostate cancer ([place])   . 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 Type I (juvenile type) diabetes mellitus without mention of complication, not stated as uncontrolled    type I - followed by Dr Buddy Duty   . Vesicoureteral reflux     Past Surgical History:  Procedure Laterality Date  . CHOLECYSTECTOMY N/A 05/17/2016   Procedure: LAPAROSCOPIC CHOLECYSTECTOMY;  Surgeon: Clovis Riley, MD;  Location: [facility];  Service: General;  Laterality: N/A;  . cyst  removed from throat     . dental surgeries     . IR GASTROSTOMY TUBE MOD SED  07/07/2020  . IR IMAGING GUIDED PORT INSERTION  07/07/2020  . LYMPHADENECTOMY Bilateral 07/02/2017   Procedure: BILATERAL LYMPHADENECTOMY;  Surgeon: Lucas Mallow, MD;  [facility]: [facility];  Service: Urology;  Laterality: Bilateral;  . PROSTATE BIOPSY    . ROBOT ASSISTED LAPAROSCOPIC RADICAL PROSTATECTOMY N/A 07/02/2017   Procedure: XI ROBOTIC ASSISTED LAPAROSCOPIC RADICAL PROSTATECTOMY;  Surgeon: Lucas Mallow, MD;  [facility];  Service: Urology;  Laterality: N/A;  . TONSILLECTOMY    . VIDEO ASSISTED THORACOSCOPY (VATS)/ LOBECTOMY Right 05/08/2019   Procedure: Right VIDEO ASSISTED THORACOSCOPY with   MIDDLE LOBECTOMY and Enbloc portion of Upper Lobe with Node dissection, Intercostal Nerve Block;  Surgeon: Melrose Nakayama, MD;  Location: [facility];  Service: Thoracic;  Laterality: Right;  . WISDOM TOOTH EXTRACTION      There were no vitals filed for this visit.    Subjective Assessment - 07/08/20 0825    Subjective I feel good.    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 No/denies    Pain Score 0-No pain              [facility] PT Assessment - 07/08/20 0001      Assessment   Medical Diagnosis SCC of tongue    Referring Provider (PT) Squire    Onset Date/Surgical Date 05/03/20    Hand Dominance Right    Prior Therapy none       Precautions   Precautions Other (comment)    Precaution Comments active cancer      Restrictions   Weight Bearing Restrictions No      Balance Screen   Has the patient fallen in the past 6 months No    Has the patient had a decrease in activity level because of a fear of falling?  No    Is the patient reluctant to leave their home because of a fear of falling?  No      Home Environment   Living Environment Private residence    Living Arrangements Alone    Available Help at Discharge Family    Type of Coffee      Prior Function   Level of [place] Retired    Leisure pt does not currently exercise      Cognition   Overall Cognitive Status Within Functional Limits for tasks assessed      Functional Tests   Functional tests Sit to Stand      Sit to Stand   Comments 14 reps in 30 seconds avg is 15      Posture/Postural Control   Posture/Postural Control Postural limitations    Postural Limitations Rounded Shoulders;Forward head      ROM / Strength   AROM / PROM / Strength AROM      AROM   Overall AROM Comments shoulder ROM WFL    AROM Assessment Site Cervical    Cervical Flexion WFL    Cervical Extension WFL    Cervical - Right Side Bend WFL    Cervical - Left Side Bend WFL    Cervical - Right Rotation WFL    Cervical - Left Rotation [facility]      Ambulation/Gait   Ambulation/Gait Yes    Ambulation/Gait Assistance 7: Independent    Ambulation Distance (Feet) 10 Feet    Gait Pattern Within Functional Limits    Ambulation Surface Level             LYMPHEDEMA/ONCOLOGY QUESTIONNAIRE - 07/08/20 0001      Lymphedema Assessments   Lymphedema Assessments Head and Neck      Head and Neck   4 cm superior to sternal notch around neck 41.3 cm    6 cm superior to sternal notch around neck 41 cm    8 cm superior to sternal notch around neck 41.6 cm                   Objective measurements completed on examination: See above  findings.               PT Education - 07/08/20 [phone]    Education Details Educated  pt about signs and symptoms of lymphedema as well as anatomy and physiology of lymphatic system. Educated pt in importance of staying as active as possible throughout treatment to decrease fatigue as well as head and neck ROM exercises to decrease loss of ROM. Will see pt after completion of radiation to reassess ROM and assess for lymphedema to determine therapy needs at that time.    Person(s) Educated Patient    Methods Explanation;Handout    Comprehension Verbalized understanding               PT Long Term Goals - 07/08/20 0837      PT LONG TERM GOAL #1   Title Pt will return to baseline ROM measurements and not demonstrate any signs or symptoms of lymphedema.    Time 8    Period Weeks    Status New    Target Date 09/02/20              Head and Neck Clinic Goals - 07/08/20 [id number]      Patient will be able to verbalize understanding of a home exercise program for cervical range of motion, posture, and walking.    Time 1    Period Days    Status Achieved      Patient will be able to verbalize understanding of proper sitting and standing posture.    Time 1    Period Days    Status Achieved      Patient will be able to verbalize understanding of lymphedema risk and availability of treatment for this condition.    Time 1    Period Days    Status Achieved              Plan - 07/08/20 0926    Clinical Impression Statement Pt presents to head and neck clinic with recently diagnosed squamous cell carcinoma of tongue, p16+. Pt's neck and shoulder ROM are [facility] and he was able to complete 14 sit to stands in 30 sec which is average for his age. Educated pt about signs and symptoms of lymphedema as well as anatomy and physiology of lymphatic system. Educated pt in importance of staying as active as possible throughout treatment to decrease fatigue as well as head  and neck ROM exercises to decrease loss of ROM. Will see pt after completion of radiation to reassess ROM and assess for lymphedema to determine therapy needs at that time.    Personal Factors and Comorbidities Comorbidity 3+    Comorbidities previous hx of prostate cancer, non Hodgkins lymphoma, diabetes    Stability/Clinical Decision Making Stable/Uncomplicated    Clinical Decision Making Low    Rehab Potential Good    PT Frequency --   eval and 1 f/u visit   PT Duration 8 weeks    PT Treatment/Interventions ADLs/Self Care Home Management;Patient/family education;Therapeutic exercise    PT Next Visit Plan reassess baselines    PT Home Exercise Plan head and neck ROM exercises, walking program    Consulted and Agree with Plan of Care Patient           Patient will benefit from skilled therapeutic intervention in order to improve the following deficits and impairments:  Postural dysfunction,Decreased knowledge of precautions  Visit Diagnosis: Abnormal posture  Malignant neoplasm of base of tongue ([place])     Problem List Patient Active Problem List   Diagnosis Date Noted  . Counseling regarding advance care planning and goals of care 06/29/2020  . Cancer of base of  tongue ([place]) 06/11/2020  . S/P thoracotomy 05/08/2019  . Prostate cancer ([place]) 07/02/2017  . Malignant neoplasm of prostate ([place]) 06/06/2017  . DOE (dyspnea on exertion) 07/28/2014  . COPD (chronic obstructive pulmonary disease) ([place]) 01/30/2014  . Throat fullness 01/30/2014  . Paroxysmal nocturnal dyspnea 11/17/2013  . Hyperlipidemia 11/17/2013  . Insulin dependent diabetes mellitus 11/17/2013  . Essential hypertension 11/17/2013    Allyson Sabal [facility] 07/08/2020, 10:01 AM  [place], [place], [address] Phone: [phone]   Fax:  [phone]  Name: Jose Harvey MRN: [id number] Date of Birth: [birth year]  Manus Gunning, PT 07/08/20 10:01 AM

## 2020-07-09 ENCOUNTER — Ambulatory Visit: Payer: BC Managed Care – PPO

## 2020-07-09 ENCOUNTER — Ambulatory Visit
Admission: RE | Admit: 2020-07-09 | Discharge: 2020-07-09 | Disposition: A | Discharge status: Home / Self Care | Payer: BC Managed Care – PPO | Source: Ambulatory Visit | Attending: Radiation Oncology | Admitting: Radiation Oncology

## 2020-07-09 DIAGNOSIS — C770 Secondary and unspecified malignant neoplasm of lymph nodes of head, face and neck: Secondary | ICD-10-CM | POA: Diagnosis not present

## 2020-07-09 DIAGNOSIS — C01 Malignant neoplasm of base of tongue: Secondary | ICD-10-CM | POA: Diagnosis not present

## 2020-07-09 DIAGNOSIS — Z87891 Personal history of nicotine dependence: Secondary | ICD-10-CM | POA: Diagnosis not present

## 2020-07-09 NOTE — Therapy (Signed)
[place] [address] [place], [place], [address] Phone: [phone]   Fax:  [phone]  Speech Language Pathology Evaluation  Patient Details  Name: Jose Harvey MRN: [id number] Date of Birth: 09/30/1960 Referring Provider (SLP): Eppie Gibson, MD   Encounter Date: 07/08/2020   End of Session - 07/09/20 0100    Visit Number 1    Number of Visits 7    Date for SLP Re-Evaluation 10/06/20    SLP Start Time 0845    SLP Stop Time  0916    SLP Time Calculation (min) 31 min    Activity Tolerance Patient tolerated treatment well           Past Medical History:  Diagnosis Date  . Adrenal adenoma, left 03/2017   noted on CT  . Allergic rhinitis, cause unspecified   . Anxiety state, unspecified   . Aortic atherosclerosis ([place]) 03/2017   noted on CT  . BPV (benign positional vertigo)   . COPD (chronic obstructive pulmonary disease) with emphysema (HCC)    mild followed by [facility]   . Coronary artery disease   . Depressive disorder, not elsewhere classified   . Dyspnea    history of  . ED (erectile dysfunction)   . Gallbladder polyp 03/2016   noted on CT  . Hepatic hemangioma 03/2017   noted on CT  . History of kidney stones    hx of years ago   . Indigestion   . 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 Mixed hyperlipidemia   . Prostate cancer ([place])   . 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 Type I (juvenile type) diabetes mellitus without mention of complication, not stated as uncontrolled    type I - followed by Dr Buddy Duty   . Vesicoureteral reflux     Past Surgical History:  Procedure Laterality Date  . CHOLECYSTECTOMY N/A 05/17/2016   Procedure: LAPAROSCOPIC CHOLECYSTECTOMY;  Surgeon: Clovis Riley, MD;  [facility];  Service: General;  Laterality: N/A;  . cyst removed from throat     . dental  surgeries     . IR GASTROSTOMY TUBE MOD SED  07/07/2020  . IR IMAGING GUIDED PORT INSERTION  07/07/2020  . LYMPHADENECTOMY Bilateral 07/02/2017   Procedure: BILATERAL LYMPHADENECTOMY;  Surgeon: Lucas Mallow, MD;  [facility];  Service: Urology;  Laterality: Bilateral;  . PROSTATE BIOPSY    . ROBOT ASSISTED LAPAROSCOPIC RADICAL PROSTATECTOMY N/A 07/02/2017   Procedure: XI ROBOTIC ASSISTED LAPAROSCOPIC RADICAL PROSTATECTOMY;  Surgeon: Lucas Mallow, MD;  [facility];  Service: Urology;  Laterality: N/A;  . TONSILLECTOMY    . VIDEO ASSISTED THORACOSCOPY (VATS)/ LOBECTOMY Right 05/08/2019   Procedure: Right VIDEO ASSISTED THORACOSCOPY with   MIDDLE LOBECTOMY and Enbloc portion of Upper Lobe with Node dissection, Intercostal Nerve Block;  Surgeon: Melrose Nakayama, MD;  Location: [facility];  Service: Thoracic;  Laterality: Right;  . WISDOM TOOTH EXTRACTION      There were no vitals filed for this visit.   Subjective Assessment - 07/09/20 0054    Subjective Pt denies any oral or pharyngeal s/sx dysphagia    Currently in Pain? No/denies              SLP Evaluation [facility] - 07/09/20 [id number]      SLP Visit Information   SLP Received On 07/08/20    Referring Provider (SLP) Eppie Gibson,  MD    Onset Date November 2021    Medical Diagnosis base of tongue cancer      Subjective   Patient/Family Stated Goal "My swallowing to remain functional."      General Information   HPI CT in late november revealed rt base of tongue massiwth level 2 nodal involvemet. a needle core biopsy on 05-2220 confirmed SCCA. Plan is for 35 fx to base of tongue and bil neck with weekly carbo/taxol chemo. PEG placed 07-07-20.      Prior Functional Status   Cognitive/Linguistic Baseline Within functional limits      Cognition   Overall Cognitive Status Within Functional Limits for tasks assessed      Auditory Comprehension   Overall Auditory Comprehension Appears within functional limits for tasks  assessed      Oral Motor/Sensory Function   Overall Oral Motor/Sensory Function Appears within functional limits for tasks assessed      Motor Speech   Overall Motor Speech Appears within functional limits for tasks assessed           Pt currently tolerates  dys III items (mech soft) and thin liquids. POs: Pt ate and drank without overt s/s aspiration. Thyroid elevation appeared adequate, and swallows appeared timely. Pt's swallow deemed WNL at this time.   Because data states the risk for dysphagia during and after radiation treatment is high due to undergoing radiation tx, SLP taught pt about the possibility of reduced/limited ability for PO intake during rad tx. SLP encouraged pt to continue swallowing POs as far into rad tx as possible, even ingesting POs and/or completing HEP shortly after administration of pain meds. Among other modifications for days when pt cannot functionally swallow, SLP talked about performing only non-swallowing tasks on the handout/HEP, and then adding swallowing tasks back in when it becomes possible to do so.  SLP educated pt re: changes to swallowing musculature after rad tx, and why adherence to dysphagia HEP provided today and PO consumption was necessary to inhibit muscular disuse atrophy and to reduce muscle fibrosis following rad tx. Pt demonstrated understanding of these things to SLP.    SLP then developed a HEP for pt and pt was instructed how to perform exercises involving lingual, vocal, and pharyngeal strengthening. SLP performed each exercise and pt return demonstrated each exercise. SLP ensured pt performance was correct prior to moving on to next exercise. Pt was instructed to complete this program 2-3 times a day, 6-7 days/week until 6 months after his last rad tx, then x2 a week after that.                  SLP Education - 07/09/20 0059    Education Details late effects head/neck radiation on swallow function, HEP procedure     Person(s) Educated Patient    Methods Explanation;Demonstration;Verbal cues;Handout    Comprehension Verbalized understanding;Returned demonstration;Verbal cues required;Need further instruction            SLP Short Term Goals - 07/09/20 0102      SLP SHORT TERM GOAL #1   Title Pt will complete HEP with rare min A over two sessions    Time 3    Period --   visits, for all STGs   Status New      SLP SHORT TERM GOAL #2   Title Pt will tell SLP rationale for HEP completion over two sessions    Time 3    Status New      SLP  SHORT TERM GOAL #3   Title Pt will tell SLP how a food journal can hasten/facilitate return to more normalized diet    Time 3    Status New            SLP Long Term Goals - 07/09/20 0103      SLP LONG TERM GOAL #1   Title Pt will complete HEP with modified independence over 3 visits    Time 4    Status New      SLP LONG TERM GOAL #2   Title Pt will tell SLP 3 overt s/s aspiration PNA with modified independence    Time 3    Status New      SLP LONG TERM GOAL #3   Title Pt will tell SLP when to decr frequency of HEP    Time 6    Status New            Plan - 07/09/20 0100    Clinical Impression Statement Pt presents today with WNL/WFL swallowing ability with dys III (mech soft) and thin liquids.  No overt s/sx aspiration PNA reported or observed today. Data suggests that as pts progress through rad or chemorad therapy that their swallowing ability will decrease. 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 Therefore, SLP developed an indivdualized exercise program to mitigate/eliminate pt's muscle fibrosis following and as a result of, pt's rad tx. Skilled ST would be beneficial to the pt in order to regularly assess pt's safety with POs and/or need for instrumental swallow assessment, as well as to assess accurate completion of swallowing HEP.    Speech Therapy Frequency --   once  every approx 4 weeks   Duration --   7 total visits   Treatment/Interventions Aspiration precaution training;Pharyngeal strengthening exercises;Diet toleration management by SLP;Trials of upgraded texture/liquids;Internal/external aids;Patient/family education;Compensatory strategies;SLP instruction and feedback    Potential to Achieve Goals Good    SLP Home Exercise Plan provided today    Consulted and Agree with Plan of Care Patient           Patient will benefit from skilled therapeutic intervention in order to improve the following deficits and impairments:   Dysphagia, unspecified type    Problem List Patient Active Problem List   Diagnosis Date Noted  . Counseling regarding advance care planning and goals of care 06/29/2020  . Cancer of base of tongue ([place]) 06/11/2020  . S/P thoracotomy 05/08/2019  . Prostate cancer ([place]) 07/02/2017  . Malignant neoplasm of prostate ([place]) 06/06/2017  . DOE (dyspnea on exertion) 07/28/2014  . COPD (chronic obstructive pulmonary disease) ([place]) 01/30/2014  . Throat fullness 01/30/2014  . Paroxysmal nocturnal dyspnea 11/17/2013  . Hyperlipidemia 11/17/2013  . Insulin dependent diabetes mellitus 11/17/2013  . Essential hypertension 11/17/2013    [facility], [place]  07/09/2020, 1:04 AM  [facility] [address] [place], [place], [address] Phone: [phone]   Fax:  [phone]  Name: Jose Harvey MRN: [id number] Date of Birth: 24-Feb-1961

## 2020-07-09 NOTE — Patient Instructions (Signed)
SWALLOWING EXERCISES Do these until 6 months after your last day of radiation, then 2-3 times per week afterwards  1. Effortful Swallows - Press your tongue against the roof of your mouth for 3 seconds, then squeeze the muscles in your neck while you swallow your saliva or a sip of water - Repeat 10-15 times, 2-3 times a day, and use whenever you eat or drink  2. Masako Swallow - swallow with your tongue sticking out - Stick tongue out past your lips and gently bite tongue with your teeth - Swallow, while holding your tongue with your teeth - Repeat 10-15 times, 2-3 times a day *use a wet spoon if your mouth gets dry*  3. Pitch Raise - Repeat "he", once per second in as high of a pitch as you can - Repeat 20 times, 2-3 times a day  4. Shaker Exercise - head lift - Lie flat on your back in your bed or on a couch without pillows - Raise your head and look at your feet - KEEP YOUR SHOULDERS DOWN - HOLD FOR 45-60 SECONDS, then lower your head back down - Repeat 3 times, 2-3 times a day  5. Mendelsohn Maneuver - "half swallow" exercise - Start to swallow, and keep your Adam's apple up by squeezing hard with the muscles of the throat - Hold the squeeze for 5-7 seconds and then relax - Repeat 10-15 times, 2-3 times a day *use a wet spoon if your mouth gets dry*        6. "Super Swallow"  - Take a breath and hold it  - Bear down (like pushing your bowels)  - Swallow then IMMEDIATELY cough  - Repeat 10 times, 2-3 times a day

## 2020-07-12 ENCOUNTER — Other Ambulatory Visit: Payer: Self-pay | Admitting: Radiation Oncology

## 2020-07-12 ENCOUNTER — Ambulatory Visit: Payer: BC Managed Care – PPO

## 2020-07-12 ENCOUNTER — Ambulatory Visit
Admission: RE | Admit: 2020-07-12 | Discharge: 2020-07-12 | Disposition: A | Discharge status: Home / Self Care | Payer: BC Managed Care – PPO | Source: Ambulatory Visit | Attending: Radiation Oncology | Admitting: Radiation Oncology

## 2020-07-12 ENCOUNTER — Other Ambulatory Visit: Payer: Self-pay

## 2020-07-12 DIAGNOSIS — C01 Malignant neoplasm of base of tongue: Secondary | ICD-10-CM

## 2020-07-12 DIAGNOSIS — Z87891 Personal history of nicotine dependence: Secondary | ICD-10-CM | POA: Diagnosis not present

## 2020-07-12 DIAGNOSIS — C770 Secondary and unspecified malignant neoplasm of lymph nodes of head, face and neck: Secondary | ICD-10-CM | POA: Diagnosis not present

## 2020-07-12 MED ORDER — LIDOCAINE VISCOUS HCL 2 % MT SOLN
OROMUCOSAL | 4 refills | Status: DC
Start: 2020-07-12 — End: 2020-12-16

## 2020-07-12 NOTE — Progress Notes (Signed)
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 Pt is accompanied today by his sister. The pt reports that he is doing well overall.  The pt reports that he was a long-term smoker and due to that he has been a participant in a lung cancer screening program. While doing routine monitoring small lung nodules were first noticed in 2017. They did not feel that action needed to be taken at that time so they simply continued monitoring him. A significant change was noticed within the last 6 months. Pt denies any fevers, chills, night sweats, SOB during this time. Pt had a Thoracoscopy with Middle Lobectomy with Dr. Koleen Nimrod on 05/08/2019. He currently feels well and denies any of the aforementioned symptoms except an increase in SOB, noticed after surgery.   Pt quit smoking about 10 years ago. He was diagnosed with COPD in 2018 and given an inhaler. He did not notice a difference with the inhaler so he has since stopped using it. Pt has not had any issues with vertigo in sometime and his diabetes has been well controlled. Pt has a history of Prostate Cancer and had it resected in January of 2019. Pt also had a cholecystectomy, which they decided on due to activity seen on 2017 PET scan. He denies any issues resulting  from his gallbladder removal. Diabetes and heart disease runs in his family. His father had prostate cancer and heart disease as well.   Pt will return to work in Spring. There is dust and floating debris at his job but he is not around it very often. Pt has stayed up to date with his annual flu vaccine as well as his pneumonia vaccines.   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 Masslike area of ground-glass straddles the minor fissure with septal thickening and fissural retraction. While the lesion has increased in size minimally from 09/30/2018, there is clear enlargement from baseline examination on 02/16/2016. Therefore, lesion is characterized as worrisome for adenocarcinoma, Lung-RADS 4B, suspicious. Additional imaging evaluation or consultation with Pulmonology or Thoracic Surgery recommended. These results will be called to the ordering clinician or representative by the Radiologist Assistant, and communication documented in the PACS or zVision Dashboard. 2. Liver hemangiomas and left adrenal adenoma, best characterized on 03/03/2016. 3. Aortic atherosclerosis (ICD10-170.0). Coronary artery calcification."  Pt has had PET/CT ([id number]) completed on 02/25/2016 with results revealing "Dominant 8 mm right lower lobe pulmonary nodule shows no significant FDG uptake. Other scattered less than 5 mm bilateral pulmonary nodules also show no FDG uptake but are too small to characterize by PET. Recommend followup by  chest CT in 6 months. (please use the following order, "CT CHEST LCS NODULE FOLLOW-UP W/O CM") Right middle lobe ground-glass opacity  shows low-grade FDG uptake, and differential diagnosis includes inflammatory or infectious processes and low-grade adenocarcinoma. Recommend followup by chest CT in 6 months.  Multiple low-attenuation liver masses show no hypermetabolic activity compared to normal hepatic parenchyma. These remain indeterminate and cannot be characterized without IV contrast. Abdomen MRI without and with contrast recommended for further evaluation. 2.5 cm left adrenal mass shows mild FDG uptake, and remains indeterminate. Differential diagnosis includes atypical adenoma, metastasis, pheochromocytoma, or less likely adrenal cortical carcinoma. This can also be further evaluated with abdomen MRI without and with contrast."  Most recent lab results (05/12/2019) of CBC is as follows: all values are WNL except for RBC at 3.62, Hgb at 11.2, HCT at 34.2.  On review of systems, pt reports increased SOB and denies testicular pain/swelling, leg swelling, fevers, chills, night sweats and any other symptoms.   On PMHx the pt reports COPD with emphysema, Benign Positional Vertigo, Diabetes, Prostate Cancer, Prostatectomy (2019), Cholecystectomy. On Social Hx the pt reports he is a former smoker who quit 10 years ago On Family Hx the pt reports that his father had Prostate Cancer and Heart Disease, additional family history of Heart Disease and Diabetes.  INTERVAL HISTORY:   Jose Harvey is a wonderful 60 y.o. male who is here for evaluation and management of newly diagnosed head and neck squamous cell carcinoma. [name] last visit with [place] was on 07/05/2020. The pt reports that he is doing well overall. He is here for C1D8.  The pt reports no new symptoms or concerns. He notes no issues with his Port at this time, nor soreness in his mouth. He notes his carpal tunnel is his hands is still occurring. The pt has had no decreased appetite, not having to use the tube at all. The neck swelling has decreased recently within the  last week.   The pt notes that he has been staying physically active and trying to live as normal to his baseline as possible. He notes he has been taking his Vitamin B complex the last week.  Lab results today 07/13/2020 of CBC w/diff and CMP is as follows: all values are WNL except for Glucose 170. 07/13/2020 Magnesium of 1.8.  On review of systems, pt reports change in taste and denies n/v/d, changes in bowel habits, tingling in toes, leg swelling, fevers, chills, skin rashes and any other symptoms.  MEDICAL HISTORY:  Past Medical History:  Diagnosis Date  . Adrenal adenoma, left 03/2017   noted on CT  . Allergic rhinitis, cause unspecified   . Anxiety state, unspecified   . Aortic atherosclerosis ([place]) 03/2017   noted on CT  . BPV (benign positional vertigo)   . COPD (chronic obstructive pulmonary disease) with emphysema (HCC)    mild followed by [facility]   . Coronary artery disease   . Depressive disorder, not elsewhere classified   . Dyspnea    history of  . ED (erectile dysfunction)   . Gallbladder polyp 03/2016   noted on CT  . Hepatic hemangioma 03/2017   noted on CT  . History of kidney stones    hx of years ago   . Indigestion   . 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 Mixed hyperlipidemia   . Prostate cancer ([place])   . 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  Type I (juvenile type) diabetes mellitus without mention of complication, not stated as uncontrolled    type I - followed by Dr Buddy Duty   . Vesicoureteral reflux     SURGICAL HISTORY: Past Surgical History:  Procedure Laterality Date  . CHOLECYSTECTOMY N/A 05/17/2016   Procedure: LAPAROSCOPIC CHOLECYSTECTOMY;  Surgeon: Clovis Riley, MD;  Location: [facility];  Service: General;  Laterality: N/A;  . cyst removed from throat     . dental surgeries     . IR GASTROSTOMY TUBE MOD SED  07/07/2020  . IR IMAGING GUIDED  PORT INSERTION  07/07/2020  . LYMPHADENECTOMY Bilateral 07/02/2017   Procedure: BILATERAL LYMPHADENECTOMY;  Surgeon: Lucas Mallow, MD;  Location: [facility];  Service: Urology;  Laterality: Bilateral;  . PROSTATE BIOPSY    . ROBOT ASSISTED LAPAROSCOPIC RADICAL PROSTATECTOMY N/A 07/02/2017   Procedure: XI ROBOTIC ASSISTED LAPAROSCOPIC RADICAL PROSTATECTOMY;  Surgeon: Lucas Mallow, MD;  Location: [facility];  Service: Urology;  Laterality: N/A;  . TONSILLECTOMY    . VIDEO ASSISTED THORACOSCOPY (VATS)/ LOBECTOMY Right 05/08/2019   Procedure: Right VIDEO ASSISTED THORACOSCOPY with   MIDDLE LOBECTOMY and Enbloc portion of Upper Lobe with Node dissection, Intercostal Nerve Block;  Surgeon: Melrose Nakayama, MD;  Location: [facility];  Service: Thoracic;  Laterality: Right;  . WISDOM TOOTH EXTRACTION      SOCIAL HISTORY: Social History   Socioeconomic History  . Marital status: Divorced    Spouse name: Not on file  . Number of children: 2  . Years of education: Not on file  . 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 Smoking status: Former Smoker    Packs/day: 1.50    Years: 27.00    Pack years: 40.50    Types: Cigarettes    Quit date: 06/05/2008    Years since quitting: 12.1  . Smokeless tobacco: Former Systems developer    Types: Snuff    Quit date: 2015  Vaping Use  . Vaping Use: Never used  Substance and Sexual Activity  . Alcohol use: Yes    Alcohol/week: 6.0 standard drinks    Types: 6 Cans of beer per week    Comment: occasional 6pk per week  . Drug use: No  . Sexual activity: Not Currently  Other Topics Concern  . Not on file  Social History Narrative   Patient divorced. Patient has two grown daughters but neither lives locally.   Social Determinants of Health   Financial Resource Strain: Low Risk   . Difficulty of Paying Living Expenses: Not very hard  Food Insecurity: No Food Insecurity  . Worried About Ship broker in the Last Year: Never true  . Ran Out of Food in the Last Year: Never true  Transportation Needs: No Transportation Needs  . Lack of Transportation (Medical): No  . Lack of Transportation (Non-Medical): No  Physical Activity: Not on file  Stress: No Stress Concern Present  . Feeling of Stress : Only a little  Social Connections: Unknown  . Frequency of Communication with Friends and Family: More than three times a week  . Frequency of Social Gatherings with Friends and Family: More than three times a week  . Attends Religious Services: More than 4 times per year  . Active Member of Clubs or Organizations: Yes  . Attends Archivist Meetings: More than 4 times per year  . Marital Status: Not on  file  Intimate Partner Violence: Not on file    FAMILY HISTORY: Family History  Problem Relation Age of Onset  . Diabetes type I Father   . Heart attack Father   . Hypertension Father   . Emphysema Father   . CAD Father   . Colon cancer Maternal Grandfather   . Emphysema Paternal Grandfather   . Diabetes type I Paternal Grandfather   . Kidney disease Neg Hx   . Liver disease Neg Hx   . Breast cancer Neg Hx   . Prostate cancer Neg Hx     ALLERGIES:  is allergic to crestor [rosuvastatin], lipitor [atorvastatin], other, and zocor [simvastatin].  MEDICATIONS:  Current Outpatient Medications  Medication Sig Dispense Refill  . lidocaine (XYLOCAINE) 2 % solution Patient: Mix 1part 2% viscous lidocaine, 1part H20. Swish & swallow 24mL of diluted mixture, 46min before meals and at bedtime, up to QID 200 mL 4  . aspirin 81 MG tablet Take 1 tablet (81 mg total) by mouth daily. 30 tablet   . cetirizine (ZYRTEC) 10 MG tablet Take 10 mg by mouth daily.    . chlorhexidine (PERIDEX) 0.12 % solution Use as directed 15 mLs in the mouth or throat daily.    . Coenzyme Q10 (COQ10) 100 MG CAPS Take 100 mg by mouth daily.    . Continuous Blood Gluc Sensor (FREESTYLE LIBRE 14 DAY SENSOR)  MISC AS DIRECTED CHANGE SENSOR EVERY 14 DAYS 28    . dexamethasone (DECADRON) 4 MG tablet Take 2 tablets (8 mg total) by mouth daily. Start the day after chemotherapy for 2 days. 30 tablet 1  . ibuprofen (ADVIL) 200 MG tablet Take 400-800 mg by mouth every 6 (six) hours as needed for moderate pain. (Patient not taking: Reported on 06/17/2020)    . Insulin Human (INSULIN PUMP) SOLN Inject into the skin continuous. NOVOLOG 100 UNIT/ML VIAL    . lidocaine-prilocaine (EMLA) cream Apply to affected area once 30 g 3  . lisinopril (PRINIVIL,ZESTRIL) 5 MG tablet Take 5 mg by mouth daily.     Marland Kitchen LORazepam (ATIVAN) 0.5 MG tablet Take 1 tablet (0.5 mg total) by mouth every 6 (six) hours as needed (Nausea or vomiting). 30 tablet 0  . Multiple Vitamins-Minerals (ICAPS AREDS 2) CAPS Take 1 capsule by mouth daily.    Marland Kitchen NOVOLOG 100 UNIT/ML injection Averages about 30 units per day via insulin pump    . ondansetron (ZOFRAN) 8 MG tablet Take 1 tablet (8 mg total) by mouth 2 (two) times daily as needed for refractory nausea / vomiting. Start on day 3 after chemo. 30 tablet 1  . pantoprazole (PROTONIX) 40 MG tablet Take 40 mg by mouth daily.    . prochlorperazine (COMPAZINE) 10 MG tablet Take 1 tablet (10 mg total) by mouth every 6 (six) hours as needed (Nausea or vomiting). 30 tablet 1  . rosuvastatin (CRESTOR) 5 MG tablet Take 1 tablet (5 mg total) by mouth daily. 90 tablet 3  . sertraline (ZOLOFT) 100 MG tablet Take 50 mg by mouth daily.  0   No current facility-administered medications for this visit.    REVIEW OF SYSTEMS:   10 Point review of Systems was done is negative except as noted above.   PHYSICAL EXAMINATION: ECOG PERFORMANCE STATUS: 0 - Asymptomatic  VS reviewed Exam was given in bed in infusion.  GENERAL:alert, in no acute distress and comfortable SKIN: no acute rashes, no significant lesions EYES: conjunctiva are pink and non-injected, sclera anicteric OROPHARYNX: MMM,  no exudates, no  oropharyngeal erythema or ulceration NECK: supple, no JVD LYMPH:  no palpable lymphadenopathy in the cervical, axillary or inguinal regions LUNGS: clear to auscultation b/l with normal respiratory effort HEART: regular rate & rhythm ABDOMEN:  normoactive bowel sounds , non tender, not distended. Extremity: no pedal edema PSYCH: alert & oriented x 3 with fluent speech NEURO: no focal motor/sensory deficits  LABORATORY DATA:  I have reviewed the data as listed  . CBC Latest Ref Rng & Units 07/13/2020 07/07/2020 07/05/2020  WBC 4.0 - 10.5 K/uL 5.9 10.1 8.0  Hemoglobin 13.0 - 17.0 g/dL 13.1 13.4 13.9  Hematocrit 39.0 - 52.0 % 39.0 40.0 42.9  Platelets 150 - 400 K/uL 208 229 252    CMP Latest Ref Rng & Units 07/13/2020 07/05/2020 06/17/2020  Glucose 70 - 99 mg/dL 170(H) 236(H) -  BUN 8 - 23 mg/dL 19 19 24(H)  Creatinine 0.61 - 1.24 mg/dL 0.91 1.05 1.08  Sodium 135 - 145 mmol/L 137 139 -  Potassium 3.5 - 5.1 mmol/L 4.2 4.4 -  Chloride 98 - 111 mmol/L 105 104 -  CO2 22 - 32 mmol/L 26 27 -  Calcium 8.9 - 10.3 mg/dL 9.0 9.2 -  Total Protein 6.5 - 8.1 g/dL 6.8 7.3 -  Total Bilirubin 0.3 - 1.2 mg/dL 0.3 0.4 -  Alkaline Phos 38 - 126 U/L 74 75 -  AST 15 - 41 U/L 17 24 -  ALT 0 - 44 U/L 24 39 -   05/26/2020 Right Cervical LN Bx ([id number]):   05/08/2019 Surgical Pathology [phone]):    05/08/2019 Surgical Pathology [phone]):    RADIOGRAPHIC STUDIES: I have personally reviewed the radiological images as listed and agreed with the findings in the report. IR Gastrostomy Tube  Result Date: 07/07/2020 INDICATION: 60 year old male with recently diagnosed head neck cancer requiring percutaneous enteric access. EXAM: PERC PLACEMENT GASTROSTOMY MEDICATIONS: 2 mg Ancef, intravenous; Antibiotics were administered within 1 hour of the procedure. ANESTHESIA/SEDATION: Versed 3 mg IV; Fentanyl 0 mcg IV Moderate Sedation Time:  20 The patient was continuously monitored during the procedure  by the interventional radiology nurse under my direct supervision. CONTRAST:  45mL OMNIPAQUE IOHEXOL 300 MG/ML SOLN - administered into the gastric lumen. FLUOROSCOPY TIME:  Fluoroscopy Time: 0 minutes 42 seconds (8 mGy). COMPLICATIONS: None immediate. PROCEDURE: Informed written consent was obtained from the patient after a thorough discussion of the procedural risks, benefits and alternatives. All questions were addressed. Maximal Sterile Barrier Technique was utilized including caps, mask, sterile gowns, sterile gloves, sterile drape, hand hygiene and skin antiseptic. A timeout was performed prior to the initiation of the procedure. The patient was placed on the procedure table in the supine position. Pre-procedure abdominal film confirmed visualization of the transverse colon. An angled 5-French catheter was passed through the nares into the stomach. The patient was prepped and draped in usual sterile fashion. The stomach was insufflated with air via the indwelling nasogastric tube. Under fluoroscopy, a puncture site was selected and local analgesia achieved with 1% lidocaine infiltrated subcutaneously. Under fluoroscopic guidance, a gastropexy needle was passed into the stomach and the T-bar suture was released. Entry into the stomach was confirmed with fluoroscopy, aspiration of air, and injection of contrast material. This was repeated with an additional gastropexy suture (for a total of 2 fasteners). At the center of these gastropexy sutures, a dermatotomy was performed. An 18 gauge needle was passed into the stomach at the site of this dermatotomy, and position within the gastric  lumen again confirmed under fluoroscopy using aspiration of air and contrast injection. An Amplatz guidewire was passed through this needle and intraluminal placement within the stomach was confirmed by fluoroscopy. The needle was removed. Over the guidewire, the percutaneous tract was dilated using a 10 mm non-compliant balloon.  The balloon was deflated, then pushed into the gastric lumen followed in concert by the 20 Fr gastrostomy tube. The retention balloon of the percutaneous gastrostomy tube was inflated with 20 mL of sterile water. The tube was withdrawn until the retention balloon was at the edge of the gastric lumen. The external bumper was brought to the abdominal wall. Contrast was injected through the gastrostomy tube, confirming intraluminal positioning. The patient tolerated the procedure well without any immediate post-procedural complications. IMPRESSION: Technically successful placement of 20 Fr gastrostomy tube. Absorbable gastropexy sutures do not require additional appointment for removal. Ruthann Cancer, MD [facility] Radiology Electronically Signed   By: Ruthann Cancer MD   On: 07/07/2020 12:12   IR IMAGING GUIDED PORT INSERTION  Result Date: 07/07/2020 INDICATION: 60 year old male with newly diagnosed head neck cancer requiring central venous access for chemotherapy. EXAM: IMPLANTED PORT A CATH PLACEMENT WITH ULTRASOUND AND FLUOROSCOPIC GUIDANCE COMPARISON:  None. MEDICATIONS: Ancef 2 gm IV; The antibiotic was administered within an appropriate time interval prior to skin puncture. ANESTHESIA/SEDATION: Moderate (conscious) sedation was employed during this procedure. A total of Versed 3 mg and Fentanyl 100 mcg was administered intravenously. Moderate Sedation Time: 17 minutes. [name] level of consciousness and vital signs were monitored continuously by radiology nursing throughout the procedure under my direct supervision. CONTRAST:  None FLUOROSCOPY TIME:  0 minutes, 6 seconds (1 mGy) COMPLICATIONS: None immediate. PROCEDURE: The procedure, risks, benefits, and alternatives were explained to the patient. Questions regarding the procedure were encouraged and answered. The patient understands and consents to the procedure. The right neck and chest were prepped  with chlorhexidine in a sterile fashion, and a sterile drape was applied covering the operative field. Maximum barrier sterile technique with sterile gowns and gloves were used for the procedure. A timeout was performed prior to the initiation of the procedure. Ultrasound was used to examine the jugular vein which was compressible and free of internal echoes. A skin marker was used to demarcate the planned venotomy and port pocket incision sites. Local anesthesia was provided to these sites and the subcutaneous tunnel track with 1% lidocaine with 1:100,000 epinephrine. A small incision was created at the jugular access site and blunt dissection was performed of the subcutaneous tissues. Under real time ultrasound guidance, the jugular vein was accessed with a 21 ga micropuncture needle and an 0.018" wire was inserted to the superior vena cava. A 5 Fr micopuncture set was then used, through which a 0.035" Rosen wire was passed under fluoroscopic guidance into the inferior vena cava. An 8 Fr dilator was then placed over the wire. A subcutaneous port pocket was then created along the upper chest wall utilizing a combination of sharp and blunt dissection. The pocket was irrigated with sterile saline, packed with gauze, and observed for hemorrhage. A single lumen "ISP" sized power injectable port was chosen for placement. The 8 Fr catheter was tunneled from the port pocket site to the venotomy incision. The port was placed in the pocket. The external catheter was trimmed to appropriate length. The dilator was exchanged for an 8 Fr peel-away sheath under fluoroscopic guidance. The catheter was then placed through the sheath and the sheath  was removed. Final catheter positioning was confirmed and documented with a fluoroscopic spot radiograph. The port was accessed with a Huber needle, aspirated, and flushed with heparinized saline. The deep dermal layer of the port pocket incision was closed with interrupted 3-0 Vicryl  suture. Dermabond was then placed over the port pocket and neck incisions. The patient tolerated the procedure well without immediate post procedural complication. FINDINGS: After catheter placement, the tip lies within the cavoatrial junction. The catheter aspirates and flushes normally and is ready for immediate use. IMPRESSION: Successful placement of a power injectable Port-A-Cath via the right internal jugular vein. The catheter is ready for immediate use. 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 Plan -I discussed with the patient his pathology results in details showing new diagnosis of squamous cell carcinoma of the base of the tongue metastatic to right cervical lymph nodes. -We discussed that tumor is p16 positive suggests its at least partly mediated by HPV.  This by itself does suggest a better prognosis however increasingly there is data showing a smoking history of more than 30 pack years does adversely affect this favorable prognosis. -We discussed NCCN guidelines and treatment options.  He has non bulky right level 2 lymphadenopathy.  Treatment options presented were consideration of TORS surgery with lymph node dissection if deemed technically feasible by ENT.  He understands that there might be a role for adjuvant radiation or chemoradiation if he does have high risk features on pathology after surgery. -Alternative option if surgery is not technically feasible would be to consider concurrent  chemotherapy and radiation.  Patient would not be the best candidate for high-dose concurrent cisplatin given his diabetic nephropathy, significant pulmonary and cardiac comorbidities and an eye to avoid immune suppression in the setting of known extranodal marginal zone lymphoma.  If this option is to be exercised might need to go with carboplatin/Taxol weekly. -Patient has not had a detailed discussion with [facility] to discuss the technicalities and feasibility and pros and cons of primary surgical resection of his tumor and would like to pursue this. -He has been referred to see Dr. Silvio Clayman at [facility] to discuss TORS. -All the patient's and his wife's concerns were addressed in details.    -Counseled extensively on continued smoking cessation and avoiding alcohol use.    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 Masslike area of ground-glass straddles the minor fissure with septal thickening and fissural retraction. While the lesion has increased in size minimally from 09/30/2018, there is clear enlargement from baseline examination on 02/16/2016. Therefore, lesion is characterized as worrisome for adenocarcinoma,  Lung-RADS 4B, suspicious. -02/25/2016 PET/CT ([id number]) revealed "Dominant 8 mm right lower lobe pulmonary nodule shows no significant FDG uptake. Other scattered less than 5 mm bilateral pulmonary nodules also show no FDG uptake but are too small to characterize by PET. Right middle lobe ground-glass opacity  shows low-grade FDG uptake, and differential diagnosis includes inflammatory or infectious processes and low-grade adenocarcinoma. Multiple low-attenuation liver masses show no hypermetabolic activity compared to normal hepatic parenchyma. These remain indeterminate and cannot be characterized without IV contrast. Abdomen MRI without and with contrast recommended for further evaluation. 2.5 cm left adrenal mass shows mild FDG uptake, and remains indeterminate. Differential diagnosis includes atypical adenoma, metastasis, pheochromocytoma, or less likely adrenal cortical carcinoma."  PLAN: -Discussed pt labwork today, 07/13/2020; blood counts, chemistries, Magnesium normal. -Advised pt that skin irritation and sore throat can occur due to treatment.  -Advised pt he is tolerating the treatment very well.  -Continue Vitamin B Complex daily. -Advised pt the swelling in neck is less prominent and more reduced since last examination. -Advise pt to eat well, drink 48-64 oz water daily, and stay active. -Recommend pt take Multivitamin daily. -Will see back in 2 weeks w labs.   FOLLOW UP: Please schedule next 3 cycles of weekly carboplatin Taxol with port flush and labs. MD visit in 2 weeks   The total time spent in the appointment was 30 minutes and more than 50% was on counseling and direct patient cares, ordering and management of chemotherapy and co-ordination of cares.  All of the patient's questions were answered with apparent satisfaction. The patient knows to call the clinic with any problems, questions or concerns.   Sullivan Lone MD [place] AAHIVMS [facility] Hematology/Oncology Physician [facility]  (Office):       [phone] (Work cell):  [phone] (Fax):           [phone]  07/12/2020 12:22 PM  I, Reinaldo Raddle, am acting as scribe for Dr. Sullivan Lone, MD.     .I have reviewed the above documentation for accuracy and completeness, and I agree with the above. Brunetta Genera MD

## 2020-07-13 ENCOUNTER — Encounter: Payer: BC Managed Care – PPO | Admitting: Nutrition

## 2020-07-13 ENCOUNTER — Other Ambulatory Visit: Payer: BC Managed Care – PPO

## 2020-07-13 ENCOUNTER — Inpatient Hospital Stay (HOSPITAL_BASED_OUTPATIENT_CLINIC_OR_DEPARTMENT_OTHER): Payer: BC Managed Care – PPO | Admitting: Hematology

## 2020-07-13 ENCOUNTER — Other Ambulatory Visit (HOSPITAL_COMMUNITY): Payer: Self-pay | Admitting: Radiology

## 2020-07-13 ENCOUNTER — Inpatient Hospital Stay: Payer: BC Managed Care – PPO

## 2020-07-13 ENCOUNTER — Ambulatory Visit (HOSPITAL_COMMUNITY)
Admission: RE | Admit: 2020-07-13 | Discharge: 2020-07-13 | Disposition: A | Discharge status: Home / Self Care | Payer: BC Managed Care – PPO | Source: Ambulatory Visit | Attending: Radiology | Admitting: Radiology

## 2020-07-13 ENCOUNTER — Ambulatory Visit: Payer: BC Managed Care – PPO | Admitting: Hematology

## 2020-07-13 ENCOUNTER — Other Ambulatory Visit: Payer: Self-pay

## 2020-07-13 ENCOUNTER — Ambulatory Visit: Payer: BC Managed Care – PPO

## 2020-07-13 ENCOUNTER — Encounter (HOSPITAL_COMMUNITY): Payer: Self-pay

## 2020-07-13 ENCOUNTER — Other Ambulatory Visit: Payer: Self-pay | Admitting: *Deleted

## 2020-07-13 ENCOUNTER — Inpatient Hospital Stay: Payer: BC Managed Care – PPO | Admitting: Nutrition

## 2020-07-13 ENCOUNTER — Ambulatory Visit
Admission: RE | Admit: 2020-07-13 | Discharge: 2020-07-13 | Disposition: A | Discharge status: Home / Self Care | Payer: BC Managed Care – PPO | Source: Ambulatory Visit | Attending: Radiation Oncology | Admitting: Radiation Oncology

## 2020-07-13 DIAGNOSIS — I700 Atherosclerosis of aorta: Secondary | ICD-10-CM | POA: Diagnosis not present

## 2020-07-13 DIAGNOSIS — Z5111 Encounter for antineoplastic chemotherapy: Secondary | ICD-10-CM | POA: Diagnosis not present

## 2020-07-13 DIAGNOSIS — D1809 Hemangioma of other sites: Secondary | ICD-10-CM | POA: Diagnosis not present

## 2020-07-13 DIAGNOSIS — C770 Secondary and unspecified malignant neoplasm of lymph nodes of head, face and neck: Secondary | ICD-10-CM | POA: Diagnosis not present

## 2020-07-13 DIAGNOSIS — Z431 Encounter for attention to gastrostomy: Secondary | ICD-10-CM

## 2020-07-13 DIAGNOSIS — Z7189 Other specified counseling: Secondary | ICD-10-CM

## 2020-07-13 DIAGNOSIS — C01 Malignant neoplasm of base of tongue: Secondary | ICD-10-CM

## 2020-07-13 DIAGNOSIS — Z87891 Personal history of nicotine dependence: Secondary | ICD-10-CM | POA: Diagnosis not present

## 2020-07-13 DIAGNOSIS — Z833 Family history of diabetes mellitus: Secondary | ICD-10-CM | POA: Diagnosis not present

## 2020-07-13 DIAGNOSIS — J449 Chronic obstructive pulmonary disease, unspecified: Secondary | ICD-10-CM | POA: Diagnosis not present

## 2020-07-13 DIAGNOSIS — Z7289 Other problems related to lifestyle: Secondary | ICD-10-CM | POA: Diagnosis not present

## 2020-07-13 DIAGNOSIS — D3502 Benign neoplasm of left adrenal gland: Secondary | ICD-10-CM | POA: Diagnosis not present

## 2020-07-13 DIAGNOSIS — Z8249 Family history of ischemic heart disease and other diseases of the circulatory system: Secondary | ICD-10-CM | POA: Diagnosis not present

## 2020-07-13 DIAGNOSIS — Z79899 Other long term (current) drug therapy: Secondary | ICD-10-CM | POA: Diagnosis not present

## 2020-07-13 DIAGNOSIS — C884 Extranodal marginal zone B-cell lymphoma of mucosa-associated lymphoid tissue [MALT-lymphoma]: Secondary | ICD-10-CM | POA: Diagnosis not present

## 2020-07-13 DIAGNOSIS — R0602 Shortness of breath: Secondary | ICD-10-CM | POA: Diagnosis not present

## 2020-07-13 DIAGNOSIS — Z95828 Presence of other vascular implants and grafts: Secondary | ICD-10-CM

## 2020-07-13 DIAGNOSIS — Z8546 Personal history of malignant neoplasm of prostate: Secondary | ICD-10-CM | POA: Diagnosis not present

## 2020-07-13 DIAGNOSIS — Z836 Family history of other diseases of the respiratory system: Secondary | ICD-10-CM | POA: Diagnosis not present

## 2020-07-13 LAB — CBC WITH DIFFERENTIAL (CANCER CENTER ONLY)
Abs Immature Granulocytes: 0.05 10*3/uL (ref 0.00–0.07)
Basophils Absolute: 0.1 10*3/uL (ref 0.0–0.1)
Basophils Relative: 1 %
Eosinophils Absolute: 0.3 10*3/uL (ref 0.0–0.5)
Eosinophils Relative: 5 %
HCT: 39 % (ref 39.0–52.0)
Hemoglobin: 13.1 g/dL (ref 13.0–17.0)
Immature Granulocytes: 1 %
Lymphocytes Relative: 12 %
Lymphs Abs: 0.7 10*3/uL (ref 0.7–4.0)
MCH: 30.4 pg (ref 26.0–34.0)
MCHC: 33.6 g/dL (ref 30.0–36.0)
MCV: 90.5 fL (ref 80.0–100.0)
Monocytes Absolute: 0.5 10*3/uL (ref 0.1–1.0)
Monocytes Relative: 8 %
Neutro Abs: 4.4 10*3/uL (ref 1.7–7.7)
Neutrophils Relative %: 73 %
Platelet Count: 208 10*3/uL (ref 150–400)
RBC: 4.31 MIL/uL (ref 4.22–5.81)
RDW: 12.1 % (ref 11.5–15.5)
WBC Count: 5.9 10*3/uL (ref 4.0–10.5)
nRBC: 0 % (ref 0.0–0.2)

## 2020-07-13 LAB — CMP (CANCER CENTER ONLY)
ALT: 24 U/L (ref 0–44)
AST: 17 U/L (ref 15–41)
Albumin: 3.5 g/dL (ref 3.5–5.0)
Alkaline Phosphatase: 74 U/L (ref 38–126)
Anion gap: 6 (ref 5–15)
BUN: 19 mg/dL (ref 8–23)
CO2: 26 mmol/L (ref 22–32)
Calcium: 9 mg/dL (ref 8.9–10.3)
Chloride: 105 mmol/L (ref 98–111)
Creatinine: 0.91 mg/dL (ref 0.61–1.24)
GFR, Estimated: >60 mL/min (ref >60)
Glucose, Bld: 170 mg/dL — ABNORMAL HIGH (ref 70–99)
Potassium: 4.2 mmol/L (ref 3.5–5.1)
Sodium: 137 mmol/L (ref 135–145)
Total Bilirubin: 0.3 mg/dL (ref 0.3–1.2)
Total Protein: 6.8 g/dL (ref 6.5–8.1)

## 2020-07-13 LAB — MAGNESIUM: Magnesium: 1.8 mg/dL (ref 1.7–2.4)

## 2020-07-13 MED ORDER — SODIUM CHLORIDE 0.9 % IV SOLN
50.0000 mg/m2 | Freq: Once | INTRAVENOUS | Status: AC
  Administered 2020-07-13: 102 mg via INTRAVENOUS
  Filled 2020-07-13: qty 17

## 2020-07-13 MED ORDER — SODIUM CHLORIDE 0.9% FLUSH
10.0000 mL | INTRAVENOUS | Status: DC | PRN
  Administered 2020-07-13: 10 mL
  Filled 2020-07-13: qty 10

## 2020-07-13 MED ORDER — SODIUM CHLORIDE 0.9% FLUSH
10.0000 mL | Freq: Once | INTRAVENOUS | Status: AC
  Administered 2020-07-13: 10 mL
  Filled 2020-07-13: qty 10

## 2020-07-13 MED ORDER — SODIUM CHLORIDE 0.9 % IV SOLN
249.4000 mg | Freq: Once | INTRAVENOUS | Status: AC
Start: 2020-07-13 — End: 2020-07-13
  Administered 2020-07-13: 250 mg via INTRAVENOUS
  Filled 2020-07-13: qty 25

## 2020-07-13 MED ORDER — HEPARIN SOD (PORK) LOCK FLUSH 100 UNIT/ML IV SOLN
500.0000 [IU] | Freq: Once | INTRAVENOUS | Status: AC | PRN
  Administered 2020-07-13: 500 [IU]
  Filled 2020-07-13: qty 5

## 2020-07-13 MED ORDER — DIPHENHYDRAMINE HCL 50 MG/ML IJ SOLN
50.0000 mg | Freq: Once | INTRAMUSCULAR | Status: AC
  Administered 2020-07-13: 50 mg via INTRAVENOUS

## 2020-07-13 MED ORDER — PALONOSETRON HCL INJECTION 0.25 MG/5ML
INTRAVENOUS | Status: AC
  Filled 2020-07-13: qty 5

## 2020-07-13 MED ORDER — SODIUM CHLORIDE 0.9 % IV SOLN
20.0000 mg | Freq: Once | INTRAVENOUS | Status: AC
  Administered 2020-07-13: 20 mg via INTRAVENOUS
  Filled 2020-07-13: qty 20

## 2020-07-13 MED ORDER — FAMOTIDINE IN NACL 20-0.9 MG/50ML-% IV SOLN
20.0000 mg | Freq: Once | INTRAVENOUS | Status: AC
  Administered 2020-07-13: 20 mg via INTRAVENOUS

## 2020-07-13 MED ORDER — PALONOSETRON HCL INJECTION 0.25 MG/5ML
0.2500 mg | Freq: Once | INTRAVENOUS | Status: AC
  Administered 2020-07-13: 0.25 mg via INTRAVENOUS

## 2020-07-13 MED ORDER — FAMOTIDINE IN NACL 20-0.9 MG/50ML-% IV SOLN
INTRAVENOUS | Status: AC
  Filled 2020-07-13: qty 50

## 2020-07-13 MED ORDER — DIPHENHYDRAMINE HCL 50 MG/ML IJ SOLN
INTRAMUSCULAR | Status: AC
  Filled 2020-07-13: qty 1

## 2020-07-13 MED ORDER — SODIUM CHLORIDE 0.9 % IV SOLN
Freq: Once | INTRAVENOUS | Status: AC
  Filled 2020-07-13: qty 250

## 2020-07-13 NOTE — Progress Notes (Signed)
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 Patient status post feeding tube placement on February 2.  PEG education completed.  Patient reports he does not have a clamp on his tube nor does the syringe remain in the tip of the tube.  It pops out. Weight decreased slightly to 179.4 pounds on February 7 down from 182 pounds January 13. Patient reports increase in blood sugars around days of chemotherapy. Reports he is eating well. Denies nausea, vomiting, diarrhea, and constipation. He has no problems chewing or swallowing at this time. Concerned with carbohydrate content of Osmolite 1.5 and hyperglycemia.  Estimated nutrition needs: 2500-2900 cal, 125-145 g protein, 2.5 L fluid.  Nutrition diagnosis: Predicted suboptimal energy intake continues.  Intervention: Patient and I worked together to try to fit standard 60 cc syringe into the end of his feeding tube.  It does indeed pop off.  He has to clamp the tube with his fingers of one hand and also hold the syringe in the tube using his other hand therefore he is unable to give his tube feedings without additional help.  Even with holding the syringe in place the water/formula leaks out of the top.  IR has agreed to see patient today. Continue strategies for adequate calorie and protein intake. Begin 1 carton Osmolite 1.5 to assess tolerance. Patient to monitor blood sugars and consult primary MD with hyperglycemia and insulin management.  Monitoring, evaluation, goals: Patient will tolerate adequate calories and protein for weight maintenance.  Next visit: Tuesday, February 15 with Joli.  **Disclaimer: This note was dictated with voice recognition software. Similar sounding words can inadvertently be transcribed and this note may contain transcription errors which may not have been corrected upon publication of note.**

## 2020-07-13 NOTE — Progress Notes (Signed)
Pt sent over to assess G-tube, having trouble with flushes and connection.  Tube is balloon retention G-tube. Added Jose Harvey adapter which will allow the pt to effectively 'clamp' the tube. Should not apply external clamp to tubing as this may damage to balloon channel. Also advised should try to inject anything down the Balloon port marked 'BAL' Pt feels comfortable using this. He will be able to flush using [place]y syringe as well as regular use for TF.  Ascencion Dike PA-C Interventional Radiology 07/13/2020 2:59 PM

## 2020-07-13 NOTE — Patient Instructions (Signed)
[facility] Discharge Instructions for Patients Receiving Chemotherapy  Today you received the following chemotherapy agents: paclitaxel and carboplatin.  To help prevent nausea and vomiting after your treatment, we encourage you to take your nausea medication as directed.   If you develop nausea and vomiting that is not controlled by your nausea medication, call the clinic.   BELOW ARE SYMPTOMS THAT SHOULD BE REPORTED IMMEDIATELY:  *FEVER GREATER THAN 100.5 F  *CHILLS WITH OR WITHOUT FEVER  NAUSEA AND VOMITING THAT IS NOT CONTROLLED WITH YOUR NAUSEA MEDICATION  *UNUSUAL SHORTNESS OF BREATH  *UNUSUAL BRUISING OR BLEEDING  TENDERNESS IN MOUTH AND THROAT WITH OR WITHOUT PRESENCE OF ULCERS  *URINARY PROBLEMS  *BOWEL PROBLEMS  UNUSUAL RASH Items with * indicate a potential emergency and should be followed up as soon as possible.  Feel free to call the clinic should you have any questions or concerns. The clinic phone number is [phone].  Please show the CHEMO ALERT CARD at check-in to the Emergency Department and triage nurse.   

## 2020-07-14 ENCOUNTER — Other Ambulatory Visit: Payer: Self-pay

## 2020-07-14 ENCOUNTER — Ambulatory Visit: Payer: BC Managed Care – PPO

## 2020-07-14 ENCOUNTER — Ambulatory Visit: Payer: BC Managed Care – PPO | Admitting: Hematology

## 2020-07-14 ENCOUNTER — Ambulatory Visit
Admission: RE | Admit: 2020-07-14 | Discharge: 2020-07-14 | Disposition: A | Discharge status: Home / Self Care | Payer: BC Managed Care – PPO | Source: Ambulatory Visit | Attending: Radiation Oncology | Admitting: Radiation Oncology

## 2020-07-14 DIAGNOSIS — Z87891 Personal history of nicotine dependence: Secondary | ICD-10-CM | POA: Diagnosis not present

## 2020-07-14 DIAGNOSIS — C770 Secondary and unspecified malignant neoplasm of lymph nodes of head, face and neck: Secondary | ICD-10-CM | POA: Diagnosis not present

## 2020-07-14 DIAGNOSIS — C01 Malignant neoplasm of base of tongue: Secondary | ICD-10-CM | POA: Diagnosis not present

## 2020-07-15 ENCOUNTER — Ambulatory Visit: Payer: BC Managed Care – PPO

## 2020-07-15 ENCOUNTER — Other Ambulatory Visit: Payer: Self-pay

## 2020-07-15 ENCOUNTER — Ambulatory Visit
Admission: RE | Admit: 2020-07-15 | Discharge: 2020-07-15 | Disposition: A | Discharge status: Home / Self Care | Payer: BC Managed Care – PPO | Source: Ambulatory Visit | Attending: Radiation Oncology | Admitting: Radiation Oncology

## 2020-07-15 DIAGNOSIS — Z87891 Personal history of nicotine dependence: Secondary | ICD-10-CM | POA: Diagnosis not present

## 2020-07-15 DIAGNOSIS — C01 Malignant neoplasm of base of tongue: Secondary | ICD-10-CM | POA: Diagnosis not present

## 2020-07-15 DIAGNOSIS — C770 Secondary and unspecified malignant neoplasm of lymph nodes of head, face and neck: Secondary | ICD-10-CM | POA: Diagnosis not present

## 2020-07-16 ENCOUNTER — Ambulatory Visit
Admission: RE | Admit: 2020-07-16 | Discharge: 2020-07-16 | Disposition: A | Discharge status: Home / Self Care | Payer: BC Managed Care – PPO | Source: Ambulatory Visit | Attending: Radiation Oncology | Admitting: Radiation Oncology

## 2020-07-16 ENCOUNTER — Ambulatory Visit: Payer: BC Managed Care – PPO

## 2020-07-16 DIAGNOSIS — C01 Malignant neoplasm of base of tongue: Secondary | ICD-10-CM | POA: Diagnosis not present

## 2020-07-16 DIAGNOSIS — Z87891 Personal history of nicotine dependence: Secondary | ICD-10-CM | POA: Diagnosis not present

## 2020-07-16 DIAGNOSIS — C770 Secondary and unspecified malignant neoplasm of lymph nodes of head, face and neck: Secondary | ICD-10-CM | POA: Diagnosis not present

## 2020-07-19 ENCOUNTER — Other Ambulatory Visit: Payer: BC Managed Care – PPO

## 2020-07-19 ENCOUNTER — Ambulatory Visit: Payer: BC Managed Care – PPO

## 2020-07-19 ENCOUNTER — Ambulatory Visit
Admission: RE | Admit: 2020-07-19 | Discharge: 2020-07-19 | Disposition: A | Discharge status: Home / Self Care | Payer: BC Managed Care – PPO | Source: Ambulatory Visit | Attending: Radiation Oncology | Admitting: Radiation Oncology

## 2020-07-19 DIAGNOSIS — Z87891 Personal history of nicotine dependence: Secondary | ICD-10-CM | POA: Diagnosis not present

## 2020-07-19 DIAGNOSIS — C01 Malignant neoplasm of base of tongue: Secondary | ICD-10-CM | POA: Diagnosis not present

## 2020-07-19 DIAGNOSIS — C770 Secondary and unspecified malignant neoplasm of lymph nodes of head, face and neck: Secondary | ICD-10-CM | POA: Diagnosis not present

## 2020-07-20 ENCOUNTER — Ambulatory Visit: Payer: BC Managed Care – PPO

## 2020-07-20 ENCOUNTER — Other Ambulatory Visit: Payer: Self-pay

## 2020-07-20 ENCOUNTER — Other Ambulatory Visit: Payer: Self-pay | Admitting: *Deleted

## 2020-07-20 ENCOUNTER — Inpatient Hospital Stay: Payer: BC Managed Care – PPO

## 2020-07-20 ENCOUNTER — Ambulatory Visit
Admission: RE | Admit: 2020-07-20 | Discharge: 2020-07-20 | Disposition: A | Discharge status: Home / Self Care | Payer: BC Managed Care – PPO | Source: Ambulatory Visit | Attending: Radiation Oncology | Admitting: Radiation Oncology

## 2020-07-20 VITALS — BP 114/78 | HR 79 | Temp 97.7°F | Resp 18 | Ht 71.0 in | Wt 178.5 lb

## 2020-07-20 DIAGNOSIS — I700 Atherosclerosis of aorta: Secondary | ICD-10-CM | POA: Diagnosis not present

## 2020-07-20 DIAGNOSIS — Z7189 Other specified counseling: Secondary | ICD-10-CM

## 2020-07-20 DIAGNOSIS — D3502 Benign neoplasm of left adrenal gland: Secondary | ICD-10-CM | POA: Diagnosis not present

## 2020-07-20 DIAGNOSIS — Z5111 Encounter for antineoplastic chemotherapy: Secondary | ICD-10-CM | POA: Diagnosis not present

## 2020-07-20 DIAGNOSIS — Z833 Family history of diabetes mellitus: Secondary | ICD-10-CM | POA: Diagnosis not present

## 2020-07-20 DIAGNOSIS — Z8546 Personal history of malignant neoplasm of prostate: Secondary | ICD-10-CM | POA: Diagnosis not present

## 2020-07-20 DIAGNOSIS — Z79899 Other long term (current) drug therapy: Secondary | ICD-10-CM | POA: Diagnosis not present

## 2020-07-20 DIAGNOSIS — C01 Malignant neoplasm of base of tongue: Secondary | ICD-10-CM

## 2020-07-20 DIAGNOSIS — C884 Extranodal marginal zone B-cell lymphoma of mucosa-associated lymphoid tissue [MALT-lymphoma]: Secondary | ICD-10-CM | POA: Diagnosis not present

## 2020-07-20 DIAGNOSIS — Z7289 Other problems related to lifestyle: Secondary | ICD-10-CM | POA: Diagnosis not present

## 2020-07-20 DIAGNOSIS — Z8249 Family history of ischemic heart disease and other diseases of the circulatory system: Secondary | ICD-10-CM | POA: Diagnosis not present

## 2020-07-20 DIAGNOSIS — D1809 Hemangioma of other sites: Secondary | ICD-10-CM | POA: Diagnosis not present

## 2020-07-20 DIAGNOSIS — C770 Secondary and unspecified malignant neoplasm of lymph nodes of head, face and neck: Secondary | ICD-10-CM | POA: Diagnosis not present

## 2020-07-20 DIAGNOSIS — J449 Chronic obstructive pulmonary disease, unspecified: Secondary | ICD-10-CM | POA: Diagnosis not present

## 2020-07-20 DIAGNOSIS — Z836 Family history of other diseases of the respiratory system: Secondary | ICD-10-CM | POA: Diagnosis not present

## 2020-07-20 DIAGNOSIS — Z95828 Presence of other vascular implants and grafts: Secondary | ICD-10-CM

## 2020-07-20 DIAGNOSIS — Z87891 Personal history of nicotine dependence: Secondary | ICD-10-CM | POA: Diagnosis not present

## 2020-07-20 DIAGNOSIS — R0602 Shortness of breath: Secondary | ICD-10-CM | POA: Diagnosis not present

## 2020-07-20 LAB — CMP (CANCER CENTER ONLY)
ALT: 29 U/L (ref 0–44)
AST: 20 U/L (ref 15–41)
Albumin: 3.6 g/dL (ref 3.5–5.0)
Alkaline Phosphatase: 77 U/L (ref 38–126)
Anion gap: 7 (ref 5–15)
BUN: 21 mg/dL (ref 8–23)
CO2: 26 mmol/L (ref 22–32)
Calcium: 9 mg/dL (ref 8.9–10.3)
Chloride: 99 mmol/L (ref 98–111)
Creatinine: 0.98 mg/dL (ref 0.61–1.24)
GFR, Estimated: >60 mL/min (ref >60)
Glucose, Bld: 258 mg/dL — ABNORMAL HIGH (ref 70–99)
Potassium: 4.6 mmol/L (ref 3.5–5.1)
Sodium: 132 mmol/L — ABNORMAL LOW (ref 135–145)
Total Bilirubin: 0.3 mg/dL (ref 0.3–1.2)
Total Protein: 6.9 g/dL (ref 6.5–8.1)

## 2020-07-20 LAB — LACTATE DEHYDROGENASE: LDH: 162 U/L (ref 98–192)

## 2020-07-20 LAB — CBC WITH DIFFERENTIAL (CANCER CENTER ONLY)
Abs Immature Granulocytes: 0.04 10*3/uL (ref 0.00–0.07)
Basophils Absolute: 0.1 10*3/uL (ref 0.0–0.1)
Basophils Relative: 1 %
Eosinophils Absolute: 0.2 10*3/uL (ref 0.0–0.5)
Eosinophils Relative: 2 %
HCT: 38.2 % — ABNORMAL LOW (ref 39.0–52.0)
Hemoglobin: 13.2 g/dL (ref 13.0–17.0)
Immature Granulocytes: 1 %
Lymphocytes Relative: 6 %
Lymphs Abs: 0.4 10*3/uL — ABNORMAL LOW (ref 0.7–4.0)
MCH: 30.3 pg (ref 26.0–34.0)
MCHC: 34.6 g/dL (ref 30.0–36.0)
MCV: 87.6 fL (ref 80.0–100.0)
Monocytes Absolute: 0.6 10*3/uL (ref 0.1–1.0)
Monocytes Relative: 9 %
Neutro Abs: 5.4 10*3/uL (ref 1.7–7.7)
Neutrophils Relative %: 81 %
Platelet Count: 235 10*3/uL (ref 150–400)
RBC: 4.36 MIL/uL (ref 4.22–5.81)
RDW: 12 % (ref 11.5–15.5)
WBC Count: 6.7 10*3/uL (ref 4.0–10.5)
nRBC: 0 % (ref 0.0–0.2)

## 2020-07-20 LAB — MAGNESIUM: Magnesium: 2 mg/dL (ref 1.7–2.4)

## 2020-07-20 MED ORDER — PALONOSETRON HCL INJECTION 0.25 MG/5ML
0.2500 mg | Freq: Once | INTRAVENOUS | Status: AC
  Administered 2020-07-20: 0.25 mg via INTRAVENOUS

## 2020-07-20 MED ORDER — FAMOTIDINE IN NACL 20-0.9 MG/50ML-% IV SOLN
INTRAVENOUS | Status: AC
  Filled 2020-07-20: qty 50

## 2020-07-20 MED ORDER — PALONOSETRON HCL INJECTION 0.25 MG/5ML
INTRAVENOUS | Status: AC
  Filled 2020-07-20: qty 5

## 2020-07-20 MED ORDER — FAMOTIDINE IN NACL 20-0.9 MG/50ML-% IV SOLN
20.0000 mg | Freq: Once | INTRAVENOUS | Status: AC
  Administered 2020-07-20: 20 mg via INTRAVENOUS

## 2020-07-20 MED ORDER — SODIUM CHLORIDE 0.9 % IV SOLN
50.0000 mg/m2 | Freq: Once | INTRAVENOUS | Status: AC
  Administered 2020-07-20: 102 mg via INTRAVENOUS
  Filled 2020-07-20: qty 17

## 2020-07-20 MED ORDER — DIPHENHYDRAMINE HCL 50 MG/ML IJ SOLN
50.0000 mg | Freq: Once | INTRAMUSCULAR | Status: AC
  Administered 2020-07-20: 50 mg via INTRAVENOUS

## 2020-07-20 MED ORDER — DIPHENHYDRAMINE HCL 50 MG/ML IJ SOLN
INTRAMUSCULAR | Status: AC
  Filled 2020-07-20: qty 1

## 2020-07-20 MED ORDER — GLUCERNA 1.5 CAL PO LIQD: ORAL | 5 refills | Status: DC

## 2020-07-20 MED ORDER — SODIUM CHLORIDE 0.9 % IV SOLN
20.0000 mg | Freq: Once | INTRAVENOUS | Status: AC
  Administered 2020-07-20: 20 mg via INTRAVENOUS
  Filled 2020-07-20: qty 20

## 2020-07-20 MED ORDER — CARBOPLATIN CHEMO INJECTION 450 MG/45ML
235.2000 mg | Freq: Once | INTRAVENOUS | Status: AC
  Administered 2020-07-20: 240 mg via INTRAVENOUS
  Filled 2020-07-20: qty 24

## 2020-07-20 MED ORDER — SODIUM CHLORIDE 0.9% FLUSH
10.0000 mL | INTRAVENOUS | Status: DC | PRN
  Administered 2020-07-20: 10 mL
  Filled 2020-07-20: qty 10

## 2020-07-20 MED ORDER — SODIUM CHLORIDE 0.9% FLUSH
10.0000 mL | Freq: Once | INTRAVENOUS | Status: AC
  Administered 2020-07-20: 10 mL
  Filled 2020-07-20: qty 10

## 2020-07-20 MED ORDER — SODIUM CHLORIDE 0.9 % IV SOLN
Freq: Once | INTRAVENOUS | Status: AC
  Filled 2020-07-20: qty 250

## 2020-07-20 MED ORDER — HEPARIN SOD (PORK) LOCK FLUSH 100 UNIT/ML IV SOLN
500.0000 [IU] | Freq: Once | INTRAVENOUS | Status: AC | PRN
  Administered 2020-07-20: 500 [IU]
  Filled 2020-07-20: qty 5

## 2020-07-20 NOTE — Progress Notes (Addendum)
Nutrition Follow-up:  Patient receiving chemotherapy and radiation for tongue cancer.  PEG placed on 2/2.    Met with patient following radiation.  Patient report that his appetite has dropped off and he is not eating much orally due to no taste.  Drinking fluids at least 48 oz per day.  Eating peanut butter crackers, milk, yogurt, fruit.    Patient reports that on Friday started using feeding tube.  Gave 1 bottle of osmolite via tube and reports blood glucose was elevated.  Saturday gave 3-4 equate max protein shakes (150 calorie, lower in carbohydrate) via feeding tube.  Sunday more equate shakes and osmolite 3-4 and Monday took 3 of osmolite.  Blood glucose readings yesterday mostly in upper 200s (248, 239, 249, 294) and 331.  Patient with past medical history of DM on insulin pump.    Medications: reviewed  Labs: reviewed from 2/8  Anthropometrics:   Weight decreased to 172 lb on 2/14 per [facility]  179 lb 4 oz on 2/7 182 lb on 1/13   Estimated Energy Needs  Kcals: 2500-2900 Protein: 125-145 g Fluid: 2.5 L  NUTRITION DIAGNOSIS: Predicted suboptimal energy intake continues   INTERVENTION:  Recommend glucerna 1.5 with history of DM and elevated blood glucose on standard formula.  Goal rate of 7 cartons per day via bolus/syringe feeding if unable to take po diet (8am, noon and 4pm 2 carton, 1 carton at 8pm).  Patient over the next week will give 4 cartons (1 carton QID) of tube feeding per day at least and add more if oral intake declines. Will need additional 230m TID of water orally or via tube to better meet hydration needs.  Written instructions given to patient.  Tube feeding will provide 2492 calories, 137 g protein, 2465mfree water (from formula and water flush). Meets 100% of calorie needs RD to contact [employer] regarding supplying tube feeding and supplies.  Patient does not have preference. Contact information provided    MONITORING, EVALUATION, GOAL:  weight trends, intake, tube feeding tolerance   NEXT VISIT: Feb 23 (Wednesday) with BaJerrell [place]. AlZenia ResidesRDBethelLDCrownsvilleegistered Dietitian [phone])

## 2020-07-20 NOTE — Patient Instructions (Signed)
[facility] Discharge Instructions for Patients Receiving Chemotherapy  Today you received the following chemotherapy agents: Paclitaxel (Taxol) and Carboplatin  To help prevent nausea and vomiting after your treatment, we encourage you to take your nausea medication  as prescribed.    If you develop nausea and vomiting that is not controlled by your nausea medication, call the clinic.   BELOW ARE SYMPTOMS THAT SHOULD BE REPORTED IMMEDIATELY:  *FEVER GREATER THAN 100.5 F  *CHILLS WITH OR WITHOUT FEVER  NAUSEA AND VOMITING THAT IS NOT CONTROLLED WITH YOUR NAUSEA MEDICATION  *UNUSUAL SHORTNESS OF BREATH  *UNUSUAL BRUISING OR BLEEDING  TENDERNESS IN MOUTH AND THROAT WITH OR WITHOUT PRESENCE OF ULCERS  *URINARY PROBLEMS  *BOWEL PROBLEMS  UNUSUAL RASH Items with * indicate a potential emergency and should be followed up as soon as possible.  Feel free to call the clinic should you have any questions or concerns. The clinic phone number is [phone].  Please show the CHEMO ALERT CARD at check-in to the Emergency Department and triage nurse.   

## 2020-07-21 ENCOUNTER — Ambulatory Visit: Payer: BC Managed Care – PPO

## 2020-07-21 ENCOUNTER — Ambulatory Visit
Admission: RE | Admit: 2020-07-21 | Discharge: 2020-07-21 | Disposition: A | Discharge status: Home / Self Care | Payer: BC Managed Care – PPO | Source: Ambulatory Visit | Attending: Radiation Oncology | Admitting: Radiation Oncology

## 2020-07-21 ENCOUNTER — Other Ambulatory Visit: Payer: Self-pay

## 2020-07-21 DIAGNOSIS — C770 Secondary and unspecified malignant neoplasm of lymph nodes of head, face and neck: Secondary | ICD-10-CM | POA: Diagnosis not present

## 2020-07-21 DIAGNOSIS — Z87891 Personal history of nicotine dependence: Secondary | ICD-10-CM | POA: Diagnosis not present

## 2020-07-21 DIAGNOSIS — Z794 Long term (current) use of insulin: Secondary | ICD-10-CM | POA: Diagnosis not present

## 2020-07-21 DIAGNOSIS — E119 Type 2 diabetes mellitus without complications: Secondary | ICD-10-CM | POA: Diagnosis not present

## 2020-07-21 DIAGNOSIS — R0989 Other specified symptoms and signs involving the circulatory and respiratory systems: Secondary | ICD-10-CM | POA: Diagnosis not present

## 2020-07-21 DIAGNOSIS — C01 Malignant neoplasm of base of tongue: Secondary | ICD-10-CM | POA: Diagnosis not present

## 2020-07-22 ENCOUNTER — Ambulatory Visit: Payer: BC Managed Care – PPO

## 2020-07-22 ENCOUNTER — Ambulatory Visit
Admission: RE | Admit: 2020-07-22 | Discharge: 2020-07-22 | Disposition: A | Discharge status: Home / Self Care | Payer: BC Managed Care – PPO | Source: Ambulatory Visit | Attending: Radiation Oncology | Admitting: Radiation Oncology

## 2020-07-22 DIAGNOSIS — Z87891 Personal history of nicotine dependence: Secondary | ICD-10-CM | POA: Diagnosis not present

## 2020-07-22 DIAGNOSIS — C01 Malignant neoplasm of base of tongue: Secondary | ICD-10-CM | POA: Diagnosis not present

## 2020-07-22 DIAGNOSIS — C770 Secondary and unspecified malignant neoplasm of lymph nodes of head, face and neck: Secondary | ICD-10-CM | POA: Diagnosis not present

## 2020-07-23 ENCOUNTER — Ambulatory Visit: Payer: BC Managed Care – PPO

## 2020-07-23 ENCOUNTER — Ambulatory Visit
Admission: RE | Admit: 2020-07-23 | Discharge: 2020-07-23 | Disposition: A | Discharge status: Home / Self Care | Payer: BC Managed Care – PPO | Source: Ambulatory Visit | Attending: Radiation Oncology | Admitting: Radiation Oncology

## 2020-07-23 DIAGNOSIS — C770 Secondary and unspecified malignant neoplasm of lymph nodes of head, face and neck: Secondary | ICD-10-CM | POA: Diagnosis not present

## 2020-07-23 DIAGNOSIS — Z87891 Personal history of nicotine dependence: Secondary | ICD-10-CM | POA: Diagnosis not present

## 2020-07-23 DIAGNOSIS — C01 Malignant neoplasm of base of tongue: Secondary | ICD-10-CM | POA: Diagnosis not present

## 2020-07-25 NOTE — Progress Notes (Incomplete)
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 Pt is accompanied today by his sister. The pt reports that he is doing well overall.  The pt reports that he was a long-term smoker and due to that he has been a participant in a lung cancer screening program. While doing routine monitoring small lung nodules were first noticed in 2017. They did not feel that action needed to be taken at that time so they simply continued monitoring him. A significant change was noticed within the last 6 months. Pt denies any fevers, chills, night sweats, SOB during this time. Pt had a Thoracoscopy with Middle Lobectomy with Dr. Koleen Nimrod on 05/08/2019. He currently feels well and denies any of the aforementioned symptoms except an increase in SOB, noticed after surgery.   Pt quit smoking about 10 years ago. He was diagnosed with COPD in 2018 and given an inhaler. He did not notice a difference with the inhaler so he has since stopped using it. Pt has not had any issues with vertigo in sometime and his diabetes has been well controlled. Pt has a history of Prostate Cancer and had it resected in January of 2019. Pt also had a cholecystectomy, which they decided on due to activity seen on 2017 PET scan. He denies any issues  resulting from his gallbladder removal. Diabetes and heart disease runs in his family. His father had prostate cancer and heart disease as well.   Pt will return to work in Spring. There is dust and floating debris at his job but he is not around it very often. Pt has stayed up to date with his annual flu vaccine as well as his pneumonia vaccines.   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 Masslike area of ground-glass straddles the minor fissure with septal thickening and fissural retraction. While the lesion has increased in size minimally from 09/30/2018, there is clear enlargement from baseline examination on 02/16/2016. Therefore, lesion is characterized as worrisome for adenocarcinoma, Lung-RADS 4B, suspicious. Additional imaging evaluation or consultation with Pulmonology or Thoracic Surgery recommended. These results will be called to the ordering clinician or representative by the Radiologist Assistant, and communication documented in the PACS or zVision Dashboard. 2. Liver hemangiomas and left adrenal adenoma, best characterized on 03/03/2016. 3. Aortic atherosclerosis (ICD10-170.0). Coronary artery calcification."  Pt has had PET/CT ([id number]) completed on 02/25/2016 with results revealing "Dominant 8 mm right lower lobe pulmonary nodule shows no significant FDG uptake. Other scattered less than 5 mm bilateral pulmonary nodules also show no FDG uptake but are too small to characterize by PET. Recommend followup by  chest CT in 6 months. (please use the following order, "CT CHEST LCS NODULE FOLLOW-UP W/O CM") Right middle lobe ground-glass  opacity shows low-grade FDG uptake, and differential diagnosis includes inflammatory or infectious processes and low-grade adenocarcinoma. Recommend followup by chest CT in 6 months.  Multiple low-attenuation liver masses show no hypermetabolic activity compared to normal hepatic parenchyma. These remain indeterminate and cannot be characterized without IV contrast. Abdomen MRI without and with contrast recommended for further evaluation. 2.5 cm left adrenal mass shows mild FDG uptake, and remains indeterminate. Differential diagnosis includes atypical adenoma, metastasis, pheochromocytoma, or less likely adrenal cortical carcinoma. This can also be further evaluated with abdomen MRI without and with contrast."  Most recent lab results (05/12/2019) of CBC is as follows: all values are WNL except for RBC at 3.62, Hgb at 11.2, HCT at 34.2.  On review of systems, pt reports increased SOB and denies testicular pain/swelling, leg swelling, fevers, chills, night sweats and any other symptoms.   On PMHx the pt reports COPD with emphysema, Benign Positional Vertigo, Diabetes, Prostate Cancer, Prostatectomy (2019), Cholecystectomy. On Social Hx the pt reports he is a former smoker who quit 10 years ago On Family Hx the pt reports that his father had Prostate Cancer and Heart Disease, additional family history of Heart Disease and Diabetes.  INTERVAL HISTORY:  Jose Harvey is a wonderful 60 y.o. male who is here for evaluation and management of newly diagnosed head and neck squamous cell carcinoma. [name] last visit with [place] was on 07/13/2020. The pt reports that he is doing well overall. He is here for ***.  The pt reports ***  Lab results today 07/26/2020 of CBC w/diff and CMP is as follows: all values are WNL except for ***  On review of systems, pt reports *** and denies *** and any other symptoms.  MEDICAL HISTORY:  Past Medical History:  Diagnosis Date  . Adrenal adenoma, left 03/2017    noted on CT  . Allergic rhinitis, cause unspecified   . Anxiety state, unspecified   . Aortic atherosclerosis ([place]) 03/2017   noted on CT  . BPV (benign positional vertigo)   . COPD (chronic obstructive pulmonary disease) with emphysema (HCC)    mild followed by [facility]   . Coronary artery disease   . Depressive disorder, not elsewhere classified   . Dyspnea    history of  . ED (erectile dysfunction)   . Gallbladder polyp 03/2016   noted on CT  . Hepatic hemangioma 03/2017   noted on CT  . History of kidney stones    hx of years ago   . Indigestion   . 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 Mixed hyperlipidemia   . Prostate cancer ([place])   . 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 Type I (juvenile type) diabetes mellitus without mention of complication, not stated as uncontrolled    type I - followed by Dr Buddy Duty   . Vesicoureteral reflux     SURGICAL HISTORY: Past Surgical History:  Procedure Laterality Date  . CHOLECYSTECTOMY N/A 05/17/2016   Procedure: LAPAROSCOPIC CHOLECYSTECTOMY;  Surgeon: Clovis Riley, MD;  Location: [facility];  Service: General;  Laterality: N/A;  . cyst removed from throat     . dental surgeries     . IR GASTROSTOMY TUBE MOD SED  07/07/2020  . IR IMAGING GUIDED PORT INSERTION  07/07/2020  . LYMPHADENECTOMY Bilateral 07/02/2017  Procedure: BILATERAL LYMPHADENECTOMY;  Surgeon: Lucas Mallow, MD;  Location: [facility];  Service: Urology;  Laterality: Bilateral;  . PROSTATE BIOPSY    . ROBOT ASSISTED LAPAROSCOPIC RADICAL PROSTATECTOMY N/A 07/02/2017   Procedure: XI ROBOTIC ASSISTED LAPAROSCOPIC RADICAL PROSTATECTOMY;  Surgeon: Lucas Mallow, MD;  Location: [facility];  Service: Urology;  Laterality: N/A;  . TONSILLECTOMY    . VIDEO ASSISTED THORACOSCOPY (VATS)/ LOBECTOMY Right 05/08/2019   Procedure: Right VIDEO ASSISTED THORACOSCOPY with   MIDDLE LOBECTOMY and  Enbloc portion of Upper Lobe with Node dissection, Intercostal Nerve Block;  Surgeon: Melrose Nakayama, MD;  Location: [facility];  Service: Thoracic;  Laterality: Right;  . WISDOM TOOTH EXTRACTION      SOCIAL HISTORY: Social History   Socioeconomic History  . Marital status: Divorced    Spouse name: Not on file  . Number of children: 2  . Years of education: Not on file  . 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 Smoking status: Former Smoker    Packs/day: 1.50    Years: 27.00    Pack years: 40.50    Types: Cigarettes    Quit date: 06/05/2008    Years since quitting: 12.1  . Smokeless tobacco: Former Systems developer    Types: Snuff    Quit date: 2015  Vaping Use  . Vaping Use: Never used  Substance and Sexual Activity  . Alcohol use: Yes    Alcohol/week: 6.0 standard drinks    Types: 6 Cans of beer per week    Comment: occasional 6pk per week  . Drug use: No  . Sexual activity: Not Currently  Other Topics Concern  . Not on file  Social History Narrative   Patient divorced. Patient has two grown daughters but neither lives locally.   Social Determinants of Health   Financial Resource Strain: Low Risk   . Difficulty of Paying Living Expenses: Not very hard  Food Insecurity: No Food Insecurity  . Worried About Charity fundraiser in the Last Year: Never true  . Ran Out of Food in the Last Year: Never true  Transportation Needs: No Transportation Needs  . Lack of Transportation (Medical): No  . Lack of Transportation (Non-Medical): No  Physical Activity: Not on file  Stress: No Stress Concern Present  . Feeling of Stress : Only a little  Social Connections: Unknown  . Frequency of Communication with Friends and Family: More than three times a week  . Frequency of Social Gatherings with Friends and Family: More than three times a week  . Attends Religious Services: More than 4 times per year  . Active Member of  Clubs or Organizations: Yes  . Attends Archivist Meetings: More than 4 times per year  . Marital Status: Not on file  Intimate Partner Violence: Not on file    FAMILY HISTORY: Family History  Problem Relation Age of Onset  . Diabetes type I Father   . Heart attack Father   . Hypertension Father   . Emphysema Father   . CAD Father   . Colon cancer Maternal Grandfather   . Emphysema Paternal Grandfather   . Diabetes type I Paternal Grandfather   . Kidney disease Neg Hx   . Liver disease Neg Hx   . Breast cancer Neg Hx   . Prostate cancer Neg Hx     ALLERGIES:  is allergic to crestor [rosuvastatin], lipitor [  atorvastatin], other, and zocor [simvastatin].  MEDICATIONS:  Current Outpatient Medications  Medication Sig Dispense Refill  . lidocaine (XYLOCAINE) 2 % solution Patient: Mix 1part 2% viscous lidocaine, 1part H20. Swish & swallow 47mL of diluted mixture, 59min before meals and at bedtime, up to QID 200 mL 4  . aspirin 81 MG tablet Take 1 tablet (81 mg total) by mouth daily. 30 tablet   . cetirizine (ZYRTEC) 10 MG tablet Take 10 mg by mouth daily.    . chlorhexidine (PERIDEX) 0.12 % solution Use as directed 15 mLs in the mouth or throat daily.    . Coenzyme Q10 (COQ10) 100 MG CAPS Take 100 mg by mouth daily.    . Continuous Blood Gluc Sensor (FREESTYLE LIBRE 14 DAY SENSOR) MISC AS DIRECTED CHANGE SENSOR EVERY 14 DAYS 28    . dexamethasone (DECADRON) 4 MG tablet Take 2 tablets (8 mg total) by mouth daily. Start the day after chemotherapy for 2 days. 30 tablet 1  . ibuprofen (ADVIL) 200 MG tablet Take 400-800 mg by mouth every 6 (six) hours as needed for moderate pain. (Patient not taking: Reported on 06/17/2020)    . Insulin Human (INSULIN PUMP) SOLN Inject into the skin continuous. NOVOLOG 100 UNIT/ML VIAL    . lidocaine-prilocaine (EMLA) cream Apply to affected area once 30 g 3  . lisinopril (PRINIVIL,ZESTRIL) 5 MG tablet Take 5 mg by mouth daily.     Marland Kitchen LORazepam  (ATIVAN) 0.5 MG tablet Take 1 tablet (0.5 mg total) by mouth every 6 (six) hours as needed (Nausea or vomiting). 30 tablet 0  . Multiple Vitamins-Minerals (ICAPS AREDS 2) CAPS Take 1 capsule by mouth daily.    Marland Kitchen NOVOLOG 100 UNIT/ML injection Averages about 30 units per day via insulin pump    . Nutritional Supplements (FEEDING SUPPLEMENT, GLUCERNA 1.5 CAL,) LIQD Give 2 cartons at 8am, noon and 4pm. Give 1 carton at 8pm. Flush with 32ml of water before and after.  Drink or give additional water via tube of 217ml 3 times per day.  Meets 100% of needs.  Send bolus supplies. 1659 mL 5  . ondansetron (ZOFRAN) 8 MG tablet Take 1 tablet (8 mg total) by mouth 2 (two) times daily as needed for refractory nausea / vomiting. Start on day 3 after chemo. 30 tablet 1  . pantoprazole (PROTONIX) 40 MG tablet Take 40 mg by mouth daily.    . prochlorperazine (COMPAZINE) 10 MG tablet Take 1 tablet (10 mg total) by mouth every 6 (six) hours as needed (Nausea or vomiting). 30 tablet 1  . rosuvastatin (CRESTOR) 5 MG tablet Take 1 tablet (5 mg total) by mouth daily. 90 tablet 3  . sertraline (ZOLOFT) 100 MG tablet Take 50 mg by mouth daily.  0   No current facility-administered medications for this visit.    REVIEW OF SYSTEMS:   10 Point review of Systems was done is negative except as noted above.  PHYSICAL EXAMINATION: ECOG PERFORMANCE STATUS: 0 - Asymptomatic  *** GENERAL:alert, in no acute distress and comfortable SKIN: no acute rashes, no significant lesions EYES: conjunctiva are pink and non-injected, sclera anicteric OROPHARYNX: MMM, no exudates, no oropharyngeal erythema or ulceration NECK: supple, no JVD LYMPH:  no palpable lymphadenopathy in the cervical, axillary or inguinal regions LUNGS: clear to auscultation b/l with normal respiratory effort HEART: regular rate & rhythm ABDOMEN:  normoactive bowel sounds , non tender, not distended. Extremity: no pedal edema PSYCH: alert & oriented x 3 with  fluent speech NEURO:  no focal motor/sensory deficits   LABORATORY DATA:  I have reviewed the data as listed  . CBC Latest Ref Rng & Units 07/20/2020 07/13/2020 07/07/2020  WBC 4.0 - 10.5 K/uL 6.7 5.9 10.1  Hemoglobin 13.0 - 17.0 g/dL 13.2 13.1 13.4  Hematocrit 39.0 - 52.0 % 38.2(L) 39.0 40.0  Platelets 150 - 400 K/uL 235 208 229    CMP Latest Ref Rng & Units 07/20/2020 07/13/2020 07/05/2020  Glucose 70 - 99 mg/dL 258(H) 170(H) 236(H)  BUN 8 - 23 mg/dL 21 19 19   Creatinine 0.61 - 1.24 mg/dL 0.98 0.91 1.05  Sodium 135 - 145 mmol/L 132(L) 137 139  Potassium 3.5 - 5.1 mmol/L 4.6 4.2 4.4  Chloride 98 - 111 mmol/L 99 105 104  CO2 22 - 32 mmol/L 26 26 27   Calcium 8.9 - 10.3 mg/dL 9.0 9.0 9.2  Total Protein 6.5 - 8.1 g/dL 6.9 6.8 7.3  Total Bilirubin 0.3 - 1.2 mg/dL 0.3 0.3 0.4  Alkaline Phos 38 - 126 U/L 77 74 75  AST 15 - 41 U/L 20 17 24   ALT 0 - 44 U/L 29 24 39   05/26/2020 Right Cervical LN Bx ([id number]):   05/08/2019 Surgical Pathology [phone]):    05/08/2019 Surgical Pathology [phone]):    RADIOGRAPHIC STUDIES: I have personally reviewed the radiological images as listed and agreed with the findings in the report. IR Gastrostomy Tube  Result Date: 07/07/2020 INDICATION: 60 year old male with recently diagnosed head neck cancer requiring percutaneous enteric access. EXAM: PERC PLACEMENT GASTROSTOMY MEDICATIONS: 2 mg Ancef, intravenous; Antibiotics were administered within 1 hour of the procedure. ANESTHESIA/SEDATION: Versed 3 mg IV; Fentanyl 0 mcg IV Moderate Sedation Time:  20 The patient was continuously monitored during the procedure by the interventional radiology nurse under my direct supervision. CONTRAST:  28mL OMNIPAQUE IOHEXOL 300 MG/ML SOLN - administered into the gastric lumen. FLUOROSCOPY TIME:  Fluoroscopy Time: 0 minutes 42 seconds (8 mGy). COMPLICATIONS: None immediate. PROCEDURE: Informed written consent was obtained from the patient after a thorough  discussion of the procedural risks, benefits and alternatives. All questions were addressed. Maximal Sterile Barrier Technique was utilized including caps, mask, sterile gowns, sterile gloves, sterile drape, hand hygiene and skin antiseptic. A timeout was performed prior to the initiation of the procedure. The patient was placed on the procedure table in the supine position. Pre-procedure abdominal film confirmed visualization of the transverse colon. An angled 5-French catheter was passed through the nares into the stomach. The patient was prepped and draped in usual sterile fashion. The stomach was insufflated with air via the indwelling nasogastric tube. Under fluoroscopy, a puncture site was selected and local analgesia achieved with 1% lidocaine infiltrated subcutaneously. Under fluoroscopic guidance, a gastropexy needle was passed into the stomach and the T-bar suture was released. Entry into the stomach was confirmed with fluoroscopy, aspiration of air, and injection of contrast material. This was repeated with an additional gastropexy suture (for a total of 2 fasteners). At the center of these gastropexy sutures, a dermatotomy was performed. An 18 gauge needle was passed into the stomach at the site of this dermatotomy, and position within the gastric lumen again confirmed under fluoroscopy using aspiration of air and contrast injection. An Amplatz guidewire was passed through this needle and intraluminal placement within the stomach was confirmed by fluoroscopy. The needle was removed. Over the guidewire, the percutaneous tract was dilated using a 10 mm non-compliant balloon. The balloon was deflated, then pushed into the gastric lumen followed in concert  by the 20 Fr gastrostomy tube. The retention balloon of the percutaneous gastrostomy tube was inflated with 20 mL of sterile water. The tube was withdrawn until the retention balloon was at the edge of the gastric lumen. The external bumper was brought to  the abdominal wall. Contrast was injected through the gastrostomy tube, confirming intraluminal positioning. The patient tolerated the procedure well without any immediate post-procedural complications. IMPRESSION: Technically successful placement of 20 Fr gastrostomy tube. Absorbable gastropexy sutures do not require additional appointment for removal. Ruthann Cancer, MD [facility] Electronically Signed   By: Ruthann Cancer MD   On: 07/07/2020 12:12   IR IMAGING GUIDED PORT INSERTION  Result Date: 07/07/2020 INDICATION: 60 year old male with newly diagnosed head neck cancer requiring central venous access for chemotherapy. EXAM: IMPLANTED PORT A CATH PLACEMENT WITH ULTRASOUND AND FLUOROSCOPIC GUIDANCE COMPARISON:  None. MEDICATIONS: Ancef 2 gm IV; The antibiotic was administered within an appropriate time interval prior to skin puncture. ANESTHESIA/SEDATION: Moderate (conscious) sedation was employed during this procedure. A total of Versed 3 mg and Fentanyl 100 mcg was administered intravenously. Moderate Sedation Time: 17 minutes. [name] level of consciousness and vital signs were monitored continuously by radiology nursing throughout the procedure under my direct supervision. CONTRAST:  None FLUOROSCOPY TIME:  0 minutes, 6 seconds (1 mGy) COMPLICATIONS: None immediate. PROCEDURE: The procedure, risks, benefits, and alternatives were explained to the patient. Questions regarding the procedure were encouraged and answered. The patient understands and consents to the procedure. The right neck and chest were prepped with chlorhexidine in a sterile fashion, and a sterile drape was applied covering the operative field. Maximum barrier sterile technique with sterile gowns and gloves were used for the procedure. A timeout was performed prior to the initiation of the procedure. Ultrasound was used to examine the jugular vein which was compressible and  free of internal echoes. A skin marker was used to demarcate the planned venotomy and port pocket incision sites. Local anesthesia was provided to these sites and the subcutaneous tunnel track with 1% lidocaine with 1:100,000 epinephrine. A small incision was created at the jugular access site and blunt dissection was performed of the subcutaneous tissues. Under real time ultrasound guidance, the jugular vein was accessed with a 21 ga micropuncture needle and an 0.018" wire was inserted to the superior vena cava. A 5 Fr micopuncture set was then used, through which a 0.035" Rosen wire was passed under fluoroscopic guidance into the inferior vena cava. An 8 Fr dilator was then placed over the wire. A subcutaneous port pocket was then created along the upper chest wall utilizing a combination of sharp and blunt dissection. The pocket was irrigated with sterile saline, packed with gauze, and observed for hemorrhage. A single lumen "ISP" sized power injectable port was chosen for placement. The 8 Fr catheter was tunneled from the port pocket site to the venotomy incision. The port was placed in the pocket. The external catheter was trimmed to appropriate length. The dilator was exchanged for an 8 Fr peel-away sheath under fluoroscopic guidance. The catheter was then placed through the sheath and the sheath was removed. Final catheter positioning was confirmed and documented with a fluoroscopic spot radiograph. The port was accessed with a Huber needle, aspirated, and flushed with heparinized saline. The deep dermal layer of the port pocket incision was closed with interrupted 3-0 Vicryl suture. Dermabond was then placed over the port pocket and neck incisions. The patient tolerated the procedure well  without immediate post procedural complication. FINDINGS: After catheter placement, the tip lies within the cavoatrial junction. The catheter aspirates and flushes normally and is ready for immediate use. IMPRESSION:  Successful placement of a power injectable Port-A-Cath via the right internal jugular vein. The catheter is ready for immediate use. 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 Plan -I discussed with the patient his pathology results in details showing new diagnosis of squamous cell carcinoma of the base of the tongue metastatic to right cervical lymph nodes. -We discussed that tumor is p16 positive suggests its at least partly mediated by HPV.  This by itself does suggest a better prognosis however increasingly there is data showing a smoking history of more than 30 pack years does adversely affect this favorable prognosis. -We discussed NCCN guidelines and treatment options.  He has non bulky right level 2 lymphadenopathy.  Treatment options presented were consideration of TORS surgery with lymph node dissection if deemed technically feasible by ENT.  He understands that there might be a role for adjuvant radiation or chemoradiation if he does have high risk features on pathology after surgery. -Alternative option if surgery is not technically feasible would be to consider concurrent chemotherapy and radiation.  Patient would not be the best candidate for high-dose concurrent cisplatin given his diabetic nephropathy, significant pulmonary and cardiac comorbidities and an eye to avoid immune suppression in the setting of known extranodal marginal zone lymphoma.  If this option is to be exercised might need to go with  carboplatin/Taxol weekly. -Patient has not had a detailed discussion with [facility] to discuss the technicalities and feasibility and pros and cons of primary surgical resection of his tumor and would like to pursue this. -He has been referred to see Dr. Silvio Clayman at [facility] to discuss TORS. -All the patient's and his wife's concerns were addressed in details.    -Counseled extensively on continued smoking cessation and avoiding alcohol use.    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 Masslike area of ground-glass straddles the minor fissure with septal thickening and fissural retraction. While the lesion has increased in size minimally from 09/30/2018, there is clear enlargement from baseline examination on 02/16/2016. Therefore, lesion is characterized as worrisome for adenocarcinoma, Lung-RADS 4B, suspicious. -02/25/2016 PET/CT ([id number]) revealed "Dominant 8 mm right lower lobe pulmonary nodule shows no significant FDG uptake. Other scattered less than 5 mm bilateral pulmonary nodules also show no FDG uptake but are too small to characterize by PET. Right middle lobe ground-glass opacity shows low-grade FDG uptake, and differential diagnosis includes inflammatory or infectious processes and low-grade  adenocarcinoma. Multiple low-attenuation liver masses show no hypermetabolic activity compared to normal hepatic parenchyma. These remain indeterminate and cannot be characterized without IV contrast. Abdomen MRI without  and with contrast recommended for further evaluation. 2.5 cm left adrenal mass shows mild FDG uptake, and remains indeterminate. Differential diagnosis includes atypical adenoma, metastasis, pheochromocytoma, or less likely adrenal cortical carcinoma."  PLAN: -Discussed pt labwork today, 07/26/2020; ***   -Advised pt he is tolerating the treatment very well.  -Continue Vitamin B Complex daily. -Advise pt to eat well, drink 48-64 oz water daily, and stay active. -Recommend pt take Multivitamin daily. -Will see back in ***   FOLLOW UP: ***    The total time spent in the appointment was *** minutes and more than 50% was on counseling and direct patient cares. .  All of the patient's questions were answered with apparent satisfaction. The patient knows to call the clinic with any problems, questions or concerns.   Sullivan Lone MD [place] AAHIVMS [facility] Hematology/Oncology Physician [facility]  (Office):       [phone] (Work cell):  [phone] (Fax):           [phone]  07/25/2020 9:11 AM  I, Reinaldo Raddle, am acting as scribe for Dr. Sullivan Lone, MD.

## 2020-07-26 ENCOUNTER — Inpatient Hospital Stay: Payer: BC Managed Care – PPO

## 2020-07-26 ENCOUNTER — Other Ambulatory Visit: Payer: BC Managed Care – PPO

## 2020-07-26 ENCOUNTER — Inpatient Hospital Stay: Payer: BC Managed Care – PPO | Admitting: Hematology

## 2020-07-26 ENCOUNTER — Other Ambulatory Visit: Payer: Self-pay | Admitting: *Deleted

## 2020-07-26 ENCOUNTER — Ambulatory Visit: Payer: BC Managed Care – PPO

## 2020-07-26 ENCOUNTER — Inpatient Hospital Stay (HOSPITAL_BASED_OUTPATIENT_CLINIC_OR_DEPARTMENT_OTHER): Payer: BC Managed Care – PPO | Admitting: Hematology

## 2020-07-26 ENCOUNTER — Telehealth: Payer: Self-pay | Admitting: *Deleted

## 2020-07-26 ENCOUNTER — Ambulatory Visit
Admission: RE | Admit: 2020-07-26 | Discharge: 2020-07-26 | Disposition: A | Discharge status: Home / Self Care | Payer: BC Managed Care – PPO | Source: Ambulatory Visit | Attending: Radiation Oncology | Admitting: Radiation Oncology

## 2020-07-26 ENCOUNTER — Other Ambulatory Visit: Payer: Self-pay

## 2020-07-26 VITALS — BP 112/76 | HR 82 | Temp 98.1°F | Resp 18 | Wt 170.8 lb

## 2020-07-26 DIAGNOSIS — C01 Malignant neoplasm of base of tongue: Secondary | ICD-10-CM

## 2020-07-26 DIAGNOSIS — D1809 Hemangioma of other sites: Secondary | ICD-10-CM | POA: Diagnosis not present

## 2020-07-26 DIAGNOSIS — I700 Atherosclerosis of aorta: Secondary | ICD-10-CM | POA: Diagnosis not present

## 2020-07-26 DIAGNOSIS — Z95828 Presence of other vascular implants and grafts: Secondary | ICD-10-CM

## 2020-07-26 DIAGNOSIS — C770 Secondary and unspecified malignant neoplasm of lymph nodes of head, face and neck: Secondary | ICD-10-CM | POA: Diagnosis not present

## 2020-07-26 DIAGNOSIS — Z836 Family history of other diseases of the respiratory system: Secondary | ICD-10-CM | POA: Diagnosis not present

## 2020-07-26 DIAGNOSIS — Z79899 Other long term (current) drug therapy: Secondary | ICD-10-CM | POA: Diagnosis not present

## 2020-07-26 DIAGNOSIS — Z5111 Encounter for antineoplastic chemotherapy: Secondary | ICD-10-CM | POA: Diagnosis not present

## 2020-07-26 DIAGNOSIS — Z7289 Other problems related to lifestyle: Secondary | ICD-10-CM | POA: Diagnosis not present

## 2020-07-26 DIAGNOSIS — J449 Chronic obstructive pulmonary disease, unspecified: Secondary | ICD-10-CM | POA: Diagnosis not present

## 2020-07-26 DIAGNOSIS — Z8546 Personal history of malignant neoplasm of prostate: Secondary | ICD-10-CM | POA: Diagnosis not present

## 2020-07-26 DIAGNOSIS — Z7189 Other specified counseling: Secondary | ICD-10-CM

## 2020-07-26 DIAGNOSIS — Z833 Family history of diabetes mellitus: Secondary | ICD-10-CM | POA: Diagnosis not present

## 2020-07-26 DIAGNOSIS — Z87891 Personal history of nicotine dependence: Secondary | ICD-10-CM | POA: Diagnosis not present

## 2020-07-26 DIAGNOSIS — D3502 Benign neoplasm of left adrenal gland: Secondary | ICD-10-CM | POA: Diagnosis not present

## 2020-07-26 DIAGNOSIS — R0602 Shortness of breath: Secondary | ICD-10-CM | POA: Diagnosis not present

## 2020-07-26 DIAGNOSIS — C884 Extranodal marginal zone B-cell lymphoma of mucosa-associated lymphoid tissue [MALT-lymphoma]: Secondary | ICD-10-CM | POA: Diagnosis not present

## 2020-07-26 DIAGNOSIS — Z8249 Family history of ischemic heart disease and other diseases of the circulatory system: Secondary | ICD-10-CM | POA: Diagnosis not present

## 2020-07-26 LAB — CBC WITH DIFFERENTIAL (CANCER CENTER ONLY)
Abs Immature Granulocytes: 0.03 10*3/uL (ref 0.00–0.07)
Basophils Absolute: 0.1 10*3/uL (ref 0.0–0.1)
Basophils Relative: 1 %
Eosinophils Absolute: 0.1 10*3/uL (ref 0.0–0.5)
Eosinophils Relative: 1 %
HCT: 38.4 % — ABNORMAL LOW (ref 39.0–52.0)
Hemoglobin: 13.2 g/dL (ref 13.0–17.0)
Immature Granulocytes: 1 %
Lymphocytes Relative: 6 %
Lymphs Abs: 0.3 10*3/uL — ABNORMAL LOW (ref 0.7–4.0)
MCH: 30.4 pg (ref 26.0–34.0)
MCHC: 34.4 g/dL (ref 30.0–36.0)
MCV: 88.5 fL (ref 80.0–100.0)
Monocytes Absolute: 0.6 10*3/uL (ref 0.1–1.0)
Monocytes Relative: 10 %
Neutro Abs: 5.1 10*3/uL (ref 1.7–7.7)
Neutrophils Relative %: 81 %
Platelet Count: 273 10*3/uL (ref 150–400)
RBC: 4.34 MIL/uL (ref 4.22–5.81)
RDW: 12.1 % (ref 11.5–15.5)
WBC Count: 6.2 10*3/uL (ref 4.0–10.5)
nRBC: 0 % (ref 0.0–0.2)

## 2020-07-26 LAB — CMP (CANCER CENTER ONLY)
ALT: 28 U/L (ref 0–44)
AST: 22 U/L (ref 15–41)
Albumin: 3.5 g/dL (ref 3.5–5.0)
Alkaline Phosphatase: 85 U/L (ref 38–126)
Anion gap: 9 (ref 5–15)
BUN: 23 mg/dL (ref 8–23)
CO2: 25 mmol/L (ref 22–32)
Calcium: 9.2 mg/dL (ref 8.9–10.3)
Chloride: 100 mmol/L (ref 98–111)
Creatinine: 0.92 mg/dL (ref 0.61–1.24)
GFR, Estimated: >60 mL/min (ref >60)
Glucose, Bld: 157 mg/dL — ABNORMAL HIGH (ref 70–99)
Potassium: 4.1 mmol/L (ref 3.5–5.1)
Sodium: 134 mmol/L — ABNORMAL LOW (ref 135–145)
Total Bilirubin: 0.3 mg/dL (ref 0.3–1.2)
Total Protein: 7.2 g/dL (ref 6.5–8.1)

## 2020-07-26 LAB — LACTATE DEHYDROGENASE: LDH: 210 U/L — ABNORMAL HIGH (ref 98–192)

## 2020-07-26 LAB — MAGNESIUM: Magnesium: 1.9 mg/dL (ref 1.7–2.4)

## 2020-07-26 MED ORDER — FAMOTIDINE IN NACL 20-0.9 MG/50ML-% IV SOLN
INTRAVENOUS | Status: AC
  Filled 2020-07-26: qty 50

## 2020-07-26 MED ORDER — SODIUM CHLORIDE 0.9 % IV SOLN
Freq: Once | INTRAVENOUS | Status: AC
  Filled 2020-07-26: qty 250

## 2020-07-26 MED ORDER — DIPHENHYDRAMINE HCL 50 MG/ML IJ SOLN
50.0000 mg | Freq: Once | INTRAMUSCULAR | Status: AC
  Administered 2020-07-26: 50 mg via INTRAVENOUS

## 2020-07-26 MED ORDER — SODIUM CHLORIDE 0.9 % IV SOLN
50.0000 mg/m2 | Freq: Once | INTRAVENOUS | Status: AC
  Administered 2020-07-26: 102 mg via INTRAVENOUS
  Filled 2020-07-26: qty 17

## 2020-07-26 MED ORDER — SODIUM CHLORIDE 0.9% FLUSH
10.0000 mL | Freq: Once | INTRAVENOUS | Status: AC
  Administered 2020-07-26: 10 mL
  Filled 2020-07-26: qty 10

## 2020-07-26 MED ORDER — FAMOTIDINE IN NACL 20-0.9 MG/50ML-% IV SOLN
20.0000 mg | Freq: Once | INTRAVENOUS | Status: AC
  Administered 2020-07-26: 20 mg via INTRAVENOUS

## 2020-07-26 MED ORDER — PALONOSETRON HCL INJECTION 0.25 MG/5ML
INTRAVENOUS | Status: AC
  Filled 2020-07-26: qty 5

## 2020-07-26 MED ORDER — SODIUM CHLORIDE 0.9 % IV SOLN
240.0000 mg | Freq: Once | INTRAVENOUS | Status: AC
  Administered 2020-07-26: 240 mg via INTRAVENOUS
  Filled 2020-07-26: qty 24

## 2020-07-26 MED ORDER — SODIUM CHLORIDE 0.9 % IV SOLN
20.0000 mg | Freq: Once | INTRAVENOUS | Status: AC
  Administered 2020-07-26: 20 mg via INTRAVENOUS
  Filled 2020-07-26: qty 20

## 2020-07-26 MED ORDER — PALONOSETRON HCL INJECTION 0.25 MG/5ML
0.2500 mg | Freq: Once | INTRAVENOUS | Status: AC
  Administered 2020-07-26: 0.25 mg via INTRAVENOUS

## 2020-07-26 MED ORDER — SUCRALFATE 1 GM/10ML PO SUSP: 1.0000 g | Freq: Three times a day (TID) | ORAL | 1 refills | Status: DC

## 2020-07-26 MED ORDER — HEPARIN SOD (PORK) LOCK FLUSH 100 UNIT/ML IV SOLN
500.0000 [IU] | Freq: Once | INTRAVENOUS | Status: AC | PRN
  Administered 2020-07-26: 500 [IU]
  Filled 2020-07-26: qty 5

## 2020-07-26 NOTE — Telephone Encounter (Signed)
Contacted patient after he did not come to appts. He apologized and said it was not written on any of his appts sheets.  He is in agreement to come to all appointments at a different time today. Will contact infusion and scheduling to arrange appts for patient for today

## 2020-07-26 NOTE — Patient Instructions (Signed)
[facility] Discharge Instructions for Patients Receiving Chemotherapy  Today you received the following chemotherapy agents Paclitaxel(Taxol), Carboplatin.  To help prevent nausea and vomiting after your treatment, we encourage you to take your nausea medication as directed.   If you develop nausea and vomiting that is not controlled by your nausea medication, call the clinic.   BELOW ARE SYMPTOMS THAT SHOULD BE REPORTED IMMEDIATELY:  *FEVER GREATER THAN 100.5 F  *CHILLS WITH OR WITHOUT FEVER  NAUSEA AND VOMITING THAT IS NOT CONTROLLED WITH YOUR NAUSEA MEDICATION  *UNUSUAL SHORTNESS OF BREATH  *UNUSUAL BRUISING OR BLEEDING  TENDERNESS IN MOUTH AND THROAT WITH OR WITHOUT PRESENCE OF ULCERS  *URINARY PROBLEMS  *BOWEL PROBLEMS  UNUSUAL RASH Items with * indicate a potential emergency and should be followed up as soon as possible.  Feel free to call the clinic should you have any questions or concerns. The clinic phone number is [phone].  Please show the CHEMO ALERT CARD at check-in to the Emergency Department and triage nurse.   

## 2020-07-27 ENCOUNTER — Other Ambulatory Visit: Payer: Self-pay | Admitting: Hematology

## 2020-07-27 ENCOUNTER — Ambulatory Visit: Payer: BC Managed Care – PPO

## 2020-07-27 ENCOUNTER — Ambulatory Visit
Admission: RE | Admit: 2020-07-27 | Discharge: 2020-07-27 | Disposition: A | Discharge status: Home / Self Care | Payer: BC Managed Care – PPO | Source: Ambulatory Visit | Attending: Radiation Oncology | Admitting: Radiation Oncology

## 2020-07-27 DIAGNOSIS — Z87891 Personal history of nicotine dependence: Secondary | ICD-10-CM | POA: Diagnosis not present

## 2020-07-27 DIAGNOSIS — E119 Type 2 diabetes mellitus without complications: Secondary | ICD-10-CM | POA: Diagnosis not present

## 2020-07-27 DIAGNOSIS — C01 Malignant neoplasm of base of tongue: Secondary | ICD-10-CM | POA: Diagnosis not present

## 2020-07-27 DIAGNOSIS — C770 Secondary and unspecified malignant neoplasm of lymph nodes of head, face and neck: Secondary | ICD-10-CM | POA: Diagnosis not present

## 2020-07-27 DIAGNOSIS — R0989 Other specified symptoms and signs involving the circulatory and respiratory systems: Secondary | ICD-10-CM | POA: Diagnosis not present

## 2020-07-27 DIAGNOSIS — Z794 Long term (current) use of insulin: Secondary | ICD-10-CM | POA: Diagnosis not present

## 2020-07-28 ENCOUNTER — Ambulatory Visit
Admission: RE | Admit: 2020-07-28 | Discharge: 2020-07-28 | Disposition: A | Discharge status: Home / Self Care | Payer: BC Managed Care – PPO | Source: Ambulatory Visit | Attending: Radiation Oncology | Admitting: Radiation Oncology

## 2020-07-28 ENCOUNTER — Other Ambulatory Visit: Payer: Self-pay

## 2020-07-28 ENCOUNTER — Ambulatory Visit: Payer: BC Managed Care – PPO

## 2020-07-28 ENCOUNTER — Inpatient Hospital Stay: Payer: BC Managed Care – PPO | Admitting: Nutrition

## 2020-07-28 DIAGNOSIS — Z87891 Personal history of nicotine dependence: Secondary | ICD-10-CM | POA: Diagnosis not present

## 2020-07-28 DIAGNOSIS — C01 Malignant neoplasm of base of tongue: Secondary | ICD-10-CM | POA: Diagnosis not present

## 2020-07-28 DIAGNOSIS — C770 Secondary and unspecified malignant neoplasm of lymph nodes of head, face and neck: Secondary | ICD-10-CM | POA: Diagnosis not present

## 2020-07-28 NOTE — Progress Notes (Signed)
Nutrition follow-up completed with patient status post radiation for tongue cancer.  Patient is receiving concurrent chemoradiation.  Status post PEG placement on February 2. Completes 19/35 radiation treatments today.   Scheduled to complete radiation on March 18. Patient has taste alterations limiting oral intake. He describes thickened saliva. He continues to eat soft foods such as yogurt and baby food.   Weight decreased to 170.4 pounds on February 21 down from 180 pounds usual body weight.  This is approximately 5% loss. Tolerates 5 cartons of Glucerna 1.5 daily, spread frequently throughout the day.  Patient hesitant to increase Glucerna 1.5 because he does not want to elevate his blood sugars. Drinks approximately 2 bottles of water and estimates using a bottle of water via PEG daily. Noted labs: Magnesium 1.9, sodium 134, glucose 157.  Estimated nutrition needs: 2500-2900 cal, 125-145 g protein, 2.5 L fluid.  Nutrition diagnosis: Predicted suboptimal energy intake has evolved into inadequate energy intake related to tongue cancer as evidenced by 10 pound weight loss over 3 weeks.  Intervention: Recommended patient increase Glucerna 1.5-6 cartons daily.  Give 1-1/2 cartons 4 times a day with 120 mL free water. Provided powdered protein samples for patient to mix and use in feeding tube to maximize protein.  Protein powder will provide an additional 75 cal and 18 g of protein.  Stressed importance of flushing feeding tube after bolus of protein powder mixed with water. Explained importance of adequate calories with adequate protein for feeling of weight maintenance. 6 cartons Glucerna 1.5+ protein powder and free water flushes provides 2211 cal, 135.6 g protein and 1560 mL free water.  Patient educated to increase free water between feedings and by mouth to provide approximately 2500 mL daily.  This provides approximately 88% of minimum calorie needs, 100% protein needs.  Monitoring,  evaluation, goals: Patient will tolerate increased tube feeding, oral intake and free water to meet greater than 90% estimated nutrition needs for weight maintenance and healing.  Next visit: Tuesday, March 1 after radiation therapy.  **Disclaimer: This note was dictated with voice recognition software. Similar sounding words can inadvertently be transcribed and this note may contain transcription errors which may not have been corrected upon publication of note.**

## 2020-07-29 ENCOUNTER — Ambulatory Visit: Payer: BC Managed Care – PPO

## 2020-07-29 ENCOUNTER — Ambulatory Visit
Admission: RE | Admit: 2020-07-29 | Discharge: 2020-07-29 | Disposition: A | Discharge status: Home / Self Care | Payer: BC Managed Care – PPO | Source: Ambulatory Visit | Attending: Radiation Oncology | Admitting: Radiation Oncology

## 2020-07-29 ENCOUNTER — Other Ambulatory Visit: Payer: Self-pay

## 2020-07-29 DIAGNOSIS — C770 Secondary and unspecified malignant neoplasm of lymph nodes of head, face and neck: Secondary | ICD-10-CM | POA: Diagnosis not present

## 2020-07-29 DIAGNOSIS — C01 Malignant neoplasm of base of tongue: Secondary | ICD-10-CM | POA: Diagnosis not present

## 2020-07-29 DIAGNOSIS — Z87891 Personal history of nicotine dependence: Secondary | ICD-10-CM | POA: Diagnosis not present

## 2020-07-30 ENCOUNTER — Other Ambulatory Visit: Payer: Self-pay

## 2020-07-30 ENCOUNTER — Ambulatory Visit: Payer: BC Managed Care – PPO

## 2020-07-30 ENCOUNTER — Ambulatory Visit
Admission: RE | Admit: 2020-07-30 | Discharge: 2020-07-30 | Disposition: A | Discharge status: Home / Self Care | Payer: BC Managed Care – PPO | Source: Ambulatory Visit | Attending: Radiation Oncology | Admitting: Radiation Oncology

## 2020-07-30 DIAGNOSIS — C01 Malignant neoplasm of base of tongue: Secondary | ICD-10-CM | POA: Diagnosis not present

## 2020-07-30 DIAGNOSIS — Z87891 Personal history of nicotine dependence: Secondary | ICD-10-CM | POA: Diagnosis not present

## 2020-07-30 DIAGNOSIS — C770 Secondary and unspecified malignant neoplasm of lymph nodes of head, face and neck: Secondary | ICD-10-CM | POA: Diagnosis not present

## 2020-08-02 ENCOUNTER — Ambulatory Visit
Admission: RE | Admit: 2020-08-02 | Discharge: 2020-08-02 | Disposition: A | Discharge status: Home / Self Care | Payer: BC Managed Care – PPO | Source: Ambulatory Visit | Attending: Radiation Oncology | Admitting: Radiation Oncology

## 2020-08-02 ENCOUNTER — Ambulatory Visit: Payer: BC Managed Care – PPO

## 2020-08-02 DIAGNOSIS — C01 Malignant neoplasm of base of tongue: Secondary | ICD-10-CM | POA: Diagnosis not present

## 2020-08-02 DIAGNOSIS — Z87891 Personal history of nicotine dependence: Secondary | ICD-10-CM | POA: Diagnosis not present

## 2020-08-02 DIAGNOSIS — C770 Secondary and unspecified malignant neoplasm of lymph nodes of head, face and neck: Secondary | ICD-10-CM | POA: Diagnosis not present

## 2020-08-02 MED ORDER — SONAFINE EX EMUL
1.0000 "application " | Freq: Two times a day (BID) | CUTANEOUS | Status: DC
  Administered 2020-08-02: 1 via TOPICAL

## 2020-08-03 ENCOUNTER — Inpatient Hospital Stay: Payer: BC Managed Care – PPO

## 2020-08-03 ENCOUNTER — Ambulatory Visit: Payer: BC Managed Care – PPO

## 2020-08-03 ENCOUNTER — Inpatient Hospital Stay: Payer: BC Managed Care – PPO | Attending: Hematology | Admitting: Nutrition

## 2020-08-03 ENCOUNTER — Other Ambulatory Visit: Payer: Self-pay

## 2020-08-03 ENCOUNTER — Ambulatory Visit
Admission: RE | Admit: 2020-08-03 | Discharge: 2020-08-03 | Disposition: A | Discharge status: Home / Self Care | Payer: BC Managed Care – PPO | Source: Ambulatory Visit | Attending: Radiation Oncology | Admitting: Radiation Oncology

## 2020-08-03 VITALS — BP 111/64 | HR 87 | Temp 98.5°F | Resp 18 | Wt 170.2 lb

## 2020-08-03 DIAGNOSIS — I700 Atherosclerosis of aorta: Secondary | ICD-10-CM | POA: Diagnosis not present

## 2020-08-03 DIAGNOSIS — Z836 Family history of other diseases of the respiratory system: Secondary | ICD-10-CM | POA: Diagnosis not present

## 2020-08-03 DIAGNOSIS — Z800 Family history of malignant neoplasm of digestive organs: Secondary | ICD-10-CM | POA: Diagnosis not present

## 2020-08-03 DIAGNOSIS — Z79899 Other long term (current) drug therapy: Secondary | ICD-10-CM | POA: Insufficient documentation

## 2020-08-03 DIAGNOSIS — C01 Malignant neoplasm of base of tongue: Secondary | ICD-10-CM

## 2020-08-03 DIAGNOSIS — Z7289 Other problems related to lifestyle: Secondary | ICD-10-CM | POA: Insufficient documentation

## 2020-08-03 DIAGNOSIS — C770 Secondary and unspecified malignant neoplasm of lymph nodes of head, face and neck: Secondary | ICD-10-CM | POA: Diagnosis not present

## 2020-08-03 DIAGNOSIS — Z87891 Personal history of nicotine dependence: Secondary | ICD-10-CM | POA: Insufficient documentation

## 2020-08-03 DIAGNOSIS — J449 Chronic obstructive pulmonary disease, unspecified: Secondary | ICD-10-CM | POA: Diagnosis not present

## 2020-08-03 DIAGNOSIS — Z833 Family history of diabetes mellitus: Secondary | ICD-10-CM | POA: Diagnosis not present

## 2020-08-03 DIAGNOSIS — C884 Extranodal marginal zone B-cell lymphoma of mucosa-associated lymphoid tissue [MALT-lymphoma]: Secondary | ICD-10-CM | POA: Insufficient documentation

## 2020-08-03 DIAGNOSIS — Z95828 Presence of other vascular implants and grafts: Secondary | ICD-10-CM

## 2020-08-03 DIAGNOSIS — Z8042 Family history of malignant neoplasm of prostate: Secondary | ICD-10-CM | POA: Insufficient documentation

## 2020-08-03 DIAGNOSIS — Z7189 Other specified counseling: Secondary | ICD-10-CM

## 2020-08-03 DIAGNOSIS — Z8249 Family history of ischemic heart disease and other diseases of the circulatory system: Secondary | ICD-10-CM | POA: Insufficient documentation

## 2020-08-03 DIAGNOSIS — Z5111 Encounter for antineoplastic chemotherapy: Secondary | ICD-10-CM | POA: Diagnosis not present

## 2020-08-03 DIAGNOSIS — Z8546 Personal history of malignant neoplasm of prostate: Secondary | ICD-10-CM | POA: Diagnosis not present

## 2020-08-03 LAB — CMP (CANCER CENTER ONLY)
ALT: 57 U/L — ABNORMAL HIGH (ref 0–44)
AST: 33 U/L (ref 15–41)
Albumin: 3.2 g/dL — ABNORMAL LOW (ref 3.5–5.0)
Alkaline Phosphatase: 81 U/L (ref 38–126)
Anion gap: 9 (ref 5–15)
BUN: 35 mg/dL — ABNORMAL HIGH (ref 8–23)
CO2: 25 mmol/L (ref 22–32)
Calcium: 8.7 mg/dL — ABNORMAL LOW (ref 8.9–10.3)
Chloride: 101 mmol/L (ref 98–111)
Creatinine: 0.99 mg/dL (ref 0.61–1.24)
GFR, Estimated: >60 mL/min (ref >60)
Glucose, Bld: 340 mg/dL — ABNORMAL HIGH (ref 70–99)
Potassium: 4.7 mmol/L (ref 3.5–5.1)
Sodium: 135 mmol/L (ref 135–145)
Total Bilirubin: 0.2 mg/dL — ABNORMAL LOW (ref 0.3–1.2)
Total Protein: 6.6 g/dL (ref 6.5–8.1)

## 2020-08-03 LAB — CBC WITH DIFFERENTIAL (CANCER CENTER ONLY)
Abs Immature Granulocytes: 0.04 10*3/uL (ref 0.00–0.07)
Basophils Absolute: 0.1 10*3/uL (ref 0.0–0.1)
Basophils Relative: 1 %
Eosinophils Absolute: 0.1 10*3/uL (ref 0.0–0.5)
Eosinophils Relative: 2 %
HCT: 35.7 % — ABNORMAL LOW (ref 39.0–52.0)
Hemoglobin: 12.1 g/dL — ABNORMAL LOW (ref 13.0–17.0)
Immature Granulocytes: 1 %
Lymphocytes Relative: 4 %
Lymphs Abs: 0.2 10*3/uL — ABNORMAL LOW (ref 0.7–4.0)
MCH: 30.6 pg (ref 26.0–34.0)
MCHC: 33.9 g/dL (ref 30.0–36.0)
MCV: 90.2 fL (ref 80.0–100.0)
Monocytes Absolute: 0.8 10*3/uL (ref 0.1–1.0)
Monocytes Relative: 16 %
Neutro Abs: 4 10*3/uL (ref 1.7–7.7)
Neutrophils Relative %: 76 %
Platelet Count: 192 10*3/uL (ref 150–400)
RBC: 3.96 MIL/uL — ABNORMAL LOW (ref 4.22–5.81)
RDW: 12.7 % (ref 11.5–15.5)
WBC Count: 5.2 10*3/uL (ref 4.0–10.5)
nRBC: 0 % (ref 0.0–0.2)

## 2020-08-03 LAB — LACTATE DEHYDROGENASE: LDH: 143 U/L (ref 98–192)

## 2020-08-03 LAB — MAGNESIUM: Magnesium: 2 mg/dL (ref 1.7–2.4)

## 2020-08-03 MED ORDER — PALONOSETRON HCL INJECTION 0.25 MG/5ML
0.2500 mg | Freq: Once | INTRAVENOUS | Status: AC
Start: 2020-08-03 — End: 2020-08-03
  Administered 2020-08-03: 0.25 mg via INTRAVENOUS

## 2020-08-03 MED ORDER — SODIUM CHLORIDE 0.9% FLUSH
10.0000 mL | Freq: Once | INTRAVENOUS | Status: AC
Start: 2020-08-03 — End: 2020-08-03
  Administered 2020-08-03: 10 mL
  Filled 2020-08-03: qty 10

## 2020-08-03 MED ORDER — DIPHENHYDRAMINE HCL 50 MG/ML IJ SOLN
INTRAMUSCULAR | Status: AC
  Filled 2020-08-03: qty 1

## 2020-08-03 MED ORDER — HEPARIN SOD (PORK) LOCK FLUSH 100 UNIT/ML IV SOLN
500.0000 [IU] | Freq: Once | INTRAVENOUS | Status: AC | PRN
  Administered 2020-08-03: 500 [IU]
  Filled 2020-08-03: qty 5

## 2020-08-03 MED ORDER — FAMOTIDINE IN NACL 20-0.9 MG/50ML-% IV SOLN
20.0000 mg | Freq: Once | INTRAVENOUS | Status: AC
Start: 2020-08-03 — End: 2020-08-03
  Administered 2020-08-03: 20 mg via INTRAVENOUS

## 2020-08-03 MED ORDER — CARBOPLATIN CHEMO INJECTION 450 MG/45ML
233.4000 mg | Freq: Once | INTRAVENOUS | Status: AC
  Administered 2020-08-03: 230 mg via INTRAVENOUS
  Filled 2020-08-03: qty 23

## 2020-08-03 MED ORDER — SODIUM CHLORIDE 0.9 % IV SOLN
50.0000 mg/m2 | Freq: Once | INTRAVENOUS | Status: AC
  Administered 2020-08-03: 102 mg via INTRAVENOUS
  Filled 2020-08-03: qty 17

## 2020-08-03 MED ORDER — SODIUM CHLORIDE 0.9 % IV SOLN
20.0000 mg | Freq: Once | INTRAVENOUS | Status: AC
  Administered 2020-08-03: 20 mg via INTRAVENOUS
  Filled 2020-08-03: qty 20

## 2020-08-03 MED ORDER — PALONOSETRON HCL INJECTION 0.25 MG/5ML
INTRAVENOUS | Status: AC
  Filled 2020-08-03: qty 5

## 2020-08-03 MED ORDER — DIPHENHYDRAMINE HCL 50 MG/ML IJ SOLN
50.0000 mg | Freq: Once | INTRAMUSCULAR | Status: AC
  Administered 2020-08-03: 50 mg via INTRAVENOUS

## 2020-08-03 MED ORDER — SODIUM CHLORIDE 0.9% FLUSH
10.0000 mL | INTRAVENOUS | Status: DC | PRN
  Administered 2020-08-03: 10 mL
  Filled 2020-08-03: qty 10

## 2020-08-03 MED ORDER — SODIUM CHLORIDE 0.9 % IV SOLN
Freq: Once | INTRAVENOUS | Status: AC
  Filled 2020-08-03: qty 250

## 2020-08-03 MED ORDER — FAMOTIDINE IN NACL 20-0.9 MG/50ML-% IV SOLN
INTRAVENOUS | Status: AC
  Filled 2020-08-03: qty 50

## 2020-08-03 NOTE — Patient Instructions (Signed)
[facility] Discharge Instructions for Patients Receiving Chemotherapy  Today you received the following chemotherapy agents: Paclitaxel (Taxol) and Carboplatin  To help prevent nausea and vomiting after your treatment, we encourage you to take your nausea medication  as prescribed.    If you develop nausea and vomiting that is not controlled by your nausea medication, call the clinic.   BELOW ARE SYMPTOMS THAT SHOULD BE REPORTED IMMEDIATELY:  *FEVER GREATER THAN 100.5 F  *CHILLS WITH OR WITHOUT FEVER  NAUSEA AND VOMITING THAT IS NOT CONTROLLED WITH YOUR NAUSEA MEDICATION  *UNUSUAL SHORTNESS OF BREATH  *UNUSUAL BRUISING OR BLEEDING  TENDERNESS IN MOUTH AND THROAT WITH OR WITHOUT PRESENCE OF ULCERS  *URINARY PROBLEMS  *BOWEL PROBLEMS  UNUSUAL RASH Items with * indicate a potential emergency and should be followed up as soon as possible.  Feel free to call the clinic should you have any questions or concerns. The clinic phone number is [phone].  Please show the CHEMO ALERT CARD at check-in to the Emergency Department and triage nurse.   

## 2020-08-03 NOTE — Progress Notes (Signed)
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 Pt is accompanied today by his sister. The pt reports that he is doing well overall.  The pt reports that he was a long-term smoker and due to that he has been a participant in a lung cancer screening program. While doing routine monitoring small lung nodules were first noticed in 2017. They did not feel that action needed to be taken at that time so they simply continued monitoring him. A significant change was noticed within the last 6 months. Pt denies any fevers, chills, night sweats, SOB during this time. Pt had a Thoracoscopy with Middle Lobectomy with Dr. Koleen Nimrod on 05/08/2019. He currently feels well and denies any of the aforementioned symptoms except an increase in SOB, noticed after surgery.   Pt quit smoking about 10 years ago. He was diagnosed with COPD in 2018 and given an inhaler. He did not notice a difference with the inhaler so he has since stopped using it. Pt has not had any issues with vertigo in sometime and his diabetes has been well controlled. Pt has a history of Prostate Cancer and had it resected in January of 2019. Pt also had a cholecystectomy, which they decided on due to activity seen on 2017 PET scan. He denies any issues  resulting from his gallbladder removal. Diabetes and heart disease runs in his family. His father had prostate cancer and heart disease as well.   Pt will return to work in Spring. There is dust and floating debris at his job but he is not around it very often. Pt has stayed up to date with his annual flu vaccine as well as his pneumonia vaccines.   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 Masslike area of ground-glass straddles the minor fissure with septal thickening and fissural retraction. While the lesion has increased in size minimally from 09/30/2018, there is clear enlargement from baseline examination on 02/16/2016. Therefore, lesion is characterized as worrisome for adenocarcinoma, Lung-RADS 4B, suspicious. Additional imaging evaluation or consultation with Pulmonology or Thoracic Surgery recommended. These results will be called to the ordering clinician or representative by the Radiologist Assistant, and communication documented in the PACS or zVision Dashboard. 2. Liver hemangiomas and left adrenal adenoma, best characterized on 03/03/2016. 3. Aortic atherosclerosis (ICD10-170.0). Coronary artery calcification."  Pt has had PET/CT ([id number]) completed on 02/25/2016 with results revealing "Dominant 8 mm right lower lobe pulmonary nodule shows no significant FDG uptake. Other scattered less than 5 mm bilateral pulmonary nodules also show no FDG uptake but are too small to characterize by PET. Recommend followup  by chest CT in 6 months. (please use the following order, "CT CHEST LCS NODULE FOLLOW-UP W/O CM") Right middle lobe ground-glass  opacity shows low-grade FDG uptake, and differential diagnosis includes inflammatory or infectious processes and low-grade adenocarcinoma. Recommend followup by chest CT in 6 months.  Multiple low-attenuation liver masses show no hypermetabolic activity compared to normal hepatic parenchyma. These remain indeterminate and cannot be characterized without IV contrast. Abdomen MRI without and with contrast recommended for further evaluation. 2.5 cm left adrenal mass shows mild FDG uptake, and remains indeterminate. Differential diagnosis includes atypical adenoma, metastasis, pheochromocytoma, or less likely adrenal cortical carcinoma. This can also be further evaluated with abdomen MRI without and with contrast."  Most recent lab results (05/12/2019) of CBC is as follows: all values are WNL except for RBC at 3.62, Hgb at 11.2, HCT at 34.2.  On review of systems, pt reports increased SOB and denies testicular pain/swelling, leg swelling, fevers, chills, night sweats and any other symptoms.   On PMHx the pt reports COPD with emphysema, Benign Positional Vertigo, Diabetes, Prostate Cancer, Prostatectomy (2019), Cholecystectomy. On Social Hx the pt reports he is a former smoker who quit 10 years ago On Family Hx the pt reports that his father had Prostate Cancer and Heart Disease, additional family history of Heart Disease and Diabetes.  INTERVAL HISTORY:   Jose Harvey is a wonderful 60 y.o. male who is here for evaluation and management of recently  diagnosed head and neck squamous cell carcinoma.  And cycle 4 of weekly carboplatin/Taxol concurrent with radiation. Patient notes no acute new symptoms from the chemotherapy.  Notes some mild soreness likely from radiation early radiation mucositis. No nausea or vomiting.  Notes his secretions are getting thicker. Notes changes and some erythema in his skin due to radiation over the neck. Labs are stable  On review of systems, pt reports change in taste  and denies n/v/d, changes in bowel habits, tingling in toes, leg swelling, fevers, chills, skin rashes and any other symptoms.  MEDICAL HISTORY:  Past Medical History:  Diagnosis Date  . Adrenal adenoma, left 03/2017   noted on CT  . Allergic rhinitis, cause unspecified   . Anxiety state, unspecified   . Aortic atherosclerosis ([place]) 03/2017   noted on CT  . BPV (benign positional vertigo)   . COPD (chronic obstructive pulmonary disease) with emphysema (HCC)    mild followed by [facility]   . Coronary artery disease   . Depressive disorder, not elsewhere classified   . Dyspnea    history of  . ED (erectile dysfunction)   . Gallbladder polyp 03/2016   noted on CT  . Hepatic hemangioma 03/2017   noted on CT  . History of kidney stones    hx of years ago   . Indigestion   . 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 Mixed hyperlipidemia   . Prostate cancer ([place])   . 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 Type I (juvenile type) diabetes mellitus without mention of complication, not stated as uncontrolled    type I - followed by Dr Buddy Duty   . Vesicoureteral reflux     SURGICAL HISTORY: Past Surgical History:  Procedure Laterality Date  . CHOLECYSTECTOMY N/A 05/17/2016   Procedure: LAPAROSCOPIC CHOLECYSTECTOMY;  Surgeon: Clovis Riley, MD;  Location: [facility];  Service: General;  Laterality: N/A;  . cyst removed from throat     . dental surgeries     .  IR GASTROSTOMY TUBE MOD SED  07/07/2020  . IR IMAGING GUIDED PORT INSERTION  07/07/2020  . LYMPHADENECTOMY Bilateral 07/02/2017   Procedure: BILATERAL LYMPHADENECTOMY;  Surgeon: Lucas Mallow, MD;  Location: [facility];  Service: Urology;  Laterality: Bilateral;  . PROSTATE BIOPSY    . ROBOT ASSISTED LAPAROSCOPIC RADICAL PROSTATECTOMY N/A 07/02/2017   Procedure: XI ROBOTIC ASSISTED LAPAROSCOPIC RADICAL PROSTATECTOMY;  Surgeon: Lucas Mallow, MD;  Location: [facility];  Service: Urology;  Laterality: N/A;  . TONSILLECTOMY    . VIDEO ASSISTED THORACOSCOPY (VATS)/ LOBECTOMY Right 05/08/2019   Procedure: Right VIDEO ASSISTED THORACOSCOPY with   MIDDLE LOBECTOMY and Enbloc portion of Upper Lobe with Node dissection, Intercostal Nerve Block;  Surgeon: Melrose Nakayama, MD;  Location: [place];  Service: Thoracic;  Laterality: Right;  . WISDOM TOOTH EXTRACTION      SOCIAL HISTORY: Social History   Socioeconomic History  . Marital status: Divorced    Spouse name: Not on file  . Number of children: 2  . Years of education: Not on file  . 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 Smoking status: Former Smoker    Packs/day: 1.50    Years: 27.00    Pack years: 40.50    Types: Cigarettes    Quit date: 06/05/2008    Years since quitting: 12.1  . Smokeless tobacco: Former Systems developer    Types: Snuff    Quit date: 2015  Vaping Use  . Vaping Use: Never used  Substance and Sexual Activity  . Alcohol use: Yes    Alcohol/week: 6.0 standard drinks    Types: 6 Cans of beer per week    Comment: occasional 6pk per week  . Drug use: No  . Sexual activity: Not Currently  Other Topics Concern  . Not on file  Social History Narrative   Patient divorced. Patient has two grown daughters but neither lives locally.   Social Determinants of Health   Financial Resource Strain: Low Risk   . Difficulty of Paying Living Expenses: Not very hard  Food Insecurity: No Food Insecurity  . Worried About Charity fundraiser in the Last Year: Never true  . Ran Out of Food in the Last Year: Never true  Transportation Needs: No Transportation Needs  . Lack of Transportation (Medical): No  . Lack of Transportation (Non-Medical): No  Physical Activity: Not on file  Stress: No Stress Concern Present  . Feeling of Stress : Only a little  Social Connections: Unknown  . Frequency of  Communication with Friends and Family: More than three times a week  . Frequency of Social Gatherings with Friends and Family: More than three times a week  . Attends Religious Services: More than 4 times per year  . Active Member of Clubs or Organizations: Yes  . Attends Archivist Meetings: More than 4 times per year  . Marital Status: Not on file  Intimate Partner Violence: Not on file    FAMILY HISTORY: Family History  Problem Relation Age of Onset  . Diabetes type I Father   . Heart attack Father   . Hypertension Father   . Emphysema Father   . CAD Father   . Colon cancer Maternal Grandfather   . Emphysema Paternal Grandfather   . Diabetes type I Paternal Grandfather   . Kidney disease Neg Hx   . Liver disease Neg Hx   .  Breast cancer Neg Hx   . Prostate cancer Neg Hx     ALLERGIES:  is allergic to crestor [rosuvastatin], lipitor [atorvastatin], other, and zocor [simvastatin].  MEDICATIONS:  Current Outpatient Medications  Medication Sig Dispense Refill  . lidocaine (XYLOCAINE) 2 % solution Patient: Mix 1part 2% viscous lidocaine, 1part H20. Swish & swallow 70mL of diluted mixture, 1min before meals and at bedtime, up to QID 200 mL 4  . aspirin 81 MG tablet Take 1 tablet (81 mg total) by mouth daily. 30 tablet   . cetirizine (ZYRTEC) 10 MG tablet Take 10 mg by mouth daily.    . chlorhexidine (PERIDEX) 0.12 % solution Use as directed 15 mLs in the mouth or throat daily.    . Coenzyme Q10 (COQ10) 100 MG CAPS Take 100 mg by mouth daily.    . Continuous Blood Gluc Sensor (FREESTYLE LIBRE 14 DAY SENSOR) MISC AS DIRECTED CHANGE SENSOR EVERY 14 DAYS 28    . dexamethasone (DECADRON) 4 MG tablet Take 2 tablets (8 mg total) by mouth daily. Start the day after chemotherapy for 2 days. 30 tablet 1  . ibuprofen (ADVIL) 200 MG tablet Take 400-800 mg by mouth every 6 (six) hours as needed for moderate pain. (Patient not taking: Reported on 06/17/2020)    . Insulin Human  (INSULIN PUMP) SOLN Inject into the skin continuous. NOVOLOG 100 UNIT/ML VIAL    . lidocaine-prilocaine (EMLA) cream Apply to affected area once 30 g 3  . lisinopril (PRINIVIL,ZESTRIL) 5 MG tablet Take 5 mg by mouth daily.     Marland Kitchen LORazepam (ATIVAN) 0.5 MG tablet Take 1 tablet (0.5 mg total) by mouth every 6 (six) hours as needed (Nausea or vomiting). 30 tablet 0  . Multiple Vitamins-Minerals (ICAPS AREDS 2) CAPS Take 1 capsule by mouth daily.    Marland Kitchen NOVOLOG 100 UNIT/ML injection Averages about 30 units per day via insulin pump    . Nutritional Supplements (FEEDING SUPPLEMENT, GLUCERNA 1.5 CAL,) LIQD Give 2 cartons at 8am, noon and 4pm. Give 1 carton at 8pm. Flush with 19ml of water before and after.  Drink or give additional water via tube of 236ml 3 times per day.  Meets 100% of needs.  Send bolus supplies. 1659 mL 5  . ondansetron (ZOFRAN) 8 MG tablet Take 1 tablet (8 mg total) by mouth 2 (two) times daily as needed for refractory nausea / vomiting. Start on day 3 after chemo. 30 tablet 1  . pantoprazole (PROTONIX) 40 MG tablet Take 40 mg by mouth daily.    . prochlorperazine (COMPAZINE) 10 MG tablet Take 1 tablet (10 mg total) by mouth every 6 (six) hours as needed (Nausea or vomiting). 30 tablet 1  . rosuvastatin (CRESTOR) 5 MG tablet Take 1 tablet (5 mg total) by mouth daily. 90 tablet 3  . sertraline (ZOLOFT) 100 MG tablet Take 50 mg by mouth daily.  0  . sucralfate (CARAFATE) 1 g tablet Take 1 tablet (1 g total) by mouth 4 (four) times daily -  with meals and at bedtime. 90 tablet 1   No current facility-administered medications for this visit.    REVIEW OF SYSTEMS:   10 Point review of Systems was done is negative except as noted above.   PHYSICAL EXAMINATION: ECOG PERFORMANCE STATUS: 0 - Asymptomatic  VS reviewed Exam was given in bed in infusion.  GENERAL:alert, in no acute distress and comfortable SKIN: no acute rashes, no significant lesions EYES: conjunctiva are pink and  non-injected, sclera anicteric OROPHARYNX:  MMM, no exudates, no oropharyngeal erythema or ulceration NECK: supple, no JVD LYMPH:  no palpable lymphadenopathy in the cervical, axillary or inguinal regions LUNGS: clear to auscultation b/l with normal respiratory effort HEART: regular rate & rhythm ABDOMEN:  normoactive bowel sounds , non tender, not distended. Extremity: no pedal edema PSYCH: alert & oriented x 3 with fluent speech NEURO: no focal motor/sensory deficits  LABORATORY DATA:  I have reviewed the data as listed  . CBC Latest Ref Rng & Units 07/26/2020 07/20/2020  WBC 4.0 - 10.5 K/uL 6.2 6.7  Hemoglobin 13.0 - 17.0 g/dL 13.2 13.2  Hematocrit 39.0 - 52.0 % 38.4(L) 38.2(L)  Platelets 150 - 400 K/uL 273 235    CMP Latest Ref Rng & Units 07/26/2020 07/20/2020  Glucose 70 - 99 mg/dL 157(H) 258(H)  BUN 8 - 23 mg/dL 23 21  Creatinine 0.61 - 1.24 mg/dL 0.92 0.98  Sodium 135 - 145 mmol/L 134(L) 132(L)  Potassium 3.5 - 5.1 mmol/L 4.1 4.6  Chloride 98 - 111 mmol/L 100 99  CO2 22 - 32 mmol/L 25 26  Calcium 8.9 - 10.3 mg/dL 9.2 9.0  Total Protein 6.5 - 8.1 g/dL 7.2 6.9  Total Bilirubin 0.3 - 1.2 mg/dL 0.3 0.3  Alkaline Phos 38 - 126 U/L 85 77  AST 15 - 41 U/L 22 20  ALT 0 - 44 U/L 28 29   05/26/2020 Right Cervical LN Bx ([id number]):   05/08/2019 Surgical Pathology [phone]):    05/08/2019 Surgical Pathology [phone]):    RADIOGRAPHIC STUDIES: I have personally reviewed the radiological images as listed and agreed with the findings in the report. IR Gastrostomy Tube  Result Date: 07/07/2020 INDICATION: 60 year old male with recently diagnosed head neck cancer requiring percutaneous enteric access. EXAM: PERC PLACEMENT GASTROSTOMY MEDICATIONS: 2 mg Ancef, intravenous; Antibiotics were administered within 1 hour of the procedure. ANESTHESIA/SEDATION: Versed 3 mg IV; Fentanyl 0 mcg IV Moderate Sedation Time:  20 The patient was continuously monitored during the  procedure by the interventional radiology nurse under my direct supervision. CONTRAST:  13mL OMNIPAQUE IOHEXOL 300 MG/ML SOLN - administered into the gastric lumen. FLUOROSCOPY TIME:  Fluoroscopy Time: 0 minutes 42 seconds (8 mGy). COMPLICATIONS: None immediate. PROCEDURE: Informed written consent was obtained from the patient after a thorough discussion of the procedural risks, benefits and alternatives. All questions were addressed. Maximal Sterile Barrier Technique was utilized including caps, mask, sterile gowns, sterile gloves, sterile drape, hand hygiene and skin antiseptic. A timeout was performed prior to the initiation of the procedure. The patient was placed on the procedure table in the supine position. Pre-procedure abdominal film confirmed visualization of the transverse colon. An angled 5-French catheter was passed through the nares into the stomach. The patient was prepped and draped in usual sterile fashion. The stomach was insufflated with air via the indwelling nasogastric tube. Under fluoroscopy, a puncture site was selected and local analgesia achieved with 1% lidocaine infiltrated subcutaneously. Under fluoroscopic guidance, a gastropexy needle was passed into the stomach and the T-bar suture was released. Entry into the stomach was confirmed with fluoroscopy, aspiration of air, and injection of contrast material. This was repeated with an additional gastropexy suture (for a total of 2 fasteners). At the center of these gastropexy sutures, a dermatotomy was performed. An 18 gauge needle was passed into the stomach at the site of this dermatotomy, and position within the gastric lumen again confirmed under fluoroscopy using aspiration of air and contrast injection. An Amplatz guidewire was passed through this  needle and intraluminal placement within the stomach was confirmed by fluoroscopy. The needle was removed. Over the guidewire, the percutaneous tract was dilated using a 10 mm non-compliant  balloon. The balloon was deflated, then pushed into the gastric lumen followed in concert by the 20 Fr gastrostomy tube. The retention balloon of the percutaneous gastrostomy tube was inflated with 20 mL of sterile water. The tube was withdrawn until the retention balloon was at the edge of the gastric lumen. The external bumper was brought to the abdominal wall. Contrast was injected through the gastrostomy tube, confirming intraluminal positioning. The patient tolerated the procedure well without any immediate post-procedural complications. IMPRESSION: Technically successful placement of 20 Fr gastrostomy tube. Absorbable gastropexy sutures do not require additional appointment for removal. Ruthann Cancer, MD [facility] Radiology Electronically Signed   By: Ruthann Cancer MD   On: 07/07/2020 12:12   IR IMAGING GUIDED PORT INSERTION  Result Date: 07/07/2020 INDICATION: 60 year old male with newly diagnosed head neck cancer requiring central venous access for chemotherapy. EXAM: IMPLANTED PORT A CATH PLACEMENT WITH ULTRASOUND AND FLUOROSCOPIC GUIDANCE COMPARISON:  None. MEDICATIONS: Ancef 2 gm IV; The antibiotic was administered within an appropriate time interval prior to skin puncture. ANESTHESIA/SEDATION: Moderate (conscious) sedation was employed during this procedure. A total of Versed 3 mg and Fentanyl 100 mcg was administered intravenously. Moderate Sedation Time: 17 minutes. [name] level of consciousness and vital signs were monitored continuously by radiology nursing throughout the procedure under my direct supervision. CONTRAST:  None FLUOROSCOPY TIME:  0 minutes, 6 seconds (1 mGy) COMPLICATIONS: None immediate. PROCEDURE: The procedure, risks, benefits, and alternatives were explained to the patient. Questions regarding the procedure were encouraged and answered. The patient understands and consents to the procedure. The right neck and chest were  prepped with chlorhexidine in a sterile fashion, and a sterile drape was applied covering the operative field. Maximum barrier sterile technique with sterile gowns and gloves were used for the procedure. A timeout was performed prior to the initiation of the procedure. Ultrasound was used to examine the jugular vein which was compressible and free of internal echoes. A skin marker was used to demarcate the planned venotomy and port pocket incision sites. Local anesthesia was provided to these sites and the subcutaneous tunnel track with 1% lidocaine with 1:100,000 epinephrine. A small incision was created at the jugular access site and blunt dissection was performed of the subcutaneous tissues. Under real time ultrasound guidance, the jugular vein was accessed with a 21 ga micropuncture needle and an 0.018" wire was inserted to the superior vena cava. A 5 Fr micopuncture set was then used, through which a 0.035" Rosen wire was passed under fluoroscopic guidance into the inferior vena cava. An 8 Fr dilator was then placed over the wire. A subcutaneous port pocket was then created along the upper chest wall utilizing a combination of sharp and blunt dissection. The pocket was irrigated with sterile saline, packed with gauze, and observed for hemorrhage. A single lumen "ISP" sized power injectable port was chosen for placement. The 8 Fr catheter was tunneled from the port pocket site to the venotomy incision. The port was placed in the pocket. The external catheter was trimmed to appropriate length. The dilator was exchanged for an 8 Fr peel-away sheath under fluoroscopic guidance. The catheter was then placed through the sheath and the sheath was removed. Final catheter positioning was confirmed and documented with a fluoroscopic spot radiograph. The port was accessed with  a Huber needle, aspirated, and flushed with heparinized saline. The deep dermal layer of the port pocket incision was closed with interrupted 3-0  Vicryl suture. Dermabond was then placed over the port pocket and neck incisions. The patient tolerated the procedure well without immediate post procedural complication. FINDINGS: After catheter placement, the tip lies within the cavoatrial junction. The catheter aspirates and flushes normally and is ready for immediate use. IMPRESSION: Successful placement of a power injectable Port-A-Cath via the right internal jugular vein. The catheter is ready for immediate use. 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 Patient is appropriate to pursue-Fourth weekly dose of carboplatin Taxol. Ordered use of sucralfate to help with his radiation mucositis  -Counseled extensively on continued smoking cessation and avoiding alcohol use. -Continue to optimize nutrition through feeding tube. -Notes that he is trying to stay well-hydrated.   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 Masslike area of ground-glass straddles the minor fissure with septal thickening and fissural retraction. While the lesion has increased in size minimally from 09/30/2018, there is clear enlargement from baseline examination on 02/16/2016. Therefore, lesion is characterized as worrisome for adenocarcinoma, Lung-RADS 4B, suspicious. -02/25/2016 PET/CT ([id number]) revealed "Dominant 8 mm right lower lobe pulmonary nodule shows no significant FDG uptake. Other scattered less than 5 mm bilateral pulmonary nodules also show no FDG uptake but are too small to characterize by PET. Right middle lobe ground-glass opacity shows low-grade FDG uptake, and differential diagnosis includes inflammatory or infectious processes and low-grade adenocarcinoma. Multiple low-attenuation liver masses show no hypermetabolic activity compared to normal hepatic parenchyma. These remain indeterminate and cannot be characterized without IV contrast. Abdomen MRI without and with contrast recommended for further evaluation. 2.5 cm left adrenal mass shows mild FDG uptake, and remains indeterminate. Differential diagnosis includes atypical adenoma, metastasis, pheochromocytoma, or less likely adrenal cortical carcinoma."  PLAN: -Discussed pt labwork today, 07/13/2020; blood counts, chemistries, Magnesium normal. --Will see back in 2 weeks w labs.Advised pt that skin irritation and sore throat can occur due to treatment.  -Advised pt he is tolerating the treatment very well.  -Continue Vitamin B Complex daily. -Advised pt the swelling in neck is less prominent and more reduced since last examination. -Advise pt to eat well, drink 48-64 oz water daily, and stay active. -Recommend pt take Multivitamin daily. -Sucralfate 3-4 times a day as needed to help with radiation mucositis.  FOLLOW UP: Please schedule next 2 cycles of weekly  carboplatin and Taxol with port flush and labs. Next MD visit and 2 weeks with cycle 6 of treatment.   The total time spent in the appointment was 30 minutes and more than 50% was on counseling and direct patient cares, ordering and management of .Marland Kitchen  All of the patient's questions were answered with apparent satisfaction. The patient knows to call the clinic with any problems, questions or concerns.   Sullivan Lone MD [place] AAHIVMS [facility] Hematology/Oncology Physician [facility]  (Office):       [phone] (Work cell):  [phone] (Fax):           [phone]  08/03/2020 11:36 PM  I, Reinaldo Raddle, am acting as scribe for Dr. Sullivan Lone, MD.     .I have reviewed the above documentation for accuracy and completeness, and I agree with the above. Brunetta Genera MD

## 2020-08-03 NOTE — Progress Notes (Signed)
Nutrition follow-up completed with patient receiving concurrent chemoradiation therapy for tongue cancer. Weight documented as 169.4 pounds on February 28 stable from 170.4 pounds February 21. Patient denies nausea, vomiting, constipation, diarrhea. Reports drinking water by mouth but not really eating any foods or other liquids. He estimates he drinks 3-16 ounce bottles of water daily. He is tolerating between 6 and 7 cartons of Glucerna 1.5 daily.  He denies problems with this.  He notes that originally he was sent Glucerna 1.2 instead of Glucerna 1.5  (substitution ).  Reports that he now has Glucerna 1.5. He has not required additional protein powder. Reports he feels well.  Estimated nutrition needs: 2500-2900 cal, 125-145 g protein, 2.5 L fluid.  Nutrition diagnosis: Inadequate energy intake improved.  Intervention: Continue Glucerna 1.5, 6-7 cartons daily.  If patient can tolerate 7 cartons of Glucerna 1.5 daily with an additional 240 mL free water flushes orally or via tube this will provide 2492 cal, 137 g protein, 2460 mL of free water or 100% estimated nutrition needs. Encourage patient to continue water by mouth.  Monitoring, evaluation, goals: Patient will tolerate adequate calories and protein for weight maintenance.  Next visit: Tuesday, March 8 after radiation therapy.  **Disclaimer: This note was dictated with voice recognition software. Similar sounding words can inadvertently be transcribed and this note may contain transcription errors which may not have been corrected upon publication of note.**

## 2020-08-04 ENCOUNTER — Ambulatory Visit: Payer: BC Managed Care – PPO

## 2020-08-04 ENCOUNTER — Other Ambulatory Visit: Payer: Self-pay

## 2020-08-04 ENCOUNTER — Ambulatory Visit
Admission: RE | Admit: 2020-08-04 | Discharge: 2020-08-04 | Disposition: A | Discharge status: Home / Self Care | Payer: BC Managed Care – PPO | Source: Ambulatory Visit | Attending: Radiation Oncology | Admitting: Radiation Oncology

## 2020-08-04 DIAGNOSIS — Z794 Long term (current) use of insulin: Secondary | ICD-10-CM | POA: Diagnosis not present

## 2020-08-04 DIAGNOSIS — C01 Malignant neoplasm of base of tongue: Secondary | ICD-10-CM | POA: Diagnosis not present

## 2020-08-04 DIAGNOSIS — Z87891 Personal history of nicotine dependence: Secondary | ICD-10-CM | POA: Diagnosis not present

## 2020-08-04 DIAGNOSIS — Z9641 Presence of insulin pump (external) (internal): Secondary | ICD-10-CM | POA: Diagnosis not present

## 2020-08-04 DIAGNOSIS — E103293 Type 1 diabetes mellitus with mild nonproliferative diabetic retinopathy without macular edema, bilateral: Secondary | ICD-10-CM | POA: Diagnosis not present

## 2020-08-04 DIAGNOSIS — C770 Secondary and unspecified malignant neoplasm of lymph nodes of head, face and neck: Secondary | ICD-10-CM | POA: Diagnosis not present

## 2020-08-04 DIAGNOSIS — E1065 Type 1 diabetes mellitus with hyperglycemia: Secondary | ICD-10-CM | POA: Diagnosis not present

## 2020-08-05 ENCOUNTER — Ambulatory Visit: Payer: BC Managed Care – PPO

## 2020-08-05 ENCOUNTER — Ambulatory Visit: Payer: BC Managed Care – PPO | Attending: Radiation Oncology

## 2020-08-05 ENCOUNTER — Ambulatory Visit
Admission: RE | Admit: 2020-08-05 | Discharge: 2020-08-05 | Disposition: A | Discharge status: Home / Self Care | Payer: BC Managed Care – PPO | Source: Ambulatory Visit | Attending: Radiation Oncology | Admitting: Radiation Oncology

## 2020-08-05 DIAGNOSIS — R293 Abnormal posture: Secondary | ICD-10-CM | POA: Diagnosis not present

## 2020-08-05 DIAGNOSIS — Z87891 Personal history of nicotine dependence: Secondary | ICD-10-CM | POA: Diagnosis not present

## 2020-08-05 DIAGNOSIS — R1310 Dysphagia, unspecified: Secondary | ICD-10-CM | POA: Insufficient documentation

## 2020-08-05 DIAGNOSIS — C01 Malignant neoplasm of base of tongue: Secondary | ICD-10-CM | POA: Diagnosis not present

## 2020-08-05 DIAGNOSIS — C770 Secondary and unspecified malignant neoplasm of lymph nodes of head, face and neck: Secondary | ICD-10-CM | POA: Diagnosis not present

## 2020-08-05 NOTE — Therapy (Signed)
[facility] [address] [place], [place], [address] Phone: [phone]   Fax:  [phone]  Speech Language Pathology Treatment  Patient Details  Name: Jose Harvey MRN: [id number] Date of Birth: 06-May-1961 Referring Provider (SLP): Eppie Gibson, MD   Encounter Date: 08/05/2020   End of Session - 08/05/20 1509    Visit Number 2    Number of Visits 7    Date for SLP Re-Evaluation 10/06/20    SLP Start Time 5    SLP Stop Time  1445    SLP Time Calculation (min) 36 min    Activity Tolerance Patient tolerated treatment well           Past Medical History:  Diagnosis Date  . Adrenal adenoma, left 03/2017   noted on CT  . Allergic rhinitis, cause unspecified   . Anxiety state, unspecified   . Aortic atherosclerosis ([place]) 03/2017   noted on CT  . BPV (benign positional vertigo)   . COPD (chronic obstructive pulmonary disease) with emphysema (HCC)    mild followed by [facility]   . Coronary artery disease   . Depressive disorder, not elsewhere classified   . Dyspnea    history of  . ED (erectile dysfunction)   . Gallbladder polyp 03/2016   noted on CT  . Hepatic hemangioma 03/2017   noted on CT  . History of kidney stones    hx of years ago   . Indigestion   . 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 Mixed hyperlipidemia   . Prostate cancer ([place])   . 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 Type I (juvenile type) diabetes mellitus without mention of complication, not stated as uncontrolled    type I - followed by Dr Buddy Duty   . Vesicoureteral reflux     Past Surgical History:  Procedure Laterality Date  . CHOLECYSTECTOMY N/A 05/17/2016   Procedure: LAPAROSCOPIC CHOLECYSTECTOMY;  Surgeon: Clovis Riley, MD;  Location: [facility];  Service: General;  Laterality: N/A;  . cyst removed from throat     . dental  surgeries     . IR GASTROSTOMY TUBE MOD SED  07/07/2020  . IR IMAGING GUIDED PORT INSERTION  07/07/2020  . LYMPHADENECTOMY Bilateral 07/02/2017   Procedure: BILATERAL LYMPHADENECTOMY;  Surgeon: Lucas Mallow, MD;  Location: [facility];  Service: Urology;  Laterality: Bilateral;  . PROSTATE BIOPSY    . ROBOT ASSISTED LAPAROSCOPIC RADICAL PROSTATECTOMY N/A 07/02/2017   Procedure: XI ROBOTIC ASSISTED LAPAROSCOPIC RADICAL PROSTATECTOMY;  Surgeon: Lucas Mallow, MD;  Location: [facility];  Service: Urology;  Laterality: N/A;  . TONSILLECTOMY    . VIDEO ASSISTED THORACOSCOPY (VATS)/ LOBECTOMY Right 05/08/2019   Procedure: Right VIDEO ASSISTED THORACOSCOPY with   MIDDLE LOBECTOMY and [place] portion of Upper Lobe with Node dissection, Intercostal Nerve Block;  Surgeon: Melrose Nakayama, MD;  Location: [facility];  Service: Thoracic;  Laterality: Right;  . WISDOM TOOTH EXTRACTION      There were no vitals filed for this visit.   Subjective Assessment - 08/05/20 1414    Currently in Pain? Yes    Pain Score 3     Pain Location Throat   when swallowing   Pain Orientation Right    Pain Descriptors / Indicators Sore    Pain Onset 1 to 4 weeks ago    Aggravating Factors  swallowing  ADULT SLP TREATMENT - 08/05/20 1417      General Information   Behavior/Cognition Alert;Cooperative;Pleasant mood      Treatment Provided   Treatment provided Dysphagia      Dysphagia Treatment   Temperature Spikes Noted No    Respiratory Status Room air    Patient observed directly with PO's Yes    Type of PO's observed Dysphagia 1 (puree);Thin liquids    Other treatment/comments "i'm doing the deep swallow." SLP reiterated to pt to do scope of HEP and meet frequency for HEP - pt req'd total A for rationale for HEP. Pt independent by session end re: rationale. Pt's procedure with HEP was independent except for super supraglottic, pt req'd mod a initialy but then was indpendent by session end.       Assessment / Recommendations / Plan   Plan Continue with current plan of care            SLP Education - 08/05/20 1507    Education Details late effects of radiation on swallowing, HEP procedure for super supraglottic    Person(s) Educated Patient    Methods Explanation;Demonstration;Verbal cues;Handout    Comprehension Verbalized understanding;Returned demonstration;Verbal cues required;Need further instruction            SLP Short Term Goals - 08/05/20 1436      SLP SHORT TERM GOAL #1   Title Pt will complete HEP with rare min A over two sessions    Time 2    Period --   visits, for all STGs   Status On-going      SLP SHORT TERM GOAL #2   Title Pt will tell SLP rationale for HEP completion over two sessions    Time 2    Status On-going      SLP SHORT TERM GOAL #3   Title Pt will tell SLP how a food journal can hasten/facilitate return to more normalized diet    Time 2    Status On-going            SLP Long Term Goals - 08/05/20 1507      SLP LONG TERM GOAL #1   Title Pt will complete HEP with modified independence over 3 visits    Time 3    Status On-going      SLP LONG TERM GOAL #2   Title Pt will tell SLP 3 overt s/s aspiration PNA with modified independence    Time 2    Status On-going      SLP LONG TERM GOAL #3   Title Pt will tell SLP when to decr frequency of HEP    Time 5    Status On-going            Plan - 08/05/20 1509    Clinical Impression Statement Jose Harvey presents today with WNL/WFL swallowing ability with dys I and thin liquids.  No overt s/sx oral or pharyngeal deficits, or of aspiration PNA reported or observed today. Data suggests that as pts progress through rad or chemorad therapy that their swallowing ability will decrease. 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 SLP reviewed Jose Harvey' indivdualized exercise program to mitigate/eliminate pt's muscle fibrosis  following and as a result of, pt's rad tx. Skilled ST would cont to be beneficial to the pt in order to regularly assess pt's safety with POs and/or need for instrumental swallow assessment, as well as to assess accurate completion of swallowing HEP.  Speech Therapy Frequency --   once every approx 4 weeks   Duration --   7 total visits   Treatment/Interventions Aspiration precaution training;Pharyngeal strengthening exercises;Diet toleration management by SLP;Trials of upgraded texture/liquids;Internal/external aids;Patient/family education;Compensatory strategies;SLP instruction and feedback    Potential to Achieve Goals Good    SLP Home Exercise Plan provided today    Consulted and Agree with Plan of Care Patient           Patient will benefit from skilled therapeutic intervention in order to improve the following deficits and impairments:   Dysphagia, unspecified type    Problem List Patient Active Problem List   Diagnosis Date Noted  . Port-A-Cath in place 07/13/2020  . Counseling regarding advance care planning and goals of care 06/29/2020  . Cancer of base of tongue ([place]) 06/11/2020  . S/P thoracotomy 05/08/2019  . Prostate cancer ([place]) 07/02/2017  . Malignant neoplasm of prostate ([place]) 06/06/2017  . DOE (dyspnea on exertion) 07/28/2014  . COPD (chronic obstructive pulmonary disease) ([place]) 01/30/2014  . Throat fullness 01/30/2014  . Paroxysmal nocturnal dyspnea 11/17/2013  . Hyperlipidemia 11/17/2013  . Insulin dependent diabetes mellitus 11/17/2013  . Essential hypertension 11/17/2013    [facility]  08/05/2020, 3:11 PM  [place] [address] [place], [place], [address] Phone: [phone]   Fax:  [phone]   Name: Jose Harvey MRN: [id number] Date of Birth: 1961-04-13

## 2020-08-06 ENCOUNTER — Other Ambulatory Visit: Payer: Self-pay | Admitting: *Deleted

## 2020-08-06 ENCOUNTER — Ambulatory Visit: Payer: BC Managed Care – PPO

## 2020-08-06 ENCOUNTER — Ambulatory Visit
Admission: RE | Admit: 2020-08-06 | Discharge: 2020-08-06 | Disposition: A | Discharge status: Home / Self Care | Payer: BC Managed Care – PPO | Source: Ambulatory Visit | Attending: Radiation Oncology | Admitting: Radiation Oncology

## 2020-08-06 DIAGNOSIS — C01 Malignant neoplasm of base of tongue: Secondary | ICD-10-CM

## 2020-08-06 DIAGNOSIS — Z87891 Personal history of nicotine dependence: Secondary | ICD-10-CM | POA: Diagnosis not present

## 2020-08-06 DIAGNOSIS — C770 Secondary and unspecified malignant neoplasm of lymph nodes of head, face and neck: Secondary | ICD-10-CM | POA: Diagnosis not present

## 2020-08-06 IMAGING — DX DG CHEST 1V PORT
1 series · 1 of 1 positions shown · non-contrast
Comparison: 05/08/2019

CLINICAL DATA: Postop VATS procedure and middle lobe lobectomy and
partial right upper lobe lobectomy.

EXAM:
PORTABLE CHEST 1 VIEW

[chest ap]
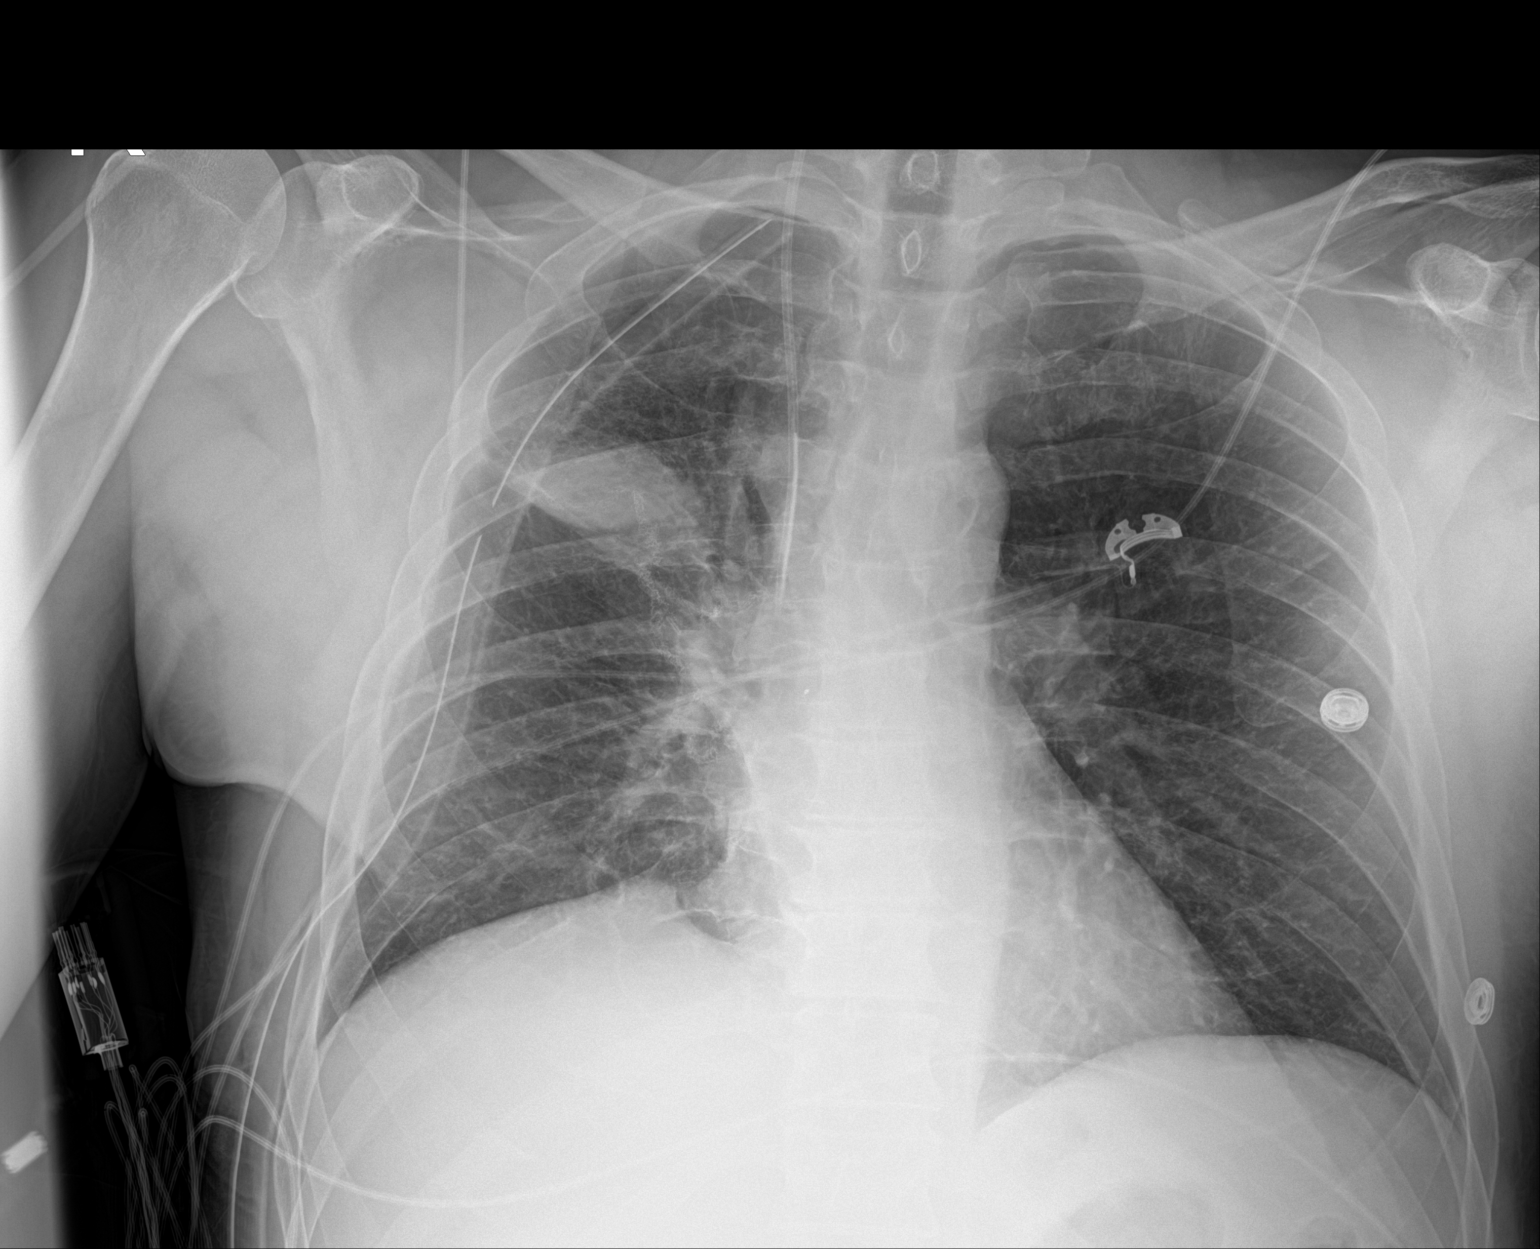

[1 of 1 positions shown; findings below may reference images not displayed]

FINDINGS: The right chest tube is stable. No definite pneumothorax. The right
IJ catheter is in good position, unchanged.

The heart is normal in size. The mediastinal and hilar contours are
normal.

Stable surgical changes involving the right hilar area with staple
lines.

Small postoperative hematoma or fluid collection in the right upper
lobe. The left lung is clear.
IMPRESSION: 1. Stable postoperative changes with a small hematoma or fluid
collection adjacent to the staple line in the right upper lobe.
2. Support apparatus in good position, unchanged.  No pneumothorax.

## 2020-08-07 IMAGING — DX DG CHEST 1V PORT
1 series · 1 of 1 positions shown · non-contrast
Comparison: 05/09/2019

CLINICAL DATA: Follow-up pneumothorax.

EXAM:
PORTABLE CHEST 1 VIEW

[chest ap]
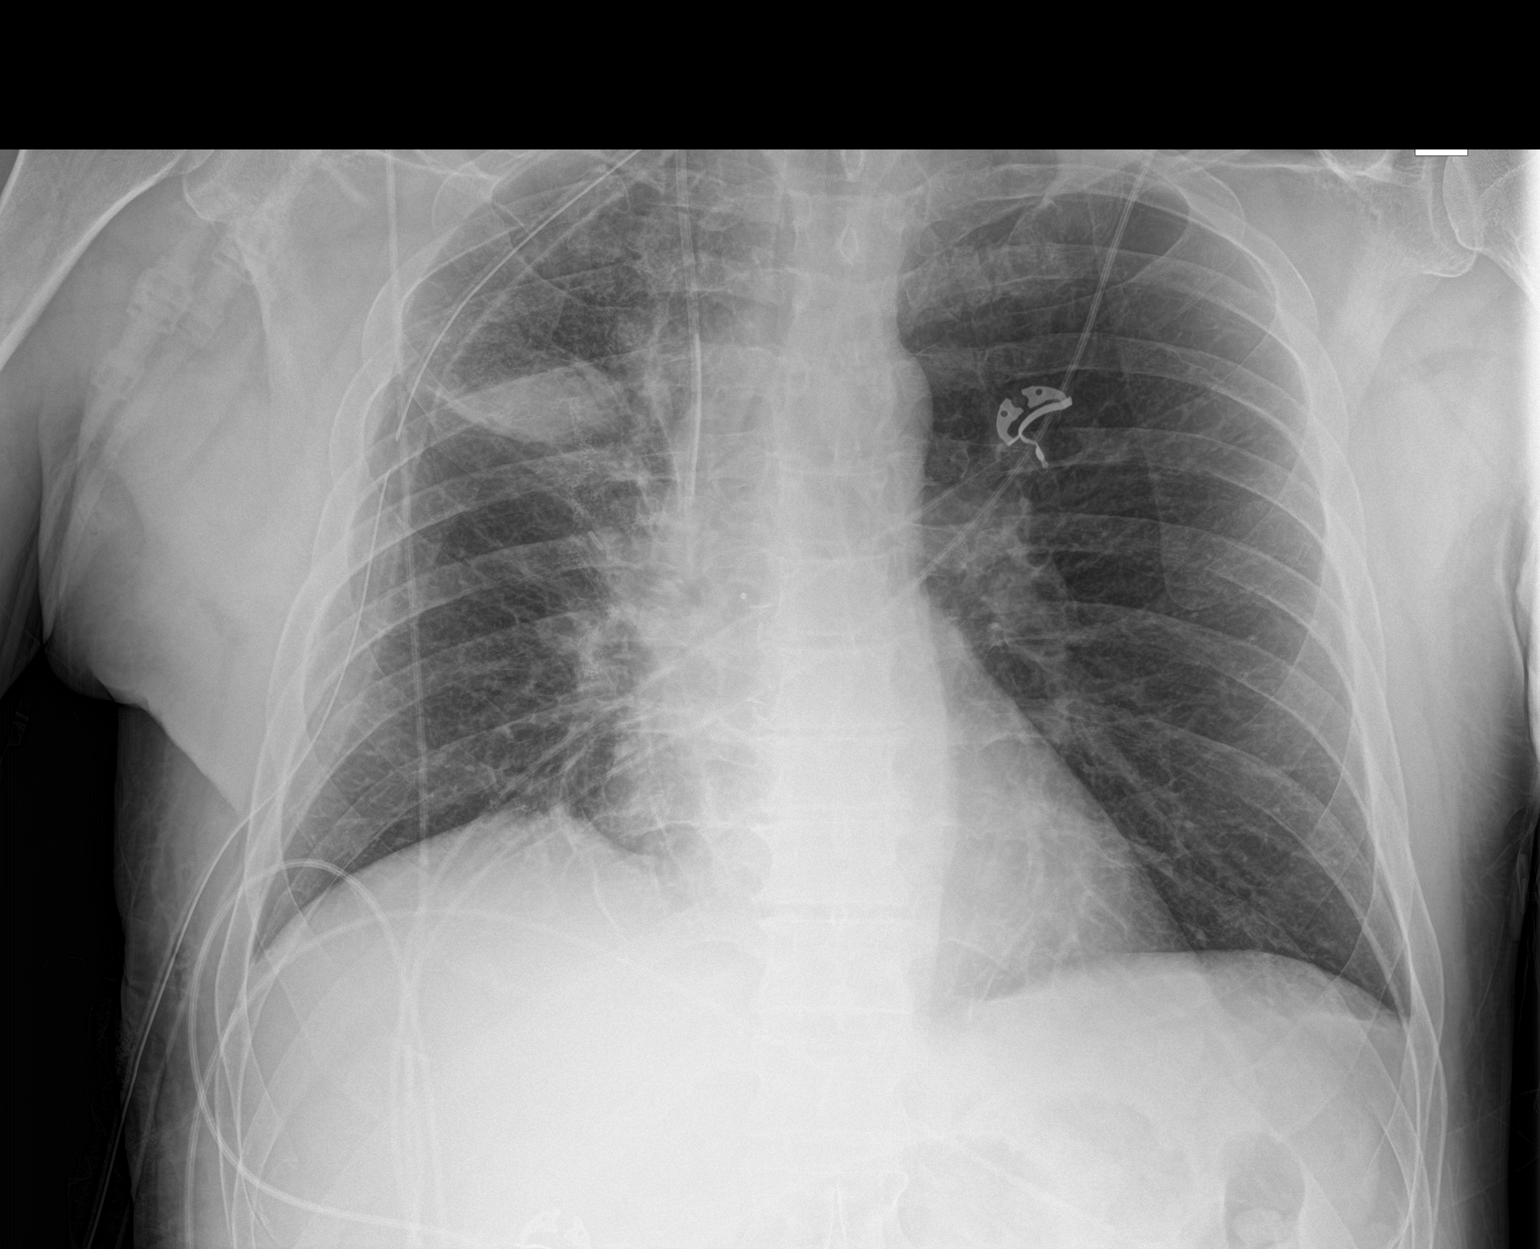

[1 of 1 positions shown; findings below may reference images not displayed]

FINDINGS: Right IJ catheter tip projects over the SVC. Right-sided chest tube
is in place. No appreciable pneumothorax identified. Stable
postoperative changes involving the right lung. Right upper lobe
opacity measuring 4.2 cm is unchanged from previous exam. Left lung
clear.
IMPRESSION: 1. Left chest tube in place, no pneumothorax identified.
2. No change in right upper lung opacity.

## 2020-08-08 IMAGING — DX DG CHEST 1V PORT
1 series · 1 of 1 positions shown · non-contrast
Comparison: Portable exam 8999 hours compared to 05/30/2019

CLINICAL DATA: Postop

EXAM:
PORTABLE CHEST 1 VIEW

[chest ap]
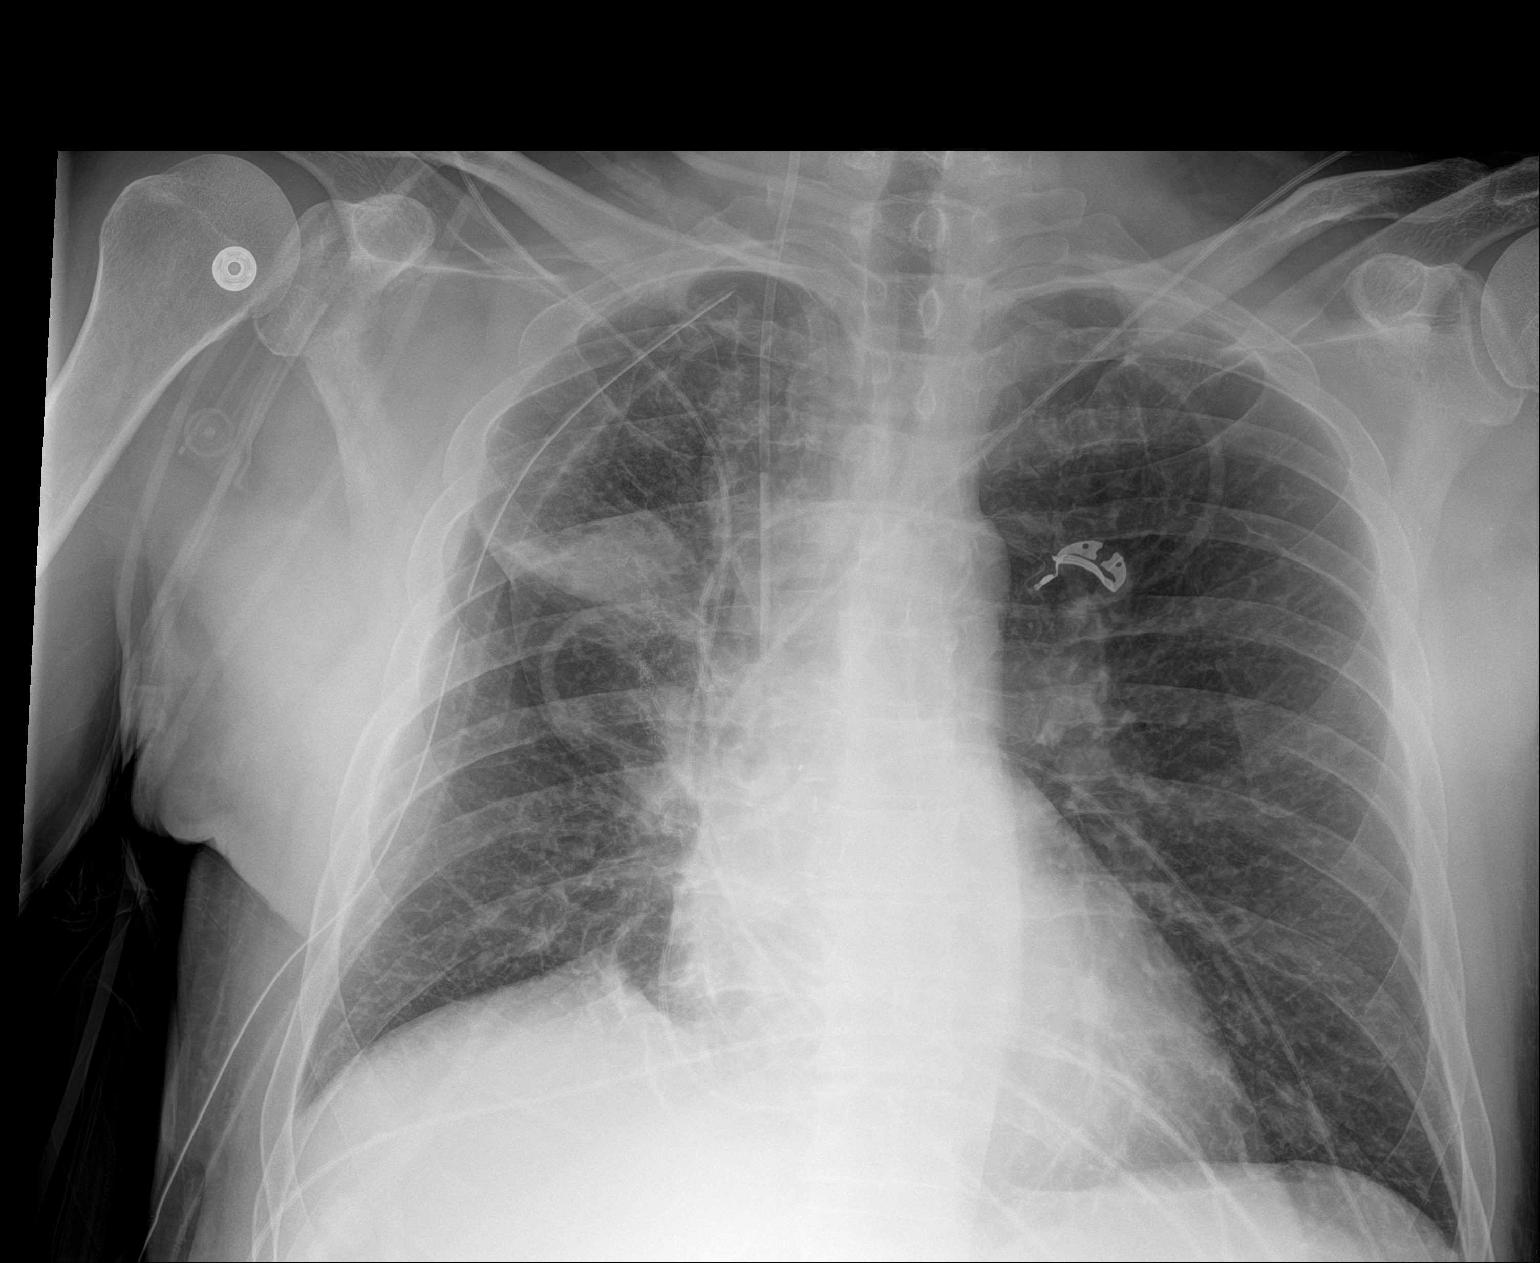

[1 of 1 positions shown; findings below may reference images not displayed]

FINDINGS: RIGHT thoracostomy tube stable.

RIGHT jugular central venous catheter with tip projecting over SVC.

Normal heart size, mediastinal contours, and pulmonary vascularity.

RIGHT perihilar staple line with atelectasis at RIGHT base.

Ovoid opacity again identified in RIGHT upper lobe suspected
adjacent to minor fissure, stable.

Underlying emphysematous changes.

No pleural effusion or pneumothorax.
IMPRESSION: Stable postoperative changes RIGHT lung.

RIGHT thoracostomy tube without pneumothorax.

## 2020-08-09 ENCOUNTER — Ambulatory Visit: Payer: BC Managed Care – PPO

## 2020-08-09 ENCOUNTER — Other Ambulatory Visit: Payer: BC Managed Care – PPO

## 2020-08-09 ENCOUNTER — Ambulatory Visit
Admission: RE | Admit: 2020-08-09 | Discharge: 2020-08-09 | Disposition: A | Discharge status: Home / Self Care | Payer: BC Managed Care – PPO | Source: Ambulatory Visit | Attending: Radiation Oncology | Admitting: Radiation Oncology

## 2020-08-09 ENCOUNTER — Ambulatory Visit: Payer: BC Managed Care – PPO | Admitting: Hematology

## 2020-08-09 DIAGNOSIS — C01 Malignant neoplasm of base of tongue: Secondary | ICD-10-CM | POA: Diagnosis not present

## 2020-08-09 DIAGNOSIS — Z87891 Personal history of nicotine dependence: Secondary | ICD-10-CM | POA: Diagnosis not present

## 2020-08-09 DIAGNOSIS — C770 Secondary and unspecified malignant neoplasm of lymph nodes of head, face and neck: Secondary | ICD-10-CM | POA: Diagnosis not present

## 2020-08-10 ENCOUNTER — Inpatient Hospital Stay: Payer: BC Managed Care – PPO

## 2020-08-10 ENCOUNTER — Other Ambulatory Visit: Payer: Self-pay

## 2020-08-10 ENCOUNTER — Inpatient Hospital Stay: Payer: BC Managed Care – PPO | Admitting: Nutrition

## 2020-08-10 ENCOUNTER — Ambulatory Visit
Admission: RE | Admit: 2020-08-10 | Discharge: 2020-08-10 | Disposition: A | Discharge status: Home / Self Care | Payer: BC Managed Care – PPO | Source: Ambulatory Visit | Attending: Radiation Oncology | Admitting: Radiation Oncology

## 2020-08-10 ENCOUNTER — Ambulatory Visit: Payer: BC Managed Care – PPO

## 2020-08-10 ENCOUNTER — Inpatient Hospital Stay (HOSPITAL_BASED_OUTPATIENT_CLINIC_OR_DEPARTMENT_OTHER): Payer: BC Managed Care – PPO | Admitting: Hematology

## 2020-08-10 VITALS — BP 117/64 | HR 96 | Temp 98.7°F | Resp 18 | Wt 168.8 lb

## 2020-08-10 DIAGNOSIS — Z5111 Encounter for antineoplastic chemotherapy: Secondary | ICD-10-CM | POA: Diagnosis not present

## 2020-08-10 DIAGNOSIS — Z836 Family history of other diseases of the respiratory system: Secondary | ICD-10-CM | POA: Diagnosis not present

## 2020-08-10 DIAGNOSIS — Z7189 Other specified counseling: Secondary | ICD-10-CM

## 2020-08-10 DIAGNOSIS — C770 Secondary and unspecified malignant neoplasm of lymph nodes of head, face and neck: Secondary | ICD-10-CM | POA: Diagnosis not present

## 2020-08-10 DIAGNOSIS — Z8249 Family history of ischemic heart disease and other diseases of the circulatory system: Secondary | ICD-10-CM | POA: Diagnosis not present

## 2020-08-10 DIAGNOSIS — Z8042 Family history of malignant neoplasm of prostate: Secondary | ICD-10-CM | POA: Diagnosis not present

## 2020-08-10 DIAGNOSIS — Z833 Family history of diabetes mellitus: Secondary | ICD-10-CM | POA: Diagnosis not present

## 2020-08-10 DIAGNOSIS — Z8546 Personal history of malignant neoplasm of prostate: Secondary | ICD-10-CM | POA: Diagnosis not present

## 2020-08-10 DIAGNOSIS — C884 Extranodal marginal zone B-cell lymphoma of mucosa-associated lymphoid tissue [MALT-lymphoma]: Secondary | ICD-10-CM | POA: Diagnosis not present

## 2020-08-10 DIAGNOSIS — C01 Malignant neoplasm of base of tongue: Secondary | ICD-10-CM | POA: Diagnosis not present

## 2020-08-10 DIAGNOSIS — Z95828 Presence of other vascular implants and grafts: Secondary | ICD-10-CM

## 2020-08-10 DIAGNOSIS — Z800 Family history of malignant neoplasm of digestive organs: Secondary | ICD-10-CM | POA: Diagnosis not present

## 2020-08-10 DIAGNOSIS — I700 Atherosclerosis of aorta: Secondary | ICD-10-CM | POA: Diagnosis not present

## 2020-08-10 DIAGNOSIS — J449 Chronic obstructive pulmonary disease, unspecified: Secondary | ICD-10-CM | POA: Diagnosis not present

## 2020-08-10 DIAGNOSIS — Z87891 Personal history of nicotine dependence: Secondary | ICD-10-CM | POA: Diagnosis not present

## 2020-08-10 DIAGNOSIS — Z79899 Other long term (current) drug therapy: Secondary | ICD-10-CM | POA: Diagnosis not present

## 2020-08-10 DIAGNOSIS — Z7289 Other problems related to lifestyle: Secondary | ICD-10-CM | POA: Diagnosis not present

## 2020-08-10 LAB — CMP (CANCER CENTER ONLY)
ALT: 52 U/L — ABNORMAL HIGH (ref 0–44)
AST: 30 U/L (ref 15–41)
Albumin: 3.1 g/dL — ABNORMAL LOW (ref 3.5–5.0)
Alkaline Phosphatase: 87 U/L (ref 38–126)
Anion gap: 8 (ref 5–15)
BUN: 36 mg/dL — ABNORMAL HIGH (ref 8–23)
CO2: 26 mmol/L (ref 22–32)
Calcium: 8.9 mg/dL (ref 8.9–10.3)
Chloride: 99 mmol/L (ref 98–111)
Creatinine: 0.84 mg/dL (ref 0.61–1.24)
GFR, Estimated: >60 mL/min (ref >60)
Glucose, Bld: 277 mg/dL — ABNORMAL HIGH (ref 70–99)
Potassium: 4.4 mmol/L (ref 3.5–5.1)
Sodium: 133 mmol/L — ABNORMAL LOW (ref 135–145)
Total Bilirubin: 0.2 mg/dL — ABNORMAL LOW (ref 0.3–1.2)
Total Protein: 6.6 g/dL (ref 6.5–8.1)

## 2020-08-10 LAB — CBC WITH DIFFERENTIAL (CANCER CENTER ONLY)
Abs Immature Granulocytes: 0.03 10*3/uL (ref 0.00–0.07)
Basophils Absolute: 0 10*3/uL (ref 0.0–0.1)
Basophils Relative: 1 %
Eosinophils Absolute: 0.1 10*3/uL (ref 0.0–0.5)
Eosinophils Relative: 3 %
HCT: 35.3 % — ABNORMAL LOW (ref 39.0–52.0)
Hemoglobin: 12.2 g/dL — ABNORMAL LOW (ref 13.0–17.0)
Immature Granulocytes: 1 %
Lymphocytes Relative: 4 %
Lymphs Abs: 0.2 10*3/uL — ABNORMAL LOW (ref 0.7–4.0)
MCH: 30.7 pg (ref 26.0–34.0)
MCHC: 34.6 g/dL (ref 30.0–36.0)
MCV: 88.7 fL (ref 80.0–100.0)
Monocytes Absolute: 0.6 10*3/uL (ref 0.1–1.0)
Monocytes Relative: 14 %
Neutro Abs: 3.3 10*3/uL (ref 1.7–7.7)
Neutrophils Relative %: 77 %
Platelet Count: 159 10*3/uL (ref 150–400)
RBC: 3.98 MIL/uL — ABNORMAL LOW (ref 4.22–5.81)
RDW: 12.9 % (ref 11.5–15.5)
WBC Count: 4.3 10*3/uL (ref 4.0–10.5)
nRBC: 0 % (ref 0.0–0.2)

## 2020-08-10 LAB — MAGNESIUM: Magnesium: 2.1 mg/dL (ref 1.7–2.4)

## 2020-08-10 LAB — LACTATE DEHYDROGENASE: LDH: 399 U/L — ABNORMAL HIGH (ref 98–192)

## 2020-08-10 MED ORDER — SODIUM CHLORIDE 0.9 % IV SOLN
20.0000 mg | Freq: Once | INTRAVENOUS | Status: AC
  Administered 2020-08-10: 20 mg via INTRAVENOUS
  Filled 2020-08-10: qty 20

## 2020-08-10 MED ORDER — SODIUM CHLORIDE 0.9 % IV SOLN
Freq: Once | INTRAVENOUS | Status: AC
  Filled 2020-08-10: qty 250

## 2020-08-10 MED ORDER — PALONOSETRON HCL INJECTION 0.25 MG/5ML
INTRAVENOUS | Status: AC
  Filled 2020-08-10: qty 5

## 2020-08-10 MED ORDER — FAMOTIDINE IN NACL 20-0.9 MG/50ML-% IV SOLN
20.0000 mg | Freq: Once | INTRAVENOUS | Status: AC
  Administered 2020-08-10: 20 mg via INTRAVENOUS

## 2020-08-10 MED ORDER — HEPARIN SOD (PORK) LOCK FLUSH 100 UNIT/ML IV SOLN
500.0000 [IU] | Freq: Once | INTRAVENOUS | Status: AC | PRN
  Administered 2020-08-10: 500 [IU]
  Filled 2020-08-10: qty 5

## 2020-08-10 MED ORDER — SODIUM CHLORIDE 0.9% FLUSH
10.0000 mL | Freq: Once | INTRAVENOUS | Status: AC
  Administered 2020-08-10: 10 mL
  Filled 2020-08-10: qty 10

## 2020-08-10 MED ORDER — SODIUM CHLORIDE 0.9% FLUSH
10.0000 mL | INTRAVENOUS | Status: DC | PRN
Start: 2020-08-10 — End: 2020-08-10
  Administered 2020-08-10: 10 mL
  Filled 2020-08-10: qty 10

## 2020-08-10 MED ORDER — SODIUM CHLORIDE 0.9 % IV SOLN
50.0000 mg/m2 | Freq: Once | INTRAVENOUS | Status: AC
  Administered 2020-08-10: 102 mg via INTRAVENOUS
  Filled 2020-08-10: qty 17

## 2020-08-10 MED ORDER — SODIUM CHLORIDE 0.9 % IV SOLN
250.0000 mg | Freq: Once | INTRAVENOUS | Status: AC
  Administered 2020-08-10: 250 mg via INTRAVENOUS
  Filled 2020-08-10: qty 25

## 2020-08-10 MED ORDER — DIPHENHYDRAMINE HCL 50 MG/ML IJ SOLN
50.0000 mg | Freq: Once | INTRAMUSCULAR | Status: AC
  Administered 2020-08-10: 50 mg via INTRAVENOUS

## 2020-08-10 MED ORDER — DIPHENHYDRAMINE HCL 50 MG/ML IJ SOLN
INTRAMUSCULAR | Status: AC
  Filled 2020-08-10: qty 1

## 2020-08-10 MED ORDER — FAMOTIDINE IN NACL 20-0.9 MG/50ML-% IV SOLN
INTRAVENOUS | Status: AC
  Filled 2020-08-10: qty 50

## 2020-08-10 MED ORDER — PALONOSETRON HCL INJECTION 0.25 MG/5ML
0.2500 mg | Freq: Once | INTRAVENOUS | Status: AC
  Administered 2020-08-10: 0.25 mg via INTRAVENOUS

## 2020-08-10 NOTE — Progress Notes (Signed)
Nutrition follow-up completed with patient receiving concurrent chemoradiation therapy for tongue cancer. Weight documented as 168 pounds down slightly from 169.4 pounds February 28. Patient continues to deny nausea, vomiting, constipation, and diarrhea. Patient is infusing 2 cartons of Glucerna 1.5 - 3 times daily with 60 mL free water before and after bolus feedings.  Reports concern blood sugars are still over 200 most of the time.  Patient not comfortable increasing Glucerna 1.5 secondary to elevated blood sugars. 6 cartons Glucerna 1.5 provides 2136 cal, 117.6 g protein, 1080 mL free water and 189 g carbohydrate.  Estimated nutrition needs: 2500-2900 cal, 125-145 g protein, 2.5 L fluid.  Nutrition diagnosis: Inadequate energy intake continues.  Intervention: Continue Glucerna 1.5, 6 cartons daily split into 3 feedings. Add 1 packet of protein powder mixed with 30 mL free water to each feeding to provide an additional 75 cal and 18 g protein.  Samples were provided. Continue 1440 mL free water by mouth or via tube between feedings. This provides 2211 cal, 135.6 g protein, 2880 mL free water.  Monitoring, evaluation, goals: We will continue to monitor tolerance, weight, and blood sugars.  Next visit: Tuesday, March 15 after radiation therapy.  **Disclaimer: This note was dictated with voice recognition software. Similar sounding words can inadvertently be transcribed and this note may contain transcription errors which may not have been corrected upon publication of note.**

## 2020-08-10 NOTE — Progress Notes (Signed)
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 Pt is accompanied today by his sister. The pt reports that he is doing well overall.  The pt reports that he was a long-term smoker and due to that he has been a participant in a lung cancer screening program. While doing routine monitoring small lung nodules were first noticed in 2017. They did not feel that action needed to be taken at that time so they simply continued monitoring him. A significant change was noticed within the last 6 months. Pt denies any fevers, chills, night sweats, SOB during this time. Pt had a Thoracoscopy with Middle Lobectomy with Dr. Koleen Nimrod on 05/08/2019. He currently feels well and denies any of the aforementioned symptoms except an increase in SOB, noticed after surgery.   Pt quit smoking about 10 years ago. He was diagnosed with COPD in 2018 and given an inhaler. He did not notice a difference with the inhaler so he has since stopped using it. Pt has not had any issues with vertigo in sometime and his diabetes has been well controlled. Pt has a history of Prostate Cancer and had it resected in January of 2019. Pt also had a cholecystectomy, which they decided on due to activity seen on 2017 PET scan. He denies any issues  resulting from his gallbladder removal. Diabetes and heart disease runs in his family. His father had prostate cancer and heart disease as well.   Pt will return to work in Spring. There is dust and floating debris at his job but he is not around it very often. Pt has stayed up to date with his annual flu vaccine as well as his pneumonia vaccines.   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 Masslike area of ground-glass straddles the minor fissure with septal thickening and fissural retraction. While the lesion has increased in size minimally from 09/30/2018, there is clear enlargement from baseline examination on 02/16/2016. Therefore, lesion is characterized as worrisome for adenocarcinoma, Lung-RADS 4B, suspicious. Additional imaging evaluation or consultation with Pulmonology or Thoracic Surgery recommended. These results will be called to the ordering clinician or representative by the Radiologist Assistant, and communication documented in the PACS or zVision Dashboard. 2. Liver hemangiomas and left adrenal adenoma, best characterized on 03/03/2016. 3. Aortic atherosclerosis (ICD10-170.0). Coronary artery calcification."  Pt has had PET/CT ([id number]) completed on 02/25/2016 with results revealing "Dominant 8 mm right lower lobe pulmonary nodule shows no significant FDG uptake. Other scattered less than 5 mm bilateral pulmonary nodules also show no FDG uptake but are too small to characterize by PET. Recommend followup  by chest CT in 6 months. (please use the following order, "CT CHEST LCS NODULE FOLLOW-UP W/O CM") Right middle lobe ground-glass  opacity shows low-grade FDG uptake, and differential diagnosis includes inflammatory or infectious processes and low-grade adenocarcinoma. Recommend followup by chest CT in 6 months.  Multiple low-attenuation liver masses show no hypermetabolic activity compared to normal hepatic parenchyma. These remain indeterminate and cannot be characterized without IV contrast. Abdomen MRI without and with contrast recommended for further evaluation. 2.5 cm left adrenal mass shows mild FDG uptake, and remains indeterminate. Differential diagnosis includes atypical adenoma, metastasis, pheochromocytoma, or less likely adrenal cortical carcinoma. This can also be further evaluated with abdomen MRI without and with contrast."  Most recent lab results (05/12/2019) of CBC is as follows: all values are WNL except for RBC at 3.62, Hgb at 11.2, HCT at 34.2.  On review of systems, pt reports increased SOB and denies testicular pain/swelling, leg swelling, fevers, chills, night sweats and any other symptoms.   On PMHx the pt reports COPD with emphysema, Benign Positional Vertigo, Diabetes, Prostate Cancer, Prostatectomy (2019), Cholecystectomy. On Social Hx the pt reports he is a former smoker who quit 10 years ago On Family Hx the pt reports that his father had Prostate Cancer and Heart Disease, additional family history of Heart Disease and Diabetes.  INTERVAL HISTORY:   Jose Harvey is a wonderful 60 y.o. male who is here for evaluation and management of recently diagnosed head and neck squamous cell carcinoma. [name] last visit with [place] was on 07/26/2020. The pt reports that he is doing well overall.   The pt reports no new symptoms or concerns. He notes his last dose of radiation is March 18th. He has not been eating much, but denies throat being worse continuously. He has been using the tube for 100% food intake. The pt notes he does still swallow water and has no issues with coughing or regurgitation when  doing so. The pt is using wound care and triple antibiotic ointment on neck. The pt notes he was also given a cream by radiation and is using that. He notes the mass on his neck has almost completely shrunk to his touch and in his opinion. The pt notes he has not had to use much pain medication and his throat has been more reminiscent of a sore throat as opposed to causing him consistent, major pain and bothersome.   Lab results today 08/10/2020 of CBC w/diff and CMP is as follows: all values are WNL except for RBC of 3.98, Hgb of 12.2, HCT of 35.3, Lymphs Abs of 0.2K, Sodium of 133, Glucose of 277, BUN of 36, Albumin of 3.1, ALT of 52, Total Bilirubin of 0.2. 08/10/2020 [place] of 2.1. 08/10/2020 LDH of 399.  On review of systems, pt reports decreased appetite, sore throat and denies new skin rashes, tingling/numbness in hands/feet, continued diarrhea/constipation, fevers, chills, night sweats, SOB, chest pain, port issues, abdominal pain, leg swelling, swallowing issues, and any other symptoms.  MEDICAL HISTORY:  Past Medical History:  Diagnosis Date  . Adrenal adenoma, left 03/2017   noted on CT  . Allergic rhinitis, cause unspecified   . Anxiety state, unspecified   . Aortic atherosclerosis ([place]) 03/2017   noted on CT  . BPV (benign positional vertigo)   . COPD (chronic obstructive pulmonary disease) with emphysema (HCC)    mild followed by [facility]   . Coronary artery disease   . Depressive disorder, not elsewhere classified   .  Dyspnea    history of  . ED (erectile dysfunction)   . Gallbladder polyp 03/2016   noted on CT  . Hepatic hemangioma 03/2017   noted on CT  . History of kidney stones    hx of years ago   . Indigestion   . 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 Mixed hyperlipidemia   . Prostate cancer ([place])   . 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 Type I (juvenile  type) diabetes mellitus without mention of complication, not stated as uncontrolled    type I - followed by Dr Buddy Duty   . Vesicoureteral reflux     SURGICAL HISTORY: Past Surgical History:  Procedure Laterality Date  . CHOLECYSTECTOMY N/A 05/17/2016   Procedure: LAPAROSCOPIC CHOLECYSTECTOMY;  Surgeon: Clovis Riley, MD;  Location: [facility];  Service: General;  Laterality: N/A;  . cyst removed from throat     . dental surgeries     . IR GASTROSTOMY TUBE MOD SED  07/07/2020  . IR IMAGING GUIDED PORT INSERTION  07/07/2020  . LYMPHADENECTOMY Bilateral 07/02/2017   Procedure: BILATERAL LYMPHADENECTOMY;  Surgeon: Lucas Mallow, MD;  Location: [facility];  Service: Urology;  Laterality: Bilateral;  . PROSTATE BIOPSY    . ROBOT ASSISTED LAPAROSCOPIC RADICAL PROSTATECTOMY N/A 07/02/2017   Procedure: XI ROBOTIC ASSISTED LAPAROSCOPIC RADICAL PROSTATECTOMY;  Surgeon: Lucas Mallow, MD;  Location: [facility];  Service: Urology;  Laterality: N/A;  . TONSILLECTOMY    . VIDEO ASSISTED THORACOSCOPY (VATS)/ LOBECTOMY Right 05/08/2019   Procedure: Right VIDEO ASSISTED THORACOSCOPY with   MIDDLE LOBECTOMY and Enbloc portion of Upper Lobe with Node dissection, Intercostal Nerve Block;  Surgeon: Melrose Nakayama, MD;  Location: [facility];  Service: Thoracic;  Laterality: Right;  . WISDOM TOOTH EXTRACTION      SOCIAL HISTORY: Social History   Socioeconomic History  . Marital status: Divorced    Spouse name: Not on file  . Number of children: 2  . Years of education: Not on file  . 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 Smoking status: Former Smoker    Packs/day: 1.50    Years: 27.00    Pack years: 40.50    Types: Cigarettes    Quit date: 06/05/2008    Years since quitting: 12.1  . Smokeless tobacco: Former Systems developer    Types: Snuff    Quit date: 2015  Vaping Use  . Vaping Use: Never used  Substance and Sexual Activity  . Alcohol use:  Yes    Alcohol/week: 6.0 standard drinks    Types: 6 Cans of beer per week    Comment: occasional 6pk per week  . Drug use: No  . Sexual activity: Not Currently  Other Topics Concern  . Not on file  Social History Narrative   Patient divorced. Patient has two grown daughters but neither lives locally.   Social Determinants of Health   Financial Resource Strain: Low Risk   . Difficulty of Paying Living Expenses: Not very hard  Food Insecurity: No Food Insecurity  . Worried About Charity fundraiser in the Last Year: Never true  . Ran Out of Food in the Last Year: Never true  Transportation Needs: No Transportation Needs  . Lack of Transportation (Medical): No  . Lack of Transportation (Non-Medical): No  Physical Activity: Not on file  Stress: No  Stress Concern Present  . Feeling of Stress : Only a little  Social Connections: Unknown  . Frequency of Communication with Friends and Family: More than three times a week  . Frequency of Social Gatherings with Friends and Family: More than three times a week  . Attends Religious Services: More than 4 times per year  . Active Member of Clubs or Organizations: Yes  . Attends Archivist Meetings: More than 4 times per year  . Marital Status: Not on file  Intimate Partner Violence: Not on file    FAMILY HISTORY: Family History  Problem Relation Age of Onset  . Diabetes type I Father   . Heart attack Father   . Hypertension Father   . Emphysema Father   . CAD Father   . Colon cancer Maternal Grandfather   . Emphysema Paternal Grandfather   . Diabetes type I Paternal Grandfather   . Kidney disease Neg Hx   . Liver disease Neg Hx   . Breast cancer Neg Hx   . Prostate cancer Neg Hx     ALLERGIES:  is allergic to crestor [rosuvastatin], lipitor [atorvastatin], other, and zocor [simvastatin].  MEDICATIONS:  Current Outpatient Medications  Medication Sig Dispense Refill  . lidocaine (XYLOCAINE) 2 % solution Patient: Mix  1part 2% viscous lidocaine, 1part H20. Swish & swallow 36mL of diluted mixture, 43min before meals and at bedtime, up to QID 200 mL 4  . aspirin 81 MG tablet Take 1 tablet (81 mg total) by mouth daily. 30 tablet   . cetirizine (ZYRTEC) 10 MG tablet Take 10 mg by mouth daily.    . chlorhexidine (PERIDEX) 0.12 % solution Use as directed 15 mLs in the mouth or throat daily.    . Coenzyme Q10 (COQ10) 100 MG CAPS Take 100 mg by mouth daily.    . Continuous Blood Gluc Sensor (FREESTYLE LIBRE 14 DAY SENSOR) MISC AS DIRECTED CHANGE SENSOR EVERY 14 DAYS 28    . dexamethasone (DECADRON) 4 MG tablet Take 2 tablets (8 mg total) by mouth daily. Start the day after chemotherapy for 2 days. 30 tablet 1  . ibuprofen (ADVIL) 200 MG tablet Take 400-800 mg by mouth every 6 (six) hours as needed for moderate pain. (Patient not taking: Reported on 06/17/2020)    . Insulin Human (INSULIN PUMP) SOLN Inject into the skin continuous. NOVOLOG 100 UNIT/ML VIAL    . lidocaine-prilocaine (EMLA) cream Apply to affected area once 30 g 3  . lisinopril (PRINIVIL,ZESTRIL) 5 MG tablet Take 5 mg by mouth daily.     Marland Kitchen LORazepam (ATIVAN) 0.5 MG tablet Take 1 tablet (0.5 mg total) by mouth every 6 (six) hours as needed (Nausea or vomiting). 30 tablet 0  . Multiple Vitamins-Minerals (ICAPS AREDS 2) CAPS Take 1 capsule by mouth daily.    Marland Kitchen NOVOLOG 100 UNIT/ML injection Averages about 30 units per day via insulin pump    . Nutritional Supplements (FEEDING SUPPLEMENT, GLUCERNA 1.5 CAL,) LIQD Give 2 cartons at 8am, noon and 4pm. Give 1 carton at 8pm. Flush with 30ml of water before and after.  Drink or give additional water via tube of 27ml 3 times per day.  Meets 100% of needs.  Send bolus supplies. 1659 mL 5  . ondansetron (ZOFRAN) 8 MG tablet Take 1 tablet (8 mg total) by mouth 2 (two) times daily as needed for refractory nausea / vomiting. Start on day 3 after chemo. 30 tablet 1  . pantoprazole (PROTONIX) 40 MG tablet Take  40 mg by mouth  daily.    . prochlorperazine (COMPAZINE) 10 MG tablet Take 1 tablet (10 mg total) by mouth every 6 (six) hours as needed (Nausea or vomiting). 30 tablet 1  . rosuvastatin (CRESTOR) 5 MG tablet Take 1 tablet (5 mg total) by mouth daily. 90 tablet 3  . sertraline (ZOLOFT) 100 MG tablet Take 50 mg by mouth daily.  0  . sucralfate (CARAFATE) 1 g tablet Take 1 tablet (1 g total) by mouth 4 (four) times daily -  with meals and at bedtime. 90 tablet 1   No current facility-administered medications for this visit.   Facility-Administered Medications Ordered in Other Visits  Medication Dose Route Frequency Provider Last Rate Last Admin  . CARBOplatin (PARAPLATIN) 250 mg in sodium chloride 0.9 % 250 mL chemo infusion  250 mg Intravenous Once Brunetta Genera, MD      . heparin lock flush 100 unit/mL  500 Units Intracatheter Once PRN Brunetta Genera, MD      . PACLitaxel (TAXOL) 102 mg in sodium chloride 0.9 % 250 mL chemo infusion (</= 80mg /m2)  50 mg/m2 (Treatment Plan Recorded) Intravenous Once Brunetta Genera, MD 267 mL/hr at 08/10/20 1148 102 mg at 08/10/20 1148  . sodium chloride flush (NS) 0.9 % injection 10 mL  10 mL Intracatheter PRN Brunetta Genera, MD        REVIEW OF SYSTEMS:   10 Point review of Systems was done is negative except as noted above.   PHYSICAL EXAMINATION: ECOG PERFORMANCE STATUS: 0 - Asymptomatic  Exam was given in infusion.  GENERAL:alert, in no acute distress and comfortable SKIN: no acute rashes, no significant lesions EYES: conjunctiva are pink and non-injected, sclera anicteric OROPHARYNX: MMM, no exudates, no oropharyngeal erythema or ulceration NECK: supple, no JVD LYMPH:  no palpable lymphadenopathy in the cervical, axillary or inguinal regions LUNGS: clear to auscultation b/l with normal respiratory effort HEART: regular rate & rhythm ABDOMEN:  normoactive bowel sounds , non tender, not distended. Extremity: no pedal edema PSYCH: alert &  oriented x 3 with fluent speech NEURO: no focal motor/sensory deficits  LABORATORY DATA:  I have reviewed the data as listed  . CBC Latest Ref Rng & Units 08/10/2020 08/03/2020 07/26/2020  WBC 4.0 - 10.5 K/uL 4.3 5.2 6.2  Hemoglobin 13.0 - 17.0 g/dL 12.2(L) 12.1(L) 13.2  Hematocrit 39.0 - 52.0 % 35.3(L) 35.7(L) 38.4(L)  Platelets 150 - 400 K/uL 159 192 273    . CMP Latest Ref Rng & Units 08/10/2020 08/03/2020 07/26/2020  Glucose 70 - 99 mg/dL 277(H) 340(H) 157(H)  BUN 8 - 23 mg/dL 36(H) 35(H) 23  Creatinine 0.61 - 1.24 mg/dL 0.84 0.99 0.92  Sodium 135 - 145 mmol/L 133(L) 135 134(L)  Potassium 3.5 - 5.1 mmol/L 4.4 4.7 4.1  Chloride 98 - 111 mmol/L 99 101 100  CO2 22 - 32 mmol/L 26 25 25   Calcium 8.9 - 10.3 mg/dL 8.9 8.7(L) 9.2  Total Protein 6.5 - 8.1 g/dL 6.6 6.6 7.2  Total Bilirubin 0.3 - 1.2 mg/dL 0.2(L) 0.2(L) 0.3  Alkaline Phos 38 - 126 U/L 87 81 85  AST 15 - 41 U/L 30 33 22  ALT 0 - 44 U/L 52(H) 57(H) 28     05/26/2020 Right Cervical LN Bx ([id number]):   05/08/2019 Surgical Pathology [phone]):    05/08/2019 Surgical Pathology [id number]):    RADIOGRAPHIC STUDIES: I have personally reviewed the radiological images as listed and agreed with the findings in the  report. No results found.  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 Patient is appropriate to pursue-Fourth weekly dose of carboplatin Taxol. Ordered use of sucralfate to help with his radiation mucositis   -Counseled extensively on continued smoking cessation and avoiding alcohol use. -Continue to optimize nutrition through feeding tube. -Notes that he is trying to stay well-hydrated.   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 Masslike area of ground-glass straddles the minor fissure with septal thickening and fissural retraction. While the lesion has increased in size minimally from 09/30/2018, there is clear enlargement from baseline examination on 02/16/2016. Therefore, lesion is characterized as worrisome for adenocarcinoma, Lung-RADS 4B, suspicious. -02/25/2016 PET/CT ([id number]) revealed "Dominant 8 mm right lower lobe pulmonary nodule shows no significant FDG uptake. Other scattered less than 5 mm bilateral pulmonary nodules also show no FDG uptake but are too small to characterize by PET. Right middle lobe ground-glass opacity shows low-grade FDG uptake, and differential diagnosis includes inflammatory or infectious processes and low-grade adenocarcinoma. Multiple low-attenuation liver masses show no hypermetabolic activity compared to normal hepatic parenchyma. These remain indeterminate and cannot be characterized without IV contrast. Abdomen MRI without and with contrast recommended for further evaluation. 2.5 cm left adrenal mass shows mild FDG uptake, and remains indeterminate. Differential diagnosis includes atypical adenoma, metastasis, pheochromocytoma, or less likely adrenal cortical carcinoma."  PLAN: -Discussed pt  labwork today, 08/10/2020; blood counts and chemistries stable. Magnesium normal. -Advised pt he is currently on his last cycle of treatment and concurrent radiation.  -Advised pt we will get repeat scans 12 weeks form completion of treatment to allow for recovery and reduce inflammatory findings.  -The pt has no prohibitive toxicities from continuing C6D1 Carboplatin/Taxol with concurrent radiation. -Recommended pt continue to use pain medication and given ointment as needed for pain management for completion of radiation and chemotherapy. -Continue to exercise swallowing function by drinking water daily. -Continue Vitamin B Complex daily. -Advised pt to eat well, drink 48-64 oz water daily, and stay active. -Recommend pt take Multivitamin daily. -Continue Sucralfate 3-4 times a day as needed to help with radiation mucositis. -Will see back in 3 weeks for pain management and labs.   FOLLOW UP: RTC with Dr Irene Limbo with portflush and labs in 3 weeks   The total time spent in the appointment was 30 minutes and more than 50% was on counseling and direct patient cares, ordering and mx of chemotherapy   All of the patient's questions were answered with apparent satisfaction. The patient knows to call the clinic with any problems, questions or concerns.   Sullivan Lone MD [place] AAHIVMS [place]  [facility] Hematology/Oncology Physician [facility]  (Office):       [phone] (Work cell):  [phone] (Fax):           [phone]  08/10/2020 12:26 PM  I, Reinaldo Raddle, am acting as scribe for Dr. Sullivan Lone, MD.   .I have reviewed the above documentation for accuracy and completeness, and I agree with the above. Brunetta Genera MD

## 2020-08-10 NOTE — Patient Instructions (Signed)
[facility] Discharge Instructions for Patients Receiving Chemotherapy  Today you received the following chemotherapy agents Paclitaxel(Taxol), Carboplatin.  To help prevent nausea and vomiting after your treatment, we encourage you to take your nausea medication as directed.   If you develop nausea and vomiting that is not controlled by your nausea medication, call the clinic.   BELOW ARE SYMPTOMS THAT SHOULD BE REPORTED IMMEDIATELY:  *FEVER GREATER THAN 100.5 F  *CHILLS WITH OR WITHOUT FEVER  NAUSEA AND VOMITING THAT IS NOT CONTROLLED WITH YOUR NAUSEA MEDICATION  *UNUSUAL SHORTNESS OF BREATH  *UNUSUAL BRUISING OR BLEEDING  TENDERNESS IN MOUTH AND THROAT WITH OR WITHOUT PRESENCE OF ULCERS  *URINARY PROBLEMS  *BOWEL PROBLEMS  UNUSUAL RASH Items with * indicate a potential emergency and should be followed up as soon as possible.  Feel free to call the clinic should you have any questions or concerns. The clinic phone number is [phone].  Please show the CHEMO ALERT CARD at check-in to the Emergency Department and triage nurse.   

## 2020-08-11 ENCOUNTER — Other Ambulatory Visit: Payer: Self-pay

## 2020-08-11 ENCOUNTER — Ambulatory Visit
Admission: RE | Admit: 2020-08-11 | Discharge: 2020-08-11 | Disposition: A | Discharge status: Home / Self Care | Payer: BC Managed Care – PPO | Source: Ambulatory Visit | Attending: Radiation Oncology | Admitting: Radiation Oncology

## 2020-08-11 ENCOUNTER — Ambulatory Visit: Payer: BC Managed Care – PPO

## 2020-08-11 DIAGNOSIS — C01 Malignant neoplasm of base of tongue: Secondary | ICD-10-CM | POA: Diagnosis not present

## 2020-08-11 DIAGNOSIS — C770 Secondary and unspecified malignant neoplasm of lymph nodes of head, face and neck: Secondary | ICD-10-CM | POA: Diagnosis not present

## 2020-08-11 DIAGNOSIS — Z87891 Personal history of nicotine dependence: Secondary | ICD-10-CM | POA: Diagnosis not present

## 2020-08-12 ENCOUNTER — Ambulatory Visit
Admission: RE | Admit: 2020-08-12 | Discharge: 2020-08-12 | Disposition: A | Discharge status: Home / Self Care | Payer: BC Managed Care – PPO | Source: Ambulatory Visit | Attending: Radiation Oncology | Admitting: Radiation Oncology

## 2020-08-12 ENCOUNTER — Other Ambulatory Visit: Payer: Self-pay

## 2020-08-12 ENCOUNTER — Ambulatory Visit: Payer: BC Managed Care – PPO

## 2020-08-12 DIAGNOSIS — C770 Secondary and unspecified malignant neoplasm of lymph nodes of head, face and neck: Secondary | ICD-10-CM | POA: Diagnosis not present

## 2020-08-12 DIAGNOSIS — C01 Malignant neoplasm of base of tongue: Secondary | ICD-10-CM | POA: Diagnosis not present

## 2020-08-12 DIAGNOSIS — Z87891 Personal history of nicotine dependence: Secondary | ICD-10-CM | POA: Diagnosis not present

## 2020-08-13 ENCOUNTER — Ambulatory Visit: Payer: BC Managed Care – PPO

## 2020-08-13 ENCOUNTER — Other Ambulatory Visit: Payer: Self-pay

## 2020-08-13 ENCOUNTER — Ambulatory Visit
Admission: RE | Admit: 2020-08-13 | Discharge: 2020-08-13 | Disposition: A | Discharge status: Home / Self Care | Payer: BC Managed Care – PPO | Source: Ambulatory Visit | Attending: Radiation Oncology | Admitting: Radiation Oncology

## 2020-08-13 DIAGNOSIS — C770 Secondary and unspecified malignant neoplasm of lymph nodes of head, face and neck: Secondary | ICD-10-CM | POA: Diagnosis not present

## 2020-08-13 DIAGNOSIS — C01 Malignant neoplasm of base of tongue: Secondary | ICD-10-CM | POA: Diagnosis not present

## 2020-08-13 DIAGNOSIS — Z87891 Personal history of nicotine dependence: Secondary | ICD-10-CM | POA: Diagnosis not present

## 2020-08-16 ENCOUNTER — Ambulatory Visit
Admission: RE | Admit: 2020-08-16 | Discharge: 2020-08-16 | Disposition: A | Discharge status: Home / Self Care | Payer: BC Managed Care – PPO | Source: Ambulatory Visit | Attending: Radiation Oncology | Admitting: Radiation Oncology

## 2020-08-16 DIAGNOSIS — C01 Malignant neoplasm of base of tongue: Secondary | ICD-10-CM | POA: Diagnosis not present

## 2020-08-16 DIAGNOSIS — Z87891 Personal history of nicotine dependence: Secondary | ICD-10-CM | POA: Diagnosis not present

## 2020-08-16 DIAGNOSIS — C770 Secondary and unspecified malignant neoplasm of lymph nodes of head, face and neck: Secondary | ICD-10-CM | POA: Diagnosis not present

## 2020-08-16 MED ORDER — SONAFINE EX EMUL
1.0000 "application " | Freq: Two times a day (BID) | CUTANEOUS | Status: DC
  Administered 2020-08-16: 1 via TOPICAL

## 2020-08-17 ENCOUNTER — Inpatient Hospital Stay: Payer: BC Managed Care – PPO | Admitting: Nutrition

## 2020-08-17 ENCOUNTER — Other Ambulatory Visit: Payer: Self-pay

## 2020-08-17 ENCOUNTER — Ambulatory Visit
Admission: RE | Admit: 2020-08-17 | Discharge: 2020-08-17 | Disposition: A | Discharge status: Home / Self Care | Payer: BC Managed Care – PPO | Source: Ambulatory Visit | Attending: Radiation Oncology | Admitting: Radiation Oncology

## 2020-08-17 ENCOUNTER — Encounter: Payer: Self-pay | Admitting: Dietician

## 2020-08-17 DIAGNOSIS — C01 Malignant neoplasm of base of tongue: Secondary | ICD-10-CM | POA: Diagnosis not present

## 2020-08-17 DIAGNOSIS — Z87891 Personal history of nicotine dependence: Secondary | ICD-10-CM | POA: Diagnosis not present

## 2020-08-17 DIAGNOSIS — C770 Secondary and unspecified malignant neoplasm of lymph nodes of head, face and neck: Secondary | ICD-10-CM | POA: Diagnosis not present

## 2020-08-17 NOTE — Progress Notes (Signed)
Nutrition follow-up completed with patient receiving concurrent chemoradiation therapy for tongue cancer.  Patient weight today 166.6 lbs in office slightly down from 168 lbs on 3/8.  Patient reports constipation, he has started taking a half dose of Miralax in the morning. He denies nausea, vomiting, diarrhea. Patient reports blood sugars have continued to be elevated over 200 most of the time. Patient is infusing 2 cartons of Glucerna 1.5 - 3 times daily with 1 packet of protein powder mixed with 30 ml water to each feeding. He is flushing with 60 ml water before and after each feeding.  Patient is drinking 3-4 (16 ounce) bottles of water by mouth and occasionally taking small sips of soda.  Estimated nutrition needs: 2500-2900, 125-145 grams of protein, 2.5 L fluid  Nutrition diagnosis: Inadequate energy intake continues  Intervention: Continue 6 cartons Glucerna 1.5 daily, split into 3 feedings Continue 1 packet protein powder mixed with 30 ml water at each feeding to provide additional 75 kcal, 18 grams protein. Additional samples provided today Continue 1440 ml free water by mouth or via tube in between feedings Miralax as needed   Tube feedings provide 2211 kcal, 135.6 grams protein, 2880 ml free water  Monitoring, evaluation, goals: Continue to monitor tolerance, weight trends, and blood sugars.  Next visit: Wednesday, March 30 after MD appointment

## 2020-08-18 ENCOUNTER — Ambulatory Visit
Admission: RE | Admit: 2020-08-18 | Discharge: 2020-08-18 | Disposition: A | Discharge status: Home / Self Care | Payer: BC Managed Care – PPO | Source: Ambulatory Visit | Attending: Radiation Oncology | Admitting: Radiation Oncology

## 2020-08-18 ENCOUNTER — Other Ambulatory Visit: Payer: Self-pay

## 2020-08-18 DIAGNOSIS — C01 Malignant neoplasm of base of tongue: Secondary | ICD-10-CM | POA: Diagnosis not present

## 2020-08-18 DIAGNOSIS — C770 Secondary and unspecified malignant neoplasm of lymph nodes of head, face and neck: Secondary | ICD-10-CM | POA: Diagnosis not present

## 2020-08-18 DIAGNOSIS — Z87891 Personal history of nicotine dependence: Secondary | ICD-10-CM | POA: Diagnosis not present

## 2020-08-19 ENCOUNTER — Ambulatory Visit
Admission: RE | Admit: 2020-08-19 | Discharge: 2020-08-19 | Disposition: A | Discharge status: Home / Self Care | Payer: BC Managed Care – PPO | Source: Ambulatory Visit | Attending: Radiation Oncology | Admitting: Radiation Oncology

## 2020-08-19 DIAGNOSIS — C770 Secondary and unspecified malignant neoplasm of lymph nodes of head, face and neck: Secondary | ICD-10-CM | POA: Diagnosis not present

## 2020-08-19 DIAGNOSIS — Z87891 Personal history of nicotine dependence: Secondary | ICD-10-CM | POA: Diagnosis not present

## 2020-08-19 DIAGNOSIS — C01 Malignant neoplasm of base of tongue: Secondary | ICD-10-CM | POA: Diagnosis not present

## 2020-08-20 ENCOUNTER — Encounter: Payer: Self-pay | Admitting: Radiation Oncology

## 2020-08-20 ENCOUNTER — Ambulatory Visit
Admission: RE | Admit: 2020-08-20 | Discharge: 2020-08-20 | Disposition: A | Discharge status: Home / Self Care | Payer: BC Managed Care – PPO | Source: Ambulatory Visit | Attending: Radiation Oncology | Admitting: Radiation Oncology

## 2020-08-20 DIAGNOSIS — C01 Malignant neoplasm of base of tongue: Secondary | ICD-10-CM | POA: Diagnosis not present

## 2020-08-20 DIAGNOSIS — C770 Secondary and unspecified malignant neoplasm of lymph nodes of head, face and neck: Secondary | ICD-10-CM | POA: Diagnosis not present

## 2020-08-20 DIAGNOSIS — Z87891 Personal history of nicotine dependence: Secondary | ICD-10-CM | POA: Diagnosis not present

## 2020-08-20 NOTE — Progress Notes (Signed)
Oncology Nurse Navigator Documentation  Met with Jose Harvey after final RT to offer support and to celebrate end of radiation treatment.   Provided verbal/written post-RT guidance:  Importance of keeping all follow-up appts, especially those with Nutrition and SLP.  Importance of protecting treatment area from sun.  Continuation of Sonafine application 2-3 times daily, application of antibiotic ointment to areas of raw skin; when supply of Sonafine exhausted transition to OTC lotion with vitamin E. Provided/reviewed Epic calendar of upcoming appts. Explained my role as navigator will continue for several more months, encouraged him to call me with needs/concerns.    Harlow Asa RN, BSN, OCN Head & Neck Oncology Nurse [place] at [facility] Phone # [phone]  Fax # [phone]

## 2020-08-23 DIAGNOSIS — C01 Malignant neoplasm of base of tongue: Secondary | ICD-10-CM | POA: Diagnosis not present

## 2020-08-23 DIAGNOSIS — E119 Type 2 diabetes mellitus without complications: Secondary | ICD-10-CM | POA: Diagnosis not present

## 2020-08-23 DIAGNOSIS — R0989 Other specified symptoms and signs involving the circulatory and respiratory systems: Secondary | ICD-10-CM | POA: Diagnosis not present

## 2020-08-23 DIAGNOSIS — Z794 Long term (current) use of insulin: Secondary | ICD-10-CM | POA: Diagnosis not present

## 2020-08-24 IMAGING — DX DG CHEST 2V
2 series · 2 of 2 positions shown · non-contrast
Comparison: None.

CLINICAL DATA: Chest tube removed.  Feels better.

EXAM:
CHEST - 2 VIEW

[dg chest 2 view (1 of 2)]
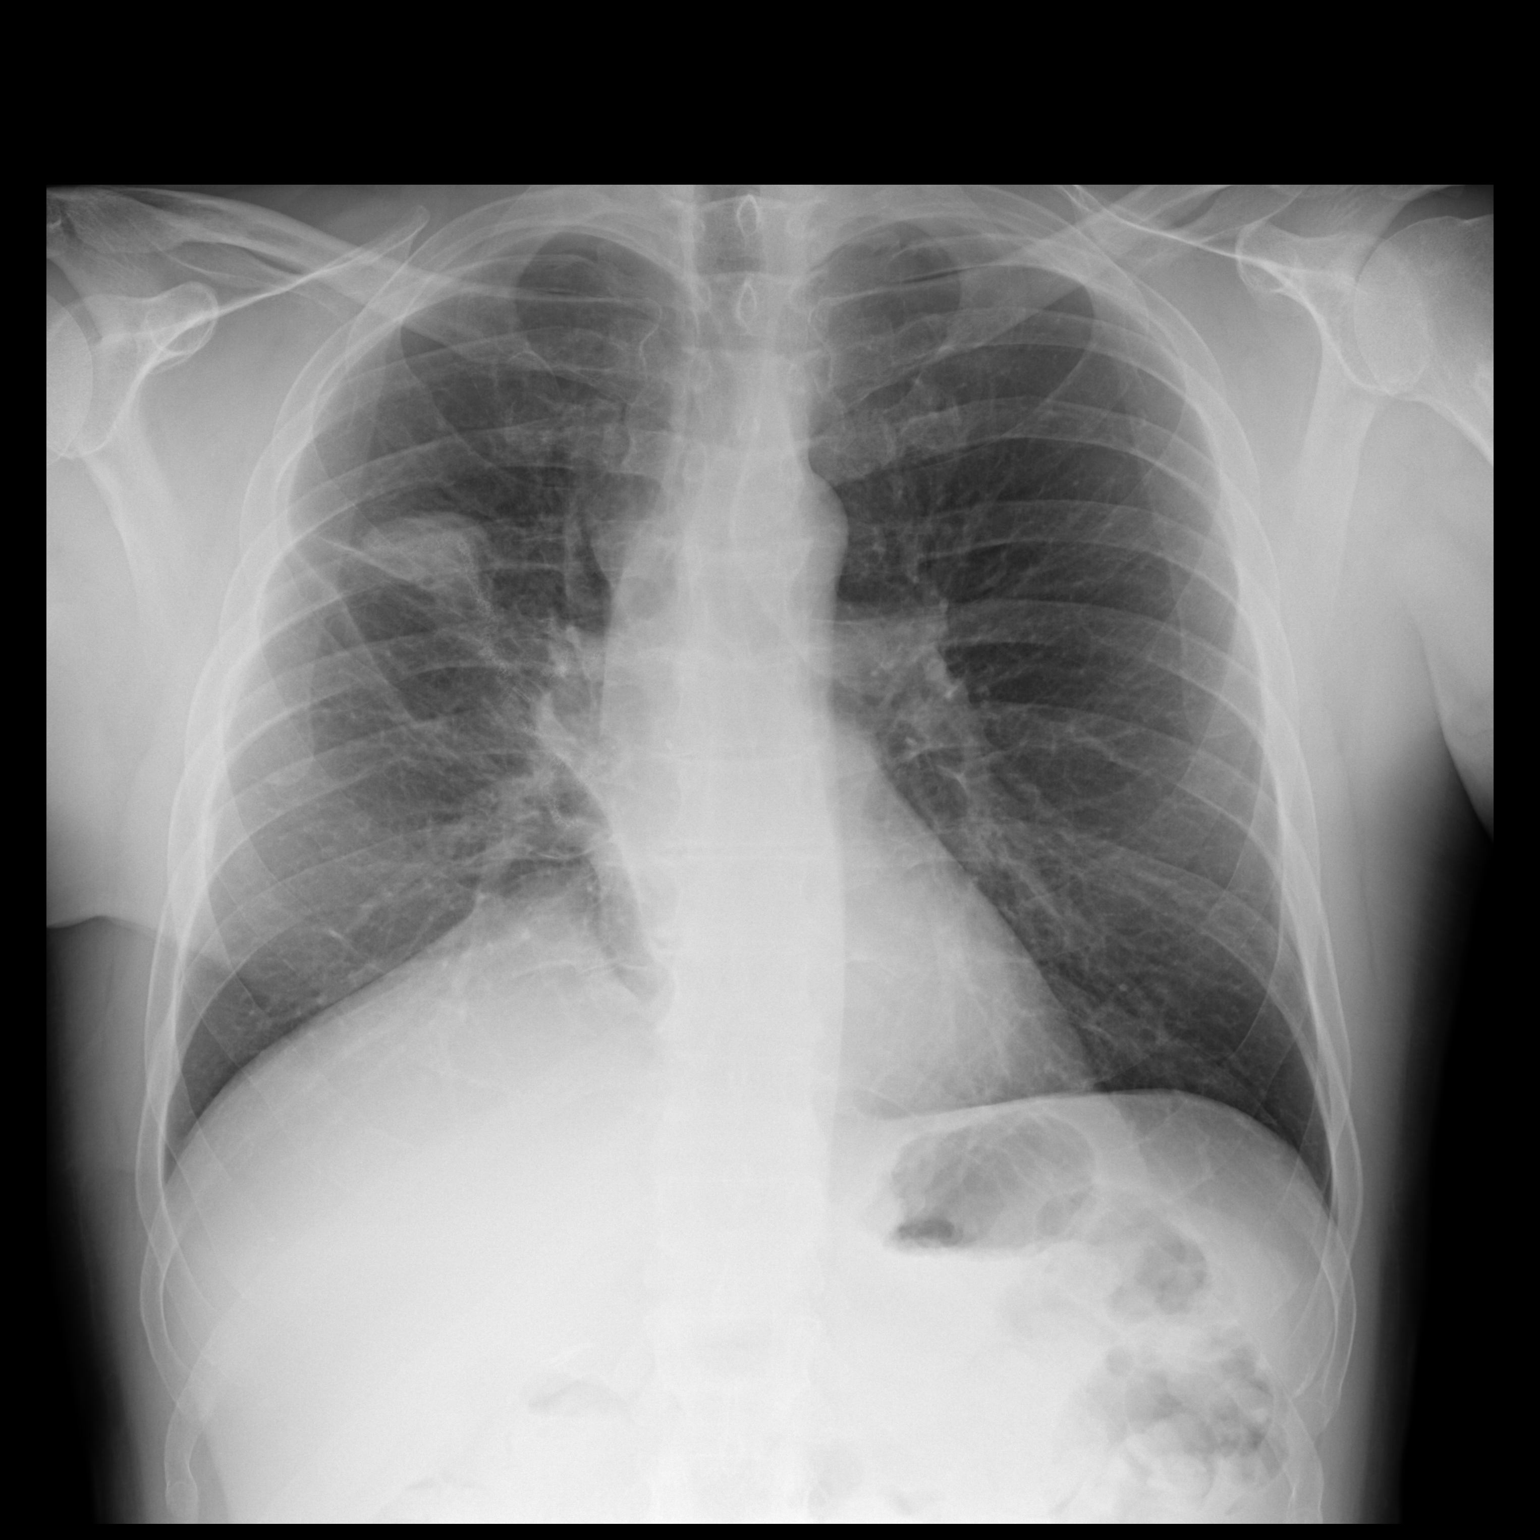

[dg chest 2 view (2 of 2)]
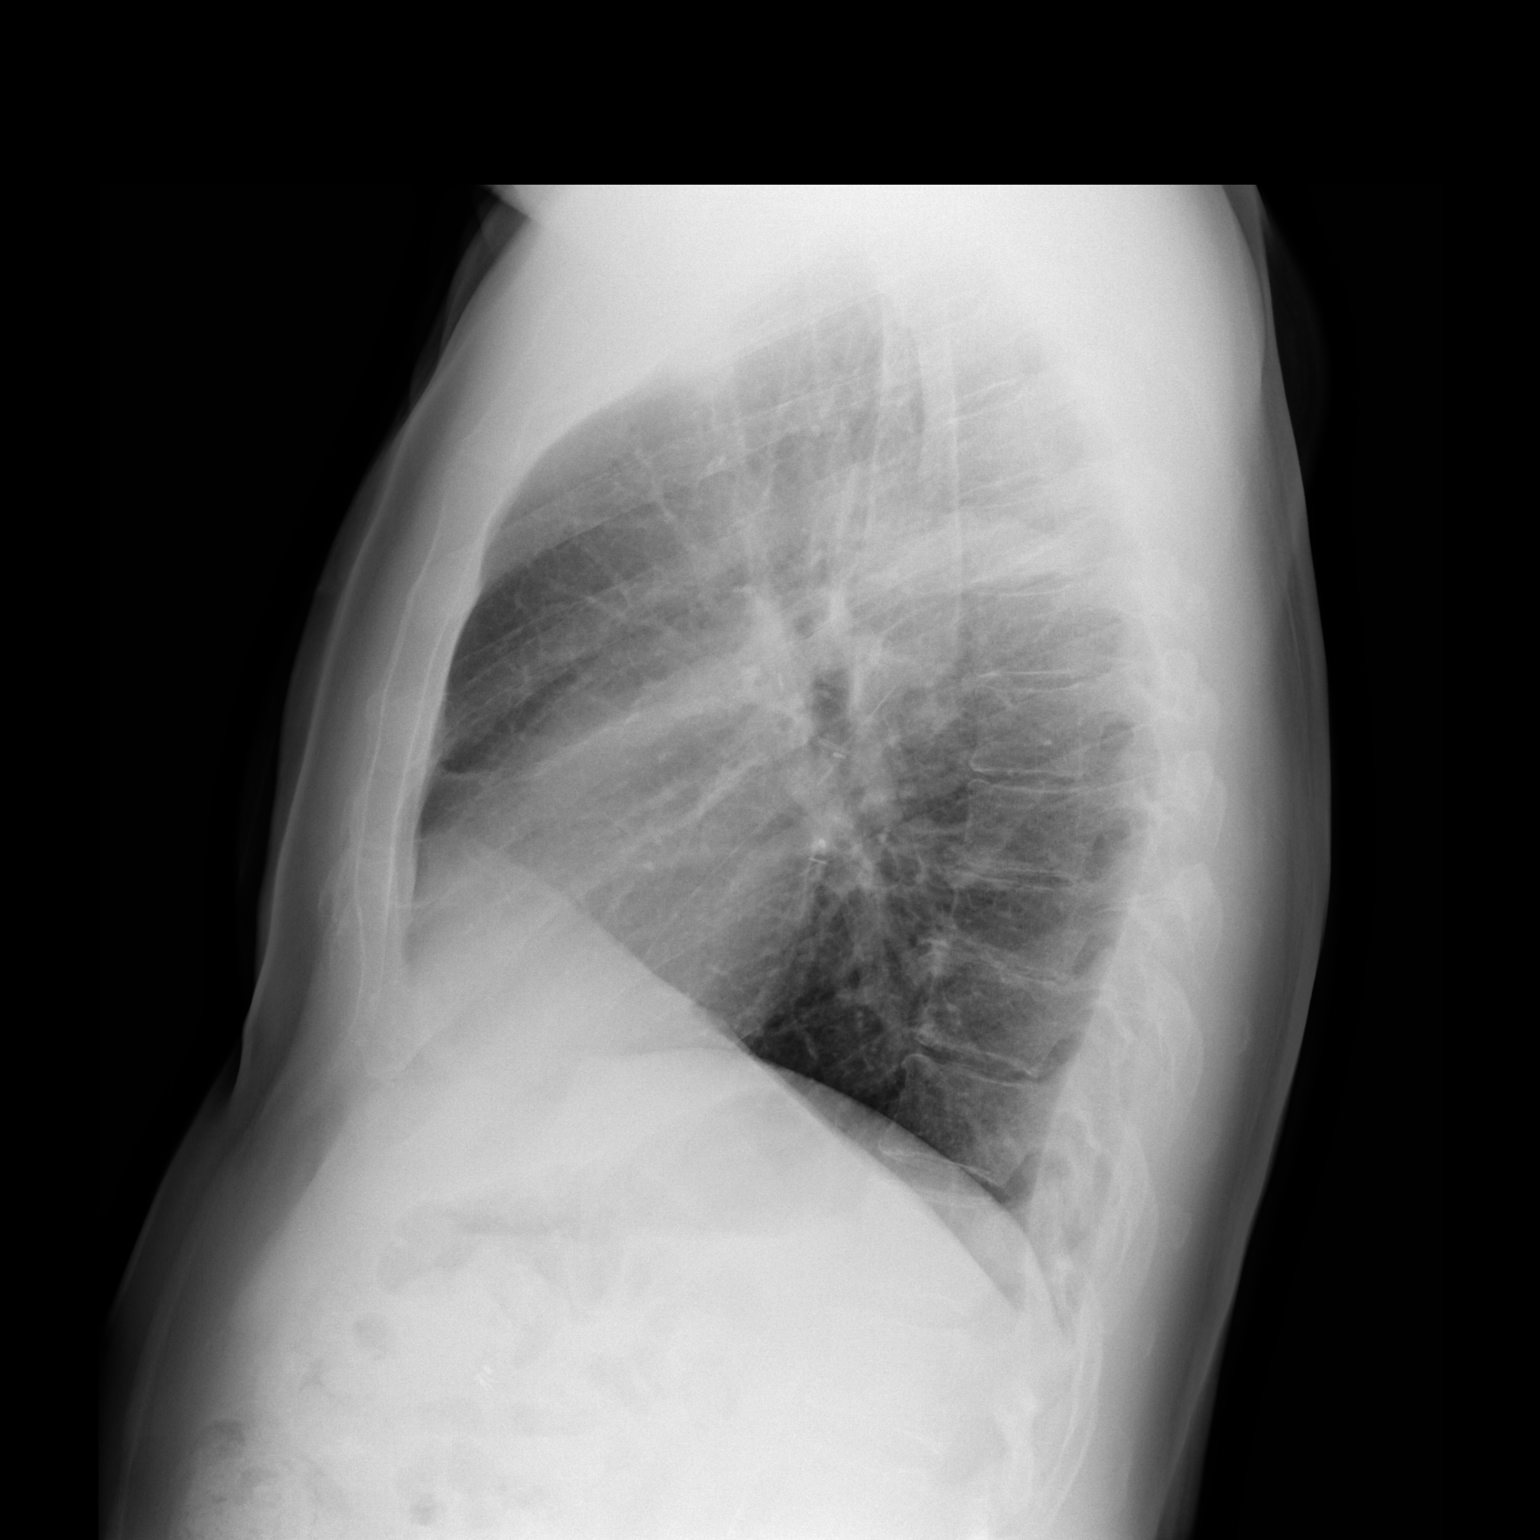

[2 of 2 positions shown; findings below may reference images not displayed]

FINDINGS: Resolution of the right pneumothorax. Persistent oval opacity along
the major fissure. Minimally smaller. Left lung clear.
IMPRESSION: Resolution of the pneumothorax.

Persistent opacity along the major fissure on the right.

## 2020-08-31 ENCOUNTER — Other Ambulatory Visit: Payer: Self-pay | Admitting: *Deleted

## 2020-08-31 DIAGNOSIS — C01 Malignant neoplasm of base of tongue: Secondary | ICD-10-CM

## 2020-08-31 NOTE — Progress Notes (Signed)
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 Pt is accompanied today by his sister. The pt reports that he is doing well overall.  The pt reports that he was a long-term smoker and due to that he has been a participant in a lung cancer screening program. While doing routine monitoring small lung nodules were first noticed in 2017. They did not feel that action needed to be taken at that time so they simply continued monitoring him. A significant change was noticed within the last 6 months. Pt denies any fevers, chills, night sweats, SOB during this time. Pt had a Thoracoscopy with Middle Lobectomy with Dr. Koleen Nimrod on 05/08/2019. He currently feels well and denies any of the aforementioned symptoms except an increase in SOB, noticed after surgery.   Pt quit smoking about 10 years ago. He was diagnosed with COPD in 2018 and given an inhaler. He did not notice a difference with the inhaler so he has since stopped using it. Pt has not had any issues with vertigo in sometime and his diabetes has been well controlled. Pt has a history of Prostate Cancer and had it resected in January of 2019. Pt also had a cholecystectomy, which they decided on due to activity seen on 2017 PET scan. He denies any issues  resulting from his gallbladder removal. Diabetes and heart disease runs in his family. His father had prostate cancer and heart disease as well.   Pt will return to work in Spring. There is dust and floating debris at his job but he is not around it very often. Pt has stayed up to date with his annual flu vaccine as well as his pneumonia vaccines.   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 Masslike area of ground-glass straddles the minor fissure with septal thickening and fissural retraction. While the lesion has increased in size minimally from 09/30/2018, there is clear enlargement from baseline examination on 02/16/2016. Therefore, lesion is characterized as worrisome for adenocarcinoma, Lung-RADS 4B, suspicious. Additional imaging evaluation or consultation with Pulmonology or Thoracic Surgery recommended. These results will be called to the ordering clinician or representative by the Radiologist Assistant, and communication documented in the PACS or zVision Dashboard. 2. Liver hemangiomas and left adrenal adenoma, best characterized on 03/03/2016. 3. Aortic atherosclerosis (ICD10-170.0). Coronary artery calcification."  Pt has had PET/CT ([id number]) completed on 02/25/2016 with results revealing "Dominant 8 mm right lower lobe pulmonary nodule shows no significant FDG uptake. Other scattered less than 5 mm bilateral pulmonary nodules also show no FDG uptake but are too small to characterize by PET. Recommend followup  by chest CT in 6 months. (please use the following order, "CT CHEST LCS NODULE FOLLOW-UP W/O CM") Right middle lobe ground-glass  opacity shows low-grade FDG uptake, and differential diagnosis includes inflammatory or infectious processes and low-grade adenocarcinoma. Recommend followup by chest CT in 6 months.  Multiple low-attenuation liver masses show no hypermetabolic activity compared to normal hepatic parenchyma. These remain indeterminate and cannot be characterized without IV contrast. Abdomen MRI without and with contrast recommended for further evaluation. 2.5 cm left adrenal mass shows mild FDG uptake, and remains indeterminate. Differential diagnosis includes atypical adenoma, metastasis, pheochromocytoma, or less likely adrenal cortical carcinoma. This can also be further evaluated with abdomen MRI without and with contrast."  Most recent lab results (05/12/2019) of CBC is as follows: all values are WNL except for RBC at 3.62, Hgb at 11.2, HCT at 34.2.  On review of systems, pt reports increased SOB and denies testicular pain/swelling, leg swelling, fevers, chills, night sweats and any other symptoms.   On PMHx the pt reports COPD with emphysema, Benign Positional Vertigo, Diabetes, Prostate Cancer, Prostatectomy (2019), Cholecystectomy. On Social Hx the pt reports he is a former smoker who quit 10 years ago On Family Hx the pt reports that his father had Prostate Cancer and Heart Disease, additional family history of Heart Disease and Diabetes.  INTERVAL HISTORY:   Jose Harvey is a wonderful 60 y.o. male who is here for evaluation and management of recently diagnosed head and neck squamous cell carcinoma. [name] last visit with [place] was on 08/10/2020. The pt reports that he is doing well overall.  The pt had his final radiation treatment on 08/20/2020. The pt finished his last cycle of Caboplatin/Taxol.  The pt reports that the redness in his neck has started to improve a lot since finishing radiation. The pt notes that he still has throat pain, but this is improved. He notes that earlier today he was able  to take some sips of cold water. The pt notes that he has crushed up and used some Advil as needed for pain. The pt denies having any other pain medications to his knowledge but wishes to have something. The pt prefers liquid form over pills at this time. The pt notes that he is exclusively using his tube. The pt notes the pain is continuous but intermittently is more bothersome. The pt notes he coughs intermittently, and experiences pain when swallowing something with artificial sweeteners. The pt denies coughing or choking when drinking water. The pt notes he currently drinks 1-1.5 bottles water himself and 3 bottles via the tube.  Lab results today 09/01/2020 of CBC w/diff and CMP is as follows: all values are WNL except for RBC of 3.99, Hgb of 12.2, HCT of 36.7, Lymphs Abs of 0.3K, Abs Immature Granulocytes of 0.08K. CMP pending. 09/01/2020 Magnesium pending. 09/01/2020 LDH pending.   On review of systems, pt reports neck pain, neck redness, improved neck swelling and denies fevers, chills, night sweats, choking, coughing, thrush, major mouth redness, and any other symptoms.  MEDICAL HISTORY:  Past Medical History:  Diagnosis Date  . Adrenal adenoma, left 03/2017   noted on CT  . Allergic rhinitis, cause unspecified   . Anxiety state, unspecified   . Aortic atherosclerosis ([place]) 03/2017   noted on CT  . BPV (benign positional vertigo)   . COPD (chronic obstructive pulmonary disease) with emphysema (HCC)    mild followed by [facility]   . Coronary artery disease   . Depressive  disorder, not elsewhere classified   . Dyspnea    history of  . ED (erectile dysfunction)   . Gallbladder polyp 03/2016   noted on CT  . Hepatic hemangioma 03/2017   noted on CT  . History of kidney stones    hx of years ago   . Indigestion   . 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 Mixed hyperlipidemia   . Prostate cancer ([place])   . 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 Type I (juvenile type) diabetes mellitus without mention of complication, not stated as uncontrolled    type I - followed by Dr Buddy Duty   . Vesicoureteral reflux     SURGICAL HISTORY: Past Surgical History:  Procedure Laterality Date  . CHOLECYSTECTOMY N/A 05/17/2016   Procedure: LAPAROSCOPIC CHOLECYSTECTOMY;  Surgeon: Clovis Riley, MD;  Location: [facility];  Service: General;  Laterality: N/A;  . cyst removed from throat     . dental surgeries     . IR GASTROSTOMY TUBE MOD SED  07/07/2020  . IR IMAGING GUIDED PORT INSERTION  07/07/2020  . LYMPHADENECTOMY Bilateral 07/02/2017   Procedure: BILATERAL LYMPHADENECTOMY;  Surgeon: Lucas Mallow, MD;  Location: [facility];  Service: Urology;  Laterality: Bilateral;  . PROSTATE BIOPSY    . ROBOT ASSISTED LAPAROSCOPIC RADICAL PROSTATECTOMY N/A 07/02/2017   Procedure: XI ROBOTIC ASSISTED LAPAROSCOPIC RADICAL PROSTATECTOMY;  Surgeon: Lucas Mallow, MD;  Location: [facility];  Service: Urology;  Laterality: N/A;  . TONSILLECTOMY    . VIDEO ASSISTED THORACOSCOPY (VATS)/ LOBECTOMY Right 05/08/2019   Procedure: Right VIDEO ASSISTED THORACOSCOPY with   MIDDLE LOBECTOMY and Enbloc portion of Upper Lobe with Node dissection, Intercostal Nerve Block;  Surgeon: Melrose Nakayama, MD;  Location: [facility];  Service: Thoracic;  Laterality: Right;  . WISDOM TOOTH EXTRACTION      SOCIAL HISTORY: Social History   Socioeconomic History  . Marital status: Divorced    Spouse name: Not on file  . Number of children: 2  . Years of education: Not on file  . 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 Smoking status: Former Smoker    Packs/day: 1.50    Years: 27.00    Pack years: 40.50    Types: Cigarettes    Quit date: 06/05/2008    Years since quitting: 12.2  . Smokeless tobacco: Former Systems developer    Types: Snuff    Quit date: 2015   Vaping Use  . Vaping Use: Never used  Substance and Sexual Activity  . Alcohol use: Yes    Alcohol/week: 6.0 standard drinks    Types: 6 Cans of beer per week    Comment: occasional 6pk per week  . Drug use: No  . Sexual activity: Not Currently  Other Topics Concern  . Not on file  Social History Narrative   Patient divorced. Patient has two grown daughters but neither lives locally.   Social Determinants of Health   Financial Resource Strain: Low Risk   . Difficulty of Paying Living Expenses: Not very hard  Food Insecurity: No Food Insecurity  . Worried About Charity fundraiser in the Last Year: Never true  . Ran Out of Food in the Last Year: Never true  Transportation Needs: No Transportation Needs  . Lack of Transportation (Medical): No  . Lack of Transportation (Non-Medical): No  Physical  Activity: Not on file  Stress: No Stress Concern Present  . Feeling of Stress : Only a little  Social Connections: Unknown  . Frequency of Communication with Friends and Family: More than three times a week  . Frequency of Social Gatherings with Friends and Family: More than three times a week  . Attends Religious Services: More than 4 times per year  . Active Member of Clubs or Organizations: Yes  . Attends Archivist Meetings: More than 4 times per year  . Marital Status: Not on file  Intimate Partner Violence: Not on file    FAMILY HISTORY: Family History  Problem Relation Age of Onset  . Diabetes type I Father   . Heart attack Father   . Hypertension Father   . Emphysema Father   . CAD Father   . Colon cancer Maternal Grandfather   . Emphysema Paternal Grandfather   . Diabetes type I Paternal Grandfather   . Kidney disease Neg Hx   . Liver disease Neg Hx   . Breast cancer Neg Hx   . Prostate cancer Neg Hx     ALLERGIES:  is allergic to crestor [rosuvastatin], lipitor [atorvastatin], other, and zocor [simvastatin].  MEDICATIONS:  Current Outpatient  Medications  Medication Sig Dispense Refill  . lidocaine (XYLOCAINE) 2 % solution Patient: Mix 1part 2% viscous lidocaine, 1part H20. Swish & swallow 87mL of diluted mixture, 16min before meals and at bedtime, up to QID 200 mL 4  . aspirin 81 MG tablet Take 1 tablet (81 mg total) by mouth daily. 30 tablet   . cetirizine (ZYRTEC) 10 MG tablet Take 10 mg by mouth daily.    . chlorhexidine (PERIDEX) 0.12 % solution Use as directed 15 mLs in the mouth or throat daily.    . Coenzyme Q10 (COQ10) 100 MG CAPS Take 100 mg by mouth daily.    . Continuous Blood Gluc Sensor (FREESTYLE LIBRE 14 DAY SENSOR) MISC AS DIRECTED CHANGE SENSOR EVERY 14 DAYS 28    . dexamethasone (DECADRON) 4 MG tablet Take 2 tablets (8 mg total) by mouth daily. Start the day after chemotherapy for 2 days. 30 tablet 1  . ibuprofen (ADVIL) 200 MG tablet Take 400-800 mg by mouth every 6 (six) hours as needed for moderate pain. (Patient not taking: Reported on 06/17/2020)    . Insulin Human (INSULIN PUMP) SOLN Inject into the skin continuous. NOVOLOG 100 UNIT/ML VIAL    . lidocaine-prilocaine (EMLA) cream Apply to affected area once 30 g 3  . lisinopril (PRINIVIL,ZESTRIL) 5 MG tablet Take 5 mg by mouth daily.     Marland Kitchen LORazepam (ATIVAN) 0.5 MG tablet Take 1 tablet (0.5 mg total) by mouth every 6 (six) hours as needed (Nausea or vomiting). 30 tablet 0  . Multiple Vitamins-Minerals (ICAPS AREDS 2) CAPS Take 1 capsule by mouth daily.    Marland Kitchen NOVOLOG 100 UNIT/ML injection Averages about 30 units per day via insulin pump    . Nutritional Supplements (FEEDING SUPPLEMENT, GLUCERNA 1.5 CAL,) LIQD Give 2 cartons at 8am, noon and 4pm. Give 1 carton at 8pm. Flush with 42ml of water before and after.  Drink or give additional water via tube of 276ml 3 times per day.  Meets 100% of needs.  Send bolus supplies. 1659 mL 5  . ondansetron (ZOFRAN) 8 MG tablet Take 1 tablet (8 mg total) by mouth 2 (two) times daily as needed for refractory nausea / vomiting. Start  on day 3 after chemo. (Patient not taking:  Reported on 09/01/2020) 30 tablet 1  . pantoprazole (PROTONIX) 40 MG tablet Take 40 mg by mouth daily.    . prochlorperazine (COMPAZINE) 10 MG tablet Take 1 tablet (10 mg total) by mouth every 6 (six) hours as needed (Nausea or vomiting). 30 tablet 1  . rosuvastatin (CRESTOR) 5 MG tablet Take 1 tablet (5 mg total) by mouth daily. 90 tablet 3  . sertraline (ZOLOFT) 100 MG tablet Take 50 mg by mouth daily.  0  . sucralfate (CARAFATE) 1 g tablet Take 1 tablet (1 g total) by mouth 4 (four) times daily -  with meals and at bedtime. (Patient not taking: Reported on 09/01/2020) 90 tablet 1   No current facility-administered medications for this visit.    REVIEW OF SYSTEMS:   10 Point review of Systems was done is negative except as noted above.   PHYSICAL EXAMINATION: ECOG PERFORMANCE STATUS: 0 - Asymptomatic  GENERAL:alert, in no acute distress and comfortable SKIN: no acute rashes, no significant lesions EYES: conjunctiva are pink and non-injected, sclera anicteric OROPHARYNX: MMM, no exudates, no oropharyngeal erythema or ulceration NECK: supple, no JVD LYMPH:  no palpable lymphadenopathy in the cervical, axillary or inguinal regions LUNGS: clear to auscultation b/l with normal respiratory effort HEART: regular rate & rhythm ABDOMEN:  normoactive bowel sounds , non tender, not distended. Extremity: no pedal edema PSYCH: alert & oriented x 3 with fluent speech NEURO: no focal motor/sensory deficits  LABORATORY DATA:  I have reviewed the data as listed  . CBC Latest Ref Rng & Units 09/01/2020 08/10/2020 08/03/2020  WBC 4.0 - 10.5 K/uL 6.5 4.3 5.2  Hemoglobin 13.0 - 17.0 g/dL 12.2(L) 12.2(L) 12.1(L)  Hematocrit 39.0 - 52.0 % 36.7(L) 35.3(L) 35.7(L)  Platelets 150 - 400 K/uL 371 159 192    . CMP Latest Ref Rng & Units 09/01/2020 08/10/2020 08/03/2020  Glucose 70 - 99 mg/dL 426(H) 277(H) 340(H)  BUN 8 - 23 mg/dL 49(H) 36(H) 35(H)  Creatinine 0.61 -  1.24 mg/dL 0.93 0.84 0.99  Sodium 135 - 145 mmol/L 136 133(L) 135  Potassium 3.5 - 5.1 mmol/L 4.8 4.4 4.7  Chloride 98 - 111 mmol/L 99 99 101  CO2 22 - 32 mmol/L 26 26 25   Calcium 8.9 - 10.3 mg/dL 8.4(L) 8.9 8.7(L)  Total Protein 6.5 - 8.1 g/dL 6.3(L) 6.6 6.6  Total Bilirubin 0.3 - 1.2 mg/dL <0.2(L) 0.2(L) 0.2(L)  Alkaline Phos 38 - 126 U/L 96 87 81  AST 15 - 41 U/L 35 30 33  ALT 0 - 44 U/L 53(H) 52(H) 57(H)     05/26/2020 Right Cervical LN Bx ([id number]):   05/08/2019 Surgical Pathology [id number]):    05/08/2019 Surgical Pathology [phone]):    RADIOGRAPHIC STUDIES: I have personally reviewed the radiological images as listed and agreed with the findings in the report. No results found.  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 Patient is appropriate to pursue-Fourth weekly dose of carboplatin Taxol. Ordered use of sucralfate to help with his  radiation mucositis  -Counseled extensively on continued smoking cessation and avoiding alcohol use. -Continue to optimize nutrition through feeding tube. -Notes that he is trying to stay well-hydrated.   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 Masslike area of ground-glass straddles the minor fissure with septal thickening and fissural retraction. While the lesion has increased in size minimally from 09/30/2018, there is clear enlargement from baseline examination on 02/16/2016. Therefore, lesion is characterized as worrisome for adenocarcinoma, Lung-RADS 4B, suspicious. -02/25/2016 PET/CT ([id number]) revealed "Dominant 8 mm right lower lobe pulmonary nodule shows no significant FDG uptake. Other scattered less than 5 mm bilateral pulmonary nodules also show no FDG uptake but are too small to characterize by PET. Right middle lobe ground-glass opacity shows low-grade FDG uptake, and differential diagnosis includes inflammatory or infectious processes and low-grade adenocarcinoma. Multiple low-attenuation liver masses show no hypermetabolic activity compared to normal hepatic parenchyma. These remain indeterminate and cannot be characterized without IV contrast. Abdomen MRI without and with contrast recommended for further evaluation. 2.5 cm left adrenal mass shows mild FDG uptake, and remains indeterminate. Differential diagnosis includes atypical adenoma, metastasis, pheochromocytoma, or less likely adrenal cortical carcinoma."  PLAN: -Discussed pt labwork today, 09/01/2020; blood counts stable, no lymphopenia, other labs pending. -Advised pt we will repeat scans 12 weeks post-radiation completion.  -Recommended pt increase from 6 cans daily to 7 cans of nutritional supplement. -Recommended pt continue to use pain medication and given ointment as needed for pain management for completion of radiation and chemotherapy. -Continue to exercise swallowing function by drinking water daily. -Continue Vitamin B Complex daily. -Advised pt to eat well, drink  48-64 oz water daily, and stay active. -f/u with PCP to optimize DM2 management. -Continue f/u w Nutritionist and Speech Therapy. -Advised pt to use Zofran for first two days with Oxycodone. -Rx Liquid Oxycodone. -Will see back in 4 weeks with labs.   FOLLOW UP: Portflush labs and f/u with Dr Irene Limbo in 4 weeks   The total time spent in the appointment was 20 minutes and more than 50% was on counseling and direct patient cares.    All of the patient's questions were answered with apparent satisfaction. The patient knows to call the clinic with any problems, questions or concerns.   Sullivan Lone MD [place] AAHIVMS [facility] Hematology/Oncology Physician [facility]  (Office):       [phone] (Work cell):  [phone] (Fax):           [phone]  09/01/2020 2:40 PM  I, Reinaldo Raddle, am acting as scribe for Dr. Sullivan Lone, MD.   .I have reviewed the above documentation for accuracy and completeness, and I agree with the above. Brunetta Genera MD

## 2020-09-01 ENCOUNTER — Inpatient Hospital Stay: Payer: BC Managed Care – PPO

## 2020-09-01 ENCOUNTER — Inpatient Hospital Stay: Payer: BC Managed Care – PPO | Admitting: Dietician

## 2020-09-01 ENCOUNTER — Other Ambulatory Visit: Payer: Self-pay

## 2020-09-01 ENCOUNTER — Inpatient Hospital Stay (HOSPITAL_BASED_OUTPATIENT_CLINIC_OR_DEPARTMENT_OTHER): Payer: BC Managed Care – PPO | Admitting: Hematology

## 2020-09-01 VITALS — BP 117/63 | HR 94 | Temp 97.5°F | Resp 18 | Ht 71.0 in | Wt 164.7 lb

## 2020-09-01 DIAGNOSIS — Z87891 Personal history of nicotine dependence: Secondary | ICD-10-CM | POA: Diagnosis not present

## 2020-09-01 DIAGNOSIS — C884 Extranodal marginal zone B-cell lymphoma of mucosa-associated lymphoid tissue [MALT-lymphoma]: Secondary | ICD-10-CM | POA: Diagnosis not present

## 2020-09-01 DIAGNOSIS — C01 Malignant neoplasm of base of tongue: Secondary | ICD-10-CM | POA: Diagnosis not present

## 2020-09-01 DIAGNOSIS — Z8042 Family history of malignant neoplasm of prostate: Secondary | ICD-10-CM | POA: Diagnosis not present

## 2020-09-01 DIAGNOSIS — Z800 Family history of malignant neoplasm of digestive organs: Secondary | ICD-10-CM | POA: Diagnosis not present

## 2020-09-01 DIAGNOSIS — Z7289 Other problems related to lifestyle: Secondary | ICD-10-CM | POA: Diagnosis not present

## 2020-09-01 DIAGNOSIS — Z8249 Family history of ischemic heart disease and other diseases of the circulatory system: Secondary | ICD-10-CM | POA: Diagnosis not present

## 2020-09-01 DIAGNOSIS — K1233 Oral mucositis (ulcerative) due to radiation: Secondary | ICD-10-CM

## 2020-09-01 DIAGNOSIS — Z833 Family history of diabetes mellitus: Secondary | ICD-10-CM | POA: Diagnosis not present

## 2020-09-01 DIAGNOSIS — Z95828 Presence of other vascular implants and grafts: Secondary | ICD-10-CM

## 2020-09-01 DIAGNOSIS — Z79899 Other long term (current) drug therapy: Secondary | ICD-10-CM | POA: Diagnosis not present

## 2020-09-01 DIAGNOSIS — C770 Secondary and unspecified malignant neoplasm of lymph nodes of head, face and neck: Secondary | ICD-10-CM | POA: Diagnosis not present

## 2020-09-01 DIAGNOSIS — E440 Moderate protein-calorie malnutrition: Secondary | ICD-10-CM

## 2020-09-01 DIAGNOSIS — J449 Chronic obstructive pulmonary disease, unspecified: Secondary | ICD-10-CM | POA: Diagnosis not present

## 2020-09-01 DIAGNOSIS — Z8546 Personal history of malignant neoplasm of prostate: Secondary | ICD-10-CM | POA: Diagnosis not present

## 2020-09-01 DIAGNOSIS — I700 Atherosclerosis of aorta: Secondary | ICD-10-CM | POA: Diagnosis not present

## 2020-09-01 DIAGNOSIS — Z836 Family history of other diseases of the respiratory system: Secondary | ICD-10-CM | POA: Diagnosis not present

## 2020-09-01 DIAGNOSIS — Z5111 Encounter for antineoplastic chemotherapy: Secondary | ICD-10-CM | POA: Diagnosis not present

## 2020-09-01 LAB — CMP (CANCER CENTER ONLY)
ALT: 53 U/L — ABNORMAL HIGH (ref 0–44)
AST: 35 U/L (ref 15–41)
Albumin: 2.8 g/dL — ABNORMAL LOW (ref 3.5–5.0)
Alkaline Phosphatase: 96 U/L (ref 38–126)
Anion gap: 11 (ref 5–15)
BUN: 49 mg/dL — ABNORMAL HIGH (ref 8–23)
CO2: 26 mmol/L (ref 22–32)
Calcium: 8.4 mg/dL — ABNORMAL LOW (ref 8.9–10.3)
Chloride: 99 mmol/L (ref 98–111)
Creatinine: 0.93 mg/dL (ref 0.61–1.24)
GFR, Estimated: >60 mL/min (ref >60)
Glucose, Bld: 426 mg/dL — ABNORMAL HIGH (ref 70–99)
Potassium: 4.8 mmol/L (ref 3.5–5.1)
Sodium: 136 mmol/L (ref 135–145)
Total Bilirubin: <0.2 mg/dL — ABNORMAL LOW (ref 0.3–1.2)
Total Protein: 6.3 g/dL — ABNORMAL LOW (ref 6.5–8.1)

## 2020-09-01 LAB — CBC WITH DIFFERENTIAL (CANCER CENTER ONLY)
Abs Immature Granulocytes: 0.08 10*3/uL — ABNORMAL HIGH (ref 0.00–0.07)
Basophils Absolute: 0.1 10*3/uL (ref 0.0–0.1)
Basophils Relative: 1 %
Eosinophils Absolute: 0.2 10*3/uL (ref 0.0–0.5)
Eosinophils Relative: 3 %
HCT: 36.7 % — ABNORMAL LOW (ref 39.0–52.0)
Hemoglobin: 12.2 g/dL — ABNORMAL LOW (ref 13.0–17.0)
Immature Granulocytes: 1 %
Lymphocytes Relative: 4 %
Lymphs Abs: 0.3 10*3/uL — ABNORMAL LOW (ref 0.7–4.0)
MCH: 30.6 pg (ref 26.0–34.0)
MCHC: 33.2 g/dL (ref 30.0–36.0)
MCV: 92 fL (ref 80.0–100.0)
Monocytes Absolute: 0.8 10*3/uL (ref 0.1–1.0)
Monocytes Relative: 13 %
Neutro Abs: 5.1 10*3/uL (ref 1.7–7.7)
Neutrophils Relative %: 78 %
Platelet Count: 371 10*3/uL (ref 150–400)
RBC: 3.99 MIL/uL — ABNORMAL LOW (ref 4.22–5.81)
RDW: 13.9 % (ref 11.5–15.5)
WBC Count: 6.5 10*3/uL (ref 4.0–10.5)
nRBC: 0 % (ref 0.0–0.2)

## 2020-09-01 LAB — LACTATE DEHYDROGENASE: LDH: 205 U/L — ABNORMAL HIGH (ref 98–192)

## 2020-09-01 LAB — MAGNESIUM: Magnesium: 2.1 mg/dL (ref 1.7–2.4)

## 2020-09-01 MED ORDER — SODIUM CHLORIDE 0.9% FLUSH
10.0000 mL | Freq: Once | INTRAVENOUS | Status: AC
  Administered 2020-09-01: 10 mL
  Filled 2020-09-01: qty 10

## 2020-09-01 MED ORDER — OXYCODONE HCL 5 MG/5ML PO SOLN: 5.0000 mg | Freq: Four times a day (QID) | ORAL | 0 refills | Status: DC | PRN

## 2020-09-01 MED ORDER — HEPARIN SOD (PORK) LOCK FLUSH 100 UNIT/ML IV SOLN
500.0000 [IU] | Freq: Once | INTRAVENOUS | Status: AC
  Administered 2020-09-01: 500 [IU]
  Filled 2020-09-01: qty 5

## 2020-09-02 ENCOUNTER — Encounter: Payer: Self-pay | Admitting: Physical Therapy

## 2020-09-02 ENCOUNTER — Ambulatory Visit: Payer: BC Managed Care – PPO | Admitting: Physical Therapy

## 2020-09-02 DIAGNOSIS — C01 Malignant neoplasm of base of tongue: Secondary | ICD-10-CM | POA: Diagnosis not present

## 2020-09-02 DIAGNOSIS — R293 Abnormal posture: Secondary | ICD-10-CM

## 2020-09-02 DIAGNOSIS — R1310 Dysphagia, unspecified: Secondary | ICD-10-CM | POA: Diagnosis not present

## 2020-09-02 NOTE — Therapy (Signed)
[place], [place], [address] Phone: [phone]   Fax:  [phone]  Physical Therapy Treatment  Patient Details  Name: Jose Harvey MRN: [id number] Date of Birth: July 11, 1960 Referring Provider (PT): Reita May Date: 09/02/2020   PT End of Session - 09/02/20 0923    Visit Number 2    Number of Visits 2    Date for PT Re-Evaluation 09/02/20    PT Start Time 0904    PT Stop Time 0923    PT Time Calculation (min) 19 min    Activity Tolerance Patient tolerated treatment well    Behavior During Therapy [facility] for tasks assessed/performed           Past Medical History:  Diagnosis Date  . Adrenal adenoma, left 03/2017   noted on CT  . Allergic rhinitis, cause unspecified   . Anxiety state, unspecified   . Aortic atherosclerosis ([place]) 03/2017   noted on CT  . BPV (benign positional vertigo)   . COPD (chronic obstructive pulmonary disease) with emphysema (HCC)    mild followed by [facility]   . Coronary artery disease   . Depressive disorder, not elsewhere classified   . Dyspnea    history of  . ED (erectile dysfunction)   . Gallbladder polyp 03/2016   noted on CT  . Hepatic hemangioma 03/2017   noted on CT  . History of kidney stones    hx of years ago   . Indigestion   . 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 Mixed hyperlipidemia   . Prostate cancer ([place])   . 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 Type I (juvenile type) diabetes mellitus without mention of complication, not stated as uncontrolled    type I - followed by Dr Buddy Duty   . Vesicoureteral reflux     Past Surgical History:  Procedure Laterality Date  . CHOLECYSTECTOMY N/A 05/17/2016   Procedure: LAPAROSCOPIC CHOLECYSTECTOMY;  Surgeon: Clovis Riley, MD;  Location: [facility];  Service: General;  Laterality: N/A;  . cyst  removed from throat     . dental surgeries     . IR GASTROSTOMY TUBE MOD SED  07/07/2020  . IR IMAGING GUIDED PORT INSERTION  07/07/2020  . LYMPHADENECTOMY Bilateral 07/02/2017   Procedure: BILATERAL LYMPHADENECTOMY;  Surgeon: Lucas Mallow, MD;  Location: [facility];  Service: Urology;  Laterality: Bilateral;  . PROSTATE BIOPSY    . ROBOT ASSISTED LAPAROSCOPIC RADICAL PROSTATECTOMY N/A 07/02/2017   Procedure: XI ROBOTIC ASSISTED LAPAROSCOPIC RADICAL PROSTATECTOMY;  Surgeon: Lucas Mallow, MD;  Location: [facility];  Service: Urology;  Laterality: N/A;  . TONSILLECTOMY    . VIDEO ASSISTED THORACOSCOPY (VATS)/ LOBECTOMY Right 05/08/2019   Procedure: Right VIDEO ASSISTED THORACOSCOPY with   MIDDLE LOBECTOMY and Enbloc portion of Upper Lobe with Node dissection, Intercostal Nerve Block;  Surgeon: Melrose Nakayama, MD;  Location: [facility];  Service: Thoracic;  Laterality: Right;  . WISDOM TOOTH EXTRACTION      There were no vitals filed for this visit.   Subjective Assessment - 09/02/20 0905    Subjective It has been 2 weeks since I finished radiation. I have sore throat from it. I think my range of motion is pretty good. I have not noticed any swelling. I have been able to open my mouth better.    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 Yes    Pain Score 3     Pain Location Throat    Pain Orientation Right    Pain Descriptors /  Indicators Sore    Pain Type Acute pain    Pain Onset 1 to 4 weeks ago    Pain Frequency Intermittent    Aggravating Factors  swallowing    Pain Relieving Factors Advil    Effect of Pain on Daily Activities hard to swallow              [facility] PT Assessment - 09/02/20 0001      Assessment   Medical Diagnosis SCC of tongue      Balance Screen   Has the patient fallen in the past 6 months No    Has the patient had a decrease in activity level because of a fear of falling?  No    Is the patient reluctant to leave their home because of a fear of falling?  No      Prior Function   Level of Independence Independent    Leisure pt has not been exercising      Sit to Stand   Comments 14 reps in 30 seconds avg is 15      AROM   Cervical Flexion WFL    Cervical Extension WFL    Cervical - Right Side Bend WFL    Cervical - Left Side Bend WFL    Cervical - Right Rotation WFL    Cervical - Left Rotation WFL             LYMPHEDEMA/ONCOLOGY QUESTIONNAIRE - 09/02/20 0001      Head and Neck   4 cm superior to sternal notch around neck 39.7 cm    6 cm superior to sternal notch around neck 39.4 cm    8 cm superior to sternal notch around neck 39.8 cm                                   PT Long Term Goals - 09/02/20 4315      PT LONG TERM GOAL #1   Title Pt will return to baseline ROM measurements and not demonstrate any signs or symptoms of lymphedema.    Time 8    Period Weeks    Status Achieved                 Plan - 09/02/20 0926    Clinical Impression Statement Pt returns to PT after completing chemo and radiation for treatment of squamous cell carcinoma of tongue, p16+. Reassessed pt's cervical ROM and it is still [facility]. He reports he has not been doing his head and neck stretches so educated pt on importance of being compliant with stretches to avoid loosing end range of motion. Remeasured circumferences of neck and his circumferences have  decreased since eval -  pt lost weight through treatment. He does not demonstrate any signs or symptoms of lymphedema. He was able to complete the same number of sit to stands in 30 seconds. Educated pt in importance of exercising (walking) at moderate pace to help build endurance because pt did feel that his legs were more tired today after 30 sec sit to stand. Pt encourage to follow up with PT if he continues to feel fatigued or develops any signs or symptoms of lymphedema.    PT Frequency --   eval and 1 f/u   PT Duration 8 weeks    PT Treatment/Interventions ADLs/Self Care Home Management;Patient/family education;Therapeutic exercise    PT Next Visit Plan d/c this visit    PT Home Exercise Plan head and neck ROM exercises, walking program    Consulted and Agree with Plan of Care Patient           Patient will benefit from skilled therapeutic intervention in order to improve the following deficits and impairments:  Postural dysfunction,Decreased knowledge of precautions  Visit Diagnosis: Abnormal posture  Malignant neoplasm of base of tongue ([place])     Problem List Patient Active Problem List   Diagnosis Date Noted  . Port-A-Cath in place 07/13/2020  . Counseling regarding advance care planning and goals of care 06/29/2020  . Cancer of base of tongue ([place]) 06/11/2020  . S/P thoracotomy 05/08/2019  . Prostate cancer ([place]) 07/02/2017  . Malignant neoplasm of prostate ([place]) 06/06/2017  . DOE (dyspnea on exertion) 07/28/2014  . COPD (chronic obstructive pulmonary disease) ([place]) 01/30/2014  . Throat fullness 01/30/2014  . Paroxysmal nocturnal dyspnea 11/17/2013  . Hyperlipidemia 11/17/2013  . Insulin dependent diabetes mellitus 11/17/2013  . Essential hypertension 11/17/2013    Allyson Sabal [facility] 09/02/2020, 9:30 AM  [place], [place], [address] Phone: [phone]   Fax:  [phone]  Name:  Jose Harvey MRN: [id number] Date of Birth: 14-Jun-1960  Manus Gunning, PT 09/02/20 9:31 AM  PHYSICAL THERAPY DISCHARGE SUMMARY  Visits from Start of Care: 2  Current functional level related to goals / functional outcomes: All goals met   Remaining deficits: None   Education / Equipment: HEP Plan: Patient agrees to discharge.  Patient goals were met. Patient is being discharged due to meeting the stated rehab goals.  ?????     Allyson Sabal [place], [place] 09/02/20 9:31 AM

## 2020-09-03 ENCOUNTER — Other Ambulatory Visit: Payer: Self-pay

## 2020-09-03 ENCOUNTER — Ambulatory Visit
Admission: RE | Admit: 2020-09-03 | Discharge: 2020-09-03 | Disposition: A | Discharge status: Home / Self Care | Payer: BC Managed Care – PPO | Source: Ambulatory Visit | Attending: Radiation Oncology | Admitting: Radiation Oncology

## 2020-09-03 VITALS — BP 106/68 | HR 87 | Temp 96.3°F | Resp 17 | Ht 71.0 in | Wt 167.5 lb

## 2020-09-03 DIAGNOSIS — Z9221 Personal history of antineoplastic chemotherapy: Secondary | ICD-10-CM | POA: Insufficient documentation

## 2020-09-03 DIAGNOSIS — C61 Malignant neoplasm of prostate: Secondary | ICD-10-CM | POA: Insufficient documentation

## 2020-09-03 DIAGNOSIS — Z79899 Other long term (current) drug therapy: Secondary | ICD-10-CM | POA: Diagnosis not present

## 2020-09-03 DIAGNOSIS — Z7982 Long term (current) use of aspirin: Secondary | ICD-10-CM | POA: Diagnosis not present

## 2020-09-03 DIAGNOSIS — Z794 Long term (current) use of insulin: Secondary | ICD-10-CM | POA: Diagnosis not present

## 2020-09-03 DIAGNOSIS — Z923 Personal history of irradiation: Secondary | ICD-10-CM | POA: Insufficient documentation

## 2020-09-03 DIAGNOSIS — C01 Malignant neoplasm of base of tongue: Secondary | ICD-10-CM | POA: Diagnosis not present

## 2020-09-03 NOTE — Progress Notes (Addendum)
[name] presents today for 2 week follow-up after completing radiation to his base of tongue on 08/20/2020  Pain issues, if any: On average reports throat pain is 2-3 out of 10; occasionally reports it can get as high as a 6 out of 10. Is typically able to manage with OTC Advil. Using a feeding tube?: Yes--using exclusively for nutrition. Reports he does 3 feedings a day (with 2 cartons at each feeding) Weight changes, if any:  Wt Readings from Last 3 Encounters:  09/03/20 167 lb 8 oz (76 kg)  09/01/20 164 lb 11.2 oz (74.7 kg)  08/10/20 168 lb 12.8 oz (76.6 kg)   Swallowing issues, if any: Yes--only able to tolerate thin liquids occasionally. Using PEG exclusively for nutrition Smoking or chewing tobacco? None Using fluoride trays daily? N/A Last ENT visit was on: Not since diagnosis  Other notable issues, if any: F/U with Dr. Irene Lim[place] on 09/29/2020. Denies any mouth sores or ulcers. Denies any ear or jaw pain, and feels his range of motion to his jaw has greatly improved. Continues to deal dry mouth and thick saliva. Reports fatigue seems to be lessening. Denies any issues with swelling or lymphedema to his jaw or neck. Reports skin is healing well; remains slightly pink in treatment field, but is intact and without drainage or blistering. Overall reports he feels good and is pleased with his progress thus far  Vitals:   09/03/20 1032  BP: 106/68  Pulse: 87  Resp: 17  Temp: (!) 96.3 F (35.7 C)  SpO2: 99%

## 2020-09-03 NOTE — Progress Notes (Signed)
Oncology Nurse Navigator Documentation  I met with Mr. Jose Harvey and his wife during his follow up with Dr. Isidore Moos today. He reports recovering well from his treatment for head and neck cancer. He was scheduled for an appointment with Dr. Isidore Moos on 7/8 to receive results of a PET scan which will be completed the day before. They know to call me if he has any further needs or questions.   Harlow Asa RN, BSN, OCN Head & Neck Oncology Nurse [place] at [facility] Phone # [phone]  Fax # [phone]

## 2020-09-07 DIAGNOSIS — E103293 Type 1 diabetes mellitus with mild nonproliferative diabetic retinopathy without macular edema, bilateral: Secondary | ICD-10-CM | POA: Diagnosis not present

## 2020-09-07 DIAGNOSIS — Z794 Long term (current) use of insulin: Secondary | ICD-10-CM | POA: Diagnosis not present

## 2020-09-08 ENCOUNTER — Encounter: Payer: Self-pay | Admitting: Radiation Oncology

## 2020-09-08 NOTE — Progress Notes (Signed)
Radiation Oncology         [phone] ________________________________  Name: Jose Harvey MRN: [id number]  Date: 09/03/2020  DOB: Aug 06, 1960  Follow-Up Visit Note  CC: Jose Cha, MD  Jose Genera, MD  Diagnosis and Prior Radiotherapy:       ICD-10-CM   1. Cancer of base of tongue ([place])  C01     Cancer Staging Cancer of base of tongue ([place]) Staging form: Pharynx - HPV-Mediated Oropharynx, AJCC 8th Edition - Clinical stage from 06/11/2020: Stage I (cT2, cN1, cM0, p16+) - Signed by Jose Gibson, MD on 06/11/2020 Stage prefix: Initial diagnosis  Malignant neoplasm of prostate [facility]) Staging form: Prostate, AJCC 8th Edition - Clinical: Stage IIC (cT2a, cN0, cM0, PSA: 4.1, Grade Group: 3) - Signed by Tyler Pita, MD on 06/06/2017 Prostate specific antigen (PSA) range: Less than 10 Gleason score: 7 Histologic grading system: 5 grade system   CHIEF COMPLAINT:  Here for follow-up and surveillance of throat cancer  Narrative:  The patient returns today for routine follow-up.   Mr. Jose Harvey presents today for 2 week follow-up after completing radiation to his base of tongue on 08/20/2020  Pain issues, if any: On average reports throat pain is 2-3 out of 10; occasionally reports it can get as high as a 6 out of 10. Is typically able to manage with OTC Advil. Using a feeding tube?: Yes--using exclusively for nutrition. Reports he does 3 feedings a day (with 2 cartons at each feeding) Weight changes, if any:  Wt Readings from Last 3 Encounters:  09/03/20 167 lb 8 oz (76 kg)  09/01/20 164 lb 11.2 oz (74.7 kg)  08/10/20 168 lb 12.8 oz (76.6 kg)   Swallowing issues, if any: Yes--only able to tolerate thin liquids occasionally. Using PEG exclusively for nutrition Smoking or chewing tobacco? None Using fluoride trays daily? N/A Last ENT visit was on: Not since diagnosis  Other notable issues, if any: F/U with Dr. Irene Harvey on 09/29/2020. Denies any mouth sores or  ulcers. Denies any ear or jaw pain, and feels his range of motion to his jaw has greatly improved. Continues to deal dry mouth and thick saliva. Reports fatigue seems to be lessening. Denies any issues with swelling or lymphedema to his jaw or neck. Reports skin is healing well; remains slightly pink in treatment field, but is intact and without drainage or blistering. Overall reports he feels good and is pleased with his progress thus far  Vitals:   09/03/20 1032  BP: 106/68  Pulse: 87  Resp: 17  Temp: (!) 96.3 F (35.7 C)  SpO2: 99%                      ALLERGIES:  is allergic to crestor [rosuvastatin], lipitor [atorvastatin], other, and zocor [simvastatin].  Meds: Current Outpatient Medications  Medication Sig Dispense Refill  . lidocaine (XYLOCAINE) 2 % solution Patient: Mix 1part 2% viscous lidocaine, 1part H20. Swish & swallow 16mL of diluted mixture, 33min before meals and at bedtime, up to QID 200 mL 4  . aspirin 81 MG tablet Take 1 tablet (81 mg total) by mouth daily. 30 tablet   . cetirizine (ZYRTEC) 10 MG tablet Take 10 mg by mouth daily.    . chlorhexidine (PERIDEX) 0.12 % solution Use as directed 15 mLs in the mouth or throat daily.    . Coenzyme Q10 (COQ10) 100 MG CAPS Take 100 mg by mouth daily.    . Continuous Blood Gluc Sensor (  FREESTYLE LIBRE 14 DAY SENSOR) MISC AS DIRECTED CHANGE SENSOR EVERY 14 DAYS 28    . dexamethasone (DECADRON) 4 MG tablet Take 2 tablets (8 mg total) by mouth daily. Start the day after chemotherapy for 2 days. 30 tablet 1  . ibuprofen (ADVIL) 200 MG tablet Take 400-800 mg by mouth every 6 (six) hours as needed for moderate pain. (Patient not taking: Reported on 06/17/2020)    . Insulin Human (INSULIN PUMP) SOLN Inject into the skin continuous. NOVOLOG 100 UNIT/ML VIAL    . lidocaine-prilocaine (EMLA) cream Apply to affected area once 30 g 3  . lisinopril (PRINIVIL,ZESTRIL) 5 MG tablet Take 5 mg by mouth daily.     Marland Kitchen LORazepam (ATIVAN) 0.5 MG tablet  Take 1 tablet (0.5 mg total) by mouth every 6 (six) hours as needed (Nausea or vomiting). 30 tablet 0  . Multiple Vitamins-Minerals (ICAPS AREDS 2) CAPS Take 1 capsule by mouth daily.    Marland Kitchen NOVOLOG 100 UNIT/ML injection Averages about 30 units per day via insulin pump    . Nutritional Supplements (FEEDING SUPPLEMENT, GLUCERNA 1.5 CAL,) LIQD Give 2 cartons at 8am, noon and 4pm. Give 1 carton at 8pm. Flush with 45ml of water before and after.  Drink or give additional water via tube of 228ml 3 times per day.  Meets 100% of needs.  Send bolus supplies. 1659 mL 5  . ondansetron (ZOFRAN) 8 MG tablet Take 1 tablet (8 mg total) by mouth 2 (two) times daily as needed for refractory nausea / vomiting. Start on day 3 after chemo. (Patient not taking: Reported on 09/01/2020) 30 tablet 1  . oxyCODONE (ROXICODONE) 5 MG/5ML solution Take 5 mLs (5 mg total) by mouth every 6 (six) hours as needed for severe pain. 200 mL 0  . pantoprazole (PROTONIX) 40 MG tablet Take 40 mg by mouth daily.    . prochlorperazine (COMPAZINE) 10 MG tablet Take 1 tablet (10 mg total) by mouth every 6 (six) hours as needed (Nausea or vomiting). 30 tablet 1  . rosuvastatin (CRESTOR) 5 MG tablet Take 1 tablet (5 mg total) by mouth daily. 90 tablet 3  . sertraline (ZOLOFT) 100 MG tablet Take 50 mg by mouth daily.  0  . sucralfate (CARAFATE) 1 g tablet Take 1 tablet (1 g total) by mouth 4 (four) times daily -  with meals and at bedtime. (Patient not taking: Reported on 09/01/2020) 90 tablet 1   No current facility-administered medications for this encounter.    Physical Findings: The patient is in no acute distress. Patient is alert and oriented. Wt Readings from Last 3 Encounters:  09/03/20 167 lb 8 oz (76 kg)  09/01/20 164 lb 11.2 oz (74.7 kg)  08/10/20 168 lb 12.8 oz (76.6 kg)    height is 5\' 11"  (1.803 m) and weight is 167 lb 8 oz (76 kg). His temporal temperature is 96.3 F (35.7 C) (abnormal). His blood pressure is 106/68 and his  pulse is 87. His respiration is 17 and oxygen saturation is 99%. .  General: Alert and oriented, in no acute distress HEENT: Head is normocephalic. Extraocular movements are intact. Oropharynx is notable for no thrush, excellent healing of mucosa Neck: No bulky adenopathy Skin: Skin in treatment fields shows satisfactory healing  Psychiatric: Judgment and insight are intact. Affect is appropriate.   Lab Findings: Lab Results  Component Value Date   WBC 6.5 09/01/2020   HGB 12.2 (L) 09/01/2020   HCT 36.7 (L) 09/01/2020   MCV 92.0  09/01/2020   PLT 371 09/01/2020    Lab Results  Component Value Date   TSH 1.022 05/11/2019    Radiographic Findings: No results found.  Impression/Plan:    1) Head and Neck Cancer Status: Healing well from chemoradiation  2) Nutritional Status:  Wt Readings from Last 3 Encounters:  09/03/20 167 lb 8 oz (76 kg)  09/01/20 164 lb 11.2 oz (74.7 kg)  08/10/20 168 lb 12.8 oz (76.6 kg)   He has done an excellent job maintaining his weight PEG tube: Still using  3)  Swallowing: Continue speech-language pathology exercises  4) Dental: Encouraged to continue regular followup with dentistry, and dental hygiene including fluoride rinses.   5) Thyroid function: Check annually Lab Results  Component Value Date   TSH 1.022 05/11/2019    6) Other: Follow-up in 3 months with restaging PET scan.  Call if any questions or concerns arise in the interim.  Follow as scheduled with medical oncology in the interim.  On date of service, in total, I spent 20 minutes on this encounter. Patient was seen in person. _____________________________________   Jose Gibson, MD

## 2020-09-09 ENCOUNTER — Other Ambulatory Visit: Payer: Self-pay

## 2020-09-09 ENCOUNTER — Ambulatory Visit: Payer: BC Managed Care – PPO | Attending: Radiation Oncology

## 2020-09-09 DIAGNOSIS — R1310 Dysphagia, unspecified: Secondary | ICD-10-CM | POA: Diagnosis not present

## 2020-09-09 NOTE — Patient Instructions (Signed)
Signs of Aspiration Pneumonia   . Chest pain/tightness . Fever (can be low grade) . Cough  o With foul-smelling phlegm (sputum) o With sputum containing pus or blood o With greenish sputum . Fatigue  . Shortness of breath  . Wheezing   **IF YOU HAVE THESE SIGNS, CONTACT YOUR DOCTOR OR GO TO THE EMERGENCY DEPARTMENT OR URGENT CARE AS SOON AS POSSIBLE**      

## 2020-09-09 NOTE — Therapy (Signed)
[facility] [address] [place], [place], [address] Phone: [phone]   Fax:  [phone]  Speech Language Pathology Treatment  Patient Details  Name: Jose Harvey MRN: [id number] Date of Birth: 1961-03-22 Referring Provider (SLP): Eppie Gibson, MD   Encounter Date: 09/09/2020   End of Session - 09/09/20 1633    Visit Number 3    Number of Visits 7    Date for SLP Re-Evaluation 10/06/20    SLP Start Time 1404    SLP Stop Time  8182    SLP Time Calculation (min) 33 min    Activity Tolerance Patient tolerated treatment well           Past Medical History:  Diagnosis Date  . Adrenal adenoma, left 03/2017   noted on CT  . Allergic rhinitis, cause unspecified   . Anxiety state, unspecified   . Aortic atherosclerosis ([place]) 03/2017   noted on CT  . BPV (benign positional vertigo)   . COPD (chronic obstructive pulmonary disease) with emphysema (HCC)    mild followed by [facility]   . Coronary artery disease   . Depressive disorder, not elsewhere classified   . Dyspnea    history of  . ED (erectile dysfunction)   . Gallbladder polyp 03/2016   noted on CT  . Hepatic hemangioma 03/2017   noted on CT  . History of kidney stones    hx of years ago   . Indigestion   . 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 Mixed hyperlipidemia   . Prostate cancer ([facility])   . 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 Type I (juvenile type) diabetes mellitus without mention of complication, not stated as uncontrolled    type I - followed by Dr Buddy Duty   . Vesicoureteral reflux     Past Surgical History:  Procedure Laterality Date  . CHOLECYSTECTOMY N/A 05/17/2016   Procedure: LAPAROSCOPIC CHOLECYSTECTOMY;  Surgeon: Clovis Riley, MD;  Location: [facility];  Service: General;  Laterality: N/A;  . cyst removed from throat     . dental  surgeries     . IR GASTROSTOMY TUBE MOD SED  07/07/2020  . IR IMAGING GUIDED PORT INSERTION  07/07/2020  . LYMPHADENECTOMY Bilateral 07/02/2017   Procedure: BILATERAL LYMPHADENECTOMY;  Surgeon: Lucas Mallow, MD;  Location: [facility];  Service: Urology;  Laterality: Bilateral;  . PROSTATE BIOPSY    . ROBOT ASSISTED LAPAROSCOPIC RADICAL PROSTATECTOMY N/A 07/02/2017   Procedure: XI ROBOTIC ASSISTED LAPAROSCOPIC RADICAL PROSTATECTOMY;  Surgeon: Lucas Mallow, MD;  Location: [facility];  Service: Urology;  Laterality: N/A;  . TONSILLECTOMY    . VIDEO ASSISTED THORACOSCOPY (VATS)/ LOBECTOMY Right 05/08/2019   Procedure: Right VIDEO ASSISTED THORACOSCOPY with   MIDDLE LOBECTOMY and Enbloc portion of Upper Lobe with Node dissection, Intercostal Nerve Block;  Surgeon: Melrose Nakayama, MD;  Location: [facility];  Service: Thoracic;  Laterality: Right;  . WISDOM TOOTH EXTRACTION      There were no vitals filed for this visit.   Subjective Assessment - 09/09/20 1408    Subjective Tried some things since done with chemorad tx. Difficult mostly due to dysgeusia.    Currently in Pain? Yes    Pain Score 5     Pain Location Throat    Pain Orientation Right    Pain Descriptors / Indicators Sore    Pain Type Acute pain  Pain Onset 1 to 4 weeks ago    Pain Frequency Intermittent                 ADULT SLP TREATMENT - 09/09/20 1412      General Information   Behavior/Cognition Alert;Cooperative;Pleasant mood      Treatment Provided   Treatment provided Dysphagia      Dysphagia Treatment   Temperature Spikes Noted No    Respiratory Status Room air    Treatment Methods Therapeutic exercise;Skilled observation;Patient/caregiver education;Compensation strategy training    Patient observed directly with PO's Yes    Type of PO's observed Regular;Thin liquids    Pharyngeal Phase Signs & Symptoms Immediate throat clear   after second swallow   Other treatment/comments Replaced Shaker (which pt  was not completing) with chin tuck against resistnace exercise. Pt reprots suboptimal completion of HEP - just doing effrotful, mendelsohn, and a few [place] and chin tuck against resistance. SLP reiterated scope of HEP and BID completion. Pt completed HEP with rare min A. SLP provided overt s/sx aspiration PNA and pt told SLP 3 of them with modified independence. Pt told SLP the rationale without cues.      Assessment / Recommendations / Plan   Plan Continue with current plan of care      Progression Toward Goals   Progression toward goals Not progressing toward goals (comment)   cont to have suboptimal completion of HEP           SLP Education - 09/09/20 1632    Education Details aspiration PNA s/sx, HEP procedure    Person(s) Educated Patient    Methods Explanation;Handout;Demonstration;Verbal cues    Comprehension Verbalized understanding;Returned demonstration;Need further instruction;Verbal cues required            SLP Short Term Goals - 09/09/20 1437      SLP SHORT TERM GOAL #1   Title Pt will complete HEP with rare min A over two sessions    Baseline 09-09-20    Time 1    Period --   visits, for all STGs   Status On-going      SLP SHORT TERM GOAL #2   Title Pt will tell SLP rationale for HEP completion over two sessions    Baseline 09-09-20    Time 1    Status On-going      SLP SHORT TERM GOAL #3   Title Pt will tell SLP how a food journal can hasten/facilitate return to more normalized diet    Time 1    Status On-going            SLP Long Term Goals - 09/09/20 1630      SLP LONG TERM GOAL #1   Title Pt will complete HEP with modified independence over 3 visits    Time 2    Status On-going      SLP LONG TERM GOAL #2   Title Pt will tell SLP 3 overt s/s aspiration PNA with modified independence    Status Achieved      SLP LONG TERM GOAL #3   Title Pt will tell SLP when to decr frequency of HEP    Time 4    Status On-going            Plan - 09/09/20  1633    Clinical Impression Statement Jose Harvey presents today with WNL/WFL swallowing ability with dys III and thin liquids.  No overt s/sx oral or pharyngeal deficits, or of aspiration PNA reported  or observed today. Data suggests that as pts progress through rad or chemorad therapy that their swallowing ability will decrease. 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 SLP reviewed Jose Harvey' indivdualized exercise program to mitigate/eliminate pt's muscle fibrosis following and as a result of, pt's rad tx. Skilled ST would cont to be beneficial to the pt in order to regularly assess pt's safety with POs and/or need for instrumental swallow assessment, as well as to assess accurate completion of swallowing HEP.    Speech Therapy Frequency --   once every approx 4 weeks   Duration --   7 total visits   Treatment/Interventions Aspiration precaution training;Pharyngeal strengthening exercises;Diet toleration management by SLP;Trials of upgraded texture/liquids;Internal/external aids;Patient/family education;Compensatory strategies;SLP instruction and feedback    Potential to Achieve Goals Good    SLP Home Exercise Plan provided today    Consulted and Agree with Plan of Care Patient           Patient will benefit from skilled therapeutic intervention in order to improve the following deficits and impairments:   Dysphagia, unspecified type    Problem List Patient Active Problem List   Diagnosis Date Noted  . Port-A-Cath in place 07/13/2020  . Counseling regarding advance care planning and goals of care 06/29/2020  . 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 S/P thoracotomy 05/08/2019  . Prostate cancer ([facility]) 07/02/2017  . Malignant neoplasm of prostate ([place]) 06/06/2017  . DOE (dyspnea on exertion) 07/28/2014  . COPD (chronic obstructive pulmonary disease) ([facility]) 01/30/2014  . Throat fullness 01/30/2014  . Paroxysmal nocturnal  dyspnea 11/17/2013  . Hyperlipidemia 11/17/2013  . Insulin dependent diabetes mellitus 11/17/2013  . Essential hypertension 11/17/2013    [facility] ,[place], [place]  09/09/2020, 4:37 PM  [facility] [address] [place], [place], [address] Phone: [phone]   Fax:  [phone]   Name: Jose Harvey MRN: [id number] Date of Birth: 02/06/1961

## 2020-09-17 DIAGNOSIS — H35033 Hypertensive retinopathy, bilateral: Secondary | ICD-10-CM | POA: Diagnosis not present

## 2020-09-17 DIAGNOSIS — E103393 Type 1 diabetes mellitus with moderate nonproliferative diabetic retinopathy without macular edema, bilateral: Secondary | ICD-10-CM | POA: Diagnosis not present

## 2020-09-17 DIAGNOSIS — H2513 Age-related nuclear cataract, bilateral: Secondary | ICD-10-CM | POA: Diagnosis not present

## 2020-09-17 DIAGNOSIS — E1036 Type 1 diabetes mellitus with diabetic cataract: Secondary | ICD-10-CM | POA: Diagnosis not present

## 2020-09-22 NOTE — Progress Notes (Signed)
  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-Malignant neoplasm of base of tongue C79.89-Secondary malignant neoplasm of other specified sites  Cancer Staging: Cancer Staging Cancer of base of tongue ([place]) Staging form: Pharynx - HPV-Mediated Oropharynx, AJCC 8th Edition - Clinical stage from 06/11/2020: Stage I (cT2, cN1, cM0, p16+) - Signed by Eppie Gibson, MD on 06/11/2020 Stage prefix: Initial diagnosis  Malignant neoplasm of prostate [facility]) Staging form: Prostate, AJCC 8th Edition - Clinical: Stage IIC (cT2a, cN0, cM0, PSA: 4.1, Grade Group: 3) - Signed by Tyler Pita, MD on 06/06/2017 Prostate specific antigen (PSA) range: Less than 10 Gleason score: 7 Histologic grading system: 5 grade system    Intent: Curative  Radiation Treatment Dates: 07/05/2020 through 08/20/2020 Site Technique Total Dose (Gy) Dose per Fx (Gy) Completed Fx Beam Energies  Oropharynx: HN_BOT IMRT 70/70 2 35/35 6X   Narrative: The patient tolerated radiation therapy relatively well.   Plan: The patient will follow-up with radiation oncology in 2-3wks .  -----------------------------------  Eppie Gibson, MD

## 2020-09-28 NOTE — Progress Notes (Signed)
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 Pt is accompanied today by his sister. The pt reports that he is doing well overall.  The pt reports that he was a long-term smoker and due to that he has been a participant in a lung cancer screening program. While doing routine monitoring small lung nodules were first noticed in 2017. They did not feel that action needed to be taken at that time so they simply continued monitoring him. A significant change was noticed within the last 6 months. Pt denies any fevers, chills, night sweats, SOB during this time. Pt had a Thoracoscopy with Middle Lobectomy with Dr. Orson AloeHenderson on 05/08/2019. He currently feels well and denies any of the aforementioned symptoms except an increase in SOB, noticed after surgery.   Pt quit smoking about 10 years ago. He was diagnosed with COPD in 2018 and given an inhaler. He did not notice a difference with the inhaler so he has since stopped using it. Pt has not had any issues with vertigo in sometime and his diabetes has been well controlled. Pt has a history of Prostate Cancer and had it resected in January of 2019. Pt also had a cholecystectomy, which they decided on due to activity seen on 2017 PET scan. He denies any issues  resulting from his gallbladder removal. Diabetes and heart disease runs in his family. His father had prostate cancer and heart disease as well.   Pt will return to work in Spring. There is dust and floating debris at his job but he is not around it very often. Pt has stayed up to date with his annual flu vaccine as well as his pneumonia vaccines.   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 Masslike area of ground-glass straddles the minor fissure with septal thickening and fissural retraction. While the lesion has increased in size minimally from 09/30/2018, there is clear enlargement from baseline examination on 02/16/2016. Therefore, lesion is characterized as worrisome for adenocarcinoma, Lung-RADS 4B, suspicious. Additional imaging evaluation or consultation with Pulmonology or Thoracic Surgery recommended. These results will be called to the ordering clinician or representative by the Radiologist Assistant, and communication documented in the PACS or zVision Dashboard. 2. Liver hemangiomas and left adrenal adenoma, best characterized on 03/03/2016. 3. Aortic atherosclerosis (ICD10-170.0). Coronary artery calcification."  Pt has had PET/CT ([id number]) completed on 02/25/2016 with results revealing "Dominant 8 mm right lower lobe pulmonary nodule shows no significant FDG uptake. Other scattered less than 5 mm bilateral pulmonary nodules also show no FDG uptake but are too small to characterize by PET. Recommend followup  by chest CT in 6 months. (please use the following order, "CT CHEST LCS NODULE FOLLOW-UP W/O CM") Right middle lobe ground-glass  opacity shows low-grade FDG uptake, and differential diagnosis includes inflammatory or infectious processes and low-grade adenocarcinoma. Recommend followup by chest CT in 6 months.  Multiple low-attenuation liver masses show no hypermetabolic activity compared to normal hepatic parenchyma. These remain indeterminate and cannot be characterized without IV contrast. Abdomen MRI without and with contrast recommended for further evaluation. 2.5 cm left adrenal mass shows mild FDG uptake, and remains indeterminate. Differential diagnosis includes atypical adenoma, metastasis, pheochromocytoma, or less likely adrenal cortical carcinoma. This can also be further evaluated with abdomen MRI without and with contrast."  Most recent lab results (05/12/2019) of CBC is as follows: all values are WNL except for RBC at 3.62, Hgb at 11.2, HCT at 34.2.  On review of systems, pt reports increased SOB and denies testicular pain/swelling, leg swelling, fevers, chills, night sweats and any other symptoms.   On PMHx the pt reports COPD with emphysema, Benign Positional Vertigo, Diabetes, Prostate Cancer, Prostatectomy (2019), Cholecystectomy. On Social Hx the pt reports he is a former smoker who quit 10 years ago On Family Hx the pt reports that his father had Prostate Cancer and Heart Disease, additional family history of Heart Disease and Diabetes.  INTERVAL HISTORY:   Jose Harvey is a wonderful 60 y.o. male who is here for evaluation and management of recently diagnosed head and neck squamous cell carcinoma. [name] last visit with [place] was on 09/01/2020. The pt reports that he is doing well overall.   The pt reports that he had been doing better since finishing radiation treatment. The pt notes that he recently in the last few days has been experiencing some throat pain and discomfort that has been affecting his ability to eat and drink. The pt notes that prior to this he was improving and getting better  gradually. The pt notes that he has been using more Advil and lidocaine than the hydrocodone. The pt notes that the type of food does not matter, even water bothers his throat when drinking. The pt notes some seasonal allergies that are improved with allergy medicine he takes daily, but denies any post-nasal drip or symptoms. The pt notes that 100% of food intake is from the tube outside of just tasting foods. He is tolerating this well. The pt notes that he has an intermittent cough when he is hot.  Lab results today 09/29/2020 of CBC w/diff and CMP is as follows: all values are WNL except for RBC of 3.75, Hgb of 11.8, Hct of 35.6, Lymphs Abs of 0.4K, Eosinophils Abs of 1.7K, Glucose of 175, BUN of 32, Calcium of 8.6, Total Protein of 5.7, Albumin of 3.0, ALT of 46. 09/29/2020 Magnesium of 1.9. 09/29/2020 LDH of 155.  On review of systems, pt reports throat pain, weight gain, improved bowel habits, intermittent cough due to heat, mild clear phlegm and denies fatigue, post-nasal drip, fevers, chills, changes in breathing, wheezing, back pain, and any other symptoms.   MEDICAL HISTORY:  Past Medical History:  Diagnosis Date  . Adrenal adenoma, left 03/2017   noted on CT  . Allergic rhinitis, cause unspecified   . Anxiety state, unspecified   . Aortic atherosclerosis ([place]) 03/2017   noted on CT  . BPV (benign positional vertigo)   . COPD (chronic obstructive pulmonary disease) with emphysema (HCC)    mild followed by [facility]   . Coronary  artery disease   . Depressive disorder, not elsewhere classified   . Dyspnea    history of  . ED (erectile dysfunction)   . Gallbladder polyp 03/2016   noted on CT  . Hepatic hemangioma 03/2017   noted on CT  . History of kidney stones    hx of years ago   . Indigestion   . 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 Mixed hyperlipidemia   . Prostate cancer (HCC)   . 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 Type I (juvenile type) diabetes mellitus without mention of complication, not stated as uncontrolled    type I - followed by Dr Sharl Ma   . Vesicoureteral reflux     SURGICAL HISTORY: Past Surgical History:  Procedure Laterality Date  . CHOLECYSTECTOMY N/A 05/17/2016   Procedure: LAPAROSCOPIC CHOLECYSTECTOMY;  Surgeon: Berna Bue, MD;  Location: [facility];  Service: General;  Laterality: N/A;  . cyst removed from throat     . dental surgeries     . IR GASTROSTOMY TUBE MOD SED  07/07/2020  . IR IMAGING GUIDED PORT INSERTION  07/07/2020  . LYMPHADENECTOMY Bilateral 07/02/2017   Procedure: BILATERAL LYMPHADENECTOMY;  Surgeon: Crista Elliot, MD;  Location: [facility];  Service: Urology;  Laterality: Bilateral;  . PROSTATE BIOPSY    . ROBOT ASSISTED LAPAROSCOPIC RADICAL PROSTATECTOMY N/A 07/02/2017   Procedure: XI ROBOTIC ASSISTED LAPAROSCOPIC RADICAL PROSTATECTOMY;  Surgeon: Crista Elliot, MD;  Location: [facility];  Service: Urology;  Laterality: N/A;  . TONSILLECTOMY    . VIDEO ASSISTED THORACOSCOPY (VATS)/ LOBECTOMY Right 05/08/2019   Procedure: Right VIDEO ASSISTED THORACOSCOPY with   MIDDLE LOBECTOMY and Enbloc portion of Upper Lobe with Node dissection, Intercostal Nerve Block;  Surgeon: Loreli Slot, MD;  Location: [facility];  Service: Thoracic;  Laterality: Right;  . WISDOM TOOTH EXTRACTION      SOCIAL HISTORY: Social History   Socioeconomic History  . Marital status: Divorced    Spouse name: Not on file  . Number of children: 2  . Years of education: Not on file  . 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 Smoking status: Former Smoker    Packs/day: 1.50    Years: 27.00    Pack years: 40.50    Types: Cigarettes    Quit date: 06/05/2008    Years since quitting: 12.3  . Smokeless tobacco: Former Neurosurgeon    Types: Snuff    Quit date: 2015  Vaping Use  .  Vaping Use: Never used  Substance and Sexual Activity  . Alcohol use: Yes    Alcohol/week: 6.0 standard drinks    Types: 6 Cans of beer per week    Comment: occasional 6pk per week  . Drug use: No  . Sexual activity: Not Currently  Other Topics Concern  . Not on file  Social History Narrative   Patient divorced. Patient has two grown daughters but neither lives locally.   Social Determinants of Health   Financial Resource Strain: Low Risk   . Difficulty of Paying Living Expenses: Not very hard  Food Insecurity: No Food Insecurity  . Worried About Programme researcher, broadcasting/film/video in the Last Year: Never true  . Ran Out of Food in the Last Year: Never true  Transportation Needs: No Transportation Needs  . Lack of Transportation (Medical): No  . Lack  of Transportation (Non-Medical): No  Physical Activity: Not on file  Stress: No Stress Concern Present  . Feeling of Stress : Only a little  Social Connections: Unknown  . Frequency of Communication with Friends and Family: More than three times a week  . Frequency of Social Gatherings with Friends and Family: More than three times a week  . Attends Religious Services: More than 4 times per year  . Active Member of Clubs or Organizations: Yes  . Attends Archivist Meetings: More than 4 times per year  . Marital Status: Not on file  Intimate Partner Violence: Not on file    FAMILY HISTORY: Family History  Problem Relation Age of Onset  . Diabetes type I Father   . Heart attack Father   . Hypertension Father   . Emphysema Father   . CAD Father   . Colon cancer Maternal Grandfather   . Emphysema Paternal Grandfather   . Diabetes type I Paternal Grandfather   . Kidney disease Neg Hx   . Liver disease Neg Hx   . Breast cancer Neg Hx   . Prostate cancer Neg Hx     ALLERGIES:  is allergic to crestor [rosuvastatin], lipitor [atorvastatin], other, and zocor [simvastatin].  MEDICATIONS:  Current Outpatient Medications  Medication  Sig Dispense Refill  . lidocaine (XYLOCAINE) 2 % solution Patient: Mix 1part 2% viscous lidocaine, 1part H20. Swish & swallow 62mL of diluted mixture, 40min before meals and at bedtime, up to QID 200 mL 4  . magic mouthwash w/lidocaine SOLN 28ml oral swish and swallow 4 times daily as needed  1 Part viscous lidocaine 2% 1 Part Maalox 1 Part diphenhydramine 12.5 mg per 5 ml elixir 1 part Nystatin 500000 units per 40ml 200 mL 2  . aspirin 81 MG tablet Take 1 tablet (81 mg total) by mouth daily. 30 tablet   . cetirizine (ZYRTEC) 10 MG tablet Take 10 mg by mouth daily.    . chlorhexidine (PERIDEX) 0.12 % solution Use as directed 15 mLs in the mouth or throat daily.    . Coenzyme Q10 (COQ10) 100 MG CAPS Take 100 mg by mouth daily.    . Continuous Blood Gluc Sensor (FREESTYLE LIBRE 14 DAY SENSOR) MISC AS DIRECTED CHANGE SENSOR EVERY 14 DAYS 28    . dexamethasone (DECADRON) 4 MG tablet Take 2 tablets (8 mg total) by mouth daily. Start the day after chemotherapy for 2 days. 30 tablet 1  . ibuprofen (ADVIL) 200 MG tablet Take 400-800 mg by mouth every 6 (six) hours as needed for moderate pain. (Patient not taking: Reported on 06/17/2020)    . Insulin Human (INSULIN PUMP) SOLN Inject into the skin continuous. NOVOLOG 100 UNIT/ML VIAL    . lidocaine-prilocaine (EMLA) cream Apply to affected area once 30 g 3  . lisinopril (PRINIVIL,ZESTRIL) 5 MG tablet Take 5 mg by mouth daily.     Marland Kitchen LORazepam (ATIVAN) 0.5 MG tablet Take 1 tablet (0.5 mg total) by mouth every 6 (six) hours as needed (Nausea or vomiting). 30 tablet 0  . Multiple Vitamins-Minerals (ICAPS AREDS 2) CAPS Take 1 capsule by mouth daily.    Marland Kitchen NOVOLOG 100 UNIT/ML injection Averages about 30 units per day via insulin pump    . Nutritional Supplements (FEEDING SUPPLEMENT, GLUCERNA 1.5 CAL,) LIQD Give 2 cartons at 8am, noon and 4pm. Give 1 carton at 8pm. Flush with 79ml of water before and after.  Drink or give additional water via tube of 226ml 3 times  per day.  Meets 100% of needs.  Send bolus supplies. 1659 mL 5  . ondansetron (ZOFRAN) 8 MG tablet Take 1 tablet (8 mg total) by mouth 2 (two) times daily as needed for refractory nausea / vomiting. Start on day 3 after chemo. (Patient not taking: Reported on 09/01/2020) 30 tablet 1  . oxyCODONE (ROXICODONE) 5 MG/5ML solution Take 5 mLs (5 mg total) by mouth every 6 (six) hours as needed for severe pain. 200 mL 0  . pantoprazole (PROTONIX) 40 MG tablet Take 40 mg by mouth daily.    . prochlorperazine (COMPAZINE) 10 MG tablet Take 1 tablet (10 mg total) by mouth every 6 (six) hours as needed (Nausea or vomiting). 30 tablet 1  . rosuvastatin (CRESTOR) 5 MG tablet Take 1 tablet (5 mg total) by mouth daily. 90 tablet 3  . sertraline (ZOLOFT) 100 MG tablet Take 50 mg by mouth daily.  0  . sucralfate (CARAFATE) 1 g tablet Take 1 tablet (1 g total) by mouth 4 (four) times daily -  with meals and at bedtime. (Patient not taking: Reported on 09/01/2020) 90 tablet 1   No current facility-administered medications for this visit.    REVIEW OF SYSTEMS:   10 Point review of Systems was done is negative except as noted above.   PHYSICAL EXAMINATION: ECOG PERFORMANCE STATUS: 0 - Asymptomatic   GENERAL:alert, in no acute distress and comfortable SKIN: no acute rashes, no significant lesions EYES: conjunctiva are pink and non-injected, sclera anicteric OROPHARYNX: MMM, no exudates, no oropharyngeal erythema or ulceration. Throat redness. NECK: supple, no JVD LYMPH:  no palpable lymphadenopathy in the cervical, axillary or inguinal regions LUNGS: clear to auscultation b/l with normal respiratory effort HEART: regular rate & rhythm ABDOMEN:  normoactive bowel sounds , non tender, not distended. Extremity: no pedal edema PSYCH: alert & oriented x 3 with fluent speech NEURO: no focal motor/sensory deficits  LABORATORY DATA:  I have reviewed the data as listed  . CBC Latest Ref Rng & Units 09/29/2020  09/01/2020 08/10/2020  WBC 4.0 - 10.5 K/uL 7.2 6.5 4.3  Hemoglobin 13.0 - 17.0 g/dL 11.8(L) 12.2(L) 12.2(L)  Hematocrit 39.0 - 52.0 % 35.6(L) 36.7(L) 35.3(L)  Platelets 150 - 400 K/uL 231 371 159    . CMP Latest Ref Rng & Units 09/29/2020 09/01/2020 08/10/2020  Glucose 70 - 99 mg/dL 175(H) 426(H) 277(H)  BUN 8 - 23 mg/dL 32(H) 49(H) 36(H)  Creatinine 0.61 - 1.24 mg/dL 0.76 0.93 0.84  Sodium 135 - 145 mmol/L 140 136 133(L)  Potassium 3.5 - 5.1 mmol/L 4.6 4.8 4.4  Chloride 98 - 111 mmol/L 106 99 99  CO2 22 - 32 mmol/L 26 26 26   Calcium 8.9 - 10.3 mg/dL 8.6(L) 8.4(L) 8.9  Total Protein 6.5 - 8.1 g/dL 5.7(L) 6.3(L) 6.6  Total Bilirubin 0.3 - 1.2 mg/dL 0.3 <0.2(L) 0.2(L)  Alkaline Phos 38 - 126 U/L 68 96 87  AST 15 - 41 U/L 29 35 30  ALT 0 - 44 U/L 46(H) 53(H) 52(H)     05/26/2020 Right Cervical LN Bx ([id number]):   05/08/2019 Surgical Pathology [id number]):    05/08/2019 Surgical Pathology [phone]):    RADIOGRAPHIC STUDIES: I have personally reviewed the radiological images as listed and agreed with the findings in the report. No results found.  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 Patient is appropriate to pursue-Fourth weekly dose of carboplatin Taxol. Ordered use of sucralfate to help with his radiation mucositis  -Counseled extensively on continued smoking cessation and avoiding alcohol use. -Continue to optimize nutrition through feeding tube. -Notes that he is  trying to stay well-hydrated.   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 Masslike area of ground-glass straddles the minor fissure with septal thickening and fissural retraction. While the lesion has increased in size minimally from 09/30/2018, there is clear enlargement from baseline examination on 02/16/2016. Therefore, lesion is characterized as worrisome for adenocarcinoma, Lung-RADS 4B, suspicious. -02/25/2016 PET/CT ([id number]) revealed "Dominant 8 mm right lower lobe pulmonary nodule shows no significant FDG uptake. Other scattered less than 5 mm bilateral pulmonary nodules also show no FDG uptake but are too small to characterize by PET. Right middle lobe ground-glass opacity shows low-grade FDG uptake, and differential diagnosis includes inflammatory or infectious processes and low-grade adenocarcinoma. Multiple low-attenuation liver masses show no hypermetabolic activity compared to normal hepatic parenchyma. These remain indeterminate and cannot be characterized without IV contrast. Abdomen MRI without and with contrast recommended for further evaluation. 2.5 cm left adrenal mass shows mild FDG uptake, and remains indeterminate. Differential diagnosis includes atypical adenoma, metastasis, pheochromocytoma, or less likely adrenal cortical carcinoma."  PLAN: -Discussed pt labwork today, 09/29/2020; blood counts stable. Magnesium normal, LDH normal. Chemistries stable and blood sugar improved. -The patient is currently over  1.5 months out from completing radiation treatment (03/18). Will get repeat PET three months post-radiation, thus sometime in mid-June. -Continue to exercise swallowing function by drinking water daily. -Continue Vitamin B Complex daily. -Advised pt to eat well, drink 48-64 oz water daily, and stay active. -Continue f/u with PCP to optimize DM2 management. -Continue f/u w Nutritionist and Speech Therapy. -Continue salt and baking soda mouth washes. Warm water, 1 tsp baking soda, 1 tsp salt 5-6x daily. -Advised pt that soreness and redness may be due to dryness and irritation. No obvious signs of thrush. -Rx Magic Mouthwash. -Advised pt to contact if worsening soreness in the case that we will do a short course of antibiotics. -Will see back in 2.5 months after completion of PET scan with labs.   FOLLOW UP: RTC w Dr Irene Limbo w portflush and labs in 10 weeks   The total time spent in the appointment was 20 minutes and more than 50% was on counseling and direct patient cares.    All of the patient's questions were answered with apparent satisfaction. The patient knows to call the clinic with any problems, questions or concerns.   Sullivan Lone MD [place] AAHIVMS [facility] Hematology/Oncology Physician [facility]  (Office):       [phone] (Work cell):  [phone] (Fax):  [phone]  09/29/2020 10:25 AM  I, Reinaldo Raddle, am acting as scribe for Dr. Sullivan Lone, MD.  .I have reviewed the above documentation for accuracy and completeness, and I agree with the above. Brunetta Genera MD

## 2020-09-29 ENCOUNTER — Other Ambulatory Visit: Payer: Self-pay

## 2020-09-29 ENCOUNTER — Inpatient Hospital Stay: Payer: BC Managed Care – PPO

## 2020-09-29 ENCOUNTER — Other Ambulatory Visit: Payer: BC Managed Care – PPO

## 2020-09-29 ENCOUNTER — Other Ambulatory Visit: Payer: Self-pay | Admitting: Hematology

## 2020-09-29 ENCOUNTER — Inpatient Hospital Stay: Payer: BC Managed Care – PPO | Attending: Hematology | Admitting: Hematology

## 2020-09-29 VITALS — BP 94/57 | HR 78 | Temp 97.0°F | Resp 20 | Ht 71.0 in | Wt 168.7 lb

## 2020-09-29 DIAGNOSIS — C884 Extranodal marginal zone B-cell lymphoma of mucosa-associated lymphoid tissue [MALT-lymphoma]: Secondary | ICD-10-CM | POA: Diagnosis not present

## 2020-09-29 DIAGNOSIS — Z8249 Family history of ischemic heart disease and other diseases of the circulatory system: Secondary | ICD-10-CM | POA: Diagnosis not present

## 2020-09-29 DIAGNOSIS — R0602 Shortness of breath: Secondary | ICD-10-CM | POA: Diagnosis not present

## 2020-09-29 DIAGNOSIS — Z800 Family history of malignant neoplasm of digestive organs: Secondary | ICD-10-CM | POA: Diagnosis not present

## 2020-09-29 DIAGNOSIS — Z7289 Other problems related to lifestyle: Secondary | ICD-10-CM | POA: Diagnosis not present

## 2020-09-29 DIAGNOSIS — C770 Secondary and unspecified malignant neoplasm of lymph nodes of head, face and neck: Secondary | ICD-10-CM | POA: Diagnosis not present

## 2020-09-29 DIAGNOSIS — Z87891 Personal history of nicotine dependence: Secondary | ICD-10-CM | POA: Insufficient documentation

## 2020-09-29 DIAGNOSIS — C01 Malignant neoplasm of base of tongue: Secondary | ICD-10-CM

## 2020-09-29 DIAGNOSIS — A630 Anogenital (venereal) warts: Secondary | ICD-10-CM | POA: Insufficient documentation

## 2020-09-29 DIAGNOSIS — Z79899 Other long term (current) drug therapy: Secondary | ICD-10-CM | POA: Insufficient documentation

## 2020-09-29 DIAGNOSIS — Z833 Family history of diabetes mellitus: Secondary | ICD-10-CM | POA: Diagnosis not present

## 2020-09-29 DIAGNOSIS — Z836 Family history of other diseases of the respiratory system: Secondary | ICD-10-CM | POA: Diagnosis not present

## 2020-09-29 DIAGNOSIS — R635 Abnormal weight gain: Secondary | ICD-10-CM | POA: Diagnosis not present

## 2020-09-29 DIAGNOSIS — R070 Pain in throat: Secondary | ICD-10-CM | POA: Diagnosis not present

## 2020-09-29 DIAGNOSIS — Z5111 Encounter for antineoplastic chemotherapy: Secondary | ICD-10-CM | POA: Diagnosis not present

## 2020-09-29 DIAGNOSIS — Z95828 Presence of other vascular implants and grafts: Secondary | ICD-10-CM

## 2020-09-29 DIAGNOSIS — I700 Atherosclerosis of aorta: Secondary | ICD-10-CM | POA: Diagnosis not present

## 2020-09-29 LAB — CMP (CANCER CENTER ONLY)
ALT: 46 U/L — ABNORMAL HIGH (ref 0–44)
AST: 29 U/L (ref 15–41)
Albumin: 3 g/dL — ABNORMAL LOW (ref 3.5–5.0)
Alkaline Phosphatase: 68 U/L (ref 38–126)
Anion gap: 8 (ref 5–15)
BUN: 32 mg/dL — ABNORMAL HIGH (ref 8–23)
CO2: 26 mmol/L (ref 22–32)
Calcium: 8.6 mg/dL — ABNORMAL LOW (ref 8.9–10.3)
Chloride: 106 mmol/L (ref 98–111)
Creatinine: 0.76 mg/dL (ref 0.61–1.24)
GFR, Estimated: >60 mL/min (ref >60)
Glucose, Bld: 175 mg/dL — ABNORMAL HIGH (ref 70–99)
Potassium: 4.6 mmol/L (ref 3.5–5.1)
Sodium: 140 mmol/L (ref 135–145)
Total Bilirubin: 0.3 mg/dL (ref 0.3–1.2)
Total Protein: 5.7 g/dL — ABNORMAL LOW (ref 6.5–8.1)

## 2020-09-29 LAB — CBC WITH DIFFERENTIAL/PLATELET
Abs Immature Granulocytes: 0.02 10*3/uL (ref 0.00–0.07)
Basophils Absolute: 0.1 10*3/uL (ref 0.0–0.1)
Basophils Relative: 1 %
Eosinophils Absolute: 1.7 10*3/uL — ABNORMAL HIGH (ref 0.0–0.5)
Eosinophils Relative: 24 %
HCT: 35.6 % — ABNORMAL LOW (ref 39.0–52.0)
Hemoglobin: 11.8 g/dL — ABNORMAL LOW (ref 13.0–17.0)
Immature Granulocytes: 0 %
Lymphocytes Relative: 5 %
Lymphs Abs: 0.4 10*3/uL — ABNORMAL LOW (ref 0.7–4.0)
MCH: 31.5 pg (ref 26.0–34.0)
MCHC: 33.1 g/dL (ref 30.0–36.0)
MCV: 94.9 fL (ref 80.0–100.0)
Monocytes Absolute: 0.7 10*3/uL (ref 0.1–1.0)
Monocytes Relative: 9 %
Neutro Abs: 4.4 10*3/uL (ref 1.7–7.7)
Neutrophils Relative %: 61 %
Platelets: 231 10*3/uL (ref 150–400)
RBC: 3.75 MIL/uL — ABNORMAL LOW (ref 4.22–5.81)
RDW: 14.7 % (ref 11.5–15.5)
WBC: 7.2 10*3/uL (ref 4.0–10.5)
nRBC: 0 % (ref 0.0–0.2)

## 2020-09-29 LAB — LACTATE DEHYDROGENASE: LDH: 155 U/L (ref 98–192)

## 2020-09-29 LAB — SAMPLE TO BLOOD BANK

## 2020-09-29 LAB — MAGNESIUM: Magnesium: 1.9 mg/dL (ref 1.7–2.4)

## 2020-09-29 MED ORDER — SODIUM CHLORIDE 0.9% FLUSH
10.0000 mL | Freq: Once | INTRAVENOUS | Status: AC
  Administered 2020-09-29: 10 mL
  Filled 2020-09-29: qty 10

## 2020-09-29 MED ORDER — HEPARIN SOD (PORK) LOCK FLUSH 100 UNIT/ML IV SOLN
500.0000 [IU] | Freq: Once | INTRAVENOUS | Status: AC
  Administered 2020-09-29: 500 [IU]
  Filled 2020-09-29: qty 5

## 2020-09-29 MED ORDER — MAGIC MOUTHWASH W/LIDOCAINE: ORAL | 2 refills | Status: DC

## 2020-09-29 NOTE — Patient Instructions (Signed)
Implanted Port Insertion, Care After This sheet gives you information about how to care for yourself after your procedure. Your health care provider may also give you more specific instructions. If you have problems or questions, contact your health care provider. What can I expect after the procedure? After the procedure, it is common to have:  Discomfort at the port insertion site.  Bruising on the skin over the port. This should improve over 3-4 days. Follow these instructions at home: Port care  After your port is placed, you will get a manufacturer's information card. The card has information about your port. Keep this card with you at all times.  Take care of the port as told by your health care provider. Ask your health care provider if you or a family member can get training for taking care of the port at home. A home health care nurse may also take care of the port.  Make sure to remember what type of port you have. Incision care  Follow instructions from your health care provider about how to take care of your port insertion site. Make sure you: ? Wash your hands with soap and water before and after you change your bandage (dressing). If soap and water are not available, use hand sanitizer. ? Change your dressing as told by your health care provider. ? Leave stitches (sutures), skin glue, or adhesive strips in place. These skin closures may need to stay in place for 2 weeks or longer. If adhesive strip edges start to loosen and curl up, you may trim the loose edges. Do not remove adhesive strips completely unless your health care provider tells you to do that.  Check your port insertion site every day for signs of infection. Check for: ? Redness, swelling, or pain. ? Fluid or blood. ? Warmth. ? Pus or a bad smell.      Activity  Return to your normal activities as told by your health care provider. Ask your health care provider what activities are safe for you.  Do not  lift anything that is heavier than 10 lb (4.5 kg), or the limit that you are told, until your health care provider says that it is safe. General instructions  Take over-the-counter and prescription medicines only as told by your health care provider.  Do not take baths, swim, or use a hot tub until your health care provider approves. Ask your health care provider if you may take showers. You may only be allowed to take sponge baths.  Do not drive for 24 hours if you were given a sedative during your procedure.  Wear a medical alert bracelet in case of an emergency. This will tell any health care providers that you have a port.  Keep all follow-up visits as told by your health care provider. This is important. Contact a health care provider if:  You cannot flush your port with saline as directed, or you cannot draw blood from the port.  You have a fever or chills.  You have redness, swelling, or pain around your port insertion site.  You have fluid or blood coming from your port insertion site.  Your port insertion site feels warm to the touch.  You have pus or a bad smell coming from the port insertion site. Get help right away if:  You have chest pain or shortness of breath.  You have bleeding from your port that you cannot control. Summary  Take care of the port as told by your   health care provider. Keep the manufacturer's information card with you at all times.  Change your dressing as told by your health care provider.  Contact a health care provider if you have a fever or chills or if you have redness, swelling, or pain around your port insertion site.  Keep all follow-up visits as told by your health care provider. This information is not intended to replace advice given to you by your health care provider. Make sure you discuss any questions you have with your health care provider. Document Revised: 12/18/2017 Document Reviewed: 12/18/2017 Elsevier Patient Education   2021 [employer] Inc.  

## 2020-09-30 ENCOUNTER — Telehealth: Payer: Self-pay | Admitting: Hematology

## 2020-09-30 NOTE — Telephone Encounter (Signed)
Scheduled follow-up appointment per 4/27 los. Patient is aware. 

## 2020-10-01 ENCOUNTER — Other Ambulatory Visit: Payer: Self-pay

## 2020-10-01 DIAGNOSIS — C01 Malignant neoplasm of base of tongue: Secondary | ICD-10-CM

## 2020-10-01 MED ORDER — AMOXICILLIN-POT CLAVULANATE 875-125 MG PO TABS: 1.0000 | ORAL_TABLET | Freq: Two times a day (BID) | ORAL | 0 refills | Status: AC

## 2020-10-01 NOTE — Telephone Encounter (Signed)
Returned call to pt regarding mouth pain. Pt states the magic mouthwash was not helping at all. Prescription sent in per Dr Irene Limbo for Augmentin. Pt acknowledged. Pt verbalized understanding to take 1 tablet twice daily for 7 days. Pt to call back Monday or Tuesday if no improvement noted.

## 2020-10-04 ENCOUNTER — Other Ambulatory Visit: Payer: Self-pay

## 2020-10-04 DIAGNOSIS — C01 Malignant neoplasm of base of tongue: Secondary | ICD-10-CM

## 2020-10-04 MED ORDER — OXYCODONE HCL 5 MG/5ML PO SOLN: 5.0000 mg | Freq: Four times a day (QID) | ORAL | 0 refills | Status: DC | PRN

## 2020-10-06 ENCOUNTER — Encounter: Payer: Self-pay | Admitting: Dietician

## 2020-10-06 NOTE — Progress Notes (Signed)
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 Attempted to contact patient via telephone for nutrition follow-up this morning. Patient did not answer. Voicemail left with request for return call. Contact information provided.   Lajuan Lines, RD, [facility] Cell [phone]

## 2020-10-07 ENCOUNTER — Ambulatory Visit: Payer: BC Managed Care – PPO | Attending: Radiation Oncology

## 2020-10-07 ENCOUNTER — Telehealth (HOSPITAL_COMMUNITY): Payer: Self-pay | Admitting: Dietician

## 2020-10-07 ENCOUNTER — Other Ambulatory Visit: Payer: Self-pay

## 2020-10-07 ENCOUNTER — Encounter (HOSPITAL_COMMUNITY): Payer: BC Managed Care – PPO | Admitting: Dietician

## 2020-10-07 DIAGNOSIS — R1310 Dysphagia, unspecified: Secondary | ICD-10-CM | POA: Diagnosis not present

## 2020-10-07 NOTE — Therapy (Signed)
[facility] [address] [place], [place], [address] Phone: [phone]   Fax:  [phone]  Speech Language Pathology Treatment/REnewal Summary  Patient Details  Name: Jose Harvey MRN: [id number] Date of Birth: [birth year] Referring Provider (SLP): Eppie Gibson, MD   Encounter Date: 10/07/2020   End of Session - 10/07/20 1649    Visit Number 4    Number of Visits 7    Date for SLP Re-Evaluation 01/05/21    SLP Start Time 1532    SLP Stop Time  1610    SLP Time Calculation (min) 38 min    Activity Tolerance Patient tolerated treatment well           Past Medical History:  Diagnosis Date  . Adrenal adenoma, left 03/2017   noted on CT  . Allergic rhinitis, cause unspecified   . Anxiety state, unspecified   . Aortic atherosclerosis ([place]) 03/2017   noted on CT  . BPV (benign positional vertigo)   . COPD (chronic obstructive pulmonary disease) with emphysema (HCC)    mild followed by [facility]   . Coronary artery disease   . Depressive disorder, not elsewhere classified   . Dyspnea    history of  . ED (erectile dysfunction)   . Gallbladder polyp 03/2016   noted on CT  . Hepatic hemangioma 03/2017   noted on CT  . History of kidney stones    hx of years ago   . Indigestion   . 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 Mixed hyperlipidemia   . Prostate cancer ([place])   . 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 Type I (juvenile type) diabetes mellitus without mention of complication, not stated as uncontrolled    type I - followed by Dr Buddy Duty   . Vesicoureteral reflux     Past Surgical History:  Procedure Laterality Date  . CHOLECYSTECTOMY N/A 05/17/2016   Procedure: LAPAROSCOPIC CHOLECYSTECTOMY;  Surgeon: Clovis Riley, MD;  Location: [facility];  Service: General;  Laterality: N/A;  . cyst removed from throat      . dental surgeries     . IR GASTROSTOMY TUBE MOD SED  07/07/2020  . IR IMAGING GUIDED PORT INSERTION  07/07/2020  . LYMPHADENECTOMY Bilateral 07/02/2017   Procedure: BILATERAL LYMPHADENECTOMY;  Surgeon: Lucas Mallow, MD;  Location: [facility];  Service: Urology;  Laterality: Bilateral;  . PROSTATE BIOPSY    . ROBOT ASSISTED LAPAROSCOPIC RADICAL PROSTATECTOMY N/A 07/02/2017   Procedure: XI ROBOTIC ASSISTED LAPAROSCOPIC RADICAL PROSTATECTOMY;  Surgeon: Lucas Mallow, MD;  Location: [facility];  Service: Urology;  Laterality: N/A;  . TONSILLECTOMY    . VIDEO ASSISTED THORACOSCOPY (VATS)/ LOBECTOMY Right 05/08/2019   Procedure: Right VIDEO ASSISTED THORACOSCOPY with   MIDDLE LOBECTOMY and Enbloc portion of Upper Lobe with Node dissection, Intercostal Nerve Block;  Surgeon: Melrose Nakayama, MD;  Location: [facility];  Service: Thoracic;  Laterality: Right;  . WISDOM TOOTH EXTRACTION      There were no vitals filed for this visit.   Subjective Assessment - 10/07/20 1533    Subjective "Throat got sore a few dyas before seeing Dr. Irene Limbo and been sore ever since."    Currently in Pain? Yes    Pain Score 5     Pain Location Throat    Pain Orientation Right    Pain Descriptors / Indicators Sore    Pain Type Acute  pain    Pain Onset 1 to 4 weeks ago                 ADULT SLP TREATMENT - 10/07/20 1550      General Information   Behavior/Cognition Alert;Cooperative;Pleasant mood      Treatment Provided   Treatment provided Dysphagia      Dysphagia Treatment   Temperature Spikes Noted No    Respiratory Status Room air    Treatment Methods Compensation strategy training;Therapeutic exercise;Skilled observation    Patient observed directly with PO's Yes    Type of PO's observed Thin liquids    Oral Phase Signs & Symptoms --   none noted/reported   Pharyngeal Phase Signs & Symptoms Other (comment)   c/o nasal regurgitation on 4th spoonfull of applesauce - began clearing throat at that  time   Other treatment/comments Pt reports main difficulty with eating solids right now is with rt lateral-posterior lingual pain - pt states he thinks due to still healing, but also reports pain radiates to rt ear at times. SLP attempted to visualize this spot on lateral tongue today but could not as it was more posterior than could be visualized with what SLP had available in clinic. PT began throat clearing after 4th swallow of applesauce, complaining of nasal regurgitation - pt has not had this with other pureed items when he was eating them 2-3 weeks ago. SLP and pt agreed at this time objective swallow eval is not necessary and he will monitor until next visit. Pt was cautioned if that feeling of nasal regurgitation *increases in frequency* to contact Dr. Isidore Moos and inform her of it, ASAP. Pt has completed HEP again with suboptimal frequency - SLP STRONGLY SUGGESTED to patient to complete HEP as directed and pt told SLP rationale after SLP reviewed rationale. SLP possibly palpated slight hardening rt lateral neck to near rt of thyroid. HEP completed today with modified independence.      Assessment / Recommendations / Plan   Plan Continue with current plan of care      Progression Toward Goals   Progression toward goals Not progressing toward goals (comment)   suboptimal completion of HEP continues           SLP Education - 10/07/20 1648    Education Details NEED to complete HEP BID, rationale for HEP    Person(s) Educated Patient    Methods Explanation    Comprehension Verbalized understanding            SLP Short Term Goals - 10/07/20 1652      SLP SHORT TERM GOAL #1   Title Pt will complete HEP with rare min A over two sessions    Baseline 09-09-20    Period --   visits, for all STGs   Status Achieved      SLP SHORT TERM GOAL #2   Title Pt will tell SLP rationale for HEP completion over two sessions    Baseline 09-09-20    Status Achieved      SLP SHORT TERM GOAL #3   Title  Pt will tell SLP how a food journal can hasten/facilitate return to more normalized diet    Status Deferred            SLP Long Term Goals - 10/07/20 1618      SLP LONG TERM GOAL #1   Title Pt will complete HEP with modified independence over 3 visits    Baseline 10-07-20  Time 1    Status On-going      SLP LONG TERM GOAL #2   Title Pt will tell SLP 3 overt s/s aspiration PNA with modified independence    Status Achieved      SLP LONG TERM GOAL #3   Title Pt will tell SLP when to decr frequency of HEP    Time 3    Status On-going      SLP LONG TERM GOAL #4   Title pt will indicate how a food journal can facilitate faster return to pre-rad diet    Time 2    Status New            Plan - 10/07/20 [place] presents today with at least [facility] swallowing ability with dys I and thin liquids.   No overt s/sx aspiration PNA reported or observed today, however he did have some nasal regurgitation with pureed after 4th spoonfull of applesauce which caused throat clearing. Pt denied other nasal regurgitation with pureeds 2-3 weeks ago. Data suggests that as pts progress through rad or chemorad therapy that their swallowing ability will decrease. 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 SLP reviewed [name]' indivdualized exercise program to mitigate/eliminate pt's muscle fibrosis following and as a result of, pt's rad tx. Pt continues with suboptimal completion of HEP yet completes the exercises with modified independence. Skilled ST would cont to be beneficial to the pt in order to regularly assess pt's safety with POs and/or need for instrumental swallow assessment, as well as to assess accurate completion of swallowing HEP. Pt may require objective swallow eval after next session if dysphagia and/or muscle fibrosis appears, and/or appears to have worsened    Speech Therapy Frequency --    once every approx 4 weeks   Duration --   7 total visits   Treatment/Interventions Aspiration precaution training;Pharyngeal strengthening exercises;Diet toleration management by SLP;Trials of upgraded texture/liquids;Internal/external aids;Patient/family education;Compensatory strategies;SLP instruction and feedback    Potential to Achieve Goals Good    SLP Home Exercise Plan provided today    Consulted and Agree with Plan of Care Patient           Patient will benefit from skilled therapeutic intervention in order to improve the following deficits and impairments:   Dysphagia, unspecified type - Plan: SLP plan of care cert/re-cert    Problem List Patient Active Problem List   Diagnosis Date Noted  . Port-A-Cath in place 07/13/2020  . Counseling regarding advance care planning and goals of care 06/29/2020  . 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 S/P thoracotomy 05/08/2019  . Prostate cancer ([place]) 07/02/2017  . Malignant neoplasm of prostate ([place]) 06/06/2017  . DOE (dyspnea on exertion) 07/28/2014  . COPD (chronic obstructive pulmonary disease) ([place]) 01/30/2014  . Throat fullness 01/30/2014  . Paroxysmal nocturnal dyspnea 11/17/2013  . Hyperlipidemia 11/17/2013  . Insulin dependent diabetes mellitus 11/17/2013  . Essential hypertension 11/17/2013    [facility] 10/07/2020, 4:54 PM  [facility] [address] [place], [place], [address] Phone: [phone]   Fax:  [phone]   Name: Jose Harvey MRN: [id number] Date of Birth: Oct 31, 1960

## 2020-10-07 NOTE — Telephone Encounter (Signed)
Nutrition Follow-up:  Patient has completed concurrent chemoradiation therapy for tongue cancer.   Received return call from patient today. He reports doing well and has been trying to eat more orally, but has "had a set back" in the last week due to sore throat. Patient reports taking an antibiotic for this. He continues to use Lidocaine rinse, Magic Mouthwash, and salt water/baking soda rinses. He tried a bowl of grits a couple of weeks ago, states "it went down good", had 4-5 bites of alfredo a couple of days ago. He has been drinking 4 bottles of water and 2 Fairlife Shakes. Patient continues to use tube daily, reports having enough supplies. He is giving 2 cartons Glucerna 1.5 TID, flushing with 60 ml water before and after feedings. Patient reports tolerating this well. Sometimes he gives Fairlife supplement via tube and will give additional 60 ml water flush. He is no longer using protein powder.    Medications: reviewed  Labs: reviewed  Anthropometrics: Weight 168 lb 11.2 oz on 4/27 increased 4 lbs in the last month  4/1 - 167 lb 8 oz 3/30 - 164 lb 11.2 oz   Estimated Energy Needs  Kcals: 2500-2900 Protein: 125-145 Fluid: 2.5 L  NUTRITION DIAGNOSIS: Inadequate energy intake stable   INTERVENTION:  Continue Glucerna 1.5 - 2 cartons TID with 60 ml water before and after each feeding. This provides 2136 kcal, 117.6 grams protein, 1440 ml total water Continue drinking 1440 ml water by mouth or via tube between feedings Continue drinking Fairlife Shake for added calories and protein (150 kcal, 30 grams protein) Encouraged oral intake of soft foods as tolerated Encouraged completing daily SLP exercises  Continue Lidocaine rinse and mouth rinse    MONITORING, EVALUATION, GOAL: weight trends, intake, tube feeds   NEXT VISIT: via telephone ~3-4 weeks

## 2020-10-08 NOTE — Progress Notes (Deleted)
  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  Cancer Staging: Cancer Staging Cancer of base of tongue (HCC) Staging form: Pharynx - HPV-Mediated Oropharynx, AJCC 8th Edition - Clinical stage from 06/11/2020: Stage I (cT2, cN1, cM0, p16+) - Signed by Nike Southwell, MD on 06/11/2020 Stage prefix: Initial diagnosis  Malignant neoplasm of prostate (HCC) Staging form: Prostate, AJCC 8th Edition - Clinical: Stage IIC (cT2a, cN0, cM0, PSA: 4.1, Grade Group: 3) - Signed by Manning, Matthew, MD on 06/06/2017 Prostate specific antigen (PSA) range: Less than 10 Gleason score: 7 Histologic grading system: 5 grade system    Intent: Curative  Radiation Treatment Dates: 07/05/2020 through 08/20/2020 Site Technique Total Dose (Gy) Dose per Fx (Gy) Completed Fx Beam Energies  Oropharynx: HN_BOT IMRT 70/70 2 35/35 6X   Narrative: The patient tolerated radiation therapy relatively well.   Plan: The patient will follow-up with radiation oncology in 2-3wks .  -----------------------------------  Raylee Strehl, MD   

## 2020-10-20 ENCOUNTER — Other Ambulatory Visit: Payer: Self-pay

## 2020-10-20 ENCOUNTER — Ambulatory Visit (INDEPENDENT_AMBULATORY_CARE_PROVIDER_SITE_OTHER): Payer: BC Managed Care – PPO | Admitting: Otolaryngology

## 2020-10-20 VITALS — Temp 97.5°F

## 2020-10-20 DIAGNOSIS — Z85810 Personal history of malignant neoplasm of tongue: Secondary | ICD-10-CM

## 2020-10-20 NOTE — Progress Notes (Signed)
HPI: Jose Harvey is a 60 y.o. male who returns today for evaluation of complaints of right ear pain for the past 6 weeks.  He is also noted a role throat and some bloody mucus discharge from his nose. 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 He apparently is scheduled to get a PET scan follow-up in June and follow-up with Dr. Isidore Moos in July.. Patient with history of type 1 diabetes  Past Medical History:  Diagnosis Date  . Adrenal adenoma, left 03/2017   noted on CT  . Allergic rhinitis, cause unspecified   . Anxiety state, unspecified   . Aortic atherosclerosis ([place]) 03/2017   noted on CT  . BPV (benign positional vertigo)   . COPD (chronic obstructive pulmonary disease) with emphysema ([facility])    mild followed by [facility]   . Coronary artery disease   . Depressive disorder, not elsewhere classified   . Dyspnea    history of  . ED (erectile dysfunction)   . Gallbladder polyp 03/2016   noted on CT  . Hepatic hemangioma 03/2017   noted on CT  . History of kidney stones    hx of years ago   . Indigestion   . 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 Mixed hyperlipidemia   . Prostate cancer ([place])   . 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 Type I (juvenile type) diabetes mellitus without mention of complication, not stated as uncontrolled    type I - followed by Dr Buddy Duty   . Vesicoureteral reflux    Past Surgical History:  Procedure Laterality Date  . CHOLECYSTECTOMY N/A 05/17/2016   Procedure: LAPAROSCOPIC CHOLECYSTECTOMY;  Surgeon: Clovis Riley, MD;  Location: [facility];  Service: General;  Laterality: N/A;  . cyst removed from throat     . dental surgeries     . IR GASTROSTOMY TUBE MOD SED  07/07/2020  . IR IMAGING GUIDED PORT INSERTION  07/07/2020  . LYMPHADENECTOMY Bilateral 07/02/2017   Procedure: BILATERAL  LYMPHADENECTOMY;  Surgeon: Lucas Mallow, MD;  Location: [facility];  Service: Urology;  Laterality: Bilateral;  . PROSTATE BIOPSY    . ROBOT ASSISTED LAPAROSCOPIC RADICAL PROSTATECTOMY N/A 07/02/2017   Procedure: XI ROBOTIC ASSISTED LAPAROSCOPIC RADICAL PROSTATECTOMY;  Surgeon: Lucas Mallow, MD;  Location: [facility];  Service: Urology;  Laterality: N/A;  . TONSILLECTOMY    . VIDEO ASSISTED THORACOSCOPY (VATS)/ LOBECTOMY Right 05/08/2019   Procedure: Right VIDEO ASSISTED THORACOSCOPY with   MIDDLE LOBECTOMY and Enbloc portion of Upper Lobe with Node dissection, Intercostal Nerve Block;  Surgeon: Melrose Nakayama, MD;  Location: [facility];  Service: Thoracic;  Laterality: Right;  . WISDOM TOOTH EXTRACTION     Social History   Socioeconomic History  . Marital status: Divorced    Spouse name: Not on file  . Number of children: 2  . Years of education: Not on file  . 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 Smoking status: Former Smoker    Packs/day: 1.50    Years: 27.00    Pack years: 40.50    Types: Cigarettes    Quit date: 06/05/2008    Years since quitting: 12.3  . Smokeless tobacco: Former Systems developer    Types: Snuff    Quit date: 2015  Vaping  Use  . Vaping Use: Never used  Substance and Sexual Activity  . Alcohol use: Yes    Alcohol/week: 6.0 standard drinks    Types: 6 Cans of beer per week    Comment: occasional 6pk per week  . Drug use: No  . Sexual activity: Not Currently  Other Topics Concern  . Not on file  Social History Narrative   Patient divorced. Patient has two grown daughters but neither lives locally.   Social Determinants of Health   Financial Resource Strain: Low Risk   . Difficulty of Paying Living Expenses: Not very hard  Food Insecurity: No Food Insecurity  . Worried About Charity fundraiser in the Last Year: Never true  . Ran Out of Food in the Last Year: Never true  Transportation  Needs: No Transportation Needs  . Lack of Transportation (Medical): No  . Lack of Transportation (Non-Medical): No  Physical Activity: Not on file  Stress: No Stress Concern Present  . Feeling of Stress : Only a little  Social Connections: Unknown  . Frequency of Communication with Friends and Family: More than three times a week  . Frequency of Social Gatherings with Friends and Family: More than three times a week  . Attends Religious Services: More than 4 times per year  . Active Member of Clubs or Organizations: Yes  . Attends Archivist Meetings: More than 4 times per year  . Marital Status: Not on file   Family History  Problem Relation Age of Onset  . Diabetes type I Father   . Heart attack Father   . Hypertension Father   . Emphysema Father   . CAD Father   . Colon cancer Maternal Grandfather   . Emphysema Paternal Grandfather   . Diabetes type I Paternal Grandfather   . Kidney disease Neg Hx   . Liver disease Neg Hx   . Breast cancer Neg Hx   . Prostate cancer Neg Hx    Allergies  Allergen Reactions  . Crestor [Rosuvastatin]     Muscle aches in higher dose  . Lipitor [Atorvastatin]     Muscle aches  . Other Other (See Comments)    Any antihistamines Causes shaking  . Zocor [Simvastatin] Other (See Comments)    Muscle aches    Prior to Admission medications   Medication Sig Start Date End Date Taking? Authorizing Provider  lidocaine (XYLOCAINE) 2 % solution Patient: Mix 1part 2% viscous lidocaine, 1part H20. Swish & swallow 26mL of diluted mixture, 62min before meals and at bedtime, up to QID 07/12/20   Eppie Gibson, MD  aspirin 81 MG tablet Take 1 tablet (81 mg total) by mouth daily. 07/07/17   Filippou, Braxton Feathers, MD  cetirizine (ZYRTEC) 10 MG tablet Take 10 mg by mouth daily.    [provider]  chlorhexidine (PERIDEX) 0.12 % solution Use as directed 15 mLs in the mouth or throat daily.    [provider]  Coenzyme Q10 (COQ10) 100 MG  CAPS Take 100 mg by mouth daily.    [provider]  Continuous Blood Gluc Sensor (FREESTYLE LIBRE 14 DAY SENSOR) MISC AS DIRECTED CHANGE SENSOR EVERY 14 DAYS 28 09/26/19   [provider]  dexamethasone (DECADRON) 4 MG tablet Take 2 tablets (8 mg total) by mouth daily. Start the day after chemotherapy for 2 days. 06/29/20   Brunetta Genera, MD  ibuprofen (ADVIL) 200 MG tablet Take 400-800 mg by mouth every 6 (six) hours as  needed for moderate pain. Patient not taking: Reported on 06/17/2020    [provider]  Insulin Human (INSULIN PUMP) SOLN Inject into the skin continuous. NOVOLOG 100 UNIT/ML VIAL    [provider]  lidocaine-prilocaine (EMLA) cream Apply to affected area once 06/29/20   Brunetta Genera, MD  lisinopril (PRINIVIL,ZESTRIL) 5 MG tablet Take 5 mg by mouth daily.  09/09/13   [provider]  LORazepam (ATIVAN) 0.5 MG tablet Take 1 tablet (0.5 mg total) by mouth every 6 (six) hours as needed (Nausea or vomiting). 06/29/20   Brunetta Genera, MD  magic mouthwash w/lidocaine SOLN 62ml oral swish and swallow 4 times daily as needed  1 Part viscous lidocaine 2% 1 Part Maalox 1 Part diphenhydramine 12.5 mg per 5 ml elixir 1 part Nystatin 500000 units per 52ml 09/29/20   Brunetta Genera, MD  Multiple Vitamins-Minerals (ICAPS AREDS 2) CAPS Take 1 capsule by mouth daily.    [provider]  NOVOLOG 100 UNIT/ML injection Averages about 30 units per day via insulin pump 08/09/19   [provider]  Nutritional Supplements (FEEDING SUPPLEMENT, GLUCERNA 1.5 CAL,) LIQD Give 2 cartons at 8am, noon and 4pm. Give 1 carton at 8pm. Flush with 42ml of water before and after.  Drink or give additional water via tube of 244ml 3 times per day.  Meets 100% of needs.  Send bolus supplies. 07/20/20   Eppie Gibson, MD  ondansetron (ZOFRAN) 8 MG tablet Take 1 tablet (8 mg total) by mouth 2 (two) times daily as needed for refractory nausea /  vomiting. Start on day 3 after chemo. Patient not taking: Reported on 09/01/2020 06/29/20   Brunetta Genera, MD  oxyCODONE (ROXICODONE) 5 MG/5ML solution Take 5 mLs (5 mg total) by mouth every 6 (six) hours as needed for severe pain. 10/04/20   Brunetta Genera, MD  pantoprazole (PROTONIX) 40 MG tablet Take 40 mg by mouth daily.    [provider]  prochlorperazine (COMPAZINE) 10 MG tablet Take 1 tablet (10 mg total) by mouth every 6 (six) hours as needed (Nausea or vomiting). 06/29/20   Brunetta Genera, MD  rosuvastatin (CRESTOR) 5 MG tablet Take 1 tablet (5 mg total) by mouth daily. 09/12/16   Dorothy Spark, MD  sertraline (ZOLOFT) 100 MG tablet Take 50 mg by mouth daily. 01/25/16   [provider]  sucralfate (CARAFATE) 1 g tablet Take 1 tablet (1 g total) by mouth 4 (four) times daily -  with meals and at bedtime. Patient not taking: Reported on 09/01/2020 07/27/20   Brunetta Genera, MD     Positive ROS: Otherwise negative  All other systems have been reviewed and were otherwise negative with the exception of those mentioned in the HPI and as above.  Physical Exam: Constitutional: Alert, well-appearing, no acute distress Ears: External ears without lesions or tenderness. Ear canals are clear bilaterally with no signs of infection.  Particularly the right ear canal and right TM are clear. Nasal: External nose without lesions. Septum relatively midline with mild rhinitis.. Clear nasal passages bilaterally.  On fiberoptic laryngoscopy the nasal cavities were clear bilaterally.  The nasopharynx was clear.  The base of tongue was slightly edematous with mucus but no base of tongue masses lesions or ulcerations noted.  Vocal cords were clear bilaterally.  Epiglottis was slightly erythematous but no mucosal lesions noted. Oral: Lips and gums without lesions. Tongue and palate mucosa without lesions. Posterior oropharynx clear.  Palpation of  the base of tongue region  was soft with no discrete palpable masses.  No obvious palpable adenopathy in the neck. Neck: No palpable adenopathy or masses Respiratory: Breathing comfortably  Skin: No facial/neck lesions or rash noted.  Laryngoscopy  Date/Time: 10/20/2020 1:32 PM Performed by: Rozetta Nunnery, MD Authorized by: Rozetta Nunnery, MD   Consent:    Consent obtained:  Verbal   Consent given by:  Patient Procedure details:    Indications: oncologic surveillance follow-up     Medication:  Afrin   Instrument: flexible fiberoptic laryngoscope     Scope location: bilateral nare   Sinus:    Right nasopharynx: normal     Left nasopharynx: normal   Mouth:    Oropharynx: normal     Vallecula: normal     Base of tongue: normal     Epiglottis: normal     inflammation   Throat:    True vocal cords: normal   Comments:     On fiberoptic laryngoscopy the hypopharynx and larynx was clear with no obvious persistent cancer noted.  Epiglottis was erythematous but no mucosal lesions noted.    Assessment: Patient seems to respond well to IMRT treatment of his right base of tongue and right neck disease. I suspect some of the ear discomfort he is having is related to treatment of the cancer.  There is no clinical evidence of active ear infection or ear canal infection. Clinically no evidence of persistent or recurrent cancer.  Plan: Patient apparently is scheduled to get a repeat PET scan in June.  He has follow-up scheduled with radiation oncology in July.  We will plan on having him follow-up here 2 to 3 months later.   Radene Journey, MD

## 2020-10-25 DIAGNOSIS — Z794 Long term (current) use of insulin: Secondary | ICD-10-CM | POA: Diagnosis not present

## 2020-10-25 DIAGNOSIS — C139 Malignant neoplasm of hypopharynx, unspecified: Secondary | ICD-10-CM | POA: Diagnosis not present

## 2020-10-25 DIAGNOSIS — E103293 Type 1 diabetes mellitus with mild nonproliferative diabetic retinopathy without macular edema, bilateral: Secondary | ICD-10-CM | POA: Diagnosis not present

## 2020-10-25 DIAGNOSIS — E1065 Type 1 diabetes mellitus with hyperglycemia: Secondary | ICD-10-CM | POA: Diagnosis not present

## 2020-10-25 DIAGNOSIS — K529 Noninfective gastroenteritis and colitis, unspecified: Secondary | ICD-10-CM | POA: Diagnosis not present

## 2020-10-25 DIAGNOSIS — C8599 Non-Hodgkin lymphoma, unspecified, extranodal and solid organ sites: Secondary | ICD-10-CM | POA: Diagnosis not present

## 2020-10-25 DIAGNOSIS — Z9641 Presence of insulin pump (external) (internal): Secondary | ICD-10-CM | POA: Diagnosis not present

## 2020-10-25 DIAGNOSIS — R634 Abnormal weight loss: Secondary | ICD-10-CM | POA: Diagnosis not present

## 2020-10-26 ENCOUNTER — Telehealth: Payer: Self-pay | Admitting: Hematology

## 2020-10-26 ENCOUNTER — Other Ambulatory Visit: Payer: Self-pay

## 2020-10-26 ENCOUNTER — Inpatient Hospital Stay: Payer: BC Managed Care – PPO

## 2020-10-26 ENCOUNTER — Inpatient Hospital Stay (HOSPITAL_BASED_OUTPATIENT_CLINIC_OR_DEPARTMENT_OTHER): Payer: BC Managed Care – PPO | Admitting: Physician Assistant

## 2020-10-26 ENCOUNTER — Inpatient Hospital Stay: Payer: BC Managed Care – PPO | Attending: Hematology

## 2020-10-26 ENCOUNTER — Other Ambulatory Visit: Payer: Self-pay | Admitting: *Deleted

## 2020-10-26 VITALS — BP 103/62 | HR 89 | Temp 96.6°F | Resp 20 | Ht 71.0 in | Wt 165.1 lb

## 2020-10-26 DIAGNOSIS — R55 Syncope and collapse: Secondary | ICD-10-CM | POA: Insufficient documentation

## 2020-10-26 DIAGNOSIS — Z87891 Personal history of nicotine dependence: Secondary | ICD-10-CM | POA: Insufficient documentation

## 2020-10-26 DIAGNOSIS — C884 Extranodal marginal zone B-cell lymphoma of mucosa-associated lymphoid tissue [MALT-lymphoma]: Secondary | ICD-10-CM | POA: Diagnosis not present

## 2020-10-26 DIAGNOSIS — I9589 Other hypotension: Secondary | ICD-10-CM | POA: Diagnosis not present

## 2020-10-26 DIAGNOSIS — Z9079 Acquired absence of other genital organ(s): Secondary | ICD-10-CM | POA: Insufficient documentation

## 2020-10-26 DIAGNOSIS — Z9049 Acquired absence of other specified parts of digestive tract: Secondary | ICD-10-CM | POA: Insufficient documentation

## 2020-10-26 DIAGNOSIS — Z836 Family history of other diseases of the respiratory system: Secondary | ICD-10-CM | POA: Diagnosis not present

## 2020-10-26 DIAGNOSIS — I959 Hypotension, unspecified: Secondary | ICD-10-CM | POA: Diagnosis not present

## 2020-10-26 DIAGNOSIS — K529 Noninfective gastroenteritis and colitis, unspecified: Secondary | ICD-10-CM | POA: Diagnosis not present

## 2020-10-26 DIAGNOSIS — Z8546 Personal history of malignant neoplasm of prostate: Secondary | ICD-10-CM | POA: Insufficient documentation

## 2020-10-26 DIAGNOSIS — Z833 Family history of diabetes mellitus: Secondary | ICD-10-CM | POA: Diagnosis not present

## 2020-10-26 DIAGNOSIS — R0602 Shortness of breath: Secondary | ICD-10-CM | POA: Insufficient documentation

## 2020-10-26 DIAGNOSIS — R197 Diarrhea, unspecified: Secondary | ICD-10-CM | POA: Diagnosis not present

## 2020-10-26 DIAGNOSIS — C01 Malignant neoplasm of base of tongue: Secondary | ICD-10-CM

## 2020-10-26 DIAGNOSIS — E860 Dehydration: Secondary | ICD-10-CM | POA: Insufficient documentation

## 2020-10-26 DIAGNOSIS — C770 Secondary and unspecified malignant neoplasm of lymph nodes of head, face and neck: Secondary | ICD-10-CM | POA: Diagnosis not present

## 2020-10-26 DIAGNOSIS — Z7289 Other problems related to lifestyle: Secondary | ICD-10-CM | POA: Diagnosis not present

## 2020-10-26 DIAGNOSIS — Z923 Personal history of irradiation: Secondary | ICD-10-CM | POA: Diagnosis not present

## 2020-10-26 DIAGNOSIS — Z800 Family history of malignant neoplasm of digestive organs: Secondary | ICD-10-CM | POA: Diagnosis not present

## 2020-10-26 DIAGNOSIS — Z8249 Family history of ischemic heart disease and other diseases of the circulatory system: Secondary | ICD-10-CM | POA: Diagnosis not present

## 2020-10-26 DIAGNOSIS — Z79899 Other long term (current) drug therapy: Secondary | ICD-10-CM | POA: Diagnosis not present

## 2020-10-26 DIAGNOSIS — Z95828 Presence of other vascular implants and grafts: Secondary | ICD-10-CM

## 2020-10-26 LAB — CMP (CANCER CENTER ONLY)
ALT: 61 U/L — ABNORMAL HIGH (ref 0–44)
AST: 27 U/L (ref 15–41)
Albumin: 3.1 g/dL — ABNORMAL LOW (ref 3.5–5.0)
Alkaline Phosphatase: 81 U/L (ref 38–126)
Anion gap: 6 (ref 5–15)
BUN: 35 mg/dL — ABNORMAL HIGH (ref 8–23)
CO2: 27 mmol/L (ref 22–32)
Calcium: 9 mg/dL (ref 8.9–10.3)
Chloride: 104 mmol/L (ref 98–111)
Creatinine: 0.85 mg/dL (ref 0.61–1.24)
GFR, Estimated: >60 mL/min (ref >60)
Glucose, Bld: 214 mg/dL — ABNORMAL HIGH (ref 70–99)
Potassium: 4.6 mmol/L (ref 3.5–5.1)
Sodium: 137 mmol/L (ref 135–145)
Total Bilirubin: 0.4 mg/dL (ref 0.3–1.2)
Total Protein: 6.4 g/dL — ABNORMAL LOW (ref 6.5–8.1)

## 2020-10-26 LAB — CBC WITH DIFFERENTIAL (CANCER CENTER ONLY)
Abs Immature Granulocytes: 0.01 10*3/uL (ref 0.00–0.07)
Basophils Absolute: 0.1 10*3/uL (ref 0.0–0.1)
Basophils Relative: 1 %
Eosinophils Absolute: 0.6 10*3/uL — ABNORMAL HIGH (ref 0.0–0.5)
Eosinophils Relative: 10 %
HCT: 40.4 % (ref 39.0–52.0)
Hemoglobin: 13.6 g/dL (ref 13.0–17.0)
Immature Granulocytes: 0 %
Lymphocytes Relative: 7 %
Lymphs Abs: 0.4 10*3/uL — ABNORMAL LOW (ref 0.7–4.0)
MCH: 31.6 pg (ref 26.0–34.0)
MCHC: 33.7 g/dL (ref 30.0–36.0)
MCV: 94 fL (ref 80.0–100.0)
Monocytes Absolute: 0.5 10*3/uL (ref 0.1–1.0)
Monocytes Relative: 9 %
Neutro Abs: 4.5 10*3/uL (ref 1.7–7.7)
Neutrophils Relative %: 73 %
Platelet Count: 242 10*3/uL (ref 150–400)
RBC: 4.3 MIL/uL (ref 4.22–5.81)
RDW: 12.8 % (ref 11.5–15.5)
WBC Count: 6.1 10*3/uL (ref 4.0–10.5)
nRBC: 0 % (ref 0.0–0.2)

## 2020-10-26 LAB — LACTATE DEHYDROGENASE: LDH: 142 U/L (ref 98–192)

## 2020-10-26 LAB — MAGNESIUM: Magnesium: 2 mg/dL (ref 1.7–2.4)

## 2020-10-26 LAB — SAMPLE TO BLOOD BANK

## 2020-10-26 MED ORDER — HEPARIN SOD (PORK) LOCK FLUSH 100 UNIT/ML IV SOLN
500.0000 [IU] | Freq: Once | INTRAVENOUS | Status: AC | PRN
  Administered 2020-10-26: 500 [IU]
  Filled 2020-10-26: qty 5

## 2020-10-26 MED ORDER — SODIUM CHLORIDE 0.9 % IV SOLN
Freq: Once | INTRAVENOUS | Status: AC
  Filled 2020-10-26: qty 250

## 2020-10-26 MED ORDER — SODIUM CHLORIDE 0.9% FLUSH
10.0000 mL | Freq: Once | INTRAVENOUS | Status: AC | PRN
  Administered 2020-10-26: 10 mL
  Filled 2020-10-26: qty 10

## 2020-10-26 NOTE — Telephone Encounter (Signed)
Scheduled appts per 5/23 sch msg. Pt aware.

## 2020-10-26 NOTE — Patient Instructions (Signed)
Rehydration, Adult Rehydration is the replacement of body fluids, salts, and minerals (electrolytes) that are lost during dehydration. Dehydration is when there is not enough water or other fluids in the body. This happens when you lose more fluids than you take in. Common causes of dehydration include:  Not drinking enough fluids. This can occur when you are ill or doing activities that require a lot of energy, especially in hot weather.  Conditions that cause loss of water or other fluids, such as diarrhea, vomiting, sweating, or urinating a lot.  Other illnesses, such as fever or infection.  Certain medicines, such as those that remove excess fluid from the body (diuretics). Symptoms of mild or moderate dehydration may include thirst, dry lips and mouth, and dizziness. Symptoms of severe dehydration may include increased heart rate, confusion, fainting, and not urinating. For severe dehydration, you may need to get fluids through an IV at the hospital. For mild or moderate dehydration, you can usually rehydrate at home by drinking certain fluids as told by your health care provider. What are the risks? Generally, rehydration is safe. However, taking in too much fluid (overhydration) can be a problem. This is rare. Overhydration can cause an electrolyte imbalance, kidney failure, or a decrease in salt (sodium) levels in the body. Supplies needed You will need an oral rehydration solution (ORS) if your health care provider tells you to use one. This is a drink to treat dehydration. It can be found in pharmacies and retail stores. How to rehydrate Fluids Follow instructions from your health care provider for rehydration. The kind of fluid and the amount you should drink depend on your condition. In general, you should choose drinks that you prefer.  If told by your health care provider, drink an ORS. ? Make an ORS by following instructions on the package. ? Start by drinking small amounts,  about  cup (120 mL) every 5-10 minutes. ? Slowly increase how much you drink until you have taken the amount recommended by your health care provider.  Drink enough clear fluids to keep your urine pale yellow. If you were told to drink an ORS, finish it first, then start slowly drinking other clear fluids. Drink fluids such as: ? Water. This includes sparkling water and flavored water. Drinking only water can lead to having too little sodium in your body (hyponatremia). Follow the advice of your health care provider. ? Water from ice chips you suck on. ? Fruit juice with water you add to it (diluted). ? Sports drinks. ? Hot or cold herbal teas. ? Broth-based soups. ? Milk or milk products. Food Follow instructions from your health care provider about what to eat while you rehydrate. Your health care provider may recommend that you slowly begin eating regular foods in small amounts.  Eat foods that contain a healthy balance of electrolytes, such as bananas, oranges, potatoes, tomatoes, and spinach.  Avoid foods that are greasy or contain a lot of sugar. In some cases, you may get nutrition through a feeding tube that is passed through your nose and into your stomach (nasogastric tube, or NG tube). This may be done if you have uncontrolled vomiting or diarrhea.   Beverages to avoid Certain beverages may make dehydration worse. While you rehydrate, avoid drinking alcohol.   How to tell if you are recovering from dehydration You may be recovering from dehydration if:  You are urinating more often than before you started rehydrating.  Your urine is pale yellow.  Your energy level   improves.  You vomit less frequently.  You have diarrhea less frequently.  Your appetite improves or returns to normal.  You feel less dizzy or less light-headed.  Your skin tone and color start to look more normal. Follow these instructions at home:  Take over-the-counter and prescription medicines only  as told by your health care provider.  Do not take sodium tablets. Doing this can lead to having too much sodium in your body (hypernatremia). Contact a health care provider if:  You continue to have symptoms of mild or moderate dehydration, such as: ? Thirst. ? Dry lips. ? Slightly dry mouth. ? Dizziness. ? Dark urine or less urine than normal. ? Muscle cramps.  You continue to vomit or have diarrhea. Get help right away if you:  Have symptoms of dehydration that get worse.  Have a fever.  Have a severe headache.  Have been vomiting and the following happens: ? Your vomiting gets worse or does not go away. ? Your vomit includes blood or green matter (bile). ? You cannot eat or drink without vomiting.  Have problems with urination or bowel movements, such as: ? Diarrhea that gets worse or does not go away. ? Blood in your stool (feces). This may cause stool to look black and tarry. ? Not urinating, or urinating only a small amount of very dark urine, within 6-8 hours.  Have trouble breathing.  Have symptoms that get worse with treatment. These symptoms may represent a serious problem that is an emergency. Do not wait to see if the symptoms will go away. Get medical help right away. Call your local emergency services (911 in the [place] Do not drive yourself to the hospital. Summary  Rehydration is the replacement of body fluids and minerals (electrolytes) that are lost during dehydration.  Follow instructions from your health care provider for rehydration. The kind of fluid and amount you should drink depend on your condition.  Slowly increase how much you drink until you have taken the amount recommended by your health care provider.  Contact your health care provider if you continue to show signs of mild or moderate dehydration. This information is not intended to replace advice given to you by your health care provider. Make sure you discuss any questions you have with  your health care provider. Document Revised: 07/23/2019 Document Reviewed: 06/02/2019 [employer] Patient Education  2021 [employer]  

## 2020-10-27 ENCOUNTER — Encounter: Payer: Self-pay | Admitting: Hematology

## 2020-10-27 DIAGNOSIS — R197 Diarrhea, unspecified: Secondary | ICD-10-CM | POA: Insufficient documentation

## 2020-10-27 DIAGNOSIS — E860 Dehydration: Secondary | ICD-10-CM | POA: Insufficient documentation

## 2020-10-27 DIAGNOSIS — I959 Hypotension, unspecified: Secondary | ICD-10-CM | POA: Insufficient documentation

## 2020-10-27 NOTE — Progress Notes (Signed)
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  -07/05/2020-08/20/2020: Received IMRT. 70 Gy in 35 Fx.   #Malignant neoplasm of prostate, Clinical stage IIC (cT2aN0M0)   HISTORY OF PRESENTING ILLNESS:  Jose Harvey 60 y.o. male with medical history significant for MALT lymphoma, prostate cancer and SCC of the base s/p definitive radiation. Patient is unaccompanied for this visit.   On exam today, Jose Harvey reports worsening fatigue for the last week. He continues to complete his ADLs but is sleeping/resting more often than usual. He continues to use the feeding tube for nutrition although he notes eating a piece of cornbread yesterday with milk. He denies any regurgitation. Patient drinks about three 16 oz bottles of water per day. He denies any nausea, vomiting or abdominal pain. For the last several days, patient had diarrhea with up to 3-4 episodes per day. He is not taking any antidiarrheals at this time. Patient denies any signs of bleeding including hematochezia, melena. Patient reports having a syncopal episode last week but uncertain if he hit his head. Patient has chronic shortness of breath with exertion that has worsened minimally  in the last 2-3 weeks. He reports improvement of pain at the site of radiation. He continues to use lidocaine rinse and baking soda/salt water rinse as needed. He denies any fevers, chills, night sweats, chest pain or cough. He has no other complaints. Rest of the 10 point ROS is below.   MEDICAL HISTORY:  Past Medical History:  Diagnosis Date  . Adrenal adenoma, left 03/2017   noted on CT  . Allergic rhinitis, cause unspecified   . Anxiety state, unspecified   . Aortic atherosclerosis ([place]) 03/2017   noted on CT  . BPV (benign positional vertigo)   . COPD (chronic obstructive pulmonary disease) with emphysema (HCC)    mild followed by [facility]   . Coronary artery disease   . Depressive disorder, not elsewhere classified   . Dyspnea    history of  . ED (erectile dysfunction)   . Gallbladder polyp 03/2016   noted on CT  . Hepatic hemangioma 03/2017   noted on CT  . History of kidney stones    hx of years ago   . Indigestion   . 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 Mixed hyperlipidemia   . Prostate cancer ([place])   . 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 Type I (juvenile type) diabetes mellitus without mention of complication, not stated as uncontrolled    type I - followed by Dr Buddy Duty   . Vesicoureteral reflux     SURGICAL HISTORY: Past Surgical History:  Procedure Laterality Date  . CHOLECYSTECTOMY N/A 05/17/2016   Procedure: LAPAROSCOPIC CHOLECYSTECTOMY;  Surgeon: Clovis Riley, MD;  Location: [facility];  Service: General;  Laterality: N/A;  . cyst removed from throat     .  dental surgeries     . IR GASTROSTOMY TUBE MOD SED  07/07/2020  . IR IMAGING GUIDED PORT INSERTION  07/07/2020  . LYMPHADENECTOMY Bilateral 07/02/2017   Procedure: BILATERAL LYMPHADENECTOMY;  Surgeon: Lucas Mallow, MD;  Location: [facility];  Service: Urology;  Laterality: Bilateral;  . PROSTATE BIOPSY     . ROBOT ASSISTED LAPAROSCOPIC RADICAL PROSTATECTOMY N/A 07/02/2017   Procedure: XI ROBOTIC ASSISTED LAPAROSCOPIC RADICAL PROSTATECTOMY;  Surgeon: Lucas Mallow, MD;  Location: [facility];  Service: Urology;  Laterality: N/A;  . TONSILLECTOMY    . VIDEO ASSISTED THORACOSCOPY (VATS)/ LOBECTOMY Right 05/08/2019   Procedure: Right VIDEO ASSISTED THORACOSCOPY with   MIDDLE LOBECTOMY and Enbloc portion of Upper Lobe with Node dissection, Intercostal Nerve Block;  Surgeon: Melrose Nakayama, MD;  Location: [facility];  Service: Thoracic;  Laterality: Right;  . WISDOM TOOTH EXTRACTION      SOCIAL HISTORY: Social History   Socioeconomic History  . Marital status: Divorced    Spouse name: Not on file  . Number of children: 2  . Years of education: Not on file  . 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 Smoking status: Former Smoker    Packs/day: 1.50    Years: 27.00    Pack years: 40.50    Types: Cigarettes    Quit date: 06/05/2008    Years since quitting: 12.4  . Smokeless tobacco: Former Systems developer    Types: Snuff    Quit date: 2015  Vaping Use  . Vaping Use: Never used  Substance and Sexual Activity  . Alcohol use: Yes    Alcohol/week: 6.0 standard drinks    Types: 6 Cans of beer per week    Comment: occasional 6pk per week  . Drug use: No  . Sexual activity: Not Currently  Other Topics Concern  . Not on file  Social History Narrative   Patient divorced. Patient has two grown daughters but neither lives locally.   Social Determinants of Health   Financial Resource Strain: Low Risk   . Difficulty of Paying Living Expenses: Not very hard  Food Insecurity: No Food Insecurity  . Worried About Charity fundraiser in the Last Year: Never true  . Ran Out of Food in the Last Year: Never true  Transportation Needs: No Transportation Needs  . Lack of Transportation (Medical): No  . Lack of Transportation (Non-Medical): No   Physical Activity: Not on file  Stress: No Stress Concern Present  . Feeling of Stress : Only a little  Social Connections: Unknown  . Frequency of Communication with Friends and Family: More than three times a week  . Frequency of Social Gatherings with Friends and Family: More than three times a week  . Attends Religious Services: More than 4 times per year  . Active Member of Clubs or Organizations: Yes  . Attends Archivist Meetings: More than 4 times per year  . Marital Status: Not on file  Intimate Partner Violence: Not on file    FAMILY HISTORY: Family History  Problem Relation Age of Onset  . Diabetes type I Father   . Heart attack Father   . Hypertension Father   . Emphysema Father   . CAD Father   . Colon cancer Maternal Grandfather   . Emphysema Paternal Grandfather   . Diabetes type I Paternal Grandfather   . Kidney disease Neg Hx   .  Liver disease Neg Hx   . Breast cancer Neg Hx   . Prostate cancer Neg Hx     ALLERGIES:  is allergic to crestor [rosuvastatin], lipitor [atorvastatin], other, and zocor [simvastatin].  MEDICATIONS:  Current Outpatient Medications  Medication Sig Dispense Refill  . lidocaine (XYLOCAINE) 2 % solution Patient: Mix 1part 2% viscous lidocaine, 1part H20. Swish & swallow 68mL of diluted mixture, 40min before meals and at bedtime, up to QID 200 mL 4  . aspirin 81 MG tablet Take 1 tablet (81 mg total) by mouth daily. 30 tablet   . cetirizine (ZYRTEC) 10 MG tablet Take 10 mg by mouth daily.    . chlorhexidine (PERIDEX) 0.12 % solution Use as directed 15 mLs in the mouth or throat daily.    . Coenzyme Q10 (COQ10) 100 MG CAPS Take 100 mg by mouth daily.    . Continuous Blood Gluc Sensor (FREESTYLE LIBRE 14 DAY SENSOR) MISC AS DIRECTED CHANGE SENSOR EVERY 14 DAYS 28    . dexamethasone (DECADRON) 4 MG tablet Take 2 tablets (8 mg total) by mouth daily. Start the day after chemotherapy for 2 days. 30 tablet 1  . ibuprofen (ADVIL) 200  MG tablet Take 400-800 mg by mouth every 6 (six) hours as needed for moderate pain. (Patient not taking: Reported on 06/17/2020)    . Insulin Human (INSULIN PUMP) SOLN Inject into the skin continuous. NOVOLOG 100 UNIT/ML VIAL    . lidocaine-prilocaine (EMLA) cream Apply to affected area once 30 g 3  . lisinopril (PRINIVIL,ZESTRIL) 5 MG tablet Take 5 mg by mouth daily.     Marland Kitchen LORazepam (ATIVAN) 0.5 MG tablet Take 1 tablet (0.5 mg total) by mouth every 6 (six) hours as needed (Nausea or vomiting). 30 tablet 0  . magic mouthwash w/lidocaine SOLN 64ml oral swish and swallow 4 times daily as needed  1 Part viscous lidocaine 2% 1 Part Maalox 1 Part diphenhydramine 12.5 mg per 5 ml elixir 1 part Nystatin 500000 units per 61ml 200 mL 2  . Multiple Vitamins-Minerals (ICAPS AREDS 2) CAPS Take 1 capsule by mouth daily.    Marland Kitchen NOVOLOG 100 UNIT/ML injection Averages about 30 units per day via insulin pump    . Nutritional Supplements (FEEDING SUPPLEMENT, GLUCERNA 1.5 CAL,) LIQD Give 2 cartons at 8am, noon and 4pm. Give 1 carton at 8pm. Flush with 71ml of water before and after.  Drink or give additional water via tube of 296ml 3 times per day.  Meets 100% of needs.  Send bolus supplies. 1659 mL 5  . ondansetron (ZOFRAN) 8 MG tablet Take 1 tablet (8 mg total) by mouth 2 (two) times daily as needed for refractory nausea / vomiting. Start on day 3 after chemo. (Patient not taking: Reported on 09/01/2020) 30 tablet 1  . oxyCODONE (ROXICODONE) 5 MG/5ML solution Take 5 mLs (5 mg total) by mouth every 6 (six) hours as needed for severe pain. 200 mL 0  . pantoprazole (PROTONIX) 40 MG tablet Take 40 mg by mouth daily.    . prochlorperazine (COMPAZINE) 10 MG tablet Take 1 tablet (10 mg total) by mouth every 6 (six) hours as needed (Nausea or vomiting). 30 tablet 1  . rosuvastatin (CRESTOR) 5 MG tablet Take 1 tablet (5 mg total) by mouth daily. 90 tablet 3  . sertraline (ZOLOFT) 100 MG tablet Take 50 mg by mouth daily.  0  .  sucralfate (CARAFATE) 1 g tablet Take 1 tablet (1 g total) by mouth 4 (four) times daily -  with meals and at bedtime. (Patient not taking: Reported on 09/01/2020) 90 tablet 1   No current facility-administered medications for this visit.    REVIEW OF SYSTEMS:   Constitutional: ( - ) fevers, ( - )  chills , ( - ) night sweats Eyes: ( - ) blurriness of vision, ( - ) double vision, ( - ) watery eyes Ears, nose, mouth, throat, and face: ( - ) mucositis, ( - ) sore throat Respiratory: ( - ) cough, ( - ) dyspnea, ( - ) wheezes Cardiovascular: ( - ) palpitation, ( - ) chest discomfort, ( - ) lower extremity swelling Gastrointestinal:  ( - ) nausea, ( - ) heartburn, ( + ) change in bowel habits Skin: ( - ) abnormal skin rashes Lymphatics: ( - ) new lymphadenopathy, ( - ) easy bruising Neurological: ( - ) numbness, ( - ) tingling, ( - ) new weaknesses Behavioral/Psych: ( - ) mood change, ( - ) new changes  All other systems were reviewed with the patient and are negative.  PHYSICAL EXAMINATION: ECOG PERFORMANCE STATUS: 1 - Symptomatic but completely ambulatory  Vitals:   10/26/20 1343  BP: 103/62  Pulse: 89  Resp: 20  Temp: (!) 96.6 F (35.9 C)  SpO2: 100%   Filed Weights   10/26/20 1343  Weight: 165 lb 1.6 oz (74.9 kg)    GENERAL: well appearing male in NAD  SKIN: skin color, texture, turgor are normal, no rashes or significant lesions EYES: conjunctiva are pink and non-injected, sclera clear OROPHARYNX: no exudate, no erythema; lips, buccal mucosa, and tongue normal  LUNGS: clear to auscultation and percussion with normal breathing effort HEART: regular rate & rhythm and no murmurs and no lower extremity edema ABDOMEN: soft, non-tender, non-distended, normal bowel sounds Musculoskeletal: no cyanosis of digits and no clubbing  PSYCH: alert & oriented x 3, fluent speech NEURO: no focal motor/sensory deficits  LABORATORY DATA:  I have reviewed the data as listed CBC Latest Ref  Rng & Units 10/26/2020 09/29/2020 09/01/2020  WBC 4.0 - 10.5 K/uL 6.1 7.2 6.5  Hemoglobin 13.0 - 17.0 g/dL 13.6 11.8(L) 12.2(L)  Hematocrit 39.0 - 52.0 % 40.4 35.6(L) 36.7(L)  Platelets 150 - 400 K/uL 242 231 371    CMP Latest Ref Rng & Units 10/26/2020 09/29/2020 09/01/2020  Glucose 70 - 99 mg/dL 214(H) 175(H) 426(H)  BUN 8 - 23 mg/dL 35(H) 32(H) 49(H)  Creatinine 0.61 - 1.24 mg/dL 0.85 0.76 0.93  Sodium 135 - 145 mmol/L 137 140 136  Potassium 3.5 - 5.1 mmol/L 4.6 4.6 4.8  Chloride 98 - 111 mmol/L 104 106 99  CO2 22 - 32 mmol/L 27 26 26   Calcium 8.9 - 10.3 mg/dL 9.0 8.6(L) 8.4(L)  Total Protein 6.5 - 8.1 g/dL 6.4(L) 5.7(L) 6.3(L)  Total Bilirubin 0.3 - 1.2 mg/dL 0.4 0.3 <0.2(L)  Alkaline Phos 38 - 126 U/L 81 68 96  AST 15 - 41 U/L 27 29 35  ALT 0 - 44 U/L 61(H) 46(H) 53(H)   ASSESSMENT & PLAN Jose Harvey is a 60 y.o. male presenting to the clinic for a follow up after completion of definitive radiation for SCC of the tongue. Patient notes to have progressive fatigue, diarrhea and one syncopal episode in the last week. Likely due to dehydration due to minimal PO intake of fluids with diarrhea. Labs from today were reviewed without any intervention needed. Patient will proceed with 1 L of IV NS fluids today.   #SCC of the  tongue: --07/05/2020-08/20/2020: Received IMRT. 70 Gy in 35 Fx.  --Under surveillance --Scheduled for a follow up with Dr. Isidore Moos on 12/10/2020  #MALT lymphoma: --Underwent thoracoscopy with middle lobectomy on 05/08/2019.  --Under surveillance --Scheduled for a follow up with Dr. Yanis Larin Limbo on 12/10/2020  #Dehydration: --Currently patient drinks about 1.5 L of water per day.  --Encouraged to increase intake of fluids to at least 2-3 L per day --Received 1 L of IV fluids today  #Hypotension: --Prescribed lisinopril 5 mg daily but admits that he is not taking it for the last few days. --Today's BP was 103/62 which is lower than his baseline.  --advised patient to check  his BP daily and follow up with his PCP whether to restart lisinopril.  #Diarrhea: --Reports 3-4 episodes per day --Advised patient to take OTC imodium PRN --Patient is going to drop off stool sample to PCP today for stool study.    No orders of the defined types were placed in this encounter.   All questions were answered. The patient knows to call the clinic with any problems, questions or concerns.  I have spent a total of 25 minutes minutes of face-to-face and non-face-to-face time, preparing to see the patient, obtaining and/or reviewing separately obtained history, performing a medically appropriate examination, counseling and educating the patient, ordering medications/tests, documenting clinical information in the electronic health record and care coordination.   Dede Query, PA-C Department of Hematology/Oncology [facility] Phone: [phone]

## 2020-11-09 ENCOUNTER — Inpatient Hospital Stay: Payer: BC Managed Care – PPO | Attending: Hematology | Admitting: Dietician

## 2020-11-09 NOTE — Progress Notes (Signed)
Nutrition Follow-up:  Patient has completed concurrent chemoradiation therapy for tongue cancer.   Spoke with patient via telephone. He reports still playing catch up after recent stomach bug. Noted 5/24 office visit patient complained of worsening fatigue and 3-4 episodes of diarrhea over several days. Patient says he was dehydrated and felt much better after receiving IVF. Patient reports having occasional diarrhea, takes Imodium as needed. He continues to give 6 cartons of Glucerna daily. He enjoys cooking and trying new foods. Patient reports he has tried potatoes every way and does not like eating them as he used to. He reports taste is improving slowly, able to taste sweets the best. Patient is making little smokies with sweet BBQ sauce for dinner tonight. Patient recalls drinking 3-4 bottles of water. He has been eating cornbread with milk, pimento cheese on biscuit, ice cream, nabs, and continues to drink [employer], but does not drink them everyday. He is not completing SLP exercises daily, says he does good for a couple of days and then forgets to do them. Patient reports he is trying to get better with this and knows that it is important. Patient has SLP appointment tomorrow.   Medications: reviewed   Labs: 5/24 Glucose 214, BUN 35  Anthropometrics: Weight 165 lb 1.6 oz on 5/24 decreased from 168 lb 11.2 oz on 4/27  4/1 - 167 lb 8 oz 3/8 - 168 lb 12.8 oz   Estimated Energy Needs  Kcals: 0630-1601 Protein: 125-145 Fluid: 2.5 L  NUTRITION DIAGNOSIS: Inadequate energy intake stable  INTERVENTION:  Continue Glucerna 1.5 - 2 cartons TID with 60 ml water before and after each feeding. This provides 2136 kcal, 117.6 grams protein, 1440 ml water Drink by mouth or give via tube additional 1440 ml water daily Continue drinking Farilife Shake for added calories and protein Encouraged intake of soft, moist high protein foods as tolerated, pt has handout Discussed strategies for  altered taste Encouraged completing SLP exercises daily, recommended setting an alarm as reminder  Continue baking soda/salt water rinses  Imodium as needed for diarrhea Patient has contact information    MONITORING, EVALUATION, GOAL: weight trends, intake, tube feeding   NEXT VISIT: Friday, July 8 in clinic

## 2020-11-10 ENCOUNTER — Other Ambulatory Visit: Payer: Self-pay

## 2020-11-10 ENCOUNTER — Ambulatory Visit: Payer: BC Managed Care – PPO | Attending: Radiation Oncology

## 2020-11-10 DIAGNOSIS — R1310 Dysphagia, unspecified: Secondary | ICD-10-CM | POA: Diagnosis not present

## 2020-11-10 NOTE — Therapy (Signed)
[facility] [address] [place], [place], [address] Phone: [phone]   Fax:  [phone]  Speech Language Pathology Treatment  Patient Details  Name: Jose Harvey MRN: [id number] Date of Birth: [birth year] Referring Provider (SLP): Eppie Gibson, MD   Encounter Date: 11/10/2020   End of Session - 11/10/20 1257    Visit Number 5    Number of Visits 7    Date for SLP Re-Evaluation 01/05/21    SLP Start Time 1148    SLP Stop Time  1222    SLP Time Calculation (min) 34 min    Activity Tolerance Patient tolerated treatment well           Past Medical History:  Diagnosis Date  . Adrenal adenoma, left 03/2017   noted on CT  . Allergic rhinitis, cause unspecified   . Anxiety state, unspecified   . Aortic atherosclerosis ([place]) 03/2017   noted on CT  . BPV (benign positional vertigo)   . COPD (chronic obstructive pulmonary disease) with emphysema (HCC)    mild followed by [facility]   . Coronary artery disease   . Depressive disorder, not elsewhere classified   . Dyspnea    history of  . ED (erectile dysfunction)   . Gallbladder polyp 03/2016   noted on CT  . Hepatic hemangioma 03/2017   noted on CT  . History of kidney stones    hx of years ago   . Indigestion   . 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 Mixed hyperlipidemia   . Prostate cancer ([place])   . 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 Type I (juvenile type) diabetes mellitus without mention of complication, not stated as uncontrolled    type I - followed by Dr Buddy Duty   . Vesicoureteral reflux     Past Surgical History:  Procedure Laterality Date  . CHOLECYSTECTOMY N/A 05/17/2016   Procedure: LAPAROSCOPIC CHOLECYSTECTOMY;  Surgeon: Clovis Riley, MD;  Location: [facility];  Service: General;  Laterality: N/A;  . cyst removed from throat     . dental  surgeries     . IR GASTROSTOMY TUBE MOD SED  07/07/2020  . IR IMAGING GUIDED PORT INSERTION  07/07/2020  . LYMPHADENECTOMY Bilateral 07/02/2017   Procedure: BILATERAL LYMPHADENECTOMY;  Surgeon: Lucas Mallow, MD;  Location: [facility];  Service: Urology;  Laterality: Bilateral;  . PROSTATE BIOPSY    . ROBOT ASSISTED LAPAROSCOPIC RADICAL PROSTATECTOMY N/A 07/02/2017   Procedure: XI ROBOTIC ASSISTED LAPAROSCOPIC RADICAL PROSTATECTOMY;  Surgeon: Lucas Mallow, MD;  Location: [facility];  Service: Urology;  Laterality: N/A;  . TONSILLECTOMY    . VIDEO ASSISTED THORACOSCOPY (VATS)/ LOBECTOMY Right 05/08/2019   Procedure: Right VIDEO ASSISTED THORACOSCOPY with   MIDDLE LOBECTOMY and Enbloc portion of Upper Lobe with Node dissection, Intercostal Nerve Block;  Surgeon: Melrose Nakayama, MD;  Location: [facility];  Service: Thoracic;  Laterality: Right;  . WISDOM TOOTH EXTRACTION      There were no vitals filed for this visit.   Subjective Assessment - 11/10/20 1150    Subjective Pt just got over stomach bug.    Currently in Pain? No/denies                 ADULT SLP TREATMENT - 11/10/20 1151      General Information   Behavior/Cognition Alert;Cooperative;Pleasant mood      Treatment Provided  Treatment provided Dysphagia      Dysphagia Treatment   Temperature Spikes Noted No    Respiratory Status Room air    Treatment Methods Compensation strategy training;Therapeutic exercise;Patient/caregiver education;Skilled observation    Patient observed directly with PO's Yes    Type of PO's observed Dysphagia 1 (puree);Thin liquids    Oral Phase Signs & Symptoms Other (comment)   reports mild nasal regurgitation   Pharyngeal Phase Signs & Symptoms Multiple swallows;Audible swallow;Immediate throat clear;Complaints of residue   reported residue more on rt than lt   Type of cueing Verbal    Amount of cueing Minimal   occasional- min cues faded to modified independent for strategy use (head turn  rt with effortful swallow, alternate bite/sip   Other treatment/comments No overt s/sx aspiration PNA today. Jose Harvey has attempted to eat peanut butter and crackers, cornbread in milk, some bites of different kids of potatoes, pimento cheese, potato salad, stewed chicken, vienna sausages. Smoothest thing to have were cornbread in milk, pimento cheese, and grits. SLP strongly encouraged pt to have pureed food and softer food diet 3x/day. Pt without clearing throat with water, but began clearing throat with reported "stuff coming into my sinuses" (nasal regurgitation?). SLP had pt swallow hard and this was alleviated. Pharyngeal residue reported more on rt than lt so SLP had pt do head turn to rt to alleviate and this was successful. SLP told pt to take smaller bites solids, alternate solid/liquid, and turn head to rt for all swallows.Pt's bil submandibular area was palpated and some firmness was felt bil under jaw, rt sligtly worse than lt. SLP STRONGLY encouraged pt to complete HEP x2-3 until he sees SLP again. HEP completed with modifed independence.      Assessment / Recommendations / Plan   Plan Goals updated      Dysphagia Recommendations   Diet recommendations Dysphagia 1 (puree);Dysphagia 2 (fine chop);Dysphagia 3 (mechanical soft);Thin liquid    Liquids provided via Cup    Medication Administration --   as tolerated   Compensations Small sips/bites;Follow solids with liquid;Effortful swallow    Postural Changes and/or Swallow Maneuvers Head turn right during swallow      Progression Toward Goals   Progression toward goals Not progressing toward goals (comment)   cont without, or greatly reduced, completion of HEP compared to what was prescribed by SLP           SLP Education - 11/10/20 1256    Education Details needs to complete HEP x2-3 until next session, rationale for HEP, swallow precautions to limit/eliminate suspected phayngeal stasis    Person(s) Educated Patient    Methods  Explanation;Demonstration;Verbal cues;Handout    Comprehension Verbalized understanding;Returned demonstration;Need further instruction;Verbal cues required            SLP Short Term Goals - 10/07/20 1652      SLP SHORT TERM GOAL #1   Title Pt will complete HEP with rare min A over two sessions    Baseline 09-09-20    Period --   visits, for all STGs   Status Achieved      SLP SHORT TERM GOAL #2   Title Pt will tell SLP rationale for HEP completion over two sessions    Baseline 09-09-20    Status Achieved      SLP SHORT TERM GOAL #3   Title Pt will tell SLP how a food journal can hasten/facilitate return to more normalized diet    Status Deferred  SLP Long Term Goals - 11/10/20 1300      SLP LONG TERM GOAL #1   Title Pt will complete HEP with modified independence over 3 visits    Baseline 10-07-20, 11-10-20    Status Partially Met   and ongoing     SLP LONG TERM GOAL #2   Title Pt will tell SLP 3 overt s/s aspiration PNA with modified independence    Status Achieved      SLP LONG TERM GOAL #3   Title Pt will tell SLP when to decr frequency of HEP    Time 2    Status On-going      SLP LONG TERM GOAL #4   Title pt will indicate how a food journal can facilitate faster return to pre-rad diet    Time 1    Status On-going      SLP LONG TERM GOAL #5   Title pt will follow precuations (head turn rt, small bites, effortful swallow) with POs in session, x2 sessions    Time 2    Status New            Plan - 11/10/20 1257    Clinical Impression Statement Jose Harvey presents today with at least [facility] (but not WNL)  swallowing ability with dys I and thin liquids. See "other comments" for details.  Data suggests that as pts progress through rad or chemorad therapy that their swallowing ability will decrease. 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 SLP reviewed Jose Harvey' indivdualized exercise program to  mitigate/eliminate pt's muscle fibrosis following and as a result of, pt's rad tx. Pt continues with suboptimal completion of HEP yet completes the exercises with modified independence - some firmness was palpated bil nder pt's jaw today so strongly encouraged pt to incr frequency of HEP to recommended frequency. Skilled ST would cont to be beneficial to the pt in order to regularly assess pt's safety with POs and/or need for instrumental swallow assessment, as well as to assess accurate completion of swallowing HEP. Pt may require objective swallow eval after next session if dysphagia and/or muscle fibrosis appears, and/or appears to have worsened    Speech Therapy Frequency --   once every approx 4 weeks   Duration --   7 total visits   Treatment/Interventions Aspiration precaution training;Pharyngeal strengthening exercises;Diet toleration management by SLP;Trials of upgraded texture/liquids;Internal/external aids;Patient/family education;Compensatory strategies;SLP instruction and feedback    Potential to Achieve Goals Good    SLP Home Exercise Plan provided today    Consulted and Agree with Plan of Care Patient           Patient will benefit from skilled therapeutic intervention in order to improve the following deficits and impairments:   Dysphagia, unspecified type    Problem List Patient Active Problem List   Diagnosis Date Noted  . Dehydration 10/27/2020  . Hypotension 10/27/2020  . Diarrhea 10/27/2020  . Port-A-Cath in place 07/13/2020  . Counseling regarding advance care planning and goals of care 06/29/2020  . 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 S/P thoracotomy 05/08/2019  . Prostate cancer ([place]) 07/02/2017  . Malignant neoplasm of prostate ([place]) 06/06/2017  . DOE (dyspnea on exertion) 07/28/2014  . COPD (chronic obstructive pulmonary disease) ([place]) 01/30/2014  . Throat fullness 01/30/2014  . Paroxysmal nocturnal dyspnea 11/17/2013  . Hyperlipidemia 11/17/2013   . Insulin dependent diabetes mellitus 11/17/2013  . Essential hypertension 11/17/2013    [facility] ,MS,  CCC-SLP  11/10/2020, 1:02 PM  [facility] [address] [place], [place], [address] Phone: [phone]   Fax:  [phone]   Name: Jose Harvey MRN: [id number] Date of Birth: 1960/09/28

## 2020-11-10 NOTE — Patient Instructions (Signed)
    You seemed to have food and liquid travel easier through your throat when you turned your head to the right and swallowed. Do this whenever you eat or drink.  Swallow as hard as you can when you eat or drink anything.  DO YOUR EXERCISES at least x2/day - even three times a day until I see you again- every day.

## 2020-11-12 ENCOUNTER — Ambulatory Visit (INDEPENDENT_AMBULATORY_CARE_PROVIDER_SITE_OTHER): Payer: Dental | Admitting: Dentistry

## 2020-11-12 ENCOUNTER — Encounter (HOSPITAL_COMMUNITY): Payer: Self-pay | Admitting: Dentistry

## 2020-11-12 ENCOUNTER — Other Ambulatory Visit: Payer: Self-pay

## 2020-11-12 DIAGNOSIS — K117 Disturbances of salivary secretion: Secondary | ICD-10-CM

## 2020-11-12 DIAGNOSIS — Y842 Radiological procedure and radiotherapy as the cause of abnormal reaction of the patient, or of later complication, without mention of misadventure at the time of the procedure: Secondary | ICD-10-CM

## 2020-11-12 DIAGNOSIS — Z5189 Encounter for other specified aftercare: Secondary | ICD-10-CM

## 2020-11-12 DIAGNOSIS — R634 Abnormal weight loss: Secondary | ICD-10-CM | POA: Diagnosis not present

## 2020-11-12 DIAGNOSIS — R432 Parageusia: Secondary | ICD-10-CM

## 2020-11-12 MED ORDER — SODIUM FLUORIDE 1.1 % DT CREA: 1.0000 "application " | TOPICAL_CREAM | Freq: Every evening | DENTAL | 2 refills | Status: AC

## 2020-11-12 NOTE — Progress Notes (Deleted)
Department of Dental Medicine     LIMITED EXAM  Service Date:   11/12/2020  Patient Name:   Jose Harvey Date of Birth:   [birth year] Medical Record Number: [id number]  TODAY'S VISIT   Assessment:   Xerostomia, dysgeusia, weight loss  Recommendations:  Brush after meals and at bedtime.  Use fluoride at bedtime. Use trismus exercises as directed. Try using CLoSYS for dry mouth and salt water/baking soda rinses for any mouth sores as needed. Take multiple sips of water as needed. Plan: Follow-up as needed.  Call if any questions or concerns arise. Rx:  Sodium fluoride 1.1% paste called in to pharmacy today Return to primary dentist  for routine dental care including replacement of missing teeth as needed, cleanings/periodontal therapy and exams.  Discussed in detail all treatment options and recommendations with the patient and they are agreeable to the plan.  PROGRESS NOTE:   COVID-19 SCREENING:  The patient denies symptoms concerning for COVID-19 infection including fever, chills, cough, or newly developed shortness of breath.   HISTORY OF PRESENT ILLNESS: Jose Harvey is a 60 y.o. male who presents today for an oral examination after chemoradiation therapy for base of tongue cancer.  The patient has completed 35 of 35 radiation treatments from 07/05/20 to 08/20/20.  He has completed 6 of 6 chemotherapy treatments.   REVIEW OF CHIEF COMPLAINTS: Dry mouth: Yes.  He reports this is the worst at night or when he has been talking for a while. Hard to swallow: No Hurts to swallow:  No Taste changes:  Yes.  Taste is slowly returning. Sores in mouth:  No Limited opening:  Denies symptoms related to trismus Weight loss:  Yes, still uses feeding tube. 165 lbs down from 182 lbs   AT- HOME ORAL HYGIENE REGIMEN: Brushing:  Yes, twice a day Flossing:  Not flossing Rinsing:  Using salt water and baking soda rinses 4 times a day Fluoride treatments:  Receiving fluoride  trays today Trismus exercises:  Yes, reports doing exercises. Maximum Interincisal Opening:  42 mm today; was close at 43 mm when measured on 06/14/20.   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   S/P thoracotomy 05/08/2019   Prostate cancer ([place]) 07/02/2017   Malignant neoplasm of prostate ([place]) 06/06/2017   DOE (dyspnea on exertion) 07/28/2014   COPD (chronic obstructive pulmonary disease) ([place]) 01/30/2014   Throat fullness 01/30/2014   Paroxysmal nocturnal dyspnea 11/17/2013   Hyperlipidemia 11/17/2013   Insulin dependent diabetes mellitus 11/17/2013   Essential hypertension 11/17/2013   Past Medical History:  Diagnosis Date   Adrenal adenoma, left 03/2017   noted on CT   Allergic rhinitis, cause unspecified    Anxiety state, unspecified    Aortic atherosclerosis ([place]) 03/2017   noted on CT   BPV (benign positional vertigo)    COPD (chronic obstructive pulmonary disease) with emphysema (HCC)    mild followed by [facility]    Coronary artery disease    Depressive disorder, not elsewhere classified    Dyspnea    history of   ED (erectile dysfunction)    Gallbladder polyp 03/2016   noted on CT   Hepatic hemangioma 03/2017   noted on CT   History of kidney stones  hx of years ago    Indigestion    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)    Mixed hyperlipidemia    Prostate cancer ([place])    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, not stated as uncontrolled(250.51)    Type I (juvenile type) diabetes mellitus without mention of complication, not stated as uncontrolled    type I - followed by Dr Buddy Duty    Vesicoureteral reflux    Past Surgical History:  Procedure Laterality Date    CHOLECYSTECTOMY N/A 05/17/2016   Procedure: LAPAROSCOPIC CHOLECYSTECTOMY;  Surgeon: Clovis Riley, MD;  Location: [facility];  Service: General;  Laterality: N/A;   cyst removed from throat      dental surgeries      IR GASTROSTOMY TUBE MOD SED  07/07/2020   IR IMAGING GUIDED PORT INSERTION  07/07/2020   LYMPHADENECTOMY Bilateral 07/02/2017   Procedure: BILATERAL LYMPHADENECTOMY;  Surgeon: Lucas Mallow, MD;  Location: [facility];  Service: Urology;  Laterality: Bilateral;   PROSTATE BIOPSY     ROBOT ASSISTED LAPAROSCOPIC RADICAL PROSTATECTOMY N/A 07/02/2017   Procedure: XI ROBOTIC ASSISTED LAPAROSCOPIC RADICAL PROSTATECTOMY;  Surgeon: Lucas Mallow, MD;  Location: [facility];  Service: Urology;  Laterality: N/A;   TONSILLECTOMY     VIDEO ASSISTED THORACOSCOPY (VATS)/ LOBECTOMY Right 05/08/2019   Procedure: Right VIDEO ASSISTED THORACOSCOPY with   MIDDLE LOBECTOMY and Enbloc portion of Upper Lobe with Node dissection, Intercostal Nerve Block;  Surgeon: Melrose Nakayama, MD;  Location: [facility];  Service: Thoracic;  Laterality: Right;   WISDOM TOOTH EXTRACTION     Current Outpatient Medications  Medication Sig Dispense Refill   lidocaine (XYLOCAINE) 2 % solution Patient: Mix 1part 2% viscous lidocaine, 1part H20. Swish & swallow 74mL of diluted mixture, 99min before meals and at bedtime, up to QID 200 mL 4   aspirin 81 MG tablet Take 1 tablet (81 mg total) by mouth daily. 30 tablet    cetirizine (ZYRTEC) 10 MG tablet Take 10 mg by mouth daily.     chlorhexidine (PERIDEX) 0.12 % solution Use as directed 15 mLs in the mouth or throat daily.     Coenzyme Q10 (COQ10) 100 MG CAPS Take 100 mg by mouth daily.     Continuous Blood Gluc Sensor (FREESTYLE LIBRE 14 DAY SENSOR) MISC AS DIRECTED CHANGE SENSOR EVERY 14 DAYS 28     dexamethasone (DECADRON) 4 MG tablet Take 2 tablets (8 mg total) by mouth daily. Start the day after chemotherapy for 2 days. 30 tablet 1   ibuprofen (ADVIL) 200 MG tablet Take  400-800 mg by mouth every 6 (six) hours as needed for moderate pain. (Patient not taking: Reported on 06/17/2020)     Insulin Human (INSULIN PUMP) SOLN Inject into the skin continuous. NOVOLOG 100 UNIT/ML VIAL     lidocaine-prilocaine (EMLA) cream Apply to affected area once 30 g 3   lisinopril (PRINIVIL,ZESTRIL) 5 MG tablet Take 5 mg by mouth daily.      LORazepam (ATIVAN) 0.5 MG tablet Take 1 tablet (0.5 mg total) by mouth every 6 (six) hours as needed (Nausea or vomiting). 30 tablet 0   magic mouthwash w/lidocaine SOLN 34ml oral swish and swallow 4 times daily as needed  1 Part viscous lidocaine 2% 1 Part Maalox 1 Part diphenhydramine 12.5 mg per 5 ml elixir 1 part Nystatin 500000 units per 69ml 200 mL 2   Multiple Vitamins-Minerals (ICAPS AREDS 2) CAPS Take 1 capsule by mouth daily.  NOVOLOG 100 UNIT/ML injection Averages about 30 units per day via insulin pump     Nutritional Supplements (FEEDING SUPPLEMENT, GLUCERNA 1.5 CAL,) LIQD Give 2 cartons at 8am, noon and 4pm. Give 1 carton at 8pm. Flush with 28ml of water before and after.  Drink or give additional water via tube of 241ml 3 times per day.  Meets 100% of needs.  Send bolus supplies. 1659 mL 5   ondansetron (ZOFRAN) 8 MG tablet Take 1 tablet (8 mg total) by mouth 2 (two) times daily as needed for refractory nausea / vomiting. Start on day 3 after chemo. (Patient not taking: Reported on 09/01/2020) 30 tablet 1   oxyCODONE (ROXICODONE) 5 MG/5ML solution Take 5 mLs (5 mg total) by mouth every 6 (six) hours as needed for severe pain. 200 mL 0   pantoprazole (PROTONIX) 40 MG tablet Take 40 mg by mouth daily.     prochlorperazine (COMPAZINE) 10 MG tablet Take 1 tablet (10 mg total) by mouth every 6 (six) hours as needed (Nausea or vomiting). 30 tablet 1   rosuvastatin (CRESTOR) 5 MG tablet Take 1 tablet (5 mg total) by mouth daily. 90 tablet 3   sertraline (ZOLOFT) 100 MG tablet Take 50 mg by mouth daily.  0   sucralfate (CARAFATE) 1 g  tablet Take 1 tablet (1 g total) by mouth 4 (four) times daily -  with meals and at bedtime. (Patient not taking: Reported on 09/01/2020) 90 tablet 1   No current facility-administered medications for this visit.   Allergies  Allergen Reactions   Crestor [Rosuvastatin]     Muscle aches in higher dose   Lipitor [Atorvastatin]     Muscle aches   Other Other (See Comments)    Any antihistamines Causes shaking   Zocor [Simvastatin] Other (See Comments)    Muscle aches     VITALS: BP 98/65 (BP Location: Right Arm, Patient Position: Sitting, Cuff Size: Normal)   Pulse 69   Temp 98.4 F (36.9 C) (Oral)   Wt 165 lb (74.8 kg)   BMI 23.01 kg/m    DENTAL EXAM: Oral hygiene (plaque):  No plaque accumulation evident Mucositis:  No Description of saliva:  Viscous saliva, foamy consistency noted on teeth, less quantity Exposed bone:  No Other watched areas:  #28 occlusal lesion has progressed and #25 incisal edge needs to be monitored.   ASSESSMENT/DIAGNOSES:  Xerostomia:  He reports his dry mouth is not terrible for most of the day except when he has been talking for a little bit.  At night is when he notices the side effect the most.  He does take multiple sips of water when needed and has tried Biotine with no success.  Samples and coupons for the OTC CLoSYS brand given to patient to try out today. Dysgeusia:  He reports that his taste continues to improve and certain foods/drinks are starting to taste normally again. Weight Loss:  He does still have his feeding tube in place to use when needed, but he says that he is consuming more by mouth now.  He continues to work towards having it removed.   PLAN AND RECOMMENDATIONS: Brush after meals and at bedtime.  Use fluoride at bedtime. Use trismus exercises as directed.  Although your maximum opening has not decreased, we encourage you to continue to do the exercises to prevent trismus that may progress over time. Try using CLoSYs for dry  mouth and salt water/baking soda rinses for any mouth sores as needed. Take  multiple sips of water as needed.  Return to regular dentist for routine dental care including cleanings, periodic exams and replacement of missing teeth as needed. Call if any problems or concerns arise.  All questions and concerns were invited and addressed.  The patient tolerated today's visit well and departed in stable condition.   Bee Benson [place], D.M.D.

## 2020-11-12 NOTE — Patient Instructions (Signed)
Jose Harvey, D.M.D. Phone: [phone] Fax: [phone]    It was a pleasure seeing you today!  Please refer to the information below regarding your dental visit with [place] and your new fluoride trays.  Call us if any questions or concerns come up after you leave.   Thank you for giving [place] the opportunity to provide care for you.  If there is anything we can do for you, please let us know.    RECOMMENDATIONS: Brush after meals and at bedtime.  Floss once a day, and use fluoride at bedtime. Use trismus exercises as directed below. Use CLOSYs for dry mouth, and salt water/baking soda rinses to help with any mouth sores. Take multiple sips of water as needed.  Stay hydrated. Return to your regular dentist for routine dental care including cleanings and periodic exams.  If you do not have a regular dentist, it is important to establish care at an outside dental office of your choice.  Call us if any problems or concerns arise.   TRISMUS: Trismus is a condition where the jaw does not allow the mouth to open as wide as it usually does.  This can happen almost suddenly, or in other cases the process is so slow, it is hard to notice it-until it is too far along.  When the jaw joints and/or muscles have been exposed to radiation treatments, the onset of trismus is very slow.  This is because the muscles are losing their stretching ability over a long period of time, as long as 2 YEARS after the end of radiation.  It is therefore important to exercise these muscles and joints.  Trismus exercises: Use the stack of tongue depressors measuring the same or a little less than your maximum opening that was recorded at your first dental visit. Place the stack in your mouth, supporting the other end with your hand. Allow 30 seconds for muscle stretching. Rest for a few seconds. Repeat 3-5 times. For all radiation patients, this exercise is recommended  in the mornings and evenings unless otherwise instructed. The exercises should be done for a period of 2 YEARS after the end of radiation. Maximum jaw opening should be checked routinely on recall dental visits by your general dentist. Report any changes, soreness, or difficulties encountered when doing the exercises to your dentist.     [place]   How do I use my fluoride trays? The best time to use your Fluoride is in the morning after breakfast, or right before bed time.  You must brush your teeth very well and floss before using the Fluoride in order to get the best use out of the Fluoride treatments. Place 1 drop of Fluoride in each tooth space in the tray.  You do not need to use more. Place the tray(s) over your teeth.  Make sure the trays are seated all the way.  Remember, they only fit one way on your teeth. Clench your teeth together a few times with light chewing motions to distribute the fluoride. Leave the trays in place for a full 5 minutes.  You may drool a bit, so keep a tissue handy. At the end of the 5 minutes, take the trays out.  SPIT OUT the remaining fluoride, but DO NOT RINSE. DO NOT eat or drink after treatments for at least 30 minutes.  This is why the best time for your treatments is before bedtime.  How do I care for my  fluoride trays? Clean the inside of your Fluoride trays using cold water and a toothbrush. In order to keep your trays from discoloring and free from odors, soak them overnight in denture cleaners or soap and water.  Do not use bleach or non-denture products. Store the trays in a safe dry place AWAY from any heat until your next treatment.  Finally, be sure to let your pharmacy know when you are close to needing a new refill for your fluoride prescription so they have it ready for you without interruption of fluoride use.    QUESTIONS? If anything happens to your Fluoride trays, or they don't fit as well after any dental work,  please let us know as soon as possible.  Call the [facility] to schedule an appointment at [phone].

## 2020-11-12 NOTE — Progress Notes (Addendum)
Department of Dental Medicine     LIMITED EXAM  Service Date:   11/12/2020  Patient Name:   Jose Harvey Date of Birth:   [birth year] Medical Record Number: [id number]  TODAY'S VISIT   Assessment:   Xerostomia, dysgeusia, weight loss Procedures:  Delivered upper and lower fluoride trays.  Recommendations:  Brush after meals and at bedtime.  Use fluoride at bedtime. Use trismus exercises as directed. Try using CLoSYS for dry mouth and salt water/baking soda rinses for any mouth sores as needed. Take multiple sips of water as needed. Plan: Follow-up as needed.  Call if any questions or concerns arise. Rx:  Sodium fluoride 1.1% paste called in to pharmacy today Return to primary dentist  for routine dental care including replacement of missing teeth as needed, cleanings/periodontal therapy and exams.  Discussed in detail all treatment options and recommendations with the patient and they are agreeable to the plan.  PROGRESS NOTE:   COVID-19 SCREENING:  The patient denies symptoms concerning for COVID-19 infection including fever, chills, cough, or newly developed shortness of breath.   HISTORY OF PRESENT ILLNESS: Jose Harvey is a 60 y.o. 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  The patient has completed 35 of 35 radiation treatments from 07/05/20 to 08/20/20.  He has completed 6 of 6 chemotherapy treatments.   REVIEW OF CHIEF COMPLAINTS: Dry mouth: Yes.  He reports this is the worst at night or when he has been talking for a while. Hard to swallow: No Hurts to swallow:  No Taste changes:  Yes.  Taste is slowly returning. Sores in mouth:  No Limited opening:  Denies symptoms related to trismus Weight loss:  Yes, still uses feeding tube. 165 lbs down from 182 lbs   AT- HOME ORAL HYGIENE REGIMEN: Brushing:  Yes, twice a day Flossing:  Not  flossing Rinsing:  Using salt water and baking soda rinses 4 times a day Fluoride treatments:  Receiving fluoride trays today Trismus exercises:  Yes, reports doing exercises. Maximum Interincisal Opening:  42 mm today; was close at 43 mm when measured on 06/14/20.   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   S/P thoracotomy 05/08/2019   Prostate cancer ([place]) 07/02/2017   Malignant neoplasm of prostate ([place]) 06/06/2017   DOE (dyspnea on exertion) 07/28/2014   COPD (chronic obstructive pulmonary disease) ([place]) 01/30/2014   Throat fullness 01/30/2014   Paroxysmal nocturnal dyspnea 11/17/2013   Hyperlipidemia 11/17/2013   Insulin dependent diabetes mellitus 11/17/2013   Essential hypertension 11/17/2013   Past Medical History:  Diagnosis Date   Adrenal adenoma, left 03/2017   noted on CT   Allergic rhinitis, cause unspecified    Anxiety state, unspecified    Aortic atherosclerosis ([place]) 03/2017   noted on CT   BPV (benign positional vertigo)    COPD (chronic obstructive pulmonary disease) with emphysema (HCC)    mild followed by  Pulmonary    Coronary artery disease    Depressive disorder, not elsewhere classified    Dyspnea    history of   ED (erectile dysfunction)    Gallbladder polyp 03/2016   noted on CT  Hepatic hemangioma 03/2017   noted on CT   History of kidney stones    hx of years ago    Indigestion    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)    Mixed hyperlipidemia    Prostate cancer ([place])    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, not stated as uncontrolled(250.51)    Type I (juvenile type) diabetes mellitus without mention of complication, not stated as uncontrolled    type  I - followed by Dr Buddy Duty    Vesicoureteral reflux    Past Surgical History:  Procedure Laterality Date   CHOLECYSTECTOMY N/A 05/17/2016   Procedure: LAPAROSCOPIC CHOLECYSTECTOMY;  Surgeon: Clovis Riley, MD;  Location: [facility];  Service: General;  Laterality: N/A;   cyst removed from throat      dental surgeries      IR GASTROSTOMY TUBE MOD SED  07/07/2020   IR IMAGING GUIDED PORT INSERTION  07/07/2020   LYMPHADENECTOMY Bilateral 07/02/2017   Procedure: BILATERAL LYMPHADENECTOMY;  Surgeon: Lucas Mallow, MD;  Location: [facility];  Service: Urology;  Laterality: Bilateral;   PROSTATE BIOPSY     ROBOT ASSISTED LAPAROSCOPIC RADICAL PROSTATECTOMY N/A 07/02/2017   Procedure: XI ROBOTIC ASSISTED LAPAROSCOPIC RADICAL PROSTATECTOMY;  Surgeon: Lucas Mallow, MD;  Location: [facility];  Service: Urology;  Laterality: N/A;   TONSILLECTOMY     VIDEO ASSISTED THORACOSCOPY (VATS)/ LOBECTOMY Right 05/08/2019   Procedure: Right VIDEO ASSISTED THORACOSCOPY with   MIDDLE LOBECTOMY and Enbloc portion of Upper Lobe with Node dissection, Intercostal Nerve Block;  Surgeon: Melrose Nakayama, MD;  Location: [facility];  Service: Thoracic;  Laterality: Right;   WISDOM TOOTH EXTRACTION     Current Outpatient Medications  Medication Sig Dispense Refill   lidocaine (XYLOCAINE) 2 % solution Patient: Mix 1part 2% viscous lidocaine, 1part H20. Swish & swallow 77mL of diluted mixture, 43min before meals and at bedtime, up to QID 200 mL 4   sodium fluoride (SODIUM FLUORIDE 5000 PLUS) 1.1 % CREA dental cream Place 1 application onto teeth every evening. Place 1 pea-size drop into each tooth space in fluoride trays. Use once a day a night. 30 g 2   aspirin 81 MG tablet Take 1 tablet (81 mg total) by mouth daily. 30 tablet    cetirizine (ZYRTEC) 10 MG tablet Take 10 mg by mouth daily.     chlorhexidine (PERIDEX) 0.12 % solution Use as directed 15 mLs in the mouth or throat daily.     Coenzyme Q10 (COQ10) 100 MG CAPS Take 100  mg by mouth daily.     Continuous Blood Gluc Sensor (FREESTYLE LIBRE 14 DAY SENSOR) MISC AS DIRECTED CHANGE SENSOR EVERY 14 DAYS 28     dexamethasone (DECADRON) 4 MG tablet Take 2 tablets (8 mg total) by mouth daily. Start the day after chemotherapy for 2 days. 30 tablet 1   ibuprofen (ADVIL) 200 MG tablet Take 400-800 mg by mouth every 6 (six) hours as needed for moderate pain. (Patient not taking: Reported on 06/17/2020)     Insulin Human (INSULIN PUMP) SOLN Inject into the skin continuous. NOVOLOG 100 UNIT/ML VIAL     lidocaine-prilocaine (EMLA) cream Apply to affected area once 30 g 3   lisinopril (PRINIVIL,ZESTRIL) 5 MG tablet Take 5 mg by mouth daily.      LORazepam (ATIVAN) 0.5 MG tablet Take 1 tablet (0.5 mg total) by mouth every 6 (six) hours as needed (Nausea or vomiting). 30 tablet 0   magic  mouthwash w/lidocaine SOLN 38ml oral swish and swallow 4 times daily as needed  1 Part viscous lidocaine 2% 1 Part Maalox 1 Part diphenhydramine 12.5 mg per 5 ml elixir 1 part Nystatin 500000 units per 29ml 200 mL 2   Multiple Vitamins-Minerals (ICAPS AREDS 2) CAPS Take 1 capsule by mouth daily.     NOVOLOG 100 UNIT/ML injection Averages about 30 units per day via insulin pump     Nutritional Supplements (FEEDING SUPPLEMENT, GLUCERNA 1.5 CAL,) LIQD Give 2 cartons at 8am, noon and 4pm. Give 1 carton at 8pm. Flush with 19ml of water before and after.  Drink or give additional water via tube of 243ml 3 times per day.  Meets 100% of needs.  Send bolus supplies. 1659 mL 5   ondansetron (ZOFRAN) 8 MG tablet Take 1 tablet (8 mg total) by mouth 2 (two) times daily as needed for refractory nausea / vomiting. Start on day 3 after chemo. (Patient not taking: Reported on 09/01/2020) 30 tablet 1   oxyCODONE (ROXICODONE) 5 MG/5ML solution Take 5 mLs (5 mg total) by mouth every 6 (six) hours as needed for severe pain. 200 mL 0   pantoprazole (PROTONIX) 40 MG tablet Take 40 mg by mouth daily.     prochlorperazine  (COMPAZINE) 10 MG tablet Take 1 tablet (10 mg total) by mouth every 6 (six) hours as needed (Nausea or vomiting). 30 tablet 1   rosuvastatin (CRESTOR) 5 MG tablet Take 1 tablet (5 mg total) by mouth daily. 90 tablet 3   sertraline (ZOLOFT) 100 MG tablet Take 50 mg by mouth daily.  0   sucralfate (CARAFATE) 1 g tablet Take 1 tablet (1 g total) by mouth 4 (four) times daily -  with meals and at bedtime. (Patient not taking: Reported on 09/01/2020) 90 tablet 1   No current facility-administered medications for this visit.   Allergies  Allergen Reactions   Crestor [Rosuvastatin]     Muscle aches in higher dose   Lipitor [Atorvastatin]     Muscle aches   Other Other (See Comments)    Any antihistamines Causes shaking   Zocor [Simvastatin] Other (See Comments)    Muscle aches     VITALS: BP 98/65 (BP Location: Right Arm, Patient Position: Sitting, Cuff Size: Normal)   Pulse 69   Temp 98.4 F (36.9 C) (Oral)   Wt 165 lb (74.8 kg)   BMI 23.01 kg/m    DENTAL EXAM: Oral hygiene (plaque):  No plaque accumulation evident Mucositis:  No Description of saliva:  Viscous saliva, foamy consistency noted on teeth, less quantity Exposed bone:  No Other watched areas:  #28 occlusal lesion has progressed and #25 incisal edge needs to be monitored.   ASSESSMENT/DIAGNOSES:  Xerostomia:  He reports his dry mouth is not terrible for most of the day except when he has been talking for a little bit.  At night is when he notices the side effect the most.  He does take multiple sips of water when needed and has tried Biotine with no success.  Samples and coupons for the OTC CLoSYS brand given to patient to try out today. Dysgeusia:  He reports that his taste continues to improve and certain foods/drinks are starting to taste normally again. Weight Loss:  He does still have his feeding tube in place to use when needed, but he says that he is consuming more by mouth now.  He continues to work towards  having it removed.   PROCEDURES: Delivery of  upper and lower fluoride trays.  Appliances were tried in.  No adjustments needed. Postoperative instructions were provided in a written and verbal format concerning the use and care of appliances.   PLAN AND RECOMMENDATIONS: Brush after meals and at bedtime.  Use fluoride at bedtime. Use trismus exercises as directed.  Although your maximum opening has not decreased, we encourage you to continue to do the exercises to prevent trismus that may progress over time. Try using CLoSYs for dry mouth and salt water/baking soda rinses for any mouth sores as needed. Take multiple sips of water as needed.  Return to regular dentist for routine dental care including cleanings, periodic exams and replacement of missing teeth as needed. Call if any problems or concerns arise.  All questions and concerns were invited and addressed.  The patient tolerated today's visit well and departed in stable condition.   [place] Benson [place], D.M.D.

## 2020-11-16 ENCOUNTER — Ambulatory Visit (INDEPENDENT_AMBULATORY_CARE_PROVIDER_SITE_OTHER): Payer: BC Managed Care – PPO | Admitting: Otolaryngology

## 2020-11-17 ENCOUNTER — Ambulatory Visit (INDEPENDENT_AMBULATORY_CARE_PROVIDER_SITE_OTHER): Payer: BC Managed Care – PPO | Admitting: Otolaryngology

## 2020-12-07 ENCOUNTER — Telehealth: Payer: Self-pay | Admitting: *Deleted

## 2020-12-07 NOTE — Telephone Encounter (Signed)
CALLED PATIENT TO INFORM THAT PET HASN'T BEEN PRE-AUTH, THEREFORE IT WILL BE CANCELLED AND RESCHEDULED ONCE PRE-AUTH IS OBTAINED, ALSO FU ON 12-10-20 WITH DR. Isidore Moos HAS BEEN CANCELLED AS WELL, DUE TO NO PET, THE FU APPT. WITH DR. Isidore Moos WILL BE RESCHEDULED ONCE PET IS RESCHEDULED, PATIENT VERIFIED UNDERSTANDING THIS

## 2020-12-08 ENCOUNTER — Other Ambulatory Visit: Payer: Self-pay

## 2020-12-08 ENCOUNTER — Telehealth: Payer: Self-pay | Admitting: *Deleted

## 2020-12-08 ENCOUNTER — Ambulatory Visit: Payer: BC Managed Care – PPO | Attending: Radiation Oncology

## 2020-12-08 ENCOUNTER — Telehealth: Payer: Self-pay | Admitting: Hematology

## 2020-12-08 DIAGNOSIS — R1310 Dysphagia, unspecified: Secondary | ICD-10-CM | POA: Diagnosis not present

## 2020-12-08 DIAGNOSIS — E103293 Type 1 diabetes mellitus with mild nonproliferative diabetic retinopathy without macular edema, bilateral: Secondary | ICD-10-CM | POA: Diagnosis not present

## 2020-12-08 DIAGNOSIS — Z794 Long term (current) use of insulin: Secondary | ICD-10-CM | POA: Diagnosis not present

## 2020-12-08 NOTE — Telephone Encounter (Signed)
Called patient to inform that scan has not been pre-auth yet, spoke with patient and he verified understanding this

## 2020-12-08 NOTE — Therapy (Signed)
[facility] [address] [place], [place], [address] Phone: [phone]   Fax:  [phone]  Speech Language Pathology Treatment  Patient Details  Name: Jose Harvey MRN: [id number] Date of Birth: Jul 06, 1960 Referring Provider (SLP): Eppie Gibson, MD   Encounter Date: 12/08/2020   End of Session - 12/08/20 1528     Visit Number 6    Number of Visits 7    Date for SLP Re-Evaluation 01/05/21    SLP Start Time 1147    SLP Stop Time  1228    SLP Time Calculation (min) 41 min    Activity Tolerance Patient tolerated treatment well             Past Medical History:  Diagnosis Date   Adrenal adenoma, left 03/2017   noted on CT   Allergic rhinitis, cause unspecified    Anxiety state, unspecified    Aortic atherosclerosis ([place]) 03/2017   noted on CT   BPV (benign positional vertigo)    COPD (chronic obstructive pulmonary disease) with emphysema ([place])    mild followed by [facility]    Coronary artery disease    Depressive disorder, not elsewhere classified    Dyspnea    history of   ED (erectile dysfunction)    Gallbladder polyp 03/2016   noted on CT   Hepatic hemangioma 03/2017   noted on CT   History of kidney stones    hx of years ago    Indigestion    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)    Mixed hyperlipidemia    Prostate cancer ([place])    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, not stated as uncontrolled(250.51)    Type I (juvenile type) diabetes mellitus without mention of complication, not stated as uncontrolled    type I - followed by Dr Buddy Duty    Vesicoureteral reflux     Past Surgical History:  Procedure Laterality Date   CHOLECYSTECTOMY N/A 05/17/2016   Procedure: LAPAROSCOPIC CHOLECYSTECTOMY;  Surgeon: Clovis Riley, MD;  [facility];  Service: General;  Laterality: N/A;   cyst removed from throat      dental surgeries      IR  GASTROSTOMY TUBE MOD SED  07/07/2020   IR IMAGING GUIDED PORT INSERTION  07/07/2020   LYMPHADENECTOMY Bilateral 07/02/2017   Procedure: BILATERAL LYMPHADENECTOMY;  Surgeon: Lucas Mallow, MD;  [facility];  Service: Urology;  Laterality: Bilateral;   PROSTATE BIOPSY     ROBOT ASSISTED LAPAROSCOPIC RADICAL PROSTATECTOMY N/A 07/02/2017   Procedure: XI ROBOTIC ASSISTED LAPAROSCOPIC RADICAL PROSTATECTOMY;  Surgeon: Lucas Mallow, MD;  [facility];  Service: Urology;  Laterality: N/A;   TONSILLECTOMY     VIDEO ASSISTED THORACOSCOPY (VATS)/ LOBECTOMY Right 05/08/2019   Procedure: Right VIDEO ASSISTED THORACOSCOPY with   MIDDLE LOBECTOMY and Enbloc portion of Upper Lobe with Node dissection, Intercostal Nerve Block;  Surgeon: Melrose Nakayama, MD;  Location: [facility];  Service: Thoracic;  Laterality: Right;   WISDOM TOOTH EXTRACTION      There were no vitals filed for this visit.   Subjective Assessment - 12/08/20 1156     Subjective "The things I can eat I'm getting sick of eating."    Currently in Pain? No/denies                   ADULT SLP TREATMENT - 12/08/20 1156       General Information   Behavior/Cognition  Alert;Cooperative;Pleasant mood      Treatment Provided   Treatment provided Dysphagia      Dysphagia Treatment   Temperature Spikes Noted No    Respiratory Status Room air    Treatment Methods Skilled observation;Compensation strategy training;Patient/caregiver education;Therapeutic exercise    Type of PO's observed Dysphagia 3 (soft);Thin liquids   reese's cup   Oral Phase Signs & Symptoms --   no sx noted today   Pharyngeal Phase Signs & Symptoms Delayed throat clear   25% of swallows H2O and solids   Other treatment/comments "As long as I'm drinking milk I can get it down." Jose Harvey reports needing liquid for more dense POs (meat) due to xerostomia. Maintaining weight. He states "sometimes I can eat and it feels just like it always did." Jose Harvey performing  effortful swallow, Mendelsohn, and supersupraglottic "a little", chin tuck against resistance at least 4-5 days/week, and Masako swallow 40-60/day every day. SLP strongly urged Jose Harvey to complete the entire scope of the HEP as directed and provided rationale. He acknowledged understanding. SLP encouraged Jose Harvey to attend follow up with dietician due to "s" statement. SLP told Jose Harvey SLP would like to see Jose Harvey have more POs.      Assessment / Recommendations / Plan   Plan Continue with current plan of care      Dysphagia Recommendations   Diet recommendations --   as tolerated   Liquids provided via Cup    Compensations Small sips/bites;Follow solids with liquid;Effortful swallow      Progression Toward Goals   Progression toward goals Progressing toward goals              SLP Education - 12/08/20 1528     Education Details complete entire scope of HEP    Person(s) Educated Patient    Methods Explanation    Comprehension Verbalized understanding              SLP Short Term Goals - 10/07/20 1652       SLP SHORT TERM GOAL #1   Title Jose Harvey will complete HEP with rare min A over two sessions    Baseline 09-09-20    Period --   visits, for all STGs   Status Achieved      SLP SHORT TERM GOAL #2   Title Jose Harvey will tell SLP rationale for HEP completion over two sessions    Baseline 09-09-20    Status Achieved      SLP SHORT TERM GOAL #3   Title Jose Harvey will tell SLP how a food journal can hasten/facilitate return to more normalized diet    Status Deferred              SLP Long Term Goals - 12/08/20 1531       SLP LONG TERM GOAL #1   Title Jose Harvey will complete HEP with modified independence over 3 visits    Baseline 10-07-20, 11-10-20    Status Partially Met   and ongoing     SLP LONG TERM GOAL #2   Title Jose Harvey will tell SLP 3 overt s/s aspiration PNA with modified independence    Status Achieved      SLP LONG TERM GOAL #3   Title Jose Harvey will tell SLP when to decr frequency of HEP    Time 1     Status On-going      SLP LONG TERM GOAL #4   Title Jose Harvey will indicate how a food journal can facilitate faster return to pre-rad diet  Time 1    Status Unable to assess      SLP LONG TERM GOAL #5   Title Jose Harvey will follow precuations (head turn rt, small bites, effortful swallow) with POs in session, x2 sessions    Time 1    Status Unable to assess              Plan - 12/08/20 1529     Clinical Impression Statement Jose Harvey presents today with at least [facility] (nearly WNL)  swallowing ability with dys III (Reese's cup) and thin liquids. See "other comments" for details.  Data suggests that as pts progress through rad or chemorad therapy that their swallowing ability will decrease. 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 SLP reviewed Jose Harvey' indivdualized exercise program to mitigate/eliminate Jose Harvey's muscle fibrosis following and as a result of, Jose Harvey's rad tx. Jose Harvey again continues with suboptimal completion of HEP and again completes the exercises with modified independence. Skilled ST would cont to be beneficial to the Jose Harvey in order to regularly assess Jose Harvey's safety with POs and/or need for instrumental swallow assessment, as well as to assess accurate completion of swallowing HEP.    Speech Therapy Frequency --   once every approx 4 weeks   Duration --   7 total visits   Treatment/Interventions Aspiration precaution training;Pharyngeal strengthening exercises;Diet toleration management by SLP;Trials of upgraded texture/liquids;Internal/external aids;Patient/family education;Compensatory strategies;SLP instruction and feedback    Potential to Achieve Goals Good    SLP Home Exercise Plan provided today    Consulted and Agree with Plan of Care Patient             Patient will benefit from skilled therapeutic intervention in order to improve the following deficits and impairments:   Dysphagia, unspecified type    Problem  List Patient Active Problem List   Diagnosis Date Noted   Dehydration 10/27/2020   Hypotension 10/27/2020   Diarrhea 10/27/2020   Port-A-Cath in place 07/13/2020   Counseling regarding advance care planning and goals of care 06/29/2020   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 [place], [place], [address] Phone: [phone]   Fax:  [phone]   Name: Jose Harvey MRN: [id number] Date of Birth: 1960-08-11

## 2020-12-08 NOTE — Telephone Encounter (Signed)
Cancelled appts per 7/6 sch msg. Pt stated that he was supposed to have  a PET scan, but it was denied. He said once he's able to get the scan r/s he will call back to r/s his other appts.

## 2020-12-09 ENCOUNTER — Ambulatory Visit (HOSPITAL_COMMUNITY): Payer: BC Managed Care – PPO

## 2020-12-09 ENCOUNTER — Other Ambulatory Visit: Payer: Self-pay

## 2020-12-09 DIAGNOSIS — C01 Malignant neoplasm of base of tongue: Secondary | ICD-10-CM

## 2020-12-10 ENCOUNTER — Telehealth: Payer: Self-pay | Admitting: Dietician

## 2020-12-10 ENCOUNTER — Inpatient Hospital Stay: Payer: BC Managed Care – PPO

## 2020-12-10 ENCOUNTER — Inpatient Hospital Stay: Payer: BC Managed Care – PPO | Admitting: Hematology

## 2020-12-10 ENCOUNTER — Ambulatory Visit: Payer: BC Managed Care – PPO | Admitting: Radiation Oncology

## 2020-12-10 ENCOUNTER — Other Ambulatory Visit: Payer: Self-pay

## 2020-12-10 ENCOUNTER — Inpatient Hospital Stay: Payer: BC Managed Care – PPO | Attending: Hematology | Admitting: Dietician

## 2020-12-10 ENCOUNTER — Inpatient Hospital Stay: Payer: BC Managed Care – PPO | Admitting: Dietician

## 2020-12-10 NOTE — Telephone Encounter (Signed)
Nutrition Follow-up:   Patient has completed concurrent chemoradiation for tongue cancer.   Spoke with patient via telephone. He reports he has noticed improvement in swallowing over the past couple of weeks, and continues to try new foods. Patient reports he has been unable to eat hamburgers or steaks, states they become too dry after chewing and is unable to swallow them. Patient reports he will start completing all 5 SLP daily exercises as instructed by ST, says he has only been doing 3 consistently. Patient reports tolerating soft, smooth textured foods. He has been eating chicken noodle soup, vegetables, ice cream, cupcakes, grits, pimento cheese. He is drinking Fairlife shakes daily (150 kcal, 30 g protein). Patient reports he has decreased tube feedings, giving 1-2 Glucerna 1.5 cartons/day. Patient reports his weights have been stable, recalls 163 lb at endocrinologist office a couple days ago. He would like to have tube removed.    Medications: reviewed  Labs: reviewed  Anthropometrics: Weight 163 lb per pt at recent office visit  165 lb 1.6 oz on 5/24 168 lb 11.2 oz on 4/27    Estimated Energy Needs  Kcals: 0174-9449 Protein: 125-145 Fluid: 2.5 L  NUTRITION DIAGNOSIS: Inadequate energy intake stable   INTERVENTION:  Encouraged intake of soft, moist high protein foods, reviewed strategies for altering textures Discussed adequate oral intake of calories and protein for weight maintenance, pt agrees to food journal Continue Glucerna 1.5 - 2 cartons/day with 60 ml  water before and after bolus feeding  Continue drinking Fairlife Shake for added calories and protein Continue baking/soda salt water rinses several times/day Reinforced importance of completing all recommended SLP exercises as prescribed by ST     MONITORING, EVALUATION, GOAL: weight trends, intake, tube feeding   NEXT VISIT: To be scheduled ~3-4 weeks

## 2020-12-16 ENCOUNTER — Other Ambulatory Visit: Payer: Self-pay

## 2020-12-16 DIAGNOSIS — C01 Malignant neoplasm of base of tongue: Secondary | ICD-10-CM

## 2020-12-16 MED ORDER — LIDOCAINE VISCOUS HCL 2 % MT SOLN: OROMUCOSAL | 4 refills | Status: DC

## 2020-12-17 ENCOUNTER — Telehealth: Payer: Self-pay | Admitting: *Deleted

## 2020-12-17 NOTE — Telephone Encounter (Signed)
CALLED PATIENT TO INFORM OF PET SCAN FOR 01-06-21- ARRIVAL TIME- 7:30 AM @ [facility], PATIENT TO BE NPO- MIDNIGHT DAY BEFORE TEST, AND PATIENT TO HOLD,MORNING DOSE OF DIABETIC MEDS FOR TEST, PATIENT VERIFIED UNDERSTANDING THIS, I ALSO TOLD THE PATIENT THAT I WOULD SPEAK WITH DR. Isidore Moos ABOUT WHEN TO BRING HIM BACK FOR HIS RESULTS, PATIENT VERIFIED UNDERSTANDING THIS

## 2020-12-20 ENCOUNTER — Telehealth: Payer: Self-pay | Admitting: *Deleted

## 2020-12-20 NOTE — Telephone Encounter (Signed)
Called patient to inform that his fu appt. with Dr. Isidore Moos has been moved to 01-14-21 @ 11:40 am, spoke with patient and he is aware of this change and is good with the new appt. date and time.

## 2020-12-21 ENCOUNTER — Telehealth: Payer: Self-pay | Admitting: Hematology

## 2020-12-21 NOTE — Telephone Encounter (Signed)
Scheduled appt per 7/18 sch msg. Pt aware.

## 2020-12-23 ENCOUNTER — Ambulatory Visit: Payer: BC Managed Care – PPO | Attending: Internal Medicine | Admitting: Physical Therapy

## 2020-12-23 ENCOUNTER — Other Ambulatory Visit: Payer: Self-pay

## 2020-12-23 ENCOUNTER — Encounter: Payer: Self-pay | Admitting: Physical Therapy

## 2020-12-23 DIAGNOSIS — I890 Lymphedema, not elsewhere classified: Secondary | ICD-10-CM

## 2020-12-23 DIAGNOSIS — R293 Abnormal posture: Secondary | ICD-10-CM | POA: Diagnosis not present

## 2020-12-23 DIAGNOSIS — C01 Malignant neoplasm of base of tongue: Secondary | ICD-10-CM

## 2020-12-23 NOTE — Therapy (Signed)
[place], [place], [address] Phone: [phone]   Fax:  [phone]  Physical Therapy Evaluation  Patient Details  Name: Jose Harvey MRN: [id number] Date of Birth: Jan 02, 1961 Referring Provider (PT): Isidore Moos   Encounter Date: 12/23/2020   PT End of Session - 12/23/20 1245     Visit Number 1    Number of Visits 2    Date for PT Re-Evaluation 02/03/21   will begin in 2 weeks due to pt work schedule   PT Start Time 1203    PT Stop Time 62    PT Time Calculation (min) 37 min    Activity Tolerance Patient tolerated treatment well    Behavior During Therapy [facility] for tasks assessed/performed             Past Medical History:  Diagnosis Date   Adrenal adenoma, left 03/2017   noted on CT   Allergic rhinitis, cause unspecified    Anxiety state, unspecified    Aortic atherosclerosis ([place]) 03/2017   noted on CT   BPV (benign positional vertigo)    COPD (chronic obstructive pulmonary disease) with emphysema ([place])    mild followed by [facility]    Coronary artery disease    Depressive disorder, not elsewhere classified    Dyspnea    history of   ED (erectile dysfunction)    Gallbladder polyp 03/2016   noted on CT   Hepatic hemangioma 03/2017   noted on CT   History of kidney stones    hx of years ago    Indigestion    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)    Mixed hyperlipidemia    Prostate cancer ([place])    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, not stated as uncontrolled(250.51)    Type I (juvenile type) diabetes mellitus without mention of complication, not stated as uncontrolled    type I - followed by Dr Buddy Duty    Vesicoureteral reflux     Past Surgical History:  Procedure Laterality Date   CHOLECYSTECTOMY N/A 05/17/2016   Procedure: LAPAROSCOPIC CHOLECYSTECTOMY;  Surgeon: Clovis Riley, MD;  Location: [facility];  Service:  General;  Laterality: N/A;   cyst removed from throat      dental surgeries      IR GASTROSTOMY TUBE MOD SED  07/07/2020   IR IMAGING GUIDED PORT INSERTION  07/07/2020   LYMPHADENECTOMY Bilateral 07/02/2017   Procedure: BILATERAL LYMPHADENECTOMY;  Surgeon: Lucas Mallow, MD;  Location: [facility];  Service: Urology;  Laterality: Bilateral;   PROSTATE BIOPSY     ROBOT ASSISTED LAPAROSCOPIC RADICAL PROSTATECTOMY N/A 07/02/2017   Procedure: XI ROBOTIC ASSISTED LAPAROSCOPIC RADICAL PROSTATECTOMY;  Surgeon: Lucas Mallow, MD;  Location: [facility];  Service: Urology;  Laterality: N/A;   TONSILLECTOMY     VIDEO ASSISTED THORACOSCOPY (VATS)/ LOBECTOMY Right 05/08/2019   Procedure: Right VIDEO ASSISTED THORACOSCOPY with   MIDDLE LOBECTOMY and Enbloc portion of Upper Lobe with Node dissection, Intercostal Nerve Block;  Surgeon: Melrose Nakayama, MD;  Location: [facility];  Service: Thoracic;  Laterality: Right;   WISDOM TOOTH EXTRACTION      There were no vitals filed for this visit.    Subjective Assessment - 12/23/20 1205     Subjective I started getting a little bit of swelling so I wanted you to check it out. I started noticing my swelling a month and a half after I stopped radiation.    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 No/denies    Pain Score 0-No pain                [facility] PT Assessment - 12/23/20 0001       Assessment    Medical Diagnosis SCC of tongue    Referring Provider (PT) Squire    Onset Date/Surgical Date 05/03/20    Hand Dominance Right    Prior Therapy none      Precautions   Precautions Other (comment)    Precaution Comments lymphedema      Restrictions   Weight Bearing Restrictions No      Balance Screen   Has the patient fallen in the past 6 months No    Has the patient had a decrease in activity level because of a fear of falling?  No    Is the patient reluctant to leave their home because of a fear of falling?  No      Home Environment   Living Environment Private residence    Living Arrangements Alone    Available Help at Discharge Family    Type of [place]      Prior Function   Level of [place] Retired    Leisure pt does not currently exercise      Cognition   Overall Cognitive Status Within Functional Limits for tasks assessed      Observation/Other Assessments   Observations some fullness at anterior neck      Functional Tests   Functional tests --      Sit to Stand   Comments --      Posture/Postural Control   Posture/Postural Control Postural limitations    Postural Limitations Rounded Shoulders;Forward head      AROM   Overall AROM Comments shoulder ROM WFL    Cervical Flexion WFL    Cervical Extension WFL    Cervical - Right Side Bend WFL    Cervical - Left Side Bend WFL    Cervical - Right Rotation WFL    Cervical - Left Rotation [facility]      Ambulation/Gait   Ambulation/Gait Yes    Ambulation/Gait Assistance 7: Independent    Ambulation Distance (Feet) 10 Feet    Gait Pattern Within Functional Limits               LYMPHEDEMA/ONCOLOGY QUESTIONNAIRE - 12/23/20 0001       Head and Neck   4 cm superior to sternal notch around neck 42.2 cm    6 cm superior to sternal notch around neck 41.5 cm    8 cm superior to sternal notch around neck 42.5 cm                     Objective measurements  completed on examination: See above findings.       [facility] Adult PT Treatment/Exercise - 12/23/20 0001  Manual Therapy   Manual Therapy Manual Lymphatic Drainage (MLD);Edema management    Edema Management created foam chip pack for pt to wear around head and neck to reduce anterior neck swelling - educated pt to wear as much as possible, also educated pt not to tie it too tightly    Manual Lymphatic Drainage (MLD) in supine with head of bed elevated: short neck, 5 diaphragmatic breaths, bilateral axillary nodes, bilateral pectoral nodes, anterior chest, short neck, posterior, lateral and anterior neck moving fluid towards pathways then retracing all steps while verbally instructing pt in correct skin stretch technique and sequence                         PT Long Term Goals - 12/23/20 1250       PT LONG TERM GOAL #1   Title Pt will be independent in self MLD for long term management of lymphedema.    Time 4    Period Weeks    Status New    Target Date 01/20/21      PT LONG TERM GOAL #2   Title Pt will obtain appropriate compression garments for long term management of lymphedema.    Time 4    Period Weeks    Status New    Target Date 01/20/21      PT LONG TERM GOAL #3   Title Pt will no longer demonstrate fibrosis of anterior neck to decrease risk of infection.    Time 4    Period Weeks    Status New    Target Date 01/20/21                    Plan - 12/23/20 1246     Clinical Impression Statement Pt presents to PT with recent onset of neck lymphedema that began about a month and a half ago after completing treatment for squamous cell carcinoma of tongue. Pt's neck circumferences have increased by 2-3 cm since that time. The area is fibrotic. Created foam chip pack for pt to begin to wear to help reduce edema. Started MLD and educated pt in correct technique and sequence. Will issue handout at next session and have pt return demonstrate. Pt will  benefit from skilled PT services to decrease anterior neck swelling, assist pt with obtaining appropriate compression garments and ensure that pt can independently manage his lymphedema.    Comorbidities previous hx of prostate cancer, non Hodgkins lymphoma, diabetes    PT Frequency 2x / week    PT Duration 4 weeks    PT Treatment/Interventions ADLs/Self Care Home Management;Patient/family education;Therapeutic exercise;Manual techniques;Manual lymph drainage;Compression bandaging;Taping;Vasopneumatic Device    PT Next Visit Plan continue MLD to neck and issue handout and have pt return demonstrate, give pt info on obtaining solaris head and neck garment    PT Home Exercise Plan wear chip pack    Consulted and Agree with Plan of Care Patient             Patient will benefit from skilled therapeutic intervention in order to improve the following deficits and impairments:  Postural dysfunction, Decreased knowledge of precautions, Increased edema  Visit Diagnosis: Lymphedema, not elsewhere classified  Abnormal posture  Malignant neoplasm of base of tongue ([place])     Problem List Patient Active Problem List   Diagnosis Date Noted   Dehydration 10/27/2020   Hypotension 10/27/2020   Diarrhea 10/27/2020   Port-A-Cath in place 07/13/2020   Counseling regarding  advance care planning and goals of care 06/29/2020   Cancer of base of tongue ([place]) 06/11/2020   S/P thoracotomy 05/08/2019   Prostate cancer ([place]) 07/02/2017   Malignant neoplasm of prostate ([place]) 06/06/2017   DOE (dyspnea on exertion) 07/28/2014   COPD (chronic obstructive pulmonary disease) ([place]) 01/30/2014   Throat fullness 01/30/2014   Paroxysmal nocturnal dyspnea 11/17/2013   Hyperlipidemia 11/17/2013   Insulin dependent diabetes mellitus 11/17/2013   Essential hypertension 11/17/2013    Allyson Sabal [facility] 12/23/2020, 12:53 PM  [address] [address], [place], [address] Phone: [phone]   Fax:  [phone]  Name: Jose Harvey MRN: [id number] Date of Birth: 08/20/1960  Manus Gunning, PT 12/23/20 12:53 PM

## 2020-12-29 ENCOUNTER — Ambulatory Visit: Payer: BC Managed Care – PPO

## 2021-01-05 ENCOUNTER — Ambulatory Visit: Payer: BC Managed Care – PPO

## 2021-01-06 ENCOUNTER — Encounter (HOSPITAL_COMMUNITY): Payer: Self-pay

## 2021-01-06 ENCOUNTER — Encounter (HOSPITAL_COMMUNITY): Payer: BC Managed Care – PPO

## 2021-01-10 ENCOUNTER — Ambulatory Visit: Payer: Self-pay | Admitting: Radiation Oncology

## 2021-01-10 ENCOUNTER — Encounter: Payer: Self-pay | Admitting: Physical Therapy

## 2021-01-10 ENCOUNTER — Other Ambulatory Visit: Payer: Self-pay

## 2021-01-10 ENCOUNTER — Ambulatory Visit: Payer: BC Managed Care – PPO | Attending: Internal Medicine | Admitting: Physical Therapy

## 2021-01-10 DIAGNOSIS — R293 Abnormal posture: Secondary | ICD-10-CM | POA: Diagnosis not present

## 2021-01-10 DIAGNOSIS — I890 Lymphedema, not elsewhere classified: Secondary | ICD-10-CM

## 2021-01-10 DIAGNOSIS — C01 Malignant neoplasm of base of tongue: Secondary | ICD-10-CM

## 2021-01-10 NOTE — Therapy (Signed)
[place], [place], [address] Phone: [phone]   Fax:  [phone]  Physical Therapy Treatment  Patient Details  Name: Jose Harvey MRN: [id number] Date of Birth: September 15, 1960 Referring Provider (PT): Isidore Moos   Encounter Date: 01/10/2021   PT End of Session - 01/10/21 0957     Visit Number 2    Number of Visits 9    Date for PT Re-Evaluation 02/03/21    PT Start Time 0906    PT Stop Time 0956    PT Time Calculation (min) 50 min    Activity Tolerance Patient tolerated treatment well    Behavior During Therapy [facility] for tasks assessed/performed             Past Medical History:  Diagnosis Date   Adrenal adenoma, left 03/2017   noted on CT   Allergic rhinitis, cause unspecified    Anxiety state, unspecified    Aortic atherosclerosis ([place]) 03/2017   noted on CT   BPV (benign positional vertigo)    COPD (chronic obstructive pulmonary disease) with emphysema ([place])    mild followed by [facility]    Coronary artery disease    Depressive disorder, not elsewhere classified    Dyspnea    history of   ED (erectile dysfunction)    Gallbladder polyp 03/2016   noted on CT   Hepatic hemangioma 03/2017   noted on CT   History of kidney stones    hx of years ago    Indigestion    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)    Mixed hyperlipidemia    Prostate cancer ([place])    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, not stated as uncontrolled(250.51)    Type I (juvenile type) diabetes mellitus without mention of complication, not stated as uncontrolled    type I - followed by Dr Buddy Duty    Vesicoureteral reflux     Past Surgical History:  Procedure Laterality Date   CHOLECYSTECTOMY N/A 05/17/2016   Procedure: LAPAROSCOPIC CHOLECYSTECTOMY;  Surgeon: Clovis Riley, MD;  [facility];  Service: General;  Laterality: N/A;   cyst removed from throat       dental surgeries      IR GASTROSTOMY TUBE MOD SED  07/07/2020   IR IMAGING GUIDED PORT INSERTION  07/07/2020   LYMPHADENECTOMY Bilateral 07/02/2017   Procedure: BILATERAL LYMPHADENECTOMY;  Surgeon: Lucas Mallow, MD;  [facility];  Service: Urology;  Laterality: Bilateral;   PROSTATE BIOPSY     ROBOT ASSISTED LAPAROSCOPIC RADICAL PROSTATECTOMY N/A 07/02/2017   Procedure: XI ROBOTIC ASSISTED LAPAROSCOPIC RADICAL PROSTATECTOMY;  Surgeon: Lucas Mallow, MD;  [facility];  Service: Urology;  Laterality: N/A;   TONSILLECTOMY     VIDEO ASSISTED THORACOSCOPY (VATS)/ LOBECTOMY Right 05/08/2019   Procedure: Right VIDEO ASSISTED THORACOSCOPY with   MIDDLE LOBECTOMY and Enbloc portion of Upper Lobe with Node dissection, Intercostal Nerve Block;  Surgeon: Melrose Nakayama, MD;  Location: [facility];  Service: Thoracic;  Laterality: Right;   WISDOM TOOTH EXTRACTION      There were no vitals filed for this visit.   Subjective Assessment - 01/10/21 0909     Subjective I was surprised after last session when I got in my car and I could see the difference. I have been wearing the chip pack.    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 No/denies    Pain Score 0-No pain                               [facility] Adult PT Treatment/Exercise - 01/10/21 0001       Manual Therapy   Edema Management issued info on  obtaining solaris head and neck strap    Manual Lymphatic Drainage (MLD) while instructing pt and having pt follow along using handout: in supine with head of bed elevated: short neck, 5 diaphragmatic breaths, bilateral axillary nodes, bilateral pectoral nodes, anterior chest, short neck, posterior, lateral and anterior neck moving fluid towards pathways then retracing all steps while verbally instructing pt in correct skin stretch technique and sequence                         PT Long Term Goals - 12/23/20 1250       PT LONG TERM GOAL #1   Title Pt will be independent in self MLD for long term management of lymphedema.    Time 4    Period Weeks    Status New    Target Date 01/20/21      PT LONG TERM GOAL #2   Title Pt will obtain appropriate compression garments for long term management of lymphedema.    Time 4    Period Weeks    Status New    Target Date 01/20/21      PT LONG TERM GOAL #3   Title Pt will no longer demonstrate fibrosis of anterior neck to decrease risk of infection.    Time 4    Period Weeks    Status New    Target Date 01/20/21                   Plan - 01/10/21 0958     Clinical Impression Statement Issued handout to pt and continued to instruct pt in self massage technique for head and neck. Pt has been wearing his chip pack and has noticed a decrease in his swelling. He still has fibrosis present in anterior neck but overall the swelling is improving. Educated pt to practice self MLD over the next few days and therapist will assess his independence at next session. Issued info on how to obtain Solaris head and neck garment for long term management of lymphedema.    PT Frequency 2x / week    PT Duration 4 weeks    PT Treatment/Interventions ADLs/Self Care Home Management;Patient/family education;Therapeutic exercise;Manual techniques;Manual lymph drainage;Compression bandaging;Taping;Vasopneumatic Device    PT Next Visit Plan continue  MLD to neck and have pt return demonstrate, give pt info on obtaining solaris head and neck garment    PT Home Exercise Plan wear chip pack, self MLD    Consulted and Agree with Plan of Care Patient             Patient will benefit from skilled  therapeutic intervention in order to improve the following deficits and impairments:  Postural dysfunction, Decreased knowledge of precautions, Increased edema  Visit Diagnosis: Lymphedema, not elsewhere classified  Abnormal posture  Malignant neoplasm of base of tongue ([place])     Problem List Patient Active Problem List   Diagnosis Date Noted   Dehydration 10/27/2020   Hypotension 10/27/2020   Diarrhea 10/27/2020   Port-A-Cath in place 07/13/2020   Counseling regarding advance care planning and goals of care 06/29/2020   Cancer of base of tongue ([place]) 06/11/2020   S/P thoracotomy 05/08/2019   Prostate cancer ([place]) 07/02/2017   Malignant neoplasm of prostate ([place]) 06/06/2017   DOE (dyspnea on exertion) 07/28/2014   COPD (chronic obstructive pulmonary disease) ([place]) 01/30/2014   Throat fullness 01/30/2014   Paroxysmal nocturnal dyspnea 11/17/2013   Hyperlipidemia 11/17/2013   Insulin dependent diabetes mellitus 11/17/2013   Essential hypertension 11/17/2013    Allyson Sabal [facility] 01/10/2021, 10:01 AM  [place], [place], [address] Phone: [phone]   Fax:  [phone]  Name: Jose Harvey MRN: [id number] Date of Birth: 15-May-1961   Manus Gunning, PT 01/10/21 10:01 AM

## 2021-01-11 ENCOUNTER — Ambulatory Visit (HOSPITAL_COMMUNITY)
Admission: RE | Admit: 2021-01-11 | Discharge: 2021-01-11 | Disposition: A | Discharge status: Home / Self Care | Payer: BC Managed Care – PPO | Source: Ambulatory Visit | Attending: Radiation Oncology | Admitting: Radiation Oncology

## 2021-01-11 ENCOUNTER — Ambulatory Visit: Payer: BC Managed Care – PPO | Attending: Radiation Oncology

## 2021-01-11 ENCOUNTER — Telehealth: Payer: Self-pay | Admitting: Hematology

## 2021-01-11 DIAGNOSIS — C01 Malignant neoplasm of base of tongue: Secondary | ICD-10-CM | POA: Diagnosis not present

## 2021-01-11 DIAGNOSIS — R1310 Dysphagia, unspecified: Secondary | ICD-10-CM | POA: Insufficient documentation

## 2021-01-11 DIAGNOSIS — C760 Malignant neoplasm of head, face and neck: Secondary | ICD-10-CM | POA: Diagnosis not present

## 2021-01-11 LAB — GLUCOSE, CAPILLARY: Glucose-Capillary: 237 mg/dL — ABNORMAL HIGH (ref 70–99)

## 2021-01-11 MED ORDER — FLUDEOXYGLUCOSE F - 18 (FDG) INJECTION
10.0000 | Freq: Once | INTRAVENOUS | Status: AC | PRN
  Administered 2021-01-11: 8.25 via INTRAVENOUS

## 2021-01-11 NOTE — Telephone Encounter (Signed)
Rescheduled upcoming appointment due to provider's emergency. Patient is aware of changes. 

## 2021-01-11 NOTE — Therapy (Signed)
[facility] [address] [place], [place], [address] Phone: [phone]   Fax:  [phone]  Speech Language Pathology Treatment/renewal-discharge summary   Patient Details  Name: Jose Harvey MRN: [id number] Date of Birth: Oct 22, 1960 Referring Provider (SLP): Eppie Gibson, MD   Encounter Date: 01/11/2021   End of Session - 01/11/21 1225     Visit Number 7    Number of Visits 7    Date for SLP Re-Evaluation 01/11/21    SLP Start Time 1149    SLP Stop Time  1227    SLP Time Calculation (min) 38 min    Activity Tolerance Patient tolerated treatment well             Past Medical History:  Diagnosis Date   Adrenal adenoma, left 03/2017   noted on CT   Allergic rhinitis, cause unspecified    Anxiety state, unspecified    Aortic atherosclerosis ([place]) 03/2017   noted on CT   BPV (benign positional vertigo)    COPD (chronic obstructive pulmonary disease) with emphysema ([place])    mild followed by [facility]    Coronary artery disease    Depressive disorder, not elsewhere classified    Dyspnea    history of   ED (erectile dysfunction)    Gallbladder polyp 03/2016   noted on CT   Hepatic hemangioma 03/2017   noted on CT   History of kidney stones    hx of years ago    Indigestion    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)    Mixed hyperlipidemia    Prostate cancer ([place])    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, not stated as uncontrolled(250.51)    Type I (juvenile type) diabetes mellitus without mention of complication, not stated as uncontrolled    type I - followed by Dr Buddy Duty    Vesicoureteral reflux     Past Surgical History:  Procedure Laterality Date   CHOLECYSTECTOMY N/A 05/17/2016   Procedure: LAPAROSCOPIC CHOLECYSTECTOMY;  Surgeon: Clovis Riley, MD;  Location: [facility];  Service: General;  Laterality: N/A;   cyst removed from throat       dental surgeries      IR GASTROSTOMY TUBE MOD SED  07/07/2020   IR IMAGING GUIDED PORT INSERTION  07/07/2020   LYMPHADENECTOMY Bilateral 07/02/2017   Procedure: BILATERAL LYMPHADENECTOMY;  Surgeon: Lucas Mallow, MD;  Location: [facility];  Service: Urology;  Laterality: Bilateral;   PROSTATE BIOPSY     ROBOT ASSISTED LAPAROSCOPIC RADICAL PROSTATECTOMY N/A 07/02/2017   Procedure: XI ROBOTIC ASSISTED LAPAROSCOPIC RADICAL PROSTATECTOMY;  Surgeon: Lucas Mallow, MD;  Location: [facility];  Service: Urology;  Laterality: N/A;   TONSILLECTOMY     VIDEO ASSISTED THORACOSCOPY (VATS)/ LOBECTOMY Right 05/08/2019   Procedure: Right VIDEO ASSISTED THORACOSCOPY with   MIDDLE LOBECTOMY and Enbloc portion of Upper Lobe with Node dissection, Intercostal Nerve Block;  Surgeon: Melrose Nakayama, MD;  Location: [facility];  Service: Thoracic;  Laterality: Right;   WISDOM TOOTH EXTRACTION      There were no vitals filed for this visit.  SPEECH THERAPY RENWAL-DISCHARGE SUMMARY  Visits from Start of Care: 7  Current functional level related to goals / functional outcomes: See below. Pt modified independent with HEP, and is eating regular diet/thin liquids without overt s/sx aspiration and other oral pharygneal difficulties. Pt's LTGs will remain enforce through today's session.    Remaining deficits: None.    Education /  Equipment: Late effects head/neck radiation on swallowing ability, HEP procedure, food journal   Patient agrees to discharge. Patient goals were partially met. Patient is being discharged due to meeting the stated rehab goals..      Subjective Assessment - 01/11/21 1153     Subjective Pt has been back to work last two weeks adn so HEP has not been as consistent.    Currently in Pain? No/denies                   ADULT SLP TREATMENT - 01/11/21 1154       General Information   Behavior/Cognition Alert;Cooperative;Pleasant mood      Treatment Provided   Treatment  provided Dysphagia      Dysphagia Treatment   Temperature Spikes Noted No    Respiratory Status Room air    Treatment Methods Skilled observation;Compensation strategy training;Patient/caregiver education;Therapeutic exercise    Patient observed directly with PO's Yes    Type of PO's observed Regular;Thin liquids    Oral Phase Signs & Symptoms Other (comment)   none noted today in session   Pharyngeal Phase Signs & Symptoms Other (comment)   none noted today in session   Other treatment/comments Pt keeping track of food- primarily soft food but some crunchy foods like crackers and cookies noted. SLP had to tell pt rationale for HEP - SLP strongly reiterated pt perform exercises BID until THANKSGIVING, then three times a week. Pt performed HEP with independence - stated hasn't been doing supersupraglottic and so SLP encouraged pt to complete that as directed.      Assessment / Recommendations / Plan   Plan Continue with current plan of care                SLP Short Term Goals - 10/07/20 1652       SLP SHORT TERM GOAL #1   Title Pt will complete HEP with rare min A over two sessions    Baseline 09-09-20    Period --   visits, for all STGs   Status Achieved      SLP SHORT TERM GOAL #2   Title Pt will tell SLP rationale for HEP completion over two sessions    Baseline 09-09-20    Status Achieved      SLP SHORT TERM GOAL #3   Title Pt will tell SLP how a food journal can hasten/facilitate return to more normalized diet    Status Deferred              SLP Long Term Goals - 01/11/21 1215       SLP LONG TERM GOAL #1   Title Pt will complete HEP with modified independence over 3 visits    Baseline 10-07-20, 11-10-20    Status Partially Met   and ongoing     SLP LONG TERM GOAL #2   Title Pt will tell SLP 3 overt s/s aspiration PNA with modified independence    Status Achieved      SLP LONG TERM GOAL #3   Title Pt will tell SLP when to decr frequency of HEP    Time 1     Status Achieved      SLP LONG TERM GOAL #4   Title pt will indicate how a food journal can facilitate faster return to pre-rad diet    Time 1    Status Achieved      SLP LONG TERM GOAL #5   Title pt will follow precuations (  head turn rt, small bites, effortful swallow) with POs in session, x2 sessions    Time 1    Status Deferred              Plan - 01/11/21 [place] presents today with at least WFL/WNL  swallowing ability with regular diet (peanut butter crackers) and thin liquids. See "other comments" for details. SLP reviewed [name]' indivdualized exercise program to mitigate/eliminate pt's muscle fibrosis following and as a result of, pt's rad tx. Pt stated he was better with HEP completion until last three weeks and now has suboptimal completion of HEP. Today he again completes the exercises with modified independence. ST will d/c at this time due to WNL/WFL swallow function and modified indepndence with HEP. He was told to f/u with rad onc MD or his ENT should he have swallowing difficulties in  the future.    Treatment/Interventions Aspiration precaution training;Pharyngeal strengthening exercises;Diet toleration management by SLP;Trials of upgraded texture/liquids;Internal/external aids;Patient/family education;Compensatory strategies;SLP instruction and feedback    Potential to Achieve Goals Good    SLP Home Exercise Plan provided today    Consulted and Agree with Plan of Care Patient             Patient will benefit from skilled therapeutic intervention in order to improve the following deficits and impairments:   Dysphagia, unspecified type    Problem List Patient Active Problem List   Diagnosis Date Noted   Dehydration 10/27/2020   Hypotension 10/27/2020   Diarrhea 10/27/2020   Port-A-Cath in place 07/13/2020   Counseling regarding advance care planning and goals of care 06/29/2020   Cancer of base of tongue ([place]) 06/11/2020    S/P thoracotomy 05/08/2019   Prostate cancer ([place]) 07/02/2017   Malignant neoplasm of prostate ([place]) 06/06/2017   DOE (dyspnea on exertion) 07/28/2014   COPD (chronic obstructive pulmonary disease) ([place]) 01/30/2014   Throat fullness 01/30/2014   Paroxysmal nocturnal dyspnea 11/17/2013   Hyperlipidemia 11/17/2013   Insulin dependent diabetes mellitus 11/17/2013   Essential hypertension 11/17/2013    Eyob Godlewski. ,[place], [place]  01/11/2021, 12:29 PM  [facility] [address] [place], [place], [address] Phone: [phone]   Fax:  [phone]   Name: Jose Harvey MRN: [id number] Date of Birth: 25-Aug-1960

## 2021-01-13 ENCOUNTER — Ambulatory Visit: Payer: BC Managed Care – PPO | Admitting: Physical Therapy

## 2021-01-13 ENCOUNTER — Encounter: Payer: Self-pay | Admitting: Physical Therapy

## 2021-01-13 ENCOUNTER — Ambulatory Visit: Payer: BC Managed Care – PPO | Admitting: Hematology

## 2021-01-13 DIAGNOSIS — I890 Lymphedema, not elsewhere classified: Secondary | ICD-10-CM | POA: Diagnosis not present

## 2021-01-13 DIAGNOSIS — R293 Abnormal posture: Secondary | ICD-10-CM | POA: Diagnosis not present

## 2021-01-13 DIAGNOSIS — C01 Malignant neoplasm of base of tongue: Secondary | ICD-10-CM | POA: Diagnosis not present

## 2021-01-13 NOTE — Therapy (Signed)
[facility] [place], [place], [address] Phone: [phone]   Fax:  [phone]  Physical Therapy Treatment  Patient Details  Name: Jose Harvey MRN: [id number] Date of Birth: [birth year] Referring Provider (PT): Isidore Moos   Encounter Date: 01/13/2021   PT End of Session - 01/13/21 1351     Visit Number 3    Number of Visits 9    Date for PT Re-Evaluation 02/03/21    PT Start Time [id number]    PT Stop Time [id number]    PT Time Calculation (min) 46 min    Activity Tolerance Patient tolerated treatment well    Behavior During Therapy [facility] for tasks assessed/performed             Past Medical History:  Diagnosis Date   Adrenal adenoma, left 03/2017   noted on CT   Allergic rhinitis, cause unspecified    Anxiety state, unspecified    Aortic atherosclerosis ([place]) 03/2017   noted on CT   BPV (benign positional vertigo)    COPD (chronic obstructive pulmonary disease) with emphysema ([place])    mild followed by [facility]    Coronary artery disease    Depressive disorder, not elsewhere classified    Dyspnea    history of   ED (erectile dysfunction)    Gallbladder polyp 03/2016   noted on CT   Hepatic hemangioma 03/2017   noted on CT   History of kidney stones    hx of years ago    Indigestion    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)    Mixed hyperlipidemia    Prostate cancer ([place])    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, not stated as uncontrolled(250.51)    Type I (juvenile type) diabetes mellitus without mention of complication, not stated as uncontrolled    type I - followed by Dr Buddy Duty    Vesicoureteral reflux     Past Surgical History:  Procedure Laterality Date   CHOLECYSTECTOMY N/A 05/17/2016   Procedure: LAPAROSCOPIC CHOLECYSTECTOMY;  Surgeon: Clovis Riley, MD;  [facility];  Service: General;  Laterality: N/A;   cyst removed from throat       dental surgeries      IR GASTROSTOMY TUBE MOD SED  07/07/2020   IR IMAGING GUIDED PORT INSERTION  07/07/2020   LYMPHADENECTOMY Bilateral 07/02/2017   Procedure: BILATERAL LYMPHADENECTOMY;  Surgeon: Lucas Mallow, MD;  [facility];  Service: Urology;  Laterality: Bilateral;   PROSTATE BIOPSY     ROBOT ASSISTED LAPAROSCOPIC RADICAL PROSTATECTOMY N/A 07/02/2017   Procedure: XI ROBOTIC ASSISTED LAPAROSCOPIC RADICAL PROSTATECTOMY;  Surgeon: Lucas Mallow, MD;  [facility];  Service: Urology;  Laterality: N/A;   TONSILLECTOMY     VIDEO ASSISTED THORACOSCOPY (VATS)/ LOBECTOMY Right 05/08/2019   Procedure: Right VIDEO ASSISTED THORACOSCOPY with   MIDDLE LOBECTOMY and Enbloc portion of Upper Lobe with Node dissection, Intercostal Nerve Block;  Surgeon: Melrose Nakayama, MD;  [facility];  Service: Thoracic;  Laterality: Right;   WISDOM TOOTH EXTRACTION      There were no vitals filed for this visit.   Subjective Assessment - 01/13/21 1303     Subjective I don't even really see my swelling much now. I ordered the thing to wear the other day and I should get it today.    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 No/denies    Pain Score 0-No pain                               [facility] Adult PT Treatment/Exercise - 01/13/21 0001       Manual Therapy   Manual Lymphatic Drainage (MLD) while  instructing pt had pt return demonstrate all steps: in sitting with head of bed elevated: short neck, 5 diaphragmatic breaths, bilateral axillary nodes, bilateral pectoral nodes, anterior chest, short neck, posterior, lateral and anterior neck moving fluid towards pathways then moved to supine where therapist continued work on anterior neck then retracing all steps                         PT Long Term Goals - 12/23/20 1250       PT LONG TERM GOAL #1   Title Pt will be independent in self MLD for long term management of lymphedema.    Time 4    Period Weeks    Status New    Target Date 01/20/21      PT LONG TERM GOAL #2   Title Pt will obtain appropriate compression garments for long term management of lymphedema.    Time 4    Period Weeks    Status New    Target Date 01/20/21      PT LONG TERM GOAL #3   Title Pt will no longer demonstrate fibrosis of anterior neck to decrease risk of infection.    Time 4    Period Weeks    Status New    Target Date 01/20/21                   Plan - 01/13/21 1353     Clinical Impression Statement Pt reports he ordered the solaris head and neck garment and it hoping it will arrive soon. Instructed pt in self MLD technique and had him return demonstrate. Pt only required min verbal cues to perform technique correctly. Pt demonstrated a good skin stretch and needed occasional cues to decrease pressure. If pt receives his garment will instruct pt in donning/doffing at next visit. Pt's lymphedema has responded well to treatment and has decreased significantly.    PT Frequency 2x / week    PT Duration 4 weeks    PT Treatment/Interventions ADLs/Self Care Home Management;Patient/family education;Therapeutic exercise;Manual techniques;Manual lymph drainage;Compression bandaging;Taping;Vasopneumatic Device    PT Next Visit Plan continue MLD to neck and have pt return demonstrate, see if pt received solaris head and neck garment    PT  Home Exercise Plan wear chip pack, self MLD    Consulted and Agree with Plan of Care Patient             Patient will benefit from skilled therapeutic intervention in order to improve the following deficits and impairments:  Postural dysfunction, Decreased knowledge of precautions, Increased edema  Visit Diagnosis: Lymphedema, not  elsewhere classified  Abnormal posture  Malignant neoplasm of base of tongue [facility])     Problem List Patient Active Problem List   Diagnosis Date Noted   Dehydration 10/27/2020   Hypotension 10/27/2020   Diarrhea 10/27/2020   Port-A-Cath in place 07/13/2020   Counseling regarding advance care planning and goals of care 06/29/2020   Cancer of base of tongue ([place]) 06/11/2020   S/P thoracotomy 05/08/2019   Prostate cancer ([place]) 07/02/2017   Malignant neoplasm of prostate ([place]) 06/06/2017   DOE (dyspnea on exertion) 07/28/2014   COPD (chronic obstructive pulmonary disease) ([place]) 01/30/2014   Throat fullness 01/30/2014   Paroxysmal nocturnal dyspnea 11/17/2013   Hyperlipidemia 11/17/2013   Insulin dependent diabetes mellitus 11/17/2013   Essential hypertension 11/17/2013    Allyson Sabal [facility] 01/13/2021, 1:57 PM  [place], [place], [address] Phone: [phone]   Fax:  [phone]  Name: Jose Harvey MRN: [id number] Date of Birth: [birth year]  Manus Gunning, PT 01/13/21 1:58 PM

## 2021-01-14 ENCOUNTER — Inpatient Hospital Stay: Payer: BC Managed Care – PPO

## 2021-01-14 ENCOUNTER — Ambulatory Visit
Admission: RE | Admit: 2021-01-14 | Discharge: 2021-01-14 | Disposition: A | Discharge status: Home / Self Care | Payer: BC Managed Care – PPO | Source: Ambulatory Visit | Attending: Radiation Oncology | Admitting: Radiation Oncology

## 2021-01-14 ENCOUNTER — Other Ambulatory Visit: Payer: Self-pay

## 2021-01-14 ENCOUNTER — Inpatient Hospital Stay: Payer: BC Managed Care – PPO | Attending: Hematology and Oncology | Admitting: Hematology and Oncology

## 2021-01-14 VITALS — BP 113/68 | HR 75 | Temp 97.9°F | Resp 18 | Ht 71.0 in | Wt 158.6 lb

## 2021-01-14 DIAGNOSIS — R918 Other nonspecific abnormal finding of lung field: Secondary | ICD-10-CM | POA: Diagnosis not present

## 2021-01-14 DIAGNOSIS — Z9079 Acquired absence of other genital organ(s): Secondary | ICD-10-CM | POA: Insufficient documentation

## 2021-01-14 DIAGNOSIS — Z08 Encounter for follow-up examination after completed treatment for malignant neoplasm: Secondary | ICD-10-CM | POA: Diagnosis not present

## 2021-01-14 DIAGNOSIS — Z79899 Other long term (current) drug therapy: Secondary | ICD-10-CM | POA: Insufficient documentation

## 2021-01-14 DIAGNOSIS — Z7289 Other problems related to lifestyle: Secondary | ICD-10-CM | POA: Diagnosis not present

## 2021-01-14 DIAGNOSIS — Z86018 Personal history of other benign neoplasm: Secondary | ICD-10-CM | POA: Diagnosis not present

## 2021-01-14 DIAGNOSIS — Z836 Family history of other diseases of the respiratory system: Secondary | ICD-10-CM | POA: Insufficient documentation

## 2021-01-14 DIAGNOSIS — C884 Extranodal marginal zone B-cell lymphoma of mucosa-associated lymphoid tissue [MALT-lymphoma]: Secondary | ICD-10-CM | POA: Diagnosis not present

## 2021-01-14 DIAGNOSIS — C01 Malignant neoplasm of base of tongue: Secondary | ICD-10-CM

## 2021-01-14 DIAGNOSIS — R197 Diarrhea, unspecified: Secondary | ICD-10-CM | POA: Insufficient documentation

## 2021-01-14 DIAGNOSIS — C770 Secondary and unspecified malignant neoplasm of lymph nodes of head, face and neck: Secondary | ICD-10-CM | POA: Insufficient documentation

## 2021-01-14 DIAGNOSIS — Z87442 Personal history of urinary calculi: Secondary | ICD-10-CM | POA: Diagnosis not present

## 2021-01-14 DIAGNOSIS — R634 Abnormal weight loss: Secondary | ICD-10-CM | POA: Insufficient documentation

## 2021-01-14 DIAGNOSIS — C61 Malignant neoplasm of prostate: Secondary | ICD-10-CM | POA: Insufficient documentation

## 2021-01-14 DIAGNOSIS — Z923 Personal history of irradiation: Secondary | ICD-10-CM | POA: Insufficient documentation

## 2021-01-14 DIAGNOSIS — Z800 Family history of malignant neoplasm of digestive organs: Secondary | ICD-10-CM | POA: Insufficient documentation

## 2021-01-14 DIAGNOSIS — Z9049 Acquired absence of other specified parts of digestive tract: Secondary | ICD-10-CM | POA: Diagnosis not present

## 2021-01-14 DIAGNOSIS — E860 Dehydration: Secondary | ICD-10-CM | POA: Diagnosis not present

## 2021-01-14 DIAGNOSIS — Z9641 Presence of insulin pump (external) (internal): Secondary | ICD-10-CM | POA: Diagnosis not present

## 2021-01-14 DIAGNOSIS — Z85810 Personal history of malignant neoplasm of tongue: Secondary | ICD-10-CM | POA: Insufficient documentation

## 2021-01-14 DIAGNOSIS — Z8249 Family history of ischemic heart disease and other diseases of the circulatory system: Secondary | ICD-10-CM | POA: Diagnosis not present

## 2021-01-14 DIAGNOSIS — E103293 Type 1 diabetes mellitus with mild nonproliferative diabetic retinopathy without macular edema, bilateral: Secondary | ICD-10-CM | POA: Diagnosis not present

## 2021-01-14 DIAGNOSIS — Z87891 Personal history of nicotine dependence: Secondary | ICD-10-CM | POA: Insufficient documentation

## 2021-01-14 DIAGNOSIS — I959 Hypotension, unspecified: Secondary | ICD-10-CM | POA: Insufficient documentation

## 2021-01-14 DIAGNOSIS — Z833 Family history of diabetes mellitus: Secondary | ICD-10-CM | POA: Diagnosis not present

## 2021-01-14 DIAGNOSIS — R682 Dry mouth, unspecified: Secondary | ICD-10-CM | POA: Insufficient documentation

## 2021-01-14 DIAGNOSIS — Z7982 Long term (current) use of aspirin: Secondary | ICD-10-CM | POA: Insufficient documentation

## 2021-01-14 DIAGNOSIS — E041 Nontoxic single thyroid nodule: Secondary | ICD-10-CM | POA: Insufficient documentation

## 2021-01-14 DIAGNOSIS — I890 Lymphedema, not elsewhere classified: Secondary | ICD-10-CM | POA: Diagnosis not present

## 2021-01-14 DIAGNOSIS — Z794 Long term (current) use of insulin: Secondary | ICD-10-CM | POA: Diagnosis not present

## 2021-01-14 DIAGNOSIS — Z95828 Presence of other vascular implants and grafts: Secondary | ICD-10-CM

## 2021-01-14 LAB — CBC WITH DIFFERENTIAL (CANCER CENTER ONLY)
Abs Immature Granulocytes: 0.01 10*3/uL (ref 0.00–0.07)
Basophils Absolute: 0.1 10*3/uL (ref 0.0–0.1)
Basophils Relative: 1 %
Eosinophils Absolute: 0.5 10*3/uL (ref 0.0–0.5)
Eosinophils Relative: 6 %
HCT: 39.7 % (ref 39.0–52.0)
Hemoglobin: 13.4 g/dL (ref 13.0–17.0)
Immature Granulocytes: 0 %
Lymphocytes Relative: 6 %
Lymphs Abs: 0.5 10*3/uL — ABNORMAL LOW (ref 0.7–4.0)
MCH: 30.2 pg (ref 26.0–34.0)
MCHC: 33.8 g/dL (ref 30.0–36.0)
MCV: 89.6 fL (ref 80.0–100.0)
Monocytes Absolute: 0.7 10*3/uL (ref 0.1–1.0)
Monocytes Relative: 9 %
Neutro Abs: 6 10*3/uL (ref 1.7–7.7)
Neutrophils Relative %: 78 %
Platelet Count: 219 10*3/uL (ref 150–400)
RBC: 4.43 MIL/uL (ref 4.22–5.81)
RDW: 12.3 % (ref 11.5–15.5)
WBC Count: 7.7 10*3/uL (ref 4.0–10.5)
nRBC: 0 % (ref 0.0–0.2)

## 2021-01-14 LAB — CMP (CANCER CENTER ONLY)
ALT: 39 U/L (ref 0–44)
AST: 29 U/L (ref 15–41)
Albumin: 3.5 g/dL (ref 3.5–5.0)
Alkaline Phosphatase: 100 U/L (ref 38–126)
Anion gap: 8 (ref 5–15)
BUN: 32 mg/dL — ABNORMAL HIGH (ref 8–23)
CO2: 24 mmol/L (ref 22–32)
Calcium: 9 mg/dL (ref 8.9–10.3)
Chloride: 104 mmol/L (ref 98–111)
Creatinine: 0.84 mg/dL (ref 0.61–1.24)
GFR, Estimated: >60 mL/min (ref >60)
Glucose, Bld: 232 mg/dL — ABNORMAL HIGH (ref 70–99)
Potassium: 4.7 mmol/L (ref 3.5–5.1)
Sodium: 136 mmol/L (ref 135–145)
Total Bilirubin: 0.3 mg/dL (ref 0.3–1.2)
Total Protein: 6.7 g/dL (ref 6.5–8.1)

## 2021-01-14 LAB — TSH: TSH: 0.593 u[IU]/mL (ref 0.320–4.118)

## 2021-01-14 MED ORDER — HEPARIN SOD (PORK) LOCK FLUSH 100 UNIT/ML IV SOLN
500.0000 [IU] | Freq: Once | INTRAVENOUS | Status: AC
  Administered 2021-01-14: 500 [IU]
  Filled 2021-01-14: qty 5

## 2021-01-14 MED ORDER — SODIUM CHLORIDE 0.9% FLUSH
10.0000 mL | Freq: Once | INTRAVENOUS | Status: AC
  Administered 2021-01-14: 10 mL
  Filled 2021-01-14: qty 10

## 2021-01-14 NOTE — Progress Notes (Signed)
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  -07/05/2020-08/20/2020: Received IMRT. 70 Gy in 35 Fx.   #Malignant neoplasm of prostate, Clinical stage IIC (cT2aN0M0)   HISTORY OF PRESENTING ILLNESS:  Jose Harvey 60 y.o. male with medical history significant for MALT lymphoma, prostate cancer and SCC of the base of tongue s/p definitive radiation. Patient is unaccompanied for this visit.   On exam today, Mr. Staloch reports he has been well in the interim since her last visit.  He notes he has not been using his G-tube for the last month.  Unfortunately he has been losing weight slightly in that time interval.  His weight dropped down to 158 pounds today from 165 pounds back in June.  He notes that he has "relearning what to eat".  He notes that meat still give him trouble but that recently he was able eat a hotdog and bun without difficulty.  He notes that his taste is improving but that coffee taste "water down".  He otherwise feels quite well and has no questions concerns or complaints.  He denies any fevers, chills, night sweats, chest pain or cough. He has no other complaints. Rest of the 10 point ROS is below.   MEDICAL HISTORY:   Past Medical History:  Diagnosis Date   Adrenal adenoma, left 03/2017   noted on CT   Allergic rhinitis, cause unspecified    Anxiety state, unspecified    Aortic atherosclerosis ([place]) 03/2017   noted on CT   BPV (benign positional vertigo)    COPD (chronic obstructive pulmonary disease) with emphysema (HCC)    mild followed by [facility]    Coronary artery disease    Depressive disorder, not elsewhere classified    Dyspnea    history of   ED (erectile dysfunction)    Gallbladder polyp 03/2016   noted on CT   Hepatic hemangioma 03/2017   noted on CT   History of kidney stones    hx of years ago    Indigestion    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)    Mixed hyperlipidemia    Prostate cancer ([place])    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, not stated as uncontrolled(250.51)    Type I (juvenile type) diabetes mellitus without mention of complication, not stated as uncontrolled    type I - followed by Dr Buddy Duty    Vesicoureteral reflux     SURGICAL HISTORY: Past Surgical History:  Procedure Laterality Date   CHOLECYSTECTOMY N/A 05/17/2016   Procedure: LAPAROSCOPIC CHOLECYSTECTOMY;  Surgeon: Clovis Riley, MD;  Location: [facility];  Service: General;  Laterality: N/A;   cyst removed from throat      dental surgeries      IR GASTROSTOMY TUBE MOD SED  07/07/2020   IR IMAGING GUIDED PORT INSERTION  07/07/2020   LYMPHADENECTOMY Bilateral 07/02/2017   Procedure: BILATERAL LYMPHADENECTOMY;  Surgeon: Lucas Mallow, MD;  Location: [facility];  Service: Urology;  Laterality: Bilateral;   PROSTATE BIOPSY     ROBOT ASSISTED LAPAROSCOPIC RADICAL PROSTATECTOMY N/A 07/02/2017   Procedure: XI ROBOTIC ASSISTED LAPAROSCOPIC RADICAL PROSTATECTOMY;  Surgeon: Lucas Mallow, MD;  Location: [facility];  Service: Urology;  Laterality: N/A;   TONSILLECTOMY     VIDEO ASSISTED THORACOSCOPY (VATS)/ LOBECTOMY Right 05/08/2019   Procedure: Right VIDEO  ASSISTED THORACOSCOPY with   MIDDLE LOBECTOMY and Enbloc portion of Upper Lobe with Node dissection, Intercostal Nerve Block;  Surgeon: Melrose Nakayama, MD;  Location: [place];  Service: Thoracic;  Laterality: Right;   WISDOM TOOTH EXTRACTION      SOCIAL HISTORY: Social History   Socioeconomic History   Marital status: Divorced    Spouse name: Not on file   Number of children: 2   Years of education: Not on file   Highest education level: Not on file  Occupational History    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 Patient has two grown daughters but neither lives locally.   Social Determinants of Health   Financial Resource Strain: Low Risk    Difficulty of Paying Living Expenses: Not very hard  Food Insecurity: No Food Insecurity   Worried About Charity fundraiser in the Last Year: Never true   Ran Out of Food in the Last Year: Never true  Transportation Needs: No Transportation Needs   Lack of Transportation (Medical): No   Lack of Transportation (Non-Medical): No  Physical Activity: Not on file  Stress: No Stress Concern Present   Feeling of Stress : Only a little  Social Connections: Unknown   Frequency of Communication with Friends and Family: More than three times a week   Frequency of Social Gatherings with Friends and Family: More than three times a week   Attends Religious Services: More than 4 times per year    Active Member of [employer] or Organizations: Yes   Attends Music therapist: More than 4 times per year   Marital Status: Not on file  Intimate Partner Violence: Not on file    FAMILY HISTORY: Family History  Problem Relation Age of Onset   Diabetes type I Father    Heart attack Father    Hypertension Father    Emphysema Father    CAD Father    Colon cancer Maternal Grandfather    Emphysema Paternal Grandfather    Diabetes type I Paternal Grandfather    Kidney disease Neg Hx    Liver disease Neg Hx    Breast cancer Neg Hx    Prostate cancer Neg Hx     ALLERGIES:  is allergic to crestor [rosuvastatin], lipitor [atorvastatin], other, and zocor [simvastatin].  MEDICATIONS:  Current Outpatient Medications  Medication Sig Dispense Refill   Coenzyme Q10 (COQ10) 100 MG CAPS Take 100 mg by mouth daily.     aspirin 81 MG tablet Take 1 tablet (81 mg total) by mouth daily. 30 tablet    cetirizine (ZYRTEC) 10 MG tablet Take 10 mg by mouth daily.     chlorhexidine (PERIDEX) 0.12 % solution Use as directed 15 mLs in the mouth or throat daily.     Continuous Blood Gluc Sensor (FREESTYLE LIBRE 14 DAY SENSOR) MISC AS DIRECTED CHANGE SENSOR EVERY 14 DAYS 28     ibuprofen (ADVIL) 200 MG tablet Take 400-800 mg by mouth every 6 (six) hours as needed for moderate pain. (Patient not taking: Reported on 06/17/2020)     Insulin Human (INSULIN PUMP) SOLN Inject into the skin continuous. NOVOLOG 100 UNIT/ML VIAL     lidocaine (XYLOCAINE) 2 % solution Patient: Mix 1part 2% viscous lidocaine, 1part H20. Swish & swallow 17m of diluted mixture, 313m before meals and at bedtime, up to QID 200 mL 4   lidocaine-prilocaine (EMLA) cream Apply to affected area once 30 g 3   lisinopril (PRINIVIL,ZESTRIL) 5 MG tablet Take 5 mg by mouth daily.      magic mouthwash w/lidocaine SOLN 45m91mral swish and swallow 4 times daily as needed  1 Part viscous lidocaine 2% 1 Part Maalox 1 Part diphenhydramine 12.5 mg  per 5 ml elixir 1 part Nystatin 500000 units per 45ml17m0 mL 2   Multiple Vitamins-Minerals (ICAPS AREDS 2) CAPS Take 1 capsule by mouth daily.     NOVOLOG 100 UNIT/ML injection Averages about 30 units per day via insulin pump     Nutritional Supplements (FEEDING SUPPLEMENT, GLUCERNA 1.5 CAL,) LIQD Give 2 cartons at 8am, noon and 4pm. Give 1 carton at 8pm. Flush with 60ml61mwater before and after.  Drink or give additional water via tube of 240ml 23mmes per day.  Meets 100% of needs.  Send bolus supplies. 1659 mL 5   oxyCODONE (ROXICODONE) 5 MG/5ML solution Take 5 mLs (5 mg total) by mouth every 6 (six) hours as needed for severe pain. 200 mL 0   pantoprazole (PROTONIX) 40 MG tablet Take 40 mg by mouth daily.     rosuvastatin (CRESTOR) 5 MG tablet Take 1 tablet (5 mg total) by mouth daily. 90 tablet 3   sertraline (ZOLOFT) 100 MG tablet Take 50 mg by mouth daily.  0   sodium fluoride (SODIUM FLUORIDE 5000 PLUS) 1.1 % CREA dental cream Place 1 application onto teeth every evening. Place 1 pea-size drop into each tooth space in fluoride trays. Use once a day a night. 30 g 2   No current facility-administered medications for this visit.    REVIEW OF SYSTEMS:   Constitutional: ( - ) fevers, ( - )  chills , ( - ) night sweats Eyes: ( - ) blurriness of vision, ( - ) double vision, ( - ) watery eyes Ears, nose, mouth, throat, and face: ( - ) mucositis, ( - ) sore throat Respiratory: ( - ) cough, ( - ) dyspnea, ( - ) wheezes Cardiovascular: ( - ) palpitation, ( - ) chest discomfort, ( - ) lower extremity swelling Gastrointestinal:  ( - ) nausea, ( - ) heartburn, ( + ) change in bowel habits Skin: ( - ) abnormal skin rashes Lymphatics: ( - ) new lymphadenopathy, ( - ) easy bruising Neurological: ( - ) numbness, ( - ) tingling, ( - )  new weaknesses Behavioral/Psych: ( - ) mood change, ( - ) new changes  All other systems were reviewed with the patient and are negative.  PHYSICAL EXAMINATION: ECOG  PERFORMANCE STATUS: 1 - Symptomatic but completely ambulatory  Vitals:   01/14/21 1124  BP: 113/68  Pulse: 75  Resp: 18  Temp: 97.9 F (36.6 C)  SpO2: 98%   Filed Weights   01/14/21 1124  Weight: 158 lb 9.6 oz (71.9 kg)    GENERAL: well appearing male in NAD  SKIN: skin color, texture, turgor are normal, no rashes or significant lesions EYES: conjunctiva are pink and non-injected, sclera clear OROPHARYNX: no exudate, no erythema; lips, buccal mucosa, and tongue normal  LUNGS: clear to auscultation and percussion with normal breathing effort HEART: regular rate & rhythm and no murmurs and no lower extremity edema ABDOMEN: soft, non-tender, non-distended, normal bowel sounds Musculoskeletal: no cyanosis of digits and no clubbing  PSYCH: alert & oriented x 3, fluent speech NEURO: no focal motor/sensory deficits  LABORATORY DATA:  I have reviewed the data as listed CBC Latest Ref Rng & Units 01/14/2021 10/26/2020 09/29/2020  WBC 4.0 - 10.5 K/uL 7.7 6.1 7.2  Hemoglobin 13.0 - 17.0 g/dL 13.4 13.6 11.8(L)  Hematocrit 39.0 - 52.0 % 39.7 40.4 35.6(L)  Platelets 150 - 400 K/uL 219 242 231    CMP Latest Ref Rng & Units 01/14/2021 10/26/2020 09/29/2020  Glucose 70 - 99 mg/dL 232(H) 214(H) 175(H)  BUN 8 - 23 mg/dL 32(H) 35(H) 32(H)  Creatinine 0.61 - 1.24 mg/dL 0.84 0.85 0.76  Sodium 135 - 145 mmol/L 136 137 140  Potassium 3.5 - 5.1 mmol/L 4.7 4.6 4.6  Chloride 98 - 111 mmol/L 104 104 106  CO2 22 - 32 mmol/L '24 27 26  '$ Calcium 8.9 - 10.3 mg/dL 9.0 9.0 8.6(L)  Total Protein 6.5 - 8.1 g/dL 6.7 6.4(L) 5.7(L)  Total Bilirubin 0.3 - 1.2 mg/dL 0.3 0.4 0.3  Alkaline Phos 38 - 126 U/L 100 81 68  AST 15 - 41 U/L '29 27 29  '$ ALT 0 - 44 U/L 39 61(H) 46(H)   ASSESSMENT & PLAN OMARIEN TORR is a 60 y.o. male presenting to the clinic for a follow up after completion of definitive radiation for SCC of the tongue. Patient notes he has good p.o. intake and has not been using his PEG tube for 1 month's  time.  He is not having any other concerning symptoms at this time.  #SCC of the tongue: --07/05/2020-08/20/2020: Received IMRT. 70 Gy in 35 Fx.  --Under surveillance --After discussion with Rad/Onc will order PEG tube and port removal --Scheduled for a follow up with Dr. Isidore Moos later today.   #MALT lymphoma: --Underwent thoracoscopy with middle lobectomy on 05/08/2019.  --Under surveillance --Scheduled for a follow up with Dr. Irene Limbo in 8 months per Rad/Onc request.   #Dehydration: --Currently patient drinks about 1.5 L of water per day.  --Encouraged to increase intake of fluids to at least 2-3 L per day  #Hypotension: --Prescribed lisinopril 5 mg daily  --Today's BP was 113/68  --advised patient to check his BP daily and follow up with his PCP whether to restart lisinopril.  #Diarrhea: --Resolved    Orders Placed This Encounter  Procedures   IR GASTROSTOMY TUBE REMOVAL    Please schedule to have port removed at same time    Standing Status:   Future    Standing Expiration Date:   01/18/2022    Order Specific Question:  Reason for Exam (SYMPTOM  OR DIAGNOSIS REQUIRED)    Answer:   patient completed treatment, requesting tube removal    Order Specific Question:   Preferred Imaging Location?    Answer:   [facility]   IR Removal Tun Access W/ Port W/O [place]    Please schedule to have PEG tube removed at same time    Standing Status:   Future    Standing Expiration Date:   01/18/2022    Order Specific Question:   Reason for exam:    Answer:   patient completed chemotherapy, requesting port removal    Order Specific Question:   Preferred Imaging Location?    Answer:   [place] ([place] only)    Standing Status:   Future    Number of Occurrences:   1    Standing Expiration Date:   01/14/2022   CBC with Differential (Cancer Center Only)    Standing Status:   Future    Number of Occurrences:   1    Standing Expiration Date:   01/14/2022   TSH     Standing Status:   Future    Number of Occurrences:   1    Standing Expiration Date:   01/14/2022    All questions were answered. The patient knows to call the clinic with any problems, questions or concerns.  I have spent a total of 30 minutes minutes of face-to-face and non-face-to-face time, preparing to see the patient, obtaining and/or reviewing separately obtained history, performing a medically appropriate examination, counseling and educating the patient, ordering medications/tests, documenting clinical information in the electronic health record and care coordination.   Ledell Peoples, MD Department of Hematology/Oncology [place] at [facility] Phone: [phone] Pager: [phone] Email: [email or web address]

## 2021-01-14 NOTE — Progress Notes (Signed)
Radiation Oncology         [phone] ________________________________  Name: Jose Harvey MRN: [id number]  Date: 01/14/2021  DOB: 02/15/1961  Follow-Up Visit Note  CC: Leeroy Cha, MD  Brunetta Genera, MD  Diagnosis and Prior Radiotherapy:       ICD-10-CM   1. 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       Cancer Staging Cancer of base of tongue ([place]) Staging form: Pharynx - HPV-Mediated Oropharynx, AJCC 8th Edition - Clinical stage from 06/11/2020: Stage I (cT2, cN1, cM0, p16+) - Signed by Eppie Gibson, MD on 06/11/2020 Stage prefix: Initial diagnosis  Malignant neoplasm of prostate [facility]) Staging form: Prostate, AJCC 8th Edition - Clinical: Stage IIC (cT2a, cN0, cM0, PSA: 4.1, Grade Group: 3) - Signed by Tyler Pita, MD on 06/06/2017 Prostate specific antigen (PSA) range: Less than 10 Gleason score: 7 Histologic grading system: 5 grade system   CHIEF COMPLAINT:  Here for follow-up and surveillance of throat cancer  Narrative:  Jose Harvey presents today for after completing radiation to his base of tongue on 08/20/2020, and to review PET scan results from 01/11/2021  Pain issues, if any: Patient denies. Reports throat and right ear pain have both resolved Using a feeding tube?: No--eating/drinking everything by mouth. State he flushes PEG with water every other day Weight changes, if any:  Wt Readings from Last 3 Encounters:  01/14/21 158 lb 9.6 oz (71.9 kg)  11/12/20 165 lb (74.8 kg)  10/26/20 165 lb 1.6 oz (74.9 kg)   Swallowing issues, if any: Overall he denies--there are still some foods he can have difficulty with (meats/breads), but on the whole he feels he can eat and drink a wide variety Smoking or chewing tobacco? None Using fluoride trays daily? Occasionally, but states his mouth is still sensitive so he can only tolerate occasionally. Denies any dental concerns. Last ENT visit was on: Schedule to see Dr. Melony Overly in September Other  notable issues, if any: Reports occasional thick saliva, and states he only notices dry mouth when he talks for a long time. Feels his energy has returned to ~60%. Continues to work with Antonietta Jewel for lymphedema, and is hopeful he will be discharged after his next session. Denies any lingering itching or skin concerns in treatment field. Overall he reports he feels well and is pleased with his continued progress    ALLERGIES:  is allergic to crestor [rosuvastatin], lipitor [atorvastatin], other, and zocor [simvastatin].  Meds: Current Outpatient Medications  Medication Sig Dispense Refill   aspirin 81 MG tablet Take 1 tablet (81 mg total) by mouth daily. 30 tablet    cetirizine (ZYRTEC) 10 MG tablet Take 10 mg by mouth daily.     chlorhexidine (PERIDEX) 0.12 % solution Use as directed 15 mLs in the mouth or throat daily.     Coenzyme Q10 (COQ10) 100 MG CAPS Take 100 mg by mouth daily.     Continuous Blood Gluc Sensor (FREESTYLE LIBRE 14 DAY SENSOR) MISC AS DIRECTED CHANGE SENSOR EVERY 14 DAYS 28     ibuprofen (ADVIL) 200 MG tablet Take 400-800 mg by mouth every 6 (six) hours as needed for moderate pain. (Patient not taking: Reported on 06/17/2020)     Insulin Human (INSULIN PUMP) SOLN Inject into the skin continuous. NOVOLOG 100 UNIT/ML VIAL     lidocaine (XYLOCAINE) 2 % solution Patient: Mix 1part 2% viscous lidocaine, 1part H20. Swish & swallow 21m of diluted mixture, 386m before meals and at bedtime,  up to QID 200 mL 4   lidocaine-prilocaine (EMLA) cream Apply to affected area once 30 g 3   lisinopril (PRINIVIL,ZESTRIL) 5 MG tablet Take 5 mg by mouth daily.      magic mouthwash w/lidocaine SOLN 48m oral swish and swallow 4 times daily as needed  1 Part viscous lidocaine 2% 1 Part Maalox 1 Part diphenhydramine 12.5 mg per 5 ml elixir 1 part Nystatin 500000 units per 561m200 mL 2   Multiple Vitamins-Minerals (ICAPS AREDS 2) CAPS Take 1 capsule by mouth daily.     NOVOLOG 100 UNIT/ML  injection Averages about 30 units per day via insulin pump     Nutritional Supplements (FEEDING SUPPLEMENT, GLUCERNA 1.5 CAL,) LIQD Give 2 cartons at 8am, noon and 4pm. Give 1 carton at 8pm. Flush with 6045mf water before and after.  Drink or give additional water via tube of 240m34mtimes per day.  Meets 100% of needs.  Send bolus supplies. 1659 mL 5   oxyCODONE (ROXICODONE) 5 MG/5ML solution Take 5 mLs (5 mg total) by mouth every 6 (six) hours as needed for severe pain. 200 mL 0   pantoprazole (PROTONIX) 40 MG tablet Take 40 mg by mouth daily.     rosuvastatin (CRESTOR) 5 MG tablet Take 1 tablet (5 mg total) by mouth daily. 90 tablet 3   sertraline (ZOLOFT) 100 MG tablet Take 50 mg by mouth daily.  0   sodium fluoride (SODIUM FLUORIDE 5000 PLUS) 1.1 % CREA dental cream Place 1 application onto teeth every evening. Place 1 pea-size drop into each tooth space in fluoride trays. Use once a day a night. 30 g 2   No current facility-administered medications for this encounter.    Physical Findings: The patient is in no acute distress. Patient is alert and oriented. Wt Readings from Last 3 Encounters:  01/14/21 158 lb 9.6 oz (71.9 kg)  11/12/20 165 lb (74.8 kg)  10/26/20 165 lb 1.6 oz (74.9 kg)    vitals were not taken for this visit. .  See med onc note.  General: Alert and oriented, in no acute distress HEENT: Head is normocephalic. Extraocular movements are intact. Oropharynx is notable for no thrush, no tumor Neck: No palpable adenopathy Skin: Skin in treatment fields shows satisfactory healing  Psychiatric: Judgment and insight are intact. Affect is appropriate. HEART RRR CHEST CTAB   Lab Findings: Lab Results  Component Value Date   WBC 7.7 01/14/2021   HGB 13.4 01/14/2021   HCT 39.7 01/14/2021   MCV 89.6 01/14/2021   PLT 219 01/14/2021    Lab Results  Component Value Date   TSH 0.593 01/14/2021    Radiographic Findings: NM PET Image Restag (PS) Skull Base To  Thigh  Result Date: 01/11/2021 CLINICAL DATA:  Subsequent treatment strategy for head and neck cancer. EXAM: NUCLEAR MEDICINE PET SKULL BASE TO THIGH TECHNIQUE: 8.3 mCi F-18 FDG was injected intravenously. Full-ring PET imaging was performed from the skull base to thigh after the radiotracer. CT data was obtained and used for attenuation correction and anatomic localization. Fasting blood glucose: 237 mg/dl COMPARISON:  07/07/2020. FINDINGS: Mediastinal blood pool activity: SUV max 2.7 Liver activity: SUV max NA NECK: The focal hypermetabolism identified in the posterior right base of tongue previously has resolved in the interval. The hypermetabolic right cervical lymphadenopathy seen previously has resolved with no hypermetabolic cervical lymph nodes on today's study. Dominant right-sided level II lymph node seen on CT imaging previously is no longer evident.  The 10 mm lymph node posterior to the right hyoid bone described on the previous study has resolved. Incidental CT findings: none CHEST: No hypermetabolic mediastinal or hilar nodes. No suspicious pulmonary nodules on the CT scan. Incidental CT findings: Stable 16 mm left thyroid nodule without hypermetabolism. Right Port-A-Cath tip is in the distal SVC. Coronary artery calcification is evident. Mild atherosclerotic calcification is noted in the wall of the thoracic aorta. Centrilobular emphsyema noted. Staple line again noted posterior right upper lobe. The subtle left upper lobe ground-glass opacity seen previously is not readily apparent on today's exam. 3 mm left upper lobe nodule on 18/9 is stable. 2 mm right upper lobe nodule on 24/9 is unchanged. 2 mm right perifissural nodule on 28/9 is stable. ABDOMEN/PELVIS: No abnormal hypermetabolic activity within the liver, pancreas, right adrenal gland, or spleen. Low level FDG uptake in the left adrenal nodule is stable. No hypermetabolic lymph nodes in the abdomen or pelvis. Incidental CT findings:  Gastrostomy tube again noted. Gallbladder is surgically absent. Multiple hypoattenuating liver lesions evident without hypermetabolism. These are stable and were previously characterized as hemangiomas. There is moderate atherosclerotic calcification of the abdominal aorta without aneurysm. Small right groin hernia contains only fat. SKELETON: No focal hypermetabolic activity to suggest skeletal metastasis. Incidental CT findings: none IMPRESSION: 1. Interval resolution of the hypermetabolic posterior right tongue lesion and right-sided cervical lymphadenopathy. No new or progressive hypermetabolic disease on today's study. 2. 1.6 cm ground-glass opacity in the anterior left upper lobe noted previously has resolved in the interval suggesting infectious/inflammatory etiology. 3. Tiny scattered 2 mm pulmonary nodules bilaterally are stable in the interval, likely benign. Continued attention on follow-up recommended. 4. Stable low level FDG accumulation in the left adrenal nodule. This is been present since 2018. While likely benign, continued attention on follow-up recommended. 5.  Aortic Atherosclerois (ICD10-170.0) Electronically Signed   By: Misty Stanley M.D.   On: 01/11/2021 21:25    Impression/Plan:    1) Head and Neck Cancer Status: in remission, doing well.  I asked Dr Lorenso Courier to order removal of PEG and PORT. Patient has not used peg in 1 mo and will push nutrition by mouth, wishes for its removal.  2) Nutritional Status:  removing PEG, push po to maintain weight Wt Readings from Last 3 Encounters:  01/14/21 158 lb 9.6 oz (71.9 kg)  11/12/20 165 lb (74.8 kg)  10/26/20 165 lb 1.6 oz (74.9 kg)   3)  Swallowing: Continue speech-language pathology exercises  4) Dental: Encouraged to continue regular followup with dentistry, and dental hygiene including fluoride rinses.   5) Thyroid function: Check annually in med onc Lab Results  Component Value Date   TSH 0.593 01/14/2021    6)  Dr. Lorenso Courier  is going to order for PEG and Port-A-Cath removal.     CT chest without contrast in 4 months and follow-up to see me soon thereafter.  Anderson Malta RN will arrange follow-up with Dr. Irene Limbo in 8 months.   On date of service, in total, I spent 25 minutes on this encounter. Patient was seen in person. _____________________________________   Eppie Gibson, MD

## 2021-01-14 NOTE — Progress Notes (Signed)
Mr. Ell presents today for after completing radiation to his base of tongue on 08/20/2020, and to review PET scan results from 01/11/2021  Pain issues, if any: Patient denies. Reports throat and right ear pain have both resolved Using a feeding tube?: No--eating/drinking everything by mouth. State he flushes PEG with water every other day Weight changes, if any:  Wt Readings from Last 3 Encounters:  01/14/21 158 lb 9.6 oz (71.9 kg)  11/12/20 165 lb (74.8 kg)  10/26/20 165 lb 1.6 oz (74.9 kg)   Swallowing issues, if any: Overall he denies--there are still some foods he can have difficulty with (meats/breads), but on the whole he feels he can eat and drink a wide variety Smoking or chewing tobacco? None Using fluoride trays daily? Occasionally, but states his mouth is still sensitive so he can only tolerate occasionally. Denies any dental concerns. Last ENT visit was on: Schedule to see Dr. Melony Overly in September Other notable issues, if any: Reports occasional thick saliva, and states he only notices dry mouth when he talks for a long time. Feels his energy has returned to ~60%. Continues to work with Antonietta Jewel for lymphedema, and is hopeful he will be discharged after his next session. Denies any lingering itching or skin concerns in treatment field. Overall he reports he feels well and is pleased with his continued progress

## 2021-01-17 ENCOUNTER — Other Ambulatory Visit: Payer: Self-pay

## 2021-01-17 ENCOUNTER — Encounter: Payer: Self-pay | Admitting: Physical Therapy

## 2021-01-17 ENCOUNTER — Encounter: Payer: Self-pay | Admitting: Radiation Oncology

## 2021-01-17 ENCOUNTER — Ambulatory Visit: Payer: BC Managed Care – PPO | Admitting: Physical Therapy

## 2021-01-17 DIAGNOSIS — R293 Abnormal posture: Secondary | ICD-10-CM

## 2021-01-17 DIAGNOSIS — I890 Lymphedema, not elsewhere classified: Secondary | ICD-10-CM

## 2021-01-17 DIAGNOSIS — J449 Chronic obstructive pulmonary disease, unspecified: Secondary | ICD-10-CM | POA: Diagnosis not present

## 2021-01-17 DIAGNOSIS — C8599 Non-Hodgkin lymphoma, unspecified, extranodal and solid organ sites: Secondary | ICD-10-CM | POA: Diagnosis not present

## 2021-01-17 DIAGNOSIS — C01 Malignant neoplasm of base of tongue: Secondary | ICD-10-CM | POA: Diagnosis not present

## 2021-01-17 DIAGNOSIS — Z902 Acquired absence of lung [part of]: Secondary | ICD-10-CM | POA: Diagnosis not present

## 2021-01-17 DIAGNOSIS — E103293 Type 1 diabetes mellitus with mild nonproliferative diabetic retinopathy without macular edema, bilateral: Secondary | ICD-10-CM | POA: Diagnosis not present

## 2021-01-17 NOTE — Therapy (Signed)
[place], [place], [address] Phone: [phone]   Fax:  [phone]  Physical Therapy Treatment  Patient Details  Name: Jose Harvey MRN: [id number] Date of Birth: 1960/06/06 Referring Provider (PT): Isidore Moos   Encounter Date: 01/17/2021   PT End of Session - 01/17/21 [id number]     Visit Number 4    Number of Visits 9    Date for PT Re-Evaluation 02/03/21    PT Start Time [id number]    PT Stop Time [id number]    PT Time Calculation (min) 50 min    Activity Tolerance Patient tolerated treatment well    Behavior During Therapy [facility] for tasks assessed/performed             Past Medical History:  Diagnosis Date   Adrenal adenoma, left 03/2017   noted on CT   Allergic rhinitis, cause unspecified    Anxiety state, unspecified    Aortic atherosclerosis ([place]) 03/2017   noted on CT   BPV (benign positional vertigo)    COPD (chronic obstructive pulmonary disease) with emphysema ([place])    mild followed by  Pulmonary    Coronary artery disease    Depressive disorder, not elsewhere classified    Dyspnea    history of   ED (erectile dysfunction)    Gallbladder polyp 03/2016   noted on CT   Hepatic hemangioma 03/2017   noted on CT   History of kidney stones    hx of years ago    Indigestion    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)    Mixed hyperlipidemia    Prostate cancer ([place])    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, not stated as uncontrolled(250.51)    Type I (juvenile type) diabetes mellitus without mention of complication, not stated as uncontrolled    type I - followed by Dr Buddy Duty    Vesicoureteral reflux     Past Surgical History:  Procedure Laterality Date   CHOLECYSTECTOMY N/A 05/17/2016   Procedure: LAPAROSCOPIC CHOLECYSTECTOMY;  Surgeon: Clovis Riley, MD;  Location: [facility];  Service: General;  Laterality: N/A;   cyst removed from throat       dental surgeries      IR GASTROSTOMY TUBE MOD SED  07/07/2020   IR IMAGING GUIDED PORT INSERTION  07/07/2020   LYMPHADENECTOMY Bilateral 07/02/2017   Procedure: BILATERAL LYMPHADENECTOMY;  Surgeon: Lucas Mallow, MD;  Location: [facility];  Service: Urology;  Laterality: Bilateral;   PROSTATE BIOPSY     ROBOT ASSISTED LAPAROSCOPIC RADICAL PROSTATECTOMY N/A 07/02/2017   Procedure: XI ROBOTIC ASSISTED LAPAROSCOPIC RADICAL PROSTATECTOMY;  Surgeon: Lucas Mallow, MD;  Location: [facility];  Service: Urology;  Laterality: N/A;   TONSILLECTOMY     VIDEO ASSISTED THORACOSCOPY (VATS)/ LOBECTOMY Right 05/08/2019   Procedure: Right VIDEO ASSISTED THORACOSCOPY with   MIDDLE LOBECTOMY and Enbloc portion of Upper Lobe with Node dissection, Intercostal Nerve Block;  Surgeon: Melrose Nakayama, MD;  Location: [facility] OR;  Service: Thoracic;  Laterality: Right;   WISDOM TOOTH EXTRACTION      There were no vitals filed for this visit.   Subjective Assessment - 01/17/21 1304     Subjective The garment stays on and I slept in it. It is more comfortable than the one with the knot on it.    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 No/denies    Pain Score 0-No pain                               [facility] Adult PT Treatment/Exercise - 01/17/21 0001       Manual Therapy   Manual Lymphatic Drainage (MLD) in supine with head of bed  elevated: short neck, 5 diaphragmatic breaths, bilateral axillary nodes, bilateral pectoral nodes, anterior chest, short neck, posterior, lateral and anterior neck moving fluid towards pathways then retracing all steps                    PT Education - 01/17/21 1352     Education Details how to don head and neck garment    Person(s) Educated Patient    Methods Explanation;Demonstration    Comprehension Verbalized understanding;Returned demonstration                 PT Long Term Goals - 12/23/20 1250       PT LONG TERM GOAL #1   Title Pt will be independent in self MLD for long term management of lymphedema.    Time 4    Period Weeks    Status New    Target Date 01/20/21      PT LONG TERM GOAL #2   Title Pt will obtain appropriate compression garments for long term management of lymphedema.    Time 4    Period Weeks    Status New    Target Date 01/20/21      PT LONG TERM GOAL #3   Title Pt will no longer demonstrate fibrosis of anterior neck to decrease risk of infection.    Time 4    Period Weeks    Status New    Target Date 01/20/21                   Plan - 01/17/21 1354     Clinical Impression Statement Pt received his solaris swell spot head and neck garment and was educated today in correct donning/doffing. Pt demonstrates good reduction of anterior neck lymphedema. Will assess progress towards goals and next session and ensure pt is able to independently manage his lymphedema through compression garments and self MLD. Pt is progressing towards discharge.    PT Frequency 2x / week    PT Duration 4 weeks    PT Treatment/Interventions ADLs/Self Care Home Management;Patient/family education;Therapeutic exercise;Manual techniques;Manual lymph drainage;Compression bandaging;Taping;Vasopneumatic Device    PT Next Visit Plan assess progress towards goals, continue MLD to neck and have pt return demonstrate, see if pt received solaris head and neck  garment    PT Home Exercise Plan wear chip pack, self MLD    Consulted and Agree with Plan of Care Patient             Patient will benefit from skilled therapeutic intervention in order to improve the following deficits and impairments:  Postural dysfunction, Decreased knowledge of precautions,  Increased edema  Visit Diagnosis: Lymphedema, not elsewhere classified  Abnormal posture  Malignant neoplasm of base of tongue ([place])     Problem List Patient Active Problem List   Diagnosis Date Noted   Dehydration 10/27/2020   Hypotension 10/27/2020   Diarrhea 10/27/2020   Port-A-Cath in place 07/13/2020   Counseling regarding advance care planning and goals of care 06/29/2020   Cancer of base of tongue ([place]) 06/11/2020   S/P thoracotomy 05/08/2019   Prostate cancer ([place]) 07/02/2017   Malignant neoplasm of prostate ([place]) 06/06/2017   DOE (dyspnea on exertion) 07/28/2014   COPD (chronic obstructive pulmonary disease) ([place]) 01/30/2014   Throat fullness 01/30/2014   Paroxysmal nocturnal dyspnea 11/17/2013   Hyperlipidemia 11/17/2013   Insulin dependent diabetes mellitus 11/17/2013   Essential hypertension 11/17/2013    Allyson Sabal [facility] 01/17/2021, 1:57 PM  [place], [place], [address] Phone: [phone]   Fax:  [phone]  Name: Jose Harvey MRN: [id number] Date of Birth: November 15, 1960  Manus Gunning, PT 01/17/21 2:00 PM

## 2021-01-17 NOTE — Progress Notes (Signed)
T

## 2021-01-18 ENCOUNTER — Telehealth: Payer: Self-pay | Admitting: Hematology

## 2021-01-18 ENCOUNTER — Encounter: Payer: Self-pay | Admitting: Hematology

## 2021-01-18 NOTE — Telephone Encounter (Signed)
Scheduled appt per 8/15 sch msg. Mailed updated calendar to pt.

## 2021-01-20 ENCOUNTER — Ambulatory Visit: Payer: BC Managed Care – PPO | Admitting: Physical Therapy

## 2021-01-20 ENCOUNTER — Other Ambulatory Visit: Payer: Self-pay

## 2021-01-20 ENCOUNTER — Encounter: Payer: Self-pay | Admitting: Physical Therapy

## 2021-01-20 DIAGNOSIS — I890 Lymphedema, not elsewhere classified: Secondary | ICD-10-CM

## 2021-01-20 DIAGNOSIS — C01 Malignant neoplasm of base of tongue: Secondary | ICD-10-CM | POA: Diagnosis not present

## 2021-01-20 DIAGNOSIS — R293 Abnormal posture: Secondary | ICD-10-CM

## 2021-01-20 NOTE — Therapy (Signed)
[name], [place], [address] Phone: [phone]   Fax:  [phone]  Physical Therapy Treatment  Patient Details  Name: Jose Harvey MRN: [id number] Date of Birth: 1961/01/06 Referring Provider (PT): Jose Harvey   Encounter Date: 01/20/2021   PT End of Session - 01/20/21 0388     Visit Number 5    Number of Visits 9    Date for PT Re-Evaluation 02/03/21    PT Start Time 8280    PT Stop Time 0349    PT Time Calculation (min) 44 min    Activity Tolerance Patient tolerated treatment well    Behavior During Therapy [facility] for tasks assessed/performed             Past Medical History:  Diagnosis Date   Adrenal adenoma, left 03/2017   noted on CT   Allergic rhinitis, cause unspecified    Anxiety state, unspecified    Aortic atherosclerosis ([place]) 03/2017   noted on CT   BPV (benign positional vertigo)    COPD (chronic obstructive pulmonary disease) with emphysema ([place])    mild followed by [facility]    Coronary artery disease    Depressive disorder, not elsewhere classified    Dyspnea    history of   ED (erectile dysfunction)    Gallbladder polyp 03/2016   noted on CT   Hepatic hemangioma 03/2017   noted on CT   History of kidney stones    hx of years ago    Indigestion    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)    Mixed hyperlipidemia    Prostate cancer ([place])    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, not stated as uncontrolled(250.51)    Type I (juvenile type) diabetes mellitus without mention of complication, not stated as uncontrolled    type I - followed by Dr Buddy Duty    Vesicoureteral reflux     Past Surgical History:  Procedure Laterality Date   CHOLECYSTECTOMY N/A 05/17/2016   Procedure: LAPAROSCOPIC CHOLECYSTECTOMY;  Surgeon: Clovis Riley, MD;  Location: [facility];  Service: General;  Laterality: N/A;   cyst removed from throat       dental surgeries      IR GASTROSTOMY TUBE MOD SED  07/07/2020   IR IMAGING GUIDED PORT INSERTION  07/07/2020   LYMPHADENECTOMY Bilateral 07/02/2017   Procedure: BILATERAL LYMPHADENECTOMY;  Surgeon: Lucas Mallow, MD;  Location: [facility];  Service: Urology;  Laterality: Bilateral;   PROSTATE BIOPSY     ROBOT ASSISTED LAPAROSCOPIC RADICAL PROSTATECTOMY N/A 07/02/2017   Procedure: XI ROBOTIC ASSISTED LAPAROSCOPIC RADICAL PROSTATECTOMY;  Surgeon: Lucas Mallow, MD;  Location: [facility];  Service: Urology;  Laterality: N/A;   TONSILLECTOMY     VIDEO ASSISTED THORACOSCOPY (VATS)/ LOBECTOMY Right 05/08/2019   Procedure: Right VIDEO ASSISTED THORACOSCOPY with   MIDDLE LOBECTOMY and Enbloc portion of Upper Lobe with Node dissection, Intercostal Nerve Block;  Surgeon: Melrose Nakayama, MD;  Location: [facility];  Service: Thoracic;  Laterality: Right;   WISDOM TOOTH EXTRACTION      There were no vitals filed for this visit.   Subjective Assessment - 01/20/21 1310     Subjective I have been wearing the garment. I think I am ready for discharge.    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 No/denies    Pain Score 0-No pain                               [facility] Adult PT Treatment/Exercise - 01/20/21 0001       Manual Therapy   Manual Lymphatic Drainage (MLD) in supine with head of bed elevated: short neck, 5  diaphragmatic breaths, bilateral axillary nodes, bilateral pectoral nodes, anterior chest, short neck, posterior, lateral and anterior neck moving fluid towards pathways then retracing all steps                         PT Long Term Goals - 01/20/21 1311       PT LONG TERM GOAL #1   Title Pt will be independent in self MLD for long term management of lymphedema.    Baseline 01/20/21- pt is independent in self MLD    Time 4    Period Weeks    Status Achieved      PT LONG TERM GOAL #2   Title Pt will obtain appropriate compression garments for long term management of lymphedema.    Baseline 01/20/21- pt has received his garment and has been wearing it    Time 4    Period Weeks    Status Achieved      PT LONG TERM GOAL #3   Title Pt will no longer demonstrate fibrosis of anterior neck to decrease risk of infection.    Baseline 01/20/21- pt no longer has fibrosis of anterior neck    Time 4    Period Weeks    Status Achieved                   Plan - 01/20/21 1354     Clinical Impression Statement Assessed pt's progress towards goals in therapy. Pt has now met all goals for therapy. He is independent with self MLD and has obtained compression garments. His neck circumferences have decreased greatly and pt no longer demonstrates anterior neck edema. At this time pt will be discharged from skilled PT services.    PT Frequency 2x / week    PT Duration 4 weeks    PT Treatment/Interventions ADLs/Self Care Home Management;Patient/family education;Therapeutic exercise;Manual techniques;Manual lymph drainage;Compression bandaging;Taping;Vasopneumatic Device    PT Next Visit Plan d/c this visit    PT Home Exercise Plan wear chip pack, self MLD    Consulted and Agree with Plan of Care Patient             Patient will benefit from skilled therapeutic intervention in order to improve the following deficits and impairments:  Postural dysfunction, Decreased knowledge of  precautions, Increased edema  Visit Diagnosis: Lymphedema, not elsewhere classified  Abnormal posture  Malignant neoplasm of base of tongue ([place])     Problem List Patient Active Problem List   Diagnosis Date Noted   Dehydration 10/27/2020   Hypotension 10/27/2020   Diarrhea 10/27/2020   Port-A-Cath in place 07/13/2020   Counseling  regarding advance care planning and goals of care 06/29/2020   Cancer of base of tongue ([place]) 06/11/2020   S/P thoracotomy 05/08/2019   Prostate cancer ([place]) 07/02/2017   Malignant neoplasm of prostate ([place]) 06/06/2017   DOE (dyspnea on exertion) 07/28/2014   COPD (chronic obstructive pulmonary disease) ([place]) 01/30/2014   Throat fullness 01/30/2014   Paroxysmal nocturnal dyspnea 11/17/2013   Hyperlipidemia 11/17/2013   Insulin dependent diabetes mellitus 11/17/2013   Essential hypertension 11/17/2013    Allyson Sabal [facility] 01/20/2021, 1:57 PM  [place], [place], [address] Phone: [phone]   Fax:  [phone]  Name: Jose Harvey MRN: [id number] Date of Birth: [birth year]   Manus Gunning, PT 01/20/21 1:57 PM  PHYSICAL THERAPY DISCHARGE SUMMARY  Visits from Start of Care: 5  Current functional level related to goals / functional outcomes: All goals met   Remaining deficits: None   Education / Equipment: Self MLD, compression garments   Patient agrees to discharge. Patient goals were met. Patient is being discharged due to meeting the stated rehab goals.  Allyson Sabal [place], [place] 01/20/21 1:58 PM

## 2021-01-21 ENCOUNTER — Ambulatory Visit (HOSPITAL_COMMUNITY)
Admission: RE | Admit: 2021-01-21 | Discharge: 2021-01-21 | Disposition: A | Discharge status: Home / Self Care | Payer: BC Managed Care – PPO | Source: Ambulatory Visit | Attending: Hematology and Oncology | Admitting: Hematology and Oncology

## 2021-01-21 DIAGNOSIS — Z452 Encounter for adjustment and management of vascular access device: Secondary | ICD-10-CM | POA: Insufficient documentation

## 2021-01-21 DIAGNOSIS — C01 Malignant neoplasm of base of tongue: Secondary | ICD-10-CM | POA: Diagnosis not present

## 2021-01-21 DIAGNOSIS — Z431 Encounter for attention to gastrostomy: Secondary | ICD-10-CM | POA: Insufficient documentation

## 2021-01-21 HISTORY — PX: IR GASTROSTOMY TUBE REMOVAL: IMG5492

## 2021-01-21 HISTORY — PX: IR REMOVAL TUN ACCESS W/ PORT W/O FL MOD SED: IMG2290

## 2021-01-21 MED ORDER — LIDOCAINE-EPINEPHRINE 1 %-1:100000 IJ SOLN
INTRAMUSCULAR | Status: AC
  Administered 2021-01-21: 6 mL via SUBCUTANEOUS
  Filled 2021-01-21: qty 1

## 2021-01-21 MED ORDER — LIDOCAINE VISCOUS HCL 2 % MT SOLN
OROMUCOSAL | Status: AC
  Administered 2021-01-21: 5 mL via GASTROSTOMY
  Filled 2021-01-21: qty 15

## 2021-01-24 ENCOUNTER — Encounter: Payer: BC Managed Care – PPO | Admitting: Physical Therapy

## 2021-01-24 DIAGNOSIS — Z9641 Presence of insulin pump (external) (internal): Secondary | ICD-10-CM | POA: Diagnosis not present

## 2021-01-24 DIAGNOSIS — Z794 Long term (current) use of insulin: Secondary | ICD-10-CM | POA: Diagnosis not present

## 2021-01-24 DIAGNOSIS — E103293 Type 1 diabetes mellitus with mild nonproliferative diabetic retinopathy without macular edema, bilateral: Secondary | ICD-10-CM | POA: Diagnosis not present

## 2021-01-27 ENCOUNTER — Encounter: Payer: Self-pay | Admitting: Physical Therapy

## 2021-01-27 ENCOUNTER — Telehealth (HOSPITAL_COMMUNITY): Payer: Self-pay | Admitting: Dietician

## 2021-01-27 ENCOUNTER — Encounter (HOSPITAL_COMMUNITY): Payer: BC Managed Care – PPO | Admitting: Dietician

## 2021-01-27 NOTE — Telephone Encounter (Signed)
Nutrition Follow-up:  Patient has completed chemoradiation therapy for esophageal cancer.   Spoke with patient via telephone. He reports doing well, reports his PEG was removed last week. He continues to have swallowing difficulties with meats that are not soft or covered in gravy. He has started drinking 2 Fairlife Shakes daily since s/p PEG to increase protein. Patient dropped off food journal for RD review. He has been eating yogurt, corn pops, eggs for breakfast, peaches, mac/cheese, [employer] chili, chicken and dumplings, baked beans, [place] burger with gravy, vienna sausages, cheese puffs, oreos for lunch, baked ziti, fried squash, meatloaf, chicken salad, chicken/beef soup, spaghetti, chicken alfredo, hotdogs with slaw, cabbage, fried potatoes and onions, key lime pie for dinner. Patient is drinking 2% milk, muscle milk, and 2-3 bottles of water, says he does not think to drink fluids unless with meals or thirsty. Patient reports he returned to his job to help out for ~2 weeks, says it was nice to do physical work and boosted his energy. Patient is looking forward to visiting his grandchildren this weekend.   Medications: reviewed  Labs: reviewed  Anthropometrics: Last weight 158 lb 9.6 oz on 8/12 decreased 7 lbs (4.2%) from 165 lb on 6/10. This is insignificant for time frame  5/24 - 165 lb 1.6 oz  4/27 - 168 lb 11.2 oz   NUTRITION DIAGNOSIS: Inadequate energy intake stable   INTERVENTION:  Continue eating high calorie, high protein foods  Encouraged snacks in between meals for added calories and protein to promote weight maintenance. Continue drinking 2 Fairlife Shakes (150 kcal, 30 grams protein) Patient will work to increase fluid intake, recommend 2.5 L/day Encouraged activity as able  Patient has contact information    MONITORING, EVALUATION, GOAL: weight trends, intake   NEXT VISIT: via telephone ~6 weeks

## 2021-01-31 ENCOUNTER — Encounter: Payer: Self-pay | Admitting: Physical Therapy

## 2021-02-03 ENCOUNTER — Encounter: Payer: Self-pay | Admitting: Physical Therapy

## 2021-02-08 DIAGNOSIS — L821 Other seborrheic keratosis: Secondary | ICD-10-CM | POA: Diagnosis not present

## 2021-02-08 DIAGNOSIS — D225 Melanocytic nevi of trunk: Secondary | ICD-10-CM | POA: Diagnosis not present

## 2021-02-08 DIAGNOSIS — D1721 Benign lipomatous neoplasm of skin and subcutaneous tissue of right arm: Secondary | ICD-10-CM | POA: Diagnosis not present

## 2021-02-08 DIAGNOSIS — L570 Actinic keratosis: Secondary | ICD-10-CM | POA: Diagnosis not present

## 2021-02-08 DIAGNOSIS — D2362 Other benign neoplasm of skin of left upper limb, including shoulder: Secondary | ICD-10-CM | POA: Diagnosis not present

## 2021-02-23 ENCOUNTER — Ambulatory Visit (INDEPENDENT_AMBULATORY_CARE_PROVIDER_SITE_OTHER): Payer: BC Managed Care – PPO | Admitting: Otolaryngology

## 2021-02-28 ENCOUNTER — Ambulatory Visit (INDEPENDENT_AMBULATORY_CARE_PROVIDER_SITE_OTHER): Payer: BC Managed Care – PPO | Admitting: Otolaryngology

## 2021-02-28 ENCOUNTER — Other Ambulatory Visit: Payer: Self-pay

## 2021-02-28 DIAGNOSIS — Z85819 Personal history of malignant neoplasm of unspecified site of lip, oral cavity, and pharynx: Secondary | ICD-10-CM

## 2021-02-28 NOTE — Progress Notes (Signed)
HPI: Jose Harvey is a 60 y.o. 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  He completed IMRT therapy in March 2022.  He had a follow-up PET scan 01/11/2021 that was clear.  He is doing well today with minimal complaints.  He is scheduled to follow-up with Dr. Isidore Moos in a couple of months..  Past Medical History:  Diagnosis Date   Adrenal adenoma, left 03/2017   noted on CT   Allergic rhinitis, cause unspecified    Anxiety state, unspecified    Aortic atherosclerosis ([place]) 03/2017   noted on CT   BPV (benign positional vertigo)    COPD (chronic obstructive pulmonary disease) with emphysema ([facility])    mild followed by [facility]    Coronary artery disease    Depressive disorder, not elsewhere classified    Dyspnea    history of   ED (erectile dysfunction)    Gallbladder polyp 03/2016   noted on CT   Hepatic hemangioma 03/2017   noted on CT   History of kidney stones    hx of years ago    Indigestion    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)    Mixed hyperlipidemia    Prostate cancer ([place])    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, not stated as uncontrolled(250.51)    Type I (juvenile type) diabetes mellitus without mention of complication, not stated as uncontrolled    type I - followed by Dr Buddy Duty    Vesicoureteral reflux    Past Surgical History:  Procedure Laterality Date   CHOLECYSTECTOMY N/A 05/17/2016   Procedure: LAPAROSCOPIC CHOLECYSTECTOMY;  Surgeon: Clovis Riley, MD;  [facility];  Service: General;  Laterality: N/A;   cyst removed from throat      dental surgeries      IR GASTROSTOMY TUBE MOD SED  07/07/2020   IR GASTROSTOMY TUBE REMOVAL  01/21/2021   IR IMAGING GUIDED PORT INSERTION  07/07/2020   IR REMOVAL TUN ACCESS W/ PORT W/O FL MOD SED  01/21/2021   LYMPHADENECTOMY Bilateral 07/02/2017   Procedure: BILATERAL LYMPHADENECTOMY;  Surgeon:  Lucas Mallow, MD;  [facility];  Service: Urology;  Laterality: Bilateral;   PROSTATE BIOPSY     ROBOT ASSISTED LAPAROSCOPIC RADICAL PROSTATECTOMY N/A 07/02/2017   Procedure: XI ROBOTIC ASSISTED LAPAROSCOPIC RADICAL PROSTATECTOMY;  Surgeon: Lucas Mallow, MD;  [facility];  Service: Urology;  Laterality: N/A;   TONSILLECTOMY     VIDEO ASSISTED THORACOSCOPY (VATS)/ LOBECTOMY Right 05/08/2019   Procedure: Right VIDEO ASSISTED THORACOSCOPY with   MIDDLE LOBECTOMY and Enbloc portion of Upper Lobe with Node dissection, Intercostal Nerve Block;  Surgeon: Melrose Nakayama, MD;  [facility];  Service: Thoracic;  Laterality: Right;   WISDOM TOOTH EXTRACTION     Social History   Socioeconomic History   Marital status: Divorced    Spouse name: Not on file   Number of children: 2   Years of education: Not on file   Highest education level: Not on file  Occupational History    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 Patient has two grown daughters but neither lives locally.   Social Determinants of Health   Financial Resource Strain: Low Risk    Difficulty of Paying Living Expenses: Not very hard  Food Insecurity: No Food Insecurity   Worried About Charity fundraiser in the Last Year: Never true   Ran Out of Food in the Last Year: Never true  Transportation Needs: No Transportation Needs   Lack of Transportation  (Medical): No   Lack of Transportation (Non-Medical): No  Physical Activity: Not on file  Stress: No Stress Concern Present   Feeling of Stress : Only a little  Social Connections: Unknown   Frequency of Communication with Friends and Family: More than three times a week   Frequency of Social Gatherings with Friends and Family: More than three times a week   Attends Religious Services: More than 4 times per year   Active Member of [employer] or Organizations: Yes   Attends Music therapist: More than 4 times per year   Marital Status: Not on file   Family History  Problem Relation Age of Onset   Diabetes type I Father    Heart attack Father    Hypertension Father    Emphysema Father    CAD Father    Colon cancer Maternal Grandfather    Emphysema Paternal Grandfather    Diabetes type I Paternal Grandfather    Kidney disease Neg Hx    Liver disease Neg Hx    Breast cancer Neg Hx    Prostate cancer Neg Hx    Allergies  Allergen Reactions   Crestor [Rosuvastatin]     Muscle aches in higher dose   Lipitor [Atorvastatin]     Muscle aches   Other Other (See Comments)    Any antihistamines Causes shaking   Zocor [Simvastatin] Other (See Comments)    Muscle aches    Prior to Admission medications   Medication Sig Start Date End Date Taking? Authorizing Provider  aspirin 81 MG tablet Take 1 tablet (81 mg total) by mouth daily. 07/07/17   Filippou, Braxton Feathers, MD  cetirizine (ZYRTEC) 10 MG tablet Take 10 mg by mouth daily.    [provider]  chlorhexidine (PERIDEX) 0.12 % solution Use as directed 15 mLs in the mouth or throat daily.    [provider]  Coenzyme Q10 (COQ10) 100 MG CAPS Take 100 mg by mouth daily.    [provider]  Continuous Blood Gluc Sensor (FREESTYLE LIBRE 14 DAY SENSOR) MISC AS DIRECTED CHANGE SENSOR EVERY 14 DAYS 28 09/26/19   [provider]  ibuprofen (ADVIL) 200 MG tablet Take 400-800 mg by mouth every 6 (six)  hours as needed for moderate pain. Patient not taking: Reported on 06/17/2020    [provider]  Insulin Human (INSULIN PUMP) SOLN Inject into the skin continuous. NOVOLOG 100 UNIT/ML VIAL    [provider]  lidocaine (XYLOCAINE) 2 % solution Patient: Mix 1part 2% viscous lidocaine, 1part H20. Swish & swallow 77mL of diluted mixture, 2min before meals and at bedtime, up  to QID 12/16/20   Eppie Gibson, MD  lidocaine-prilocaine (EMLA) cream Apply to affected area once 06/29/20   Brunetta Genera, MD  lisinopril (PRINIVIL,ZESTRIL) 5 MG tablet Take 5 mg by mouth daily.  09/09/13   [provider]  magic mouthwash w/lidocaine SOLN 38ml oral swish and swallow 4 times daily as needed  1 Part viscous lidocaine 2% 1 Part Maalox 1 Part diphenhydramine 12.5 mg per 5 ml elixir 1 part Nystatin 500000 units per 15ml 09/29/20   Brunetta Genera, MD  Multiple Vitamins-Minerals (ICAPS AREDS 2) CAPS Take 1 capsule by mouth daily.    [provider]  NOVOLOG 100 UNIT/ML injection Averages about 30 units per day via insulin pump 08/09/19   [provider]  Nutritional Supplements (FEEDING SUPPLEMENT, GLUCERNA 1.5 CAL,) LIQD Give 2 cartons at 8am, noon and 4pm. Give 1 carton at 8pm. Flush with 69ml of water before and after.  Drink or give additional water via tube of 282ml 3 times per day.  Meets 100% of needs.  Send bolus supplies. 07/20/20   Eppie Gibson, MD  oxyCODONE (ROXICODONE) 5 MG/5ML solution Take 5 mLs (5 mg total) by mouth every 6 (six) hours as needed for severe pain. 10/04/20   Brunetta Genera, MD  pantoprazole (PROTONIX) 40 MG tablet Take 40 mg by mouth daily.    [provider]  rosuvastatin (CRESTOR) 5 MG tablet Take 1 tablet (5 mg total) by mouth daily. 09/12/16   Dorothy Spark, MD  sertraline (ZOLOFT) 100 MG tablet Take 50 mg by mouth daily. 01/25/16   [provider]     Positive ROS: Otherwise negative  All other systems  have been reviewed and were otherwise negative with the exception of those mentioned in the HPI and as above.  Physical Exam: Constitutional: Alert, well-appearing, no acute distress Ears: External ears without lesions or tenderness. Ear canals are clear bilaterally with intact, clear TMs.  Nasal: External nose without lesions.. Clear nasal passages Oral: Lips and gums without lesions. Tongue and palate mucosa without lesions. Posterior oropharynx clear.  Indirect laryngoscopy revealed a clear base of tongue vallecula and epiglottis.  Vocal cords were clear bilaterally with normal vocal mobility.  Palpation of the base of tongue was soft. Neck: No palpable adenopathy or masses.  Do not appreciate any significant adenopathy on either side of the neck. Respiratory: Breathing comfortably  Skin: No facial/neck lesions or rash noted.  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 Informed patient today that I am retiring and that would recommend follow-up with Dr. Isidore Moos and she could recommend ENT follow-up at that time.   Radene Journey, MD

## 2021-03-15 DIAGNOSIS — M65321 Trigger finger, right index finger: Secondary | ICD-10-CM | POA: Diagnosis not present

## 2021-03-22 DIAGNOSIS — E113299 Type 2 diabetes mellitus with mild nonproliferative diabetic retinopathy without macular edema, unspecified eye: Secondary | ICD-10-CM | POA: Diagnosis not present

## 2021-03-30 ENCOUNTER — Encounter: Payer: BC Managed Care – PPO | Admitting: Dietician

## 2021-03-30 ENCOUNTER — Telehealth: Payer: Self-pay | Admitting: Dietician

## 2021-03-30 NOTE — Telephone Encounter (Signed)
Nutrition Follow-up:  Patient has completed chemoradiation therapy for esophageal cancer. PEG removed 8/19.   Spoke with patient via telephone. He reports doing well, his taste has returned and tolerating regular textures without difficulty with the exception of BBQ. Patient reports he has a good appetite and is maintaining his weight. His energy level has improved and planning to return to work. He will be working along the [employer] over the next month.     NUTRITION DIAGNOSIS: Inadequate energy intake appears improved    INTERVENTION:  Continue eating high calorie, high protein foods for weight maintenance Patient has contact information     MONITORING, EVALUATION, GOAL: weight trends, intake   NEXT VISIT: No follow-up scheduled. Patient will contact with nutrition questions/concerns

## 2021-04-18 DIAGNOSIS — Z794 Long term (current) use of insulin: Secondary | ICD-10-CM | POA: Diagnosis not present

## 2021-04-18 DIAGNOSIS — Z9641 Presence of insulin pump (external) (internal): Secondary | ICD-10-CM | POA: Diagnosis not present

## 2021-04-25 DIAGNOSIS — Z794 Long term (current) use of insulin: Secondary | ICD-10-CM | POA: Diagnosis not present

## 2021-04-25 DIAGNOSIS — Z9641 Presence of insulin pump (external) (internal): Secondary | ICD-10-CM | POA: Diagnosis not present

## 2021-04-25 DIAGNOSIS — E103293 Type 1 diabetes mellitus with mild nonproliferative diabetic retinopathy without macular edema, bilateral: Secondary | ICD-10-CM | POA: Diagnosis not present

## 2021-04-26 DIAGNOSIS — M65321 Trigger finger, right index finger: Secondary | ICD-10-CM | POA: Insufficient documentation

## 2021-04-26 DIAGNOSIS — S63632A Sprain of interphalangeal joint of right middle finger, initial encounter: Secondary | ICD-10-CM | POA: Diagnosis not present

## 2021-05-10 ENCOUNTER — Other Ambulatory Visit: Payer: Self-pay

## 2021-05-10 DIAGNOSIS — C01 Malignant neoplasm of base of tongue: Secondary | ICD-10-CM

## 2021-05-11 ENCOUNTER — Inpatient Hospital Stay: Payer: BC Managed Care – PPO | Attending: Radiation Oncology

## 2021-05-11 ENCOUNTER — Other Ambulatory Visit: Payer: Self-pay

## 2021-05-11 DIAGNOSIS — Z79899 Other long term (current) drug therapy: Secondary | ICD-10-CM | POA: Insufficient documentation

## 2021-05-11 DIAGNOSIS — C01 Malignant neoplasm of base of tongue: Secondary | ICD-10-CM | POA: Insufficient documentation

## 2021-05-11 LAB — CBC WITH DIFFERENTIAL (CANCER CENTER ONLY)
Abs Immature Granulocytes: 0.01 10*3/uL (ref 0.00–0.07)
Basophils Absolute: 0.1 10*3/uL (ref 0.0–0.1)
Basophils Relative: 1 %
Eosinophils Absolute: 0.4 10*3/uL (ref 0.0–0.5)
Eosinophils Relative: 8 %
HCT: 41.6 % (ref 39.0–52.0)
Hemoglobin: 13.8 g/dL (ref 13.0–17.0)
Immature Granulocytes: 0 %
Lymphocytes Relative: 16 %
Lymphs Abs: 0.8 10*3/uL (ref 0.7–4.0)
MCH: 30.1 pg (ref 26.0–34.0)
MCHC: 33.2 g/dL (ref 30.0–36.0)
MCV: 90.8 fL (ref 80.0–100.0)
Monocytes Absolute: 0.6 10*3/uL (ref 0.1–1.0)
Monocytes Relative: 12 %
Neutro Abs: 3 10*3/uL (ref 1.7–7.7)
Neutrophils Relative %: 63 %
Platelet Count: 230 10*3/uL (ref 150–400)
RBC: 4.58 MIL/uL (ref 4.22–5.81)
RDW: 13.3 % (ref 11.5–15.5)
WBC Count: 4.7 10*3/uL (ref 4.0–10.5)
nRBC: 0 % (ref 0.0–0.2)

## 2021-05-11 LAB — CMP (CANCER CENTER ONLY)
ALT: 26 U/L (ref 0–44)
AST: 22 U/L (ref 15–41)
Albumin: 3.8 g/dL (ref 3.5–5.0)
Alkaline Phosphatase: 82 U/L (ref 38–126)
Anion gap: 8 (ref 5–15)
BUN: 25 mg/dL — ABNORMAL HIGH (ref 8–23)
CO2: 26 mmol/L (ref 22–32)
Calcium: 9.1 mg/dL (ref 8.9–10.3)
Chloride: 106 mmol/L (ref 98–111)
Creatinine: 0.9 mg/dL (ref 0.61–1.24)
GFR, Estimated: >60 mL/min (ref >60)
Glucose, Bld: 120 mg/dL — ABNORMAL HIGH (ref 70–99)
Potassium: 4.3 mmol/L (ref 3.5–5.1)
Sodium: 140 mmol/L (ref 135–145)
Total Bilirubin: 0.6 mg/dL (ref 0.3–1.2)
Total Protein: 6.8 g/dL (ref 6.5–8.1)

## 2021-05-11 LAB — MAGNESIUM: Magnesium: 1.8 mg/dL (ref 1.7–2.4)

## 2021-05-11 LAB — LACTATE DEHYDROGENASE: LDH: 129 U/L (ref 98–192)

## 2021-05-13 ENCOUNTER — Ambulatory Visit (HOSPITAL_COMMUNITY)
Admission: RE | Admit: 2021-05-13 | Discharge: 2021-05-13 | Disposition: A | Discharge status: Home / Self Care | Payer: BC Managed Care – PPO | Source: Ambulatory Visit | Attending: Radiation Oncology | Admitting: Radiation Oncology

## 2021-05-13 ENCOUNTER — Other Ambulatory Visit: Payer: Self-pay

## 2021-05-13 DIAGNOSIS — C01 Malignant neoplasm of base of tongue: Secondary | ICD-10-CM | POA: Diagnosis not present

## 2021-05-13 DIAGNOSIS — J439 Emphysema, unspecified: Secondary | ICD-10-CM | POA: Diagnosis not present

## 2021-05-13 DIAGNOSIS — R911 Solitary pulmonary nodule: Secondary | ICD-10-CM | POA: Diagnosis not present

## 2021-05-13 DIAGNOSIS — I700 Atherosclerosis of aorta: Secondary | ICD-10-CM | POA: Diagnosis not present

## 2021-05-13 MED ORDER — IOHEXOL 350 MG/ML SOLN
60.0000 mL | Freq: Once | INTRAVENOUS | Status: AC | PRN
  Administered 2021-05-13: 60 mL via INTRAVENOUS

## 2021-05-17 ENCOUNTER — Ambulatory Visit
Admission: RE | Admit: 2021-05-17 | Discharge: 2021-05-17 | Disposition: A | Discharge status: Home / Self Care | Payer: BC Managed Care – PPO | Source: Ambulatory Visit | Attending: Radiation Oncology | Admitting: Radiation Oncology

## 2021-05-17 ENCOUNTER — Other Ambulatory Visit: Payer: Self-pay

## 2021-05-17 VITALS — BP 127/71 | HR 68 | Temp 96.5°F | Resp 18 | Ht 71.0 in | Wt 159.1 lb

## 2021-05-17 DIAGNOSIS — D1803 Hemangioma of intra-abdominal structures: Secondary | ICD-10-CM | POA: Insufficient documentation

## 2021-05-17 DIAGNOSIS — R911 Solitary pulmonary nodule: Secondary | ICD-10-CM | POA: Diagnosis not present

## 2021-05-17 DIAGNOSIS — C01 Malignant neoplasm of base of tongue: Secondary | ICD-10-CM | POA: Insufficient documentation

## 2021-05-17 DIAGNOSIS — Z87891 Personal history of nicotine dependence: Secondary | ICD-10-CM | POA: Diagnosis not present

## 2021-05-17 DIAGNOSIS — C770 Secondary and unspecified malignant neoplasm of lymph nodes of head, face and neck: Secondary | ICD-10-CM | POA: Diagnosis not present

## 2021-05-17 DIAGNOSIS — J432 Centrilobular emphysema: Secondary | ICD-10-CM | POA: Diagnosis not present

## 2021-05-17 DIAGNOSIS — Z79899 Other long term (current) drug therapy: Secondary | ICD-10-CM | POA: Diagnosis not present

## 2021-05-17 DIAGNOSIS — C61 Malignant neoplasm of prostate: Secondary | ICD-10-CM | POA: Insufficient documentation

## 2021-05-17 DIAGNOSIS — Z08 Encounter for follow-up examination after completed treatment for malignant neoplasm: Secondary | ICD-10-CM | POA: Diagnosis not present

## 2021-05-17 DIAGNOSIS — Z902 Acquired absence of lung [part of]: Secondary | ICD-10-CM | POA: Insufficient documentation

## 2021-05-17 DIAGNOSIS — I700 Atherosclerosis of aorta: Secondary | ICD-10-CM | POA: Diagnosis not present

## 2021-05-17 DIAGNOSIS — I2510 Atherosclerotic heart disease of native coronary artery without angina pectoris: Secondary | ICD-10-CM | POA: Insufficient documentation

## 2021-05-17 DIAGNOSIS — Z9049 Acquired absence of other specified parts of digestive tract: Secondary | ICD-10-CM | POA: Insufficient documentation

## 2021-05-17 MED ORDER — OXYMETAZOLINE HCL 0.05 % NA SOLN
2.0000 | Freq: Once | NASAL | Status: AC
  Administered 2021-05-17: 2 via NASAL
  Filled 2021-05-17: qty 30

## 2021-05-17 NOTE — Progress Notes (Signed)
Mr. Heintzelman presents today for after completing radiation to his base of tongue on 08/20/2020, and to review CT scan results from 05/13/2021  Pain issues, if any: Patient denies Using a feeding tube?: N/A--removed 01/21/21 Weight changes, if any: Reports a healthy appetite Wt Readings from Last 3 Encounters:  05/17/21 159 lb 2 oz (72.2 kg)  01/14/21 158 lb 9.6 oz (71.9 kg)  11/12/20 165 lb (74.8 kg)   Swallowing issues, if any: Overall patient denies. Still avoids drier foods like bread/biscuit, and must have liquid at every meal to help ensure food goes down. Taste continues to slowly return which plays a big part in his appetite/eating habits  Smoking or chewing tobacco? None Using fluoride trays daily? Reports he's brushing with fluoride toothpaste; denies any dental issues/concerns Last ENT visit was on: 02/28/2021 Dr. 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 Informed patient today that I am retiring and that would recommend follow-up with Dr. Isidore Moos and she could recommend ENT follow-up at that time.  Other notable issues, if any: Reports swelling under his chin has resolved since working with PT and wearing compression garment at night. Denies any ROM issues/limitations to his neck. Denies any ear or jaw pain, or difficulty opening his mouth. Overall reports he feels well and is pleased with his continued progress

## 2021-05-18 ENCOUNTER — Encounter: Payer: Self-pay | Admitting: Radiation Oncology

## 2021-05-18 NOTE — Progress Notes (Signed)
Radiation Oncology         [phone] ________________________________  Name: Jose Harvey MRN: [id number]  Date: 05/17/2021  DOB: 06-Dec-1960  Follow-Up Visit Note  CC: Leeroy Cha, MD  Brunetta Genera, MD  Diagnosis and Prior Radiotherapy:       ICD-10-CM   1. Cancer of base of tongue (HCC)  C01 oxymetazoline (AFRIN) 0.05 % nasal spray 2 spray    Fiberoptic laryngoscopy       Cancer Staging  Cancer of base of tongue (HCC) Staging form: Pharynx - HPV-Mediated Oropharynx, AJCC 8th Edition - Clinical stage from 06/11/2020: Stage I (cT2, cN1, cM0, p16+) - Signed by Eppie Gibson, MD on 06/11/2020 Stage prefix: Initial diagnosis  Malignant neoplasm of prostate [facility]) Staging form: Prostate, AJCC 8th Edition - Clinical: Stage IIC (cT2a, cN0, cM0, PSA: 4.1, Grade Group: 3) - Signed by Tyler Pita, MD on 06/06/2017 Prostate specific antigen (PSA) range: Less than 10 Gleason score: 7 Histologic grading system: 5 grade system   CHIEF COMPLAINT:  Here for follow-up and surveillance of throat cancer  Narrative:  Jose Harvey presents today for after completing radiation to his base of tongue on 08/20/2020, and to review CT scan results from 05/13/2021  Pain issues, if any: Patient denies Using a feeding tube?: N/A--removed 01/21/21 Weight changes, if any: Reports a healthy appetite Wt Readings from Last 3 Encounters:  05/17/21 159 lb 2 oz (72.2 kg)  01/14/21 158 lb 9.6 oz (71.9 kg)  11/12/20 165 lb (74.8 kg)   Swallowing issues, if any: Overall patient denies. Still avoids drier foods like bread/biscuit, and must have liquid at every meal to help ensure food goes down. Taste continues to slowly return which plays a big part in his appetite/eating habits  Smoking or chewing tobacco? None Using fluoride trays daily? Reports he's brushing with fluoride toothpaste; denies any dental issues/concerns Last ENT visit was on: 02/28/2021 Dr. 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 Informed patient today that I am retiring and that would recommend follow-up with Dr. Isidore Moos and she could recommend ENT follow-up at that time.  Other notable issues, if any: Reports swelling under his chin has resolved since working with PT and wearing compression garment at night. Denies any ROM issues/limitations to his neck. Denies any ear or jaw pain, or difficulty opening his mouth. Overall reports he feels well and is pleased with his continued progress    ALLERGIES:  is allergic to crestor [rosuvastatin].  Meds: Current Outpatient Medications  Medication Sig Dispense Refill   aspirin 81 MG tablet Take 1 tablet (81 mg total) by mouth daily. 30 tablet    cetirizine (ZYRTEC) 10 MG tablet Take 10 mg by mouth daily.     chlorhexidine (PERIDEX) 0.12 % solution Use as directed 15 mLs in the mouth or throat daily.     Coenzyme Q10 (COQ10) 100 MG CAPS Take 100 mg by mouth daily.     Continuous Blood Gluc Sensor (FREESTYLE LIBRE 14 DAY SENSOR) MISC AS DIRECTED CHANGE SENSOR EVERY 14 DAYS 28     ibuprofen (ADVIL) 200 MG tablet Take 400-800 mg by mouth every 6 (six) hours as needed for moderate pain. (Patient not taking: Reported on 06/17/2020)     Insulin Human (INSULIN PUMP) SOLN Inject into the skin continuous. NOVOLOG 100 UNIT/ML VIAL  lidocaine (XYLOCAINE) 2 % solution Patient: Mix 1part 2% viscous lidocaine, 1part H20. Swish & swallow 97mL of diluted mixture, 13min before meals and at bedtime, up to QID 200 mL 4   lidocaine-prilocaine (EMLA) cream Apply to affected area once 30 g 3   lisinopril (PRINIVIL,ZESTRIL) 5 MG tablet Take 5 mg by mouth daily.      magic mouthwash w/lidocaine SOLN 56ml oral swish and swallow 4 times daily as needed  1 Part viscous lidocaine 2% 1 Part  Maalox 1 Part diphenhydramine 12.5 mg per 5 ml elixir 1 part Nystatin 500000 units per 39ml 200 mL 2   Multiple Vitamins-Minerals (ICAPS AREDS 2) CAPS Take 1 capsule by mouth daily.     NOVOLOG 100 UNIT/ML injection Averages about 30 units per day via insulin pump     Nutritional Supplements (FEEDING SUPPLEMENT, GLUCERNA 1.5 CAL,) LIQD Give 2 cartons at 8am, noon and 4pm. Give 1 carton at 8pm. Flush with 56ml of water before and after.  Drink or give additional water via tube of 230ml 3 times per day.  Meets 100% of needs.  Send bolus supplies. 1659 mL 5   oxyCODONE (ROXICODONE) 5 MG/5ML solution Take 5 mLs (5 mg total) by mouth every 6 (six) hours as needed for severe pain. 200 mL 0   pantoprazole (PROTONIX) 40 MG tablet Take 40 mg by mouth daily.     rosuvastatin (CRESTOR) 5 MG tablet Take 1 tablet (5 mg total) by mouth daily. 90 tablet 3   sertraline (ZOLOFT) 100 MG tablet Take 50 mg by mouth daily.  0   No current facility-administered medications for this encounter.    Physical Findings: The patient is in no acute distress. Patient is alert and oriented. Wt Readings from Last 3 Encounters:  05/17/21 159 lb 2 oz (72.2 kg)  01/14/21 158 lb 9.6 oz (71.9 kg)  11/12/20 165 lb (74.8 kg)    height is 5\' 11"  (1.803 m) and weight is 159 lb 2 oz (72.2 kg). His temporal temperature is 96.5 F (35.8 C) (abnormal). His blood pressure is 127/71 and his pulse is 68. His respiration is 18 and oxygen saturation is 100%. .  See med onc note.  General: Alert and oriented, in no acute distress HEENT: Head is normocephalic. Extraocular movements are intact. Oropharynx is notable for no thrush, no tumor Neck: No palpable adenopathy, mild lymphedema anteriorly Skin: Skin in treatment fields shows satisfactory healing  Psychiatric: Judgment and insight are intact. Affect is appropriate. HEART RRR CHEST CTAB  PROCEDURE NOTE: After obtaining consent and spraying nasal cavity with topical oxymetazoline, the  flexible endoscope was coated with lidocaine gel and introduced and passed through the nasal cavity.  The nasopharynx, oropharynx, hypopharynx, and larynx  were then examined. No lesions appreciated in the mucosal axis. The true cords were symmetrically mobile. He tolerated the procedure well.   Lab Findings: Lab Results  Component Value Date   WBC 4.7 05/11/2021   HGB 13.8 05/11/2021   HCT 41.6 05/11/2021   MCV 90.8 05/11/2021   PLT 230 05/11/2021    Lab Results  Component Value Date   TSH 0.593 01/14/2021    Radiographic Findings: CT Chest W Contrast  Result Date: 05/14/2021 CLINICAL DATA:  60 year old male with history of cancer of the base of tongue. Ground-glass nodule noted in the left upper lobe on prior examinations. Follow-up study. EXAM: CT CHEST WITH CONTRAST TECHNIQUE: Multidetector CT imaging of the chest was performed during intravenous contrast administration. CONTRAST:  70mL OMNIPAQUE IOHEXOL 350 MG/ML SOLN COMPARISON:  Chest CT 05/03/2020. FINDINGS: Cardiovascular: Heart size is normal. There is no significant pericardial fluid, thickening or pericardial calcification. There is aortic atherosclerosis, as well as atherosclerosis of the great vessels of the mediastinum and the coronary arteries, including calcified atherosclerotic plaque in the left main, left anterior descending, left circumflex and right coronary arteries. Mediastinum/Nodes: No pathologically enlarged mediastinal or hilar lymph nodes. Esophagus is unremarkable in appearance. No axillary lymphadenopathy. Lungs/Pleura: Status post wedge resection in the right upper and lower lobes. Status post right middle lobectomy. Previously noted left upper lobe ground-glass attenuation nodule has essentially completely resolved compared to prior studies, indicative of a benign etiology. 3 mm subpleural nodule in the medial aspect of the left upper lobe (axial image 48 of series 5) is stable, considered benign, presumably a  subpleural lymph node. No other larger more suspicious appearing pulmonary nodules or masses are noted. No acute consolidative airspace disease. No pleural effusions. Mild diffuse bronchial wall thickening with mild centrilobular and paraseptal emphysema. Upper Abdomen: Status post cholecystectomy. 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 Musculoskeletal: There are no aggressive appearing lytic or blastic lesions noted in the visualized portions of the skeleton. IMPRESSION: 1. Resolution of previously noted left upper lobe ground-glass attenuation nodule indicative of a benign etiology on the prior study. 2. Otherwise, stable examination demonstrating no evidence to suggest recurrent malignancy in the thorax. 3. Aortic atherosclerosis, in addition to left main and 3 vessel coronary artery disease. Please note that although the presence of coronary artery calcium documents the presence of coronary artery disease, the severity of this disease and any potential stenosis cannot be assessed on this non-gated CT examination. Assessment for potential risk factor modification, dietary therapy or pharmacologic therapy may be warranted, if clinically indicated. 4. Mild diffuse bronchial wall thickening with mild centrilobular and paraseptal emphysema; imaging findings suggestive of underlying COPD. 5. Multiple hepatic hemangiomas again noted. Aortic Atherosclerosis (ICD10-I70.0) and Emphysema (ICD10-J43.9). Electronically Signed   By: Vinnie Langton M.D.   On: 05/14/2021 06:51    Impression/Plan:    1) Head and Neck Cancer Status: in remission, doing well.   NED  2) Nutritional Status: denies  any issues, PEG removed Wt Readings from Last 3 Encounters:  05/17/21 159 lb 2 oz (72.2 kg)  01/14/21 158 lb 9.6 oz (71.9 kg)  11/12/20 165 lb (74.8 kg)   3)  Swallowing: Continue speech-language pathology exercises  4) Dental: Encouraged to continue regular followup with dentistry, and dental hygiene including fluoride rinses and or Rx toothpaste for life.  Advised q 59mo cleanings ideally, for life.  5) Thyroid function: Check annually in med onc - patient informed he can also address TSH labwork w/ PCP. Knows to check this at least annually due to risk of hypothyroidism from neck RT.   Lab Results  Component Value Date   TSH 0.593 01/14/2021    6)   CT chest without contrast results reviewed. He will talk to his PCP about atherosclerosis management. NED from an oncology standpoint.  Anderson Malta RN will arrange follow-up with ENT (Dr Fredric Dine or Dr Constance Holster) in 64mo. He will continue to see Dr. Lorenso Courier here at the [facility]. Given that he is doing  so well symptomatically, I will see him back PRN. He is please will this plan.   On date of service, in total, I spent 30 minutes on this encounter. Patient was seen in person. _____________________________________   Eppie Gibson, MD

## 2021-05-20 NOTE — Progress Notes (Signed)
Oncology Nurse Navigator Documentation   Per patient's 05/17/21 post-treatment follow-up with Dr. Isidore Moos, sent fax to [facility] ENT Scheduling with request Jose Harvey be contacted and scheduled for routine post-RT follow-up in 3 months due to his previous ENT's recent retirement. Jose Harvey has seen Dr. Redmond Baseman at [facility]NT in the past but asks to be scheduled with a different provider. I have requested he be seen by Dr. Constance Holster or Dr. Fredric Dine. Notification of successful fax transmission received.   Harlow Asa RN, BSN, OCN Head & Neck Oncology Nurse [place] at [facility] Phone # [phone]  Fax # [phone]

## 2021-06-29 DIAGNOSIS — Z0000 Encounter for general adult medical examination without abnormal findings: Secondary | ICD-10-CM | POA: Diagnosis not present

## 2021-06-29 DIAGNOSIS — C61 Malignant neoplasm of prostate: Secondary | ICD-10-CM | POA: Diagnosis not present

## 2021-08-01 ENCOUNTER — Encounter: Payer: Self-pay | Admitting: Hematology

## 2021-09-05 ENCOUNTER — Telehealth: Payer: Self-pay | Admitting: Hematology

## 2021-09-05 NOTE — Telephone Encounter (Signed)
Rescheduled upcoming appointment due to provider's PAL. Patient is aware of changes. ?

## 2021-09-16 ENCOUNTER — Other Ambulatory Visit: Payer: BC Managed Care – PPO

## 2021-09-16 ENCOUNTER — Ambulatory Visit: Payer: BC Managed Care – PPO | Admitting: Hematology

## 2021-10-11 ENCOUNTER — Other Ambulatory Visit: Payer: Self-pay

## 2021-10-11 DIAGNOSIS — C01 Malignant neoplasm of base of tongue: Secondary | ICD-10-CM

## 2021-10-18 DIAGNOSIS — L598 Other specified disorders of the skin and subcutaneous tissue related to radiation: Secondary | ICD-10-CM | POA: Diagnosis not present

## 2021-10-18 DIAGNOSIS — Z87891 Personal history of nicotine dependence: Secondary | ICD-10-CM | POA: Diagnosis not present

## 2021-10-18 DIAGNOSIS — Z9221 Personal history of antineoplastic chemotherapy: Secondary | ICD-10-CM | POA: Diagnosis not present

## 2021-10-18 DIAGNOSIS — Z85818 Personal history of malignant neoplasm of other sites of lip, oral cavity, and pharynx: Secondary | ICD-10-CM | POA: Diagnosis not present

## 2021-10-18 DIAGNOSIS — Z923 Personal history of irradiation: Secondary | ICD-10-CM | POA: Insufficient documentation

## 2021-10-19 ENCOUNTER — Other Ambulatory Visit: Payer: Self-pay

## 2021-10-19 ENCOUNTER — Inpatient Hospital Stay (HOSPITAL_BASED_OUTPATIENT_CLINIC_OR_DEPARTMENT_OTHER): Payer: BC Managed Care – PPO | Admitting: Hematology

## 2021-10-19 ENCOUNTER — Inpatient Hospital Stay: Payer: BC Managed Care – PPO | Attending: Hematology

## 2021-10-19 VITALS — BP 117/64 | HR 65 | Temp 97.7°F | Resp 20 | Wt 167.3 lb

## 2021-10-19 DIAGNOSIS — Z8249 Family history of ischemic heart disease and other diseases of the circulatory system: Secondary | ICD-10-CM | POA: Insufficient documentation

## 2021-10-19 DIAGNOSIS — Z800 Family history of malignant neoplasm of digestive organs: Secondary | ICD-10-CM | POA: Insufficient documentation

## 2021-10-19 DIAGNOSIS — C770 Secondary and unspecified malignant neoplasm of lymph nodes of head, face and neck: Secondary | ICD-10-CM | POA: Insufficient documentation

## 2021-10-19 DIAGNOSIS — C01 Malignant neoplasm of base of tongue: Secondary | ICD-10-CM | POA: Insufficient documentation

## 2021-10-19 DIAGNOSIS — Z833 Family history of diabetes mellitus: Secondary | ICD-10-CM | POA: Insufficient documentation

## 2021-10-19 DIAGNOSIS — Z8546 Personal history of malignant neoplasm of prostate: Secondary | ICD-10-CM | POA: Diagnosis not present

## 2021-10-19 DIAGNOSIS — Z87891 Personal history of nicotine dependence: Secondary | ICD-10-CM | POA: Insufficient documentation

## 2021-10-19 DIAGNOSIS — C884 Extranodal marginal zone B-cell lymphoma of mucosa-associated lymphoid tissue [MALT-lymphoma]: Secondary | ICD-10-CM | POA: Diagnosis not present

## 2021-10-19 DIAGNOSIS — Z836 Family history of other diseases of the respiratory system: Secondary | ICD-10-CM | POA: Insufficient documentation

## 2021-10-19 DIAGNOSIS — A630 Anogenital (venereal) warts: Secondary | ICD-10-CM | POA: Diagnosis not present

## 2021-10-19 DIAGNOSIS — Z79899 Other long term (current) drug therapy: Secondary | ICD-10-CM | POA: Insufficient documentation

## 2021-10-19 LAB — CMP (CANCER CENTER ONLY)
ALT: 17 U/L (ref 0–44)
AST: 20 U/L (ref 15–41)
Albumin: 3.9 g/dL (ref 3.5–5.0)
Alkaline Phosphatase: 80 U/L (ref 38–126)
Anion gap: 4 — ABNORMAL LOW (ref 5–15)
BUN: 23 mg/dL (ref 8–23)
CO2: 30 mmol/L (ref 22–32)
Calcium: 9.1 mg/dL (ref 8.9–10.3)
Chloride: 105 mmol/L (ref 98–111)
Creatinine: 0.99 mg/dL (ref 0.61–1.24)
GFR, Estimated: >60 mL/min (ref >60)
Glucose, Bld: 146 mg/dL — ABNORMAL HIGH (ref 70–99)
Potassium: 3.9 mmol/L (ref 3.5–5.1)
Sodium: 139 mmol/L (ref 135–145)
Total Bilirubin: 0.5 mg/dL (ref 0.3–1.2)
Total Protein: 6.7 g/dL (ref 6.5–8.1)

## 2021-10-19 LAB — CBC WITH DIFFERENTIAL (CANCER CENTER ONLY)
Abs Immature Granulocytes: 0.01 10*3/uL (ref 0.00–0.07)
Basophils Absolute: 0.1 10*3/uL (ref 0.0–0.1)
Basophils Relative: 1 %
Eosinophils Absolute: 0.4 10*3/uL (ref 0.0–0.5)
Eosinophils Relative: 8 %
HCT: 41.1 % (ref 39.0–52.0)
Hemoglobin: 13.9 g/dL (ref 13.0–17.0)
Immature Granulocytes: 0 %
Lymphocytes Relative: 17 %
Lymphs Abs: 0.8 10*3/uL (ref 0.7–4.0)
MCH: 30.4 pg (ref 26.0–34.0)
MCHC: 33.8 g/dL (ref 30.0–36.0)
MCV: 89.9 fL (ref 80.0–100.0)
Monocytes Absolute: 0.5 10*3/uL (ref 0.1–1.0)
Monocytes Relative: 12 %
Neutro Abs: 2.9 10*3/uL (ref 1.7–7.7)
Neutrophils Relative %: 62 %
Platelet Count: 216 10*3/uL (ref 150–400)
RBC: 4.57 MIL/uL (ref 4.22–5.81)
RDW: 13.1 % (ref 11.5–15.5)
WBC Count: 4.7 10*3/uL (ref 4.0–10.5)
nRBC: 0 % (ref 0.0–0.2)

## 2021-10-19 LAB — MAGNESIUM: Magnesium: 2 mg/dL (ref 1.7–2.4)

## 2021-10-19 LAB — LACTATE DEHYDROGENASE: LDH: 119 U/L (ref 98–192)

## 2021-10-19 NOTE — Progress Notes (Signed)
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 F/u  for Pulmonary extranodal MALT Lymphoma  HISTORY OF PRESENTING ILLNESS:  Please see previous note for details on initial presentation  INTERVAL HISTORY:   Jose Harvey is a wonderful 61 y.o. male who is here for evaluation and management of head and neck squamous cell carcinoma. [name] last visit with [place] was on 09/29/2020. He reports He is doing well with no new symptoms or concerns.  He notes that drier foods can get stuck around the tissue from previous oral surgical site.  No significant issues with dry mouth. No thrush. No breathing issues. No other new or acute focal symptoms.  Advised to continue to follow up with Dr. Constance Holster for continued monitoring.  Lab results today 10/19/2021 of CBC w/diff and CMP is as follows: all values are WNL except for a slightly elevated bld glucose at 146. Magnesium of 2.0. LDH of 119.  MEDICAL HISTORY:  Past Medical History:  Diagnosis Date   Adrenal adenoma, left 03/2017   noted on CT   Allergic rhinitis, cause unspecified    Anxiety state, unspecified    Aortic atherosclerosis ([place]) 03/2017   noted on CT   BPV (benign positional vertigo)    COPD (chronic obstructive pulmonary disease) with emphysema (HCC)    mild followed by [facility]    Coronary artery disease    Depressive disorder, not elsewhere classified    Dyspnea    history  of   ED (erectile dysfunction)    Gallbladder polyp 03/2016   noted on CT   Hepatic hemangioma 03/2017   noted on CT   History of kidney stones    hx of years ago    Indigestion    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)    Mixed hyperlipidemia    Prostate cancer ([place])    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, not stated as uncontrolled(250.51)    Type I (juvenile type) diabetes mellitus without mention of complication, not stated as uncontrolled    type I - followed by Dr Buddy Duty    Vesicoureteral reflux     SURGICAL HISTORY: Past Surgical History:  Procedure Laterality Date   CHOLECYSTECTOMY N/A 05/17/2016   Procedure: LAPAROSCOPIC CHOLECYSTECTOMY;  Surgeon: Clovis Riley, MD;  Location: [facility];  Service: General;  Laterality: N/A;   cyst removed from throat      dental surgeries      IR GASTROSTOMY TUBE MOD SED  07/07/2020   IR GASTROSTOMY TUBE REMOVAL  01/21/2021   IR IMAGING GUIDED PORT INSERTION  07/07/2020   IR REMOVAL TUN ACCESS W/ PORT W/O FL MOD SED  01/21/2021   LYMPHADENECTOMY Bilateral 07/02/2017   Procedure: BILATERAL LYMPHADENECTOMY;  Surgeon: Lucas Mallow, MD;  Location: [facility];  Service: Urology;  Laterality: Bilateral;   PROSTATE BIOPSY     ROBOT ASSISTED LAPAROSCOPIC RADICAL PROSTATECTOMY N/A 07/02/2017   Procedure: XI ROBOTIC ASSISTED LAPAROSCOPIC RADICAL PROSTATECTOMY;  Surgeon: Lucas Mallow, MD;  Location: [facility];  Service: Urology;  Laterality: N/A;   TONSILLECTOMY     VIDEO ASSISTED THORACOSCOPY (VATS)/ LOBECTOMY Right 05/08/2019   Procedure: Right VIDEO ASSISTED THORACOSCOPY with   MIDDLE LOBECTOMY and Enbloc portion of Upper Lobe with Node dissection, Intercostal Nerve Block;  Surgeon: Melrose Nakayama, MD;  Location: [facility];  Service: Thoracic;  Laterality: Right;   WISDOM TOOTH EXTRACTION      SOCIAL HISTORY: Social History   Socioeconomic History   Marital status: Single    Spouse name: Not  on file   Number of children: 2   Years of education: Not on file   Highest education level: Not on file  Occupational History    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 Patient has two grown daughters but neither lives locally.   Social Determinants of Health   Financial Resource Strain: Not on file  Food Insecurity: Not on file  Transportation Needs: Not on file  Physical Activity: Not on file  Stress: Not on file  Social Connections: Not on file  Intimate Partner Violence: Not on file    FAMILY HISTORY: Family History  Problem Relation Age of Onset   Diabetes type I Father    Heart attack Father    Hypertension Father    Emphysema Father    CAD Father    Colon cancer Maternal Grandfather    Emphysema Paternal Grandfather    Diabetes type I Paternal Grandfather    Kidney disease Neg Hx    Liver disease Neg Hx    Breast cancer Neg Hx    Prostate cancer Neg Hx     ALLERGIES:  is allergic to crestor [rosuvastatin].  MEDICATIONS:  Current Outpatient Medications  Medication Sig Dispense Refill   aspirin 81 MG tablet Take 1 tablet (81 mg total) by mouth daily. 30 tablet    cetirizine (ZYRTEC) 10 MG tablet Take 10 mg by mouth daily.     chlorhexidine (PERIDEX) 0.12 % solution Use as directed 15 mLs in the mouth or throat daily.     Coenzyme Q10 (COQ10) 100 MG CAPS Take 100 mg by mouth daily.     Continuous Blood Gluc  Sensor (FREESTYLE LIBRE 14 DAY SENSOR) MISC AS DIRECTED CHANGE SENSOR EVERY 14 DAYS 28     ibuprofen (ADVIL) 200 MG tablet Take 400-800 mg by mouth every 6 (six) hours as needed for moderate pain. (Patient not taking: Reported on 06/17/2020)     Insulin Human (INSULIN PUMP) SOLN Inject into the skin continuous. NOVOLOG 100 UNIT/ML VIAL     lidocaine (XYLOCAINE) 2 % solution Patient: Mix 1part 2% viscous lidocaine, 1part H20. Swish & swallow 22m of diluted mixture, 338m before meals and at bedtime, up to QID 200 mL 4   lidocaine-prilocaine (EMLA) cream Apply to affected  area once 30 g 3   lisinopril (PRINIVIL,ZESTRIL) 5 MG tablet Take 5 mg by mouth daily.      magic mouthwash w/lidocaine SOLN 69m oral swish and swallow 4 times daily as needed  1 Part viscous lidocaine 2% 1 Part Maalox 1 Part diphenhydramine 12.5 mg per 5 ml elixir 1 part Nystatin 500000 units per 540m200 mL 2   Multiple Vitamins-Minerals (ICAPS AREDS 2) CAPS Take 1 capsule by mouth daily.     NOVOLOG 100 UNIT/ML injection Averages about 30 units per day via insulin pump     Nutritional Supplements (FEEDING SUPPLEMENT, GLUCERNA 1.5 CAL,) LIQD Give 2 cartons at 8am, noon and 4pm. Give 1 carton at 8pm. Flush with 602mf water before and after.  Drink or give additional water via tube of 240m69mtimes per day.  Meets 100% of needs.  Send bolus supplies. 1659 mL 5   oxyCODONE (ROXICODONE) 5 MG/5ML solution Take 5 mLs (5 mg total) by mouth every 6 (six) hours as needed for severe pain. 200 mL 0   pantoprazole (PROTONIX) 40 MG tablet Take 40 mg by mouth daily.     rosuvastatin (CRESTOR) 5 MG tablet Take 1 tablet (5 mg total) by mouth daily. 90 tablet 3   sertraline (ZOLOFT) 100 MG tablet Take 50 mg by mouth daily.  0   No current facility-administered medications for this visit.    REVIEW OF SYSTEMS:   10 Point review of Systems was done is negative except as noted above.   PHYSICAL EXAMINATION: ECOG PERFORMANCE STATUS: 0 -  Asymptomatic  Vitals:   10/19/21 0857  BP: 117/64  Pulse: 65  Resp: 20  Temp: 97.7 F (36.5 C)  SpO2: 100%   NAD GENERAL:alert, in no acute distress and comfortable SKIN: no acute rashes, no significant lesions EYES: conjunctiva are pink and non-injected, sclera anicteric NECK: supple, no JVD LYMPH:  no palpable lymphadenopathy in the cervical, axillary or inguinal regions LUNGS: clear to auscultation b/l with normal respiratory effort HEART: regular rate & rhythm ABDOMEN:  normoactive bowel sounds , non tender, not distended. No palpable hepatosplenoemgaly. Extremity: no pedal edema PSYCH: alert & oriented x 3 with fluent speech NEURO: no focal motor/sensory deficits  LABORATORY DATA:  I have reviewed the data as listed  .    Latest Ref Rng & Units 10/19/2021    8:20 AM 05/11/2021   10:02 AM 01/14/2021   12:41 PM  CBC  WBC 4.0 - 10.5 K/uL 4.7   4.7   7.7    Hemoglobin 13.0 - 17.0 g/dL 13.9   13.8   13.4    Hematocrit 39.0 - 52.0 % 41.1   41.6   39.7    Platelets 150 - 400 K/uL 216   230   219      .    Latest Ref Rng & Units 10/19/2021    8:20 AM 05/11/2021   10:02 AM 01/14/2021   12:41 PM  CMP  Glucose 70 - 99 mg/dL 146   120   232    BUN 8 - 23 mg/dL 23   25   32    Creatinine 0.61 - 1.24 mg/dL 0.99   0.90   0.84    Sodium 135 - 145 mmol/L 139   140   136    Potassium 3.5 - 5.1 mmol/L 3.9   4.3   4.7    Chloride 98 - 111 mmol/L 105   106   104  CO2 22 - 32 mmol/L '30   26   24    '$ Calcium 8.9 - 10.3 mg/dL 9.1   9.1   9.0    Total Protein 6.5 - 8.1 g/dL 6.7   6.8   6.7    Total Bilirubin 0.3 - 1.2 mg/dL 0.5   0.6   0.3    Alkaline Phos 38 - 126 U/L 80   82   100    AST 15 - 41 U/L '20   22   29    '$ ALT 0 - 44 U/L 17   26   39        05/26/2020 Right Cervical LN Bx ([id number]):   05/08/2019 Surgical Pathology [id number]):    05/08/2019 Surgical Pathology [id number]):    RADIOGRAPHIC STUDIES: I have personally reviewed the radiological  images as listed and agreed with the findings in the report. No results found.  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 Patient is appropriate to pursue-Fourth weekly dose of carboplatin Taxol. Ordered use of sucralfate to help with his radiation mucositis  -Counseled extensively on continued smoking cessation and avoiding alcohol use. -Continue to optimize nutrition through feeding tube. -Notes that he is trying to stay well-hydrated.   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 Masslike area of ground-glass straddles the minor fissure with septal thickening and fissural retraction. While the lesion has increased in size minimally from 09/30/2018, there is clear enlargement from baseline examination on 02/16/2016. Therefore, lesion is characterized as worrisome for adenocarcinoma, Lung-RADS 4B, suspicious. -02/25/2016 PET/CT ([id number]) revealed "Dominant 8 mm right lower lobe pulmonary nodule shows no significant FDG uptake.  Other scattered less than 5 mm bilateral pulmonary nodules also show no FDG uptake but are too small to characterize by PET. Right middle lobe ground-glass opacity shows low-grade FDG uptake, and differential diagnosis includes inflammatory or infectious processes and low-grade adenocarcinoma. Multiple low-attenuation liver masses show no hypermetabolic activity compared to normal hepatic parenchyma. These remain indeterminate and cannot be characterized without IV contrast. Abdomen MRI without and with contrast recommended for further evaluation. 2.5 cm left adrenal mass shows mild FDG uptake, and remains indeterminate. Differential diagnosis includes atypical adenoma, metastasis, pheochromocytoma, or less likely adrenal cortical carcinoma."  PLAN: -Discussed pt labwork today, 10/19/2021; blood counts stable. Magnesium normal, LDH normal. Chemistries stable. -Continue to exercise swallowing function by drinking water daily. -Continue Vitamin B Complex daily. -Continue f/u with PCP to optimize DM2 management. - no lab or clinical evidence of recurrence/progression of head and neck Squamous cell carcinoma or Pulmonary MALT lymphoma. -continue f/u with ENT every 6 months -Scheduled  CT chest WO contrast in 11 months. -Follow up with labs in 12 months.   FOLLOW UP: CT chest WO contrast in 11 months RTC with Dr Irene Limbo with labs in 12 months  .The total time spent in the appointment was 20 minutes* .  All of the patient's questions were answered with apparent satisfaction. The patient knows to call the clinic with any problems, questions or concerns.   Sullivan Lone MD MS AAHIVMS [facility], [facility] Hematology/Oncology Physician [facility]  .*Total Encounter Time as defined by the Centers for Medicare and Medicaid Services includes, in addition to the face-to-face time of a patient visit (documented in the note above) non-face-to-face time: obtaining and reviewing outside history, ordering and  reviewing medications, tests or procedures, care coordination (communications with other health care professionals or caregivers) and documentation in the medical record.   I, Melene Muller, am acting as scribe for Dr. Sullivan Lone, MD.  .I have reviewed the above documentation for accuracy and completeness, and I agree with the above. Brunetta Genera MD

## 2021-10-21 ENCOUNTER — Telehealth: Payer: Self-pay | Admitting: Hematology

## 2021-10-21 NOTE — Telephone Encounter (Signed)
Scheduled follow-up appointment per 5/17 los. Patient is aware.

## 2021-10-24 ENCOUNTER — Encounter: Payer: Self-pay | Admitting: Hematology

## 2021-11-09 ENCOUNTER — Other Ambulatory Visit: Payer: Self-pay | Admitting: Oncology

## 2021-12-28 DIAGNOSIS — M65321 Trigger finger, right index finger: Secondary | ICD-10-CM | POA: Diagnosis not present

## 2021-12-28 DIAGNOSIS — M65322 Trigger finger, left index finger: Secondary | ICD-10-CM | POA: Diagnosis not present

## 2021-12-29 DIAGNOSIS — E103293 Type 1 diabetes mellitus with mild nonproliferative diabetic retinopathy without macular edema, bilateral: Secondary | ICD-10-CM | POA: Diagnosis not present

## 2021-12-29 DIAGNOSIS — Z794 Long term (current) use of insulin: Secondary | ICD-10-CM | POA: Diagnosis not present

## 2022-01-18 DIAGNOSIS — E103293 Type 1 diabetes mellitus with mild nonproliferative diabetic retinopathy without macular edema, bilateral: Secondary | ICD-10-CM | POA: Diagnosis not present

## 2022-01-18 DIAGNOSIS — J449 Chronic obstructive pulmonary disease, unspecified: Secondary | ICD-10-CM | POA: Diagnosis not present

## 2022-01-18 DIAGNOSIS — Z794 Long term (current) use of insulin: Secondary | ICD-10-CM | POA: Diagnosis not present

## 2022-01-18 DIAGNOSIS — B001 Herpesviral vesicular dermatitis: Secondary | ICD-10-CM | POA: Diagnosis not present

## 2022-01-18 DIAGNOSIS — F3341 Major depressive disorder, recurrent, in partial remission: Secondary | ICD-10-CM | POA: Diagnosis not present

## 2022-01-19 DIAGNOSIS — Z923 Personal history of irradiation: Secondary | ICD-10-CM | POA: Diagnosis not present

## 2022-01-19 DIAGNOSIS — C109 Malignant neoplasm of oropharynx, unspecified: Secondary | ICD-10-CM | POA: Diagnosis not present

## 2022-02-09 DIAGNOSIS — L905 Scar conditions and fibrosis of skin: Secondary | ICD-10-CM | POA: Diagnosis not present

## 2022-02-09 DIAGNOSIS — L570 Actinic keratosis: Secondary | ICD-10-CM | POA: Diagnosis not present

## 2022-02-09 DIAGNOSIS — D2272 Melanocytic nevi of left lower limb, including hip: Secondary | ICD-10-CM | POA: Diagnosis not present

## 2022-02-09 DIAGNOSIS — L821 Other seborrheic keratosis: Secondary | ICD-10-CM | POA: Diagnosis not present

## 2022-02-09 DIAGNOSIS — D225 Melanocytic nevi of trunk: Secondary | ICD-10-CM | POA: Diagnosis not present

## 2022-03-29 DIAGNOSIS — Z9641 Presence of insulin pump (external) (internal): Secondary | ICD-10-CM | POA: Diagnosis not present

## 2022-03-29 DIAGNOSIS — Z794 Long term (current) use of insulin: Secondary | ICD-10-CM | POA: Diagnosis not present

## 2022-04-21 DIAGNOSIS — H66002 Acute suppurative otitis media without spontaneous rupture of ear drum, left ear: Secondary | ICD-10-CM | POA: Diagnosis not present

## 2022-04-21 DIAGNOSIS — Z6823 Body mass index (BMI) 23.0-23.9, adult: Secondary | ICD-10-CM | POA: Diagnosis not present

## 2022-04-21 DIAGNOSIS — R051 Acute cough: Secondary | ICD-10-CM | POA: Diagnosis not present

## 2022-04-21 DIAGNOSIS — I10 Essential (primary) hypertension: Secondary | ICD-10-CM | POA: Diagnosis not present

## 2022-04-25 DIAGNOSIS — Z923 Personal history of irradiation: Secondary | ICD-10-CM | POA: Diagnosis not present

## 2022-04-25 DIAGNOSIS — C109 Malignant neoplasm of oropharynx, unspecified: Secondary | ICD-10-CM | POA: Diagnosis not present

## 2022-06-20 DIAGNOSIS — R059 Cough, unspecified: Secondary | ICD-10-CM | POA: Diagnosis not present

## 2022-06-20 DIAGNOSIS — U071 COVID-19: Secondary | ICD-10-CM | POA: Diagnosis not present

## 2022-06-20 DIAGNOSIS — Z6823 Body mass index (BMI) 23.0-23.9, adult: Secondary | ICD-10-CM | POA: Diagnosis not present

## 2022-07-31 DIAGNOSIS — Z125 Encounter for screening for malignant neoplasm of prostate: Secondary | ICD-10-CM | POA: Diagnosis not present

## 2022-07-31 DIAGNOSIS — Z0000 Encounter for general adult medical examination without abnormal findings: Secondary | ICD-10-CM | POA: Diagnosis not present

## 2022-08-03 DIAGNOSIS — C109 Malignant neoplasm of oropharynx, unspecified: Secondary | ICD-10-CM | POA: Diagnosis not present

## 2022-08-03 DIAGNOSIS — Z923 Personal history of irradiation: Secondary | ICD-10-CM | POA: Diagnosis not present

## 2022-08-09 DIAGNOSIS — Z794 Long term (current) use of insulin: Secondary | ICD-10-CM | POA: Diagnosis not present

## 2022-08-09 DIAGNOSIS — E1065 Type 1 diabetes mellitus with hyperglycemia: Secondary | ICD-10-CM | POA: Diagnosis not present

## 2022-08-09 DIAGNOSIS — E103293 Type 1 diabetes mellitus with mild nonproliferative diabetic retinopathy without macular edema, bilateral: Secondary | ICD-10-CM | POA: Diagnosis not present

## 2022-08-09 DIAGNOSIS — Z9641 Presence of insulin pump (external) (internal): Secondary | ICD-10-CM | POA: Diagnosis not present

## 2022-09-29 ENCOUNTER — Other Ambulatory Visit: Payer: Self-pay | Admitting: Hematology

## 2022-09-29 DIAGNOSIS — C01 Malignant neoplasm of base of tongue: Secondary | ICD-10-CM

## 2022-09-29 DIAGNOSIS — C884 Extranodal marginal zone B-cell lymphoma of mucosa-associated lymphoid tissue [MALT-lymphoma]: Secondary | ICD-10-CM

## 2022-09-29 NOTE — Progress Notes (Signed)
CT chest without contrast ordered.

## 2022-10-10 DIAGNOSIS — Z6823 Body mass index (BMI) 23.0-23.9, adult: Secondary | ICD-10-CM | POA: Diagnosis not present

## 2022-10-10 DIAGNOSIS — J0100 Acute maxillary sinusitis, unspecified: Secondary | ICD-10-CM | POA: Diagnosis not present

## 2022-10-10 DIAGNOSIS — R051 Acute cough: Secondary | ICD-10-CM | POA: Diagnosis not present

## 2022-10-15 DIAGNOSIS — Z6823 Body mass index (BMI) 23.0-23.9, adult: Secondary | ICD-10-CM | POA: Diagnosis not present

## 2022-10-15 DIAGNOSIS — Z789 Other specified health status: Secondary | ICD-10-CM | POA: Diagnosis not present

## 2022-10-15 DIAGNOSIS — R062 Wheezing: Secondary | ICD-10-CM | POA: Diagnosis not present

## 2022-10-15 DIAGNOSIS — R0982 Postnasal drip: Secondary | ICD-10-CM | POA: Diagnosis not present

## 2022-10-15 DIAGNOSIS — R0789 Other chest pain: Secondary | ICD-10-CM | POA: Diagnosis not present

## 2022-10-21 ENCOUNTER — Ambulatory Visit (HOSPITAL_BASED_OUTPATIENT_CLINIC_OR_DEPARTMENT_OTHER)
Admission: RE | Admit: 2022-10-21 | Discharge: 2022-10-21 | Disposition: A | Discharge status: Home / Self Care | Payer: 59 | Source: Ambulatory Visit | Attending: Hematology | Admitting: Hematology

## 2022-10-21 DIAGNOSIS — C140 Malignant neoplasm of pharynx, unspecified: Secondary | ICD-10-CM | POA: Diagnosis not present

## 2022-10-21 DIAGNOSIS — J439 Emphysema, unspecified: Secondary | ICD-10-CM | POA: Diagnosis not present

## 2022-10-21 DIAGNOSIS — C884 Extranodal marginal zone B-cell lymphoma of mucosa-associated lymphoid tissue [MALT-lymphoma]: Secondary | ICD-10-CM | POA: Diagnosis not present

## 2022-10-21 DIAGNOSIS — C01 Malignant neoplasm of base of tongue: Secondary | ICD-10-CM | POA: Diagnosis not present

## 2022-10-24 ENCOUNTER — Other Ambulatory Visit: Payer: Self-pay

## 2022-10-24 DIAGNOSIS — C884 Extranodal marginal zone B-cell lymphoma of mucosa-associated lymphoid tissue [MALT-lymphoma]: Secondary | ICD-10-CM

## 2022-10-25 ENCOUNTER — Inpatient Hospital Stay: Payer: 59

## 2022-10-25 ENCOUNTER — Inpatient Hospital Stay: Payer: 59 | Attending: Hematology | Admitting: Hematology

## 2022-10-25 VITALS — BP 124/77 | HR 66 | Temp 97.5°F | Resp 20 | Wt 171.5 lb

## 2022-10-25 DIAGNOSIS — Z87442 Personal history of urinary calculi: Secondary | ICD-10-CM | POA: Diagnosis not present

## 2022-10-25 DIAGNOSIS — Z79899 Other long term (current) drug therapy: Secondary | ICD-10-CM | POA: Diagnosis not present

## 2022-10-25 DIAGNOSIS — Z800 Family history of malignant neoplasm of digestive organs: Secondary | ICD-10-CM | POA: Diagnosis not present

## 2022-10-25 DIAGNOSIS — Z825 Family history of asthma and other chronic lower respiratory diseases: Secondary | ICD-10-CM | POA: Diagnosis not present

## 2022-10-25 DIAGNOSIS — C770 Secondary and unspecified malignant neoplasm of lymph nodes of head, face and neck: Secondary | ICD-10-CM | POA: Insufficient documentation

## 2022-10-25 DIAGNOSIS — Z833 Family history of diabetes mellitus: Secondary | ICD-10-CM | POA: Diagnosis not present

## 2022-10-25 DIAGNOSIS — C01 Malignant neoplasm of base of tongue: Secondary | ICD-10-CM | POA: Diagnosis not present

## 2022-10-25 DIAGNOSIS — C884 Extranodal marginal zone B-cell lymphoma of mucosa-associated lymphoid tissue [MALT-lymphoma]: Secondary | ICD-10-CM | POA: Diagnosis not present

## 2022-10-25 DIAGNOSIS — Z7289 Other problems related to lifestyle: Secondary | ICD-10-CM | POA: Diagnosis not present

## 2022-10-25 DIAGNOSIS — Z8249 Family history of ischemic heart disease and other diseases of the circulatory system: Secondary | ICD-10-CM | POA: Diagnosis not present

## 2022-10-25 DIAGNOSIS — Z87891 Personal history of nicotine dependence: Secondary | ICD-10-CM | POA: Insufficient documentation

## 2022-10-25 DIAGNOSIS — Z8546 Personal history of malignant neoplasm of prostate: Secondary | ICD-10-CM | POA: Insufficient documentation

## 2022-10-25 LAB — CBC WITH DIFFERENTIAL (CANCER CENTER ONLY)
Abs Immature Granulocytes: 0.02 10*3/uL (ref 0.00–0.07)
Basophils Absolute: 0.1 10*3/uL (ref 0.0–0.1)
Basophils Relative: 1 %
Eosinophils Absolute: 0.3 10*3/uL (ref 0.0–0.5)
Eosinophils Relative: 6 %
HCT: 42.6 % (ref 39.0–52.0)
Hemoglobin: 14.1 g/dL (ref 13.0–17.0)
Immature Granulocytes: 0 %
Lymphocytes Relative: 20 %
Lymphs Abs: 1.1 10*3/uL (ref 0.7–4.0)
MCH: 30.4 pg (ref 26.0–34.0)
MCHC: 33.1 g/dL (ref 30.0–36.0)
MCV: 91.8 fL (ref 80.0–100.0)
Monocytes Absolute: 0.6 10*3/uL (ref 0.1–1.0)
Monocytes Relative: 11 %
Neutro Abs: 3.3 10*3/uL (ref 1.7–7.7)
Neutrophils Relative %: 62 %
Platelet Count: 233 10*3/uL (ref 150–400)
RBC: 4.64 MIL/uL (ref 4.22–5.81)
RDW: 13.4 % (ref 11.5–15.5)
WBC Count: 5.3 10*3/uL (ref 4.0–10.5)
nRBC: 0 % (ref 0.0–0.2)

## 2022-10-25 LAB — CMP (CANCER CENTER ONLY)
ALT: 35 U/L (ref 0–44)
AST: 27 U/L (ref 15–41)
Albumin: 3.9 g/dL (ref 3.5–5.0)
Alkaline Phosphatase: 73 U/L (ref 38–126)
Anion gap: 5 (ref 5–15)
BUN: 20 mg/dL (ref 8–23)
CO2: 30 mmol/L (ref 22–32)
Calcium: 8.9 mg/dL (ref 8.9–10.3)
Chloride: 104 mmol/L (ref 98–111)
Creatinine: 0.82 mg/dL (ref 0.61–1.24)
GFR, Estimated: >60 mL/min (ref >60)
Glucose, Bld: 187 mg/dL — ABNORMAL HIGH (ref 70–99)
Potassium: 4.1 mmol/L (ref 3.5–5.1)
Sodium: 139 mmol/L (ref 135–145)
Total Bilirubin: 0.6 mg/dL (ref 0.3–1.2)
Total Protein: 6.5 g/dL (ref 6.5–8.1)

## 2022-10-25 LAB — MAGNESIUM: Magnesium: 2 mg/dL (ref 1.7–2.4)

## 2022-10-25 LAB — LACTATE DEHYDROGENASE: LDH: 132 U/L (ref 98–192)

## 2022-10-25 NOTE — Progress Notes (Signed)
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 F/u  for Pulmonary extranodal MALT Lymphoma  HISTORY OF PRESENTING ILLNESS:  Please see previous note for details on initial presentation  INTERVAL HISTORY:   Jose Harvey is a wonderful 62 y.o. male who is here for evaluation and management of head and neck squamous cell carcinoma. [name] last visit with [place] was on 10/19/2021 and he reported that certain dry foods would become entrapped around the tissue from previous oral surgical site.  Today, he reports that he has been feeling well overall since his last visit. He reports endorsing normal eating and sleeping habits.  He reports that he is currently recovering from a bronchitis infection which has nearly resolved. He did take a COVID-19 test which was negative. He was given 2 rounds of antibiotics as well as steroids and has used an inhaler. He does not use an inhaler regularly otherwise. He was on Doxycycline for 7 days. Patient is no longer taking any steroids at this time but does continue to use an inhaler.   Patient was infected with COVID-19 in January 2024 which affected his breathing. He did not take any prescribed medication. He notes that his breathing did not fully recover by the time he was infected with bronchiolitis.  He reports a previous Sp02 level of 90%. His Sp02 is 100% today. He denies any new lumps/bumps, new skin lesion,  abdominal pain, change in bowel habits, leg swelling, back pain, fever, or chills.   MEDICAL HISTORY:  Past Medical History:  Diagnosis Date   Adrenal adenoma, left 03/2017   noted on CT   Allergic rhinitis, cause unspecified    Anxiety state, unspecified    Aortic atherosclerosis (HCC) 03/2017   noted on CT   BPV (benign positional vertigo)    COPD (chronic obstructive pulmonary disease) with emphysema (HCC)    mild followed by [facility]    Coronary artery disease    Depressive disorder, not elsewhere classified    Dyspnea    history of   ED (erectile dysfunction)    Gallbladder polyp 03/2016   noted on CT   Hepatic hemangioma 03/2017   noted on CT   History of kidney stones    hx of years ago    Indigestion    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)    Mixed hyperlipidemia    Prostate cancer (HCC)    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, not stated as uncontrolled(250.51)    Type I (juvenile type) diabetes mellitus without mention of complication, not stated as uncontrolled    type I - followed by Dr Sharl Ma    Vesicoureteral reflux     SURGICAL HISTORY: Past Surgical History:  Procedure Laterality Date   CHOLECYSTECTOMY N/A 05/17/2016   Procedure: LAPAROSCOPIC CHOLECYSTECTOMY;  Surgeon: Berna Bue, MD;  Location: [facility];  Service: General;  Laterality: N/A;   cyst removed from throat      dental surgeries      IR GASTROSTOMY TUBE MOD SED  07/07/2020  IR GASTROSTOMY TUBE REMOVAL  01/21/2021   IR IMAGING GUIDED PORT INSERTION  07/07/2020   IR REMOVAL TUN ACCESS W/ PORT W/O FL MOD SED  01/21/2021   LYMPHADENECTOMY Bilateral 07/02/2017   Procedure: BILATERAL LYMPHADENECTOMY;  Surgeon: Crista Elliot, MD;  Location: [facility];  Service: Urology;  Laterality: Bilateral;   PROSTATE BIOPSY     ROBOT ASSISTED LAPAROSCOPIC RADICAL PROSTATECTOMY N/A 07/02/2017   Procedure: XI ROBOTIC ASSISTED LAPAROSCOPIC RADICAL  PROSTATECTOMY;  Surgeon: Crista Elliot, MD;  Location: [facility];  Service: Urology;  Laterality: N/A;   TONSILLECTOMY     VIDEO ASSISTED THORACOSCOPY (VATS)/ LOBECTOMY Right 05/08/2019   Procedure: Right VIDEO ASSISTED THORACOSCOPY with   MIDDLE LOBECTOMY and Enbloc portion of Upper Lobe with Node dissection, Intercostal Nerve Block;  Surgeon: Loreli Slot, MD;  Location: [facility];  Service: Thoracic;  Laterality: Right;   WISDOM TOOTH EXTRACTION      SOCIAL HISTORY: Social History   Socioeconomic History   Marital status: Single    Spouse name: Not on file   Number of children: 2   Years of education: Not on file   Highest education level: Not on file  Occupational History    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  Tobacco Use   Smoking status: Former    Packs/day: 1.50    Years: 27.00    Additional pack years: 0.00    Total pack years: 40.50    Types: Cigarettes    Quit date: 06/05/2008    Years since quitting: 14.3   Smokeless tobacco: Former    Types: Snuff    Quit date: 2015  Vaping Use   Vaping Use: Never used  Substance and Sexual Activity   Alcohol use: Yes    Alcohol/week: 6.0 standard drinks of alcohol    Types: 6 Cans of beer per week    Comment: occasional 6pk per week   Drug use: No   Sexual activity: Not Currently  Other Topics Concern   Not on file  Social History Narrative   Patient divorced. Patient has two grown daughters but neither lives locally.   Social Determinants of Health   Financial Resource Strain: Low Risk  (06/11/2020)   Overall Financial Resource Strain (CARDIA)    Difficulty of Paying Living Expenses: Not very hard  Food Insecurity: No Food Insecurity (06/11/2020)   Hunger Vital Sign    Worried About Running Out of Food in the Last Year: Never true    Ran Out of Food in the Last Year: Never true  Transportation Needs: No Transportation Needs (06/11/2020)   PRAPARE - Administrator, Civil Service (Medical): No    Lack of  Transportation (Non-Medical): No  Physical Activity: Not on file  Stress: No Stress Concern Present (06/11/2020)   [employer] of Occupational Health - Occupational Stress Questionnaire    Feeling of Stress : Only a little  Social Connections: Unknown (06/11/2020)   Social Connection and Isolation Panel [NHANES]    Frequency of Communication with Friends and Family: More than three times a week    Frequency of Social Gatherings with Friends and Family: More than three times a week    Attends Religious Services: More than 4 times per year    Active Member of [employer] or Organizations: Yes    Attends Engineer, structural: More than 4 times per year    Marital Status: Not on file  Intimate Partner Violence: Not on file    FAMILY  HISTORY: Family History  Problem Relation Age of Onset   Diabetes type I Father    Heart attack Father    Hypertension Father    Emphysema Father    CAD Father    Colon cancer Maternal Grandfather    Emphysema Paternal Grandfather    Diabetes type I Paternal Grandfather    Kidney disease Neg Hx    Liver disease Neg Hx    Breast cancer Neg Hx    Prostate cancer Neg Hx     ALLERGIES:  is allergic to crestor [rosuvastatin].  MEDICATIONS:  Current Outpatient Medications  Medication Sig Dispense Refill   aspirin 81 MG tablet Take 1 tablet (81 mg total) by mouth daily. 30 tablet    cetirizine (ZYRTEC) 10 MG tablet Take 10 mg by mouth daily.     chlorhexidine (PERIDEX) 0.12 % solution Use as directed 15 mLs in the mouth or throat daily.     Coenzyme Q10 (COQ10) 100 MG CAPS Take 100 mg by mouth daily.     Continuous Blood Gluc Sensor (FREESTYLE LIBRE 14 DAY SENSOR) MISC AS DIRECTED CHANGE SENSOR EVERY 14 DAYS 28     ibuprofen (ADVIL) 200 MG tablet Take 400-800 mg by mouth every 6 (six) hours as needed for moderate pain. (Patient not taking: Reported on 06/17/2020)     Insulin Human (INSULIN PUMP) SOLN Inject into the skin continuous. NOVOLOG 100  UNIT/ML VIAL     lidocaine (XYLOCAINE) 2 % solution Patient: Mix 1part 2% viscous lidocaine, 1part H20. Swish & swallow 10mL of diluted mixture, before meals and at bedtime, up to QID 200 mL 4   lidocaine-prilocaine (EMLA) cream Apply to affected area once 30 g 3   lisinopril (PRINIVIL,ZESTRIL) 5 MG tablet Take 5 mg by mouth daily.      magic mouthwash w/lidocaine SOLN 5ml oral swish and swallow 4 times daily as needed  1 Part viscous lidocaine 2% 1 Part Maalox 1 Part diphenhydramine 12.5 mg per 5 ml elixir 1 part Nystatin 500000 units per 5ml 200 mL 2   Multiple Vitamins-Minerals (ICAPS AREDS 2) CAPS Take 1 capsule by mouth daily.     NOVOLOG 100 UNIT/ML injection Averages about 30 units per day via insulin pump     Nutritional Supplements (FEEDING SUPPLEMENT, GLUCERNA 1.5 CAL,) LIQD Give 2 cartons at 8am, noon and 4pm. Give 1 carton at 8pm. Flush with 60ml of water before and after.  Drink or give additional water via tube of 3 times per day.  Meets 100% of needs.  Send bolus supplies. 1659 mL 5   oxyCODONE (ROXICODONE) 5 MG/5ML solution Take 5 mLs (5 mg total) by mouth every 6 (six) hours as needed for severe pain. 200 mL 0   pantoprazole (PROTONIX) 40 MG tablet Take 40 mg by mouth daily.     rosuvastatin (CRESTOR) 5 MG tablet Take 1 tablet (5 mg total) by mouth daily. 90 tablet 3   sertraline (ZOLOFT) 100 MG tablet Take 50 mg by mouth daily.  0   No current facility-administered medications for this visit.    REVIEW OF SYSTEMS:    10 Point review of Systems was done is negative except as noted above.   PHYSICAL EXAMINATION: ECOG PERFORMANCE STATUS: 0 - Asymptomatic  Vitals:   10/25/22 0912  BP: 124/77  Pulse: 66  Resp: 20  Temp: (!) 97.5 F (36.4 C)  SpO2: 100%   GENERAL:alert, in no acute distress and comfortable SKIN: no acute rashes, no significant  lesions EYES: conjunctiva are pink and non-injected, sclera anicteric OROPHARYNX: MMM, no exudates, no  oropharyngeal erythema or ulceration NECK: supple, no JVD LYMPH:  no palpable lymphadenopathy in the cervical, axillary or inguinal regions LUNGS: clear to auscultation b/l with normal respiratory effort HEART: regular rate & rhythm ABDOMEN:  normoactive bowel sounds , non tender, not distended. Extremity: no pedal edema PSYCH: alert & oriented x 3 with fluent speech NEURO: no focal motor/sensory deficits   LABORATORY DATA:  I have reviewed the data as listed  .    Latest Ref Rng & Units 10/25/2022    7:47 AM 10/19/2021    8:20 AM 05/11/2021   10:02 AM  CBC  WBC 4.0 - 10.5 K/uL 5.3  4.7  4.7   Hemoglobin 13.0 - 17.0 g/dL 16.1  09.6  04.5   Hematocrit 39.0 - 52.0 % 42.6  41.1  41.6   Platelets 150 - 400 K/uL 233  216  230     .    Latest Ref Rng & Units 10/25/2022    7:47 AM 10/19/2021    8:20 AM 05/11/2021   10:02 AM  CMP  Glucose 70 - 99 mg/dL 409  811  914   BUN 8 - 23 mg/dL 20  23  25    Creatinine 0.61 - 1.24 mg/dL 7.82  9.56  2.13   Sodium 135 - 145 mmol/L 139  139  140   Potassium 3.5 - 5.1 mmol/L 4.1  3.9  4.3   Chloride 98 - 111 mmol/L 104  105  106   CO2 22 - 32 mmol/L 30  30  26    Calcium 8.9 - 10.3 mg/dL 8.9  9.1  9.1   Total Protein 6.5 - 8.1 g/dL 6.5  6.7  6.8   Total Bilirubin 0.3 - 1.2 mg/dL 0.6  0.5  0.6   Alkaline Phos 38 - 126 U/L 73  80  82   AST 15 - 41 U/L 27  20  22    ALT 0 - 44 U/L 35  17  26       05/26/2020 Right Cervical LN Bx ([id number]):   05/08/2019 Surgical Pathology [id number] [phone]):    05/08/2019 Surgical Pathology [phone]):    RADIOGRAPHIC STUDIES: I have personally reviewed the radiological images as listed and agreed with the findings in the report. CT Chest Wo Contrast  Result Date: 10/25/2022 CLINICAL DATA:  Lymphoma in oropharyngeal carcinoma. EXAM: CT CHEST WITHOUT CONTRAST TECHNIQUE: Multidetector CT imaging of the chest was performed following the standard protocol without IV contrast. RADIATION DOSE REDUCTION:  This exam was performed according to the departmental dose-optimization program which includes automated exposure control, adjustment of the mA and/or kV according to patient size and/or use of iterative reconstruction technique. COMPARISON:  05/13/2021 and 05/03/2020. FINDINGS: Cardiovascular: Atherosclerotic calcification of the aorta and coronary arteries. Heart size normal. No pericardial effusion. Mediastinum/Nodes: Obscuration of the fat planes in the right level II/III stations, partially imaged and possibly treatment related. 1.4 cm low-attenuation left thyroid nodule. No follow-up recommended. (Ref: J Am Coll Radiol. 2015 Feb;12(2): 143-50).Numerous small mediastinal lymph nodes measure up to 8 mm in the low right paratracheal station, similar. Hilar regions are difficult to evaluate without IV contrast. Calcified subcarinal lymph nodes. No axillary adenopathy. Esophagus is grossly unremarkable. Lungs/Pleura: Subpleural scarring in the anterior right upper lobe. Centrilobular emphysema. Right upper lobe wedge resection. Right middle lobectomy. Calcified granulomas. Minimal scattered mucoid impaction. No pleural fluid. Airway is otherwise unremarkable. Upper Abdomen: Hypodense  lesions in the liver measure up to 2.5 x 3.1 cm in the left hepatic lobe (2/138), previously characterized as hemangiomas. Cholecystectomy. Right adrenal gland is unremarkable. Left adrenal nodule measures 2.8 cm and 17 Hounsfield units, unchanged in size from 05/03/2020. No specific follow-up necessary. Visualized portions of the kidneys, spleen, pancreas, stomach and bowel are otherwise grossly unremarkable. No upper abdominal adenopathy. Musculoskeletal: Degenerative changes in the spine. No worrisome lytic or sclerotic lesions. IMPRESSION: 1. No evidence of disease recurrence. 2. Left adrenal adenoma. 3. Aortic atherosclerosis (ICD10-I70.0). Coronary artery calcification. 4.  Emphysema (ICD10-J43.9). Electronically Signed   By:  Leanna Battles M.D.   On: 10/25/2022 10:20    ASSESSMENT & PLAN:   62 y.o. 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 Patient is appropriate to pursue-Fourth weekly dose of carboplatin Taxol. Ordered use of sucralfate to help with his radiation mucositis  -Counseled extensively on continued smoking cessation and avoiding alcohol use. -Continue to optimize nutrition through feeding tube. -Notes that he is trying to stay well-hydrated.   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 Masslike area of ground-glass straddles the minor fissure with septal thickening and fissural retraction. While the lesion has increased in size minimally from 09/30/2018, there is clear enlargement from baseline examination on 02/16/2016. Therefore, lesion is characterized as worrisome for adenocarcinoma, Lung-RADS 4B, suspicious. -02/25/2016 PET/CT ([id number]) revealed "Dominant 8 mm right lower lobe pulmonary nodule shows no significant FDG uptake. Other scattered less than 5 mm  bilateral pulmonary nodules also show no FDG uptake but are too small to characterize by PET. Right middle lobe ground-glass opacity shows low-grade FDG uptake, and differential diagnosis includes inflammatory or infectious processes and low-grade adenocarcinoma. Multiple low-attenuation liver masses show no hypermetabolic activity compared to normal hepatic parenchyma. These remain indeterminate and cannot be characterized without IV contrast. Abdomen MRI without and with contrast recommended for further evaluation. 2.5 cm left adrenal mass shows mild FDG uptake, and remains indeterminate. Differential diagnosis includes atypical adenoma, metastasis, pheochromocytoma, or less likely adrenal cortical carcinoma."  PLAN:  -Discussed lab results on 10/25/2022 in detail with patient. CBC normal, showed WBC of 5.3K, hemoglobin of 14.1, and platelets of 233K. -CMP normal, glucose slightly high -LDH normal -Discussed results of CT chest on 10/21/2022 which showed no new lung nodules suggestive of recurrence of pulmonary marginal zone lymphoma. Scan did show some small mediastatic lymph nodes ikely from recent bronchitis infection -informed patient that COVID -19 may produce some lung nodules -Continue Vitamin B Complex daily. -Continue f/u with PCP to optimize DM2 management. - no lab or clinical evidence of recurrence/progression of head and neck Squamous cell carcinoma or Pulmonary  MALT lymphoma. -continue to follow with Dr. Pollyann Kennedy every 4 months for continued monitoring -will continue to monitor with labs in 1 year  FOLLOW UP: RTC with Dr Candise Che with labs in 12 months  The total time spent in the appointment was 20 minutes* .  All of the patient's questions were answered with apparent satisfaction. The patient knows to call the clinic with any problems, questions or concerns.   Wyvonnia Lora MD MS AAHIVMS [facility] Hematology/Oncology Physician [facility]  .*Total Encounter Time as  defined by the Centers for Medicare and Medicaid Services includes, in addition to the face-to-face time of a patient visit (documented in the note above) non-face-to-face time: obtaining and reviewing outside history, ordering and reviewing medications, tests or procedures, care coordination (communications with other health care professionals or caregivers) and documentation in the medical record.    I,Mitra Faeizi,acting as a Neurosurgeon for Wyvonnia Lora, MD.,have documented all relevant documentation on the behalf of Wyvonnia Lora, MD,as directed by  Wyvonnia Lora, MD while in the presence of Wyvonnia Lora, MD.  .I have reviewed the above documentation for accuracy and completeness, and I agree with the above. Johney Maine MD

## 2022-10-27 ENCOUNTER — Telehealth: Payer: Self-pay | Admitting: Hematology

## 2022-11-03 DIAGNOSIS — E103293 Type 1 diabetes mellitus with mild nonproliferative diabetic retinopathy without macular edema, bilateral: Secondary | ICD-10-CM | POA: Diagnosis not present

## 2022-11-03 DIAGNOSIS — Z794 Long term (current) use of insulin: Secondary | ICD-10-CM | POA: Diagnosis not present

## 2022-11-03 DIAGNOSIS — Z9641 Presence of insulin pump (external) (internal): Secondary | ICD-10-CM | POA: Diagnosis not present

## 2022-11-03 DIAGNOSIS — E1065 Type 1 diabetes mellitus with hyperglycemia: Secondary | ICD-10-CM | POA: Diagnosis not present

## 2022-12-11 ENCOUNTER — Encounter: Payer: Self-pay | Admitting: Podiatry

## 2022-12-11 ENCOUNTER — Ambulatory Visit: Payer: 59 | Admitting: Podiatry

## 2022-12-11 ENCOUNTER — Ambulatory Visit (INDEPENDENT_AMBULATORY_CARE_PROVIDER_SITE_OTHER): Payer: 59

## 2022-12-11 DIAGNOSIS — M2062 Acquired deformities of toe(s), unspecified, left foot: Secondary | ICD-10-CM

## 2022-12-11 DIAGNOSIS — M79675 Pain in left toe(s): Secondary | ICD-10-CM | POA: Diagnosis not present

## 2022-12-11 DIAGNOSIS — M2042 Other hammer toe(s) (acquired), left foot: Secondary | ICD-10-CM | POA: Diagnosis not present

## 2022-12-11 DIAGNOSIS — G8929 Other chronic pain: Secondary | ICD-10-CM

## 2022-12-11 NOTE — Progress Notes (Signed)
Subjective:   Patient ID: Jose Harvey, male   DOB: 62 y.o.   MRN: [id number]   HPI Chief Complaint  Patient presents with   Toe Pain    2nd toe left - dull ache x 1 year, no injury, localized in the toe, check up with diabetic doc and he thought might need to have checked for any preventative, last A1c was 6.8   New Patient (Initial Visit)   No recent injuries. He will notice some discomfort at night. No swelling. No treatment so far. He has trigger finger and he is not sure if that can affect his toes.   History of right toe ulcer from a lawn mower.  No numbness/tingling No claudication symptoms.    Review of Systems  All other systems reviewed and are negative.  Past Medical History:  Diagnosis Date   Adrenal adenoma, left 03/2017   noted on CT   Allergic rhinitis, cause unspecified    Anxiety state, unspecified    Aortic atherosclerosis (HCC) 03/2017   noted on CT   BPV (benign positional vertigo)    COPD (chronic obstructive pulmonary disease) with emphysema (HCC)    mild followed by [facility]    Coronary artery disease    Depressive disorder, not elsewhere classified    Dyspnea    history of   ED (erectile dysfunction)    Gallbladder polyp 03/2016   noted on CT   Hepatic hemangioma 03/2017   noted on CT   History of kidney stones    hx of years ago    Indigestion    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)    Mixed hyperlipidemia    Prostate cancer (HCC)    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, not stated as uncontrolled(250.51)    Type I (juvenile type) diabetes mellitus without mention of complication, not stated as uncontrolled    type I - followed by Dr Sharl Ma    Vesicoureteral reflux     Past Surgical History:  Procedure Laterality Date   CHOLECYSTECTOMY N/A 05/17/2016   Procedure: LAPAROSCOPIC CHOLECYSTECTOMY;  Surgeon: Berna Bue, MD;  Location: [facility];  Service: General;  Laterality: N/A;    cyst removed from throat      dental surgeries      IR GASTROSTOMY TUBE MOD SED  07/07/2020   IR GASTROSTOMY TUBE REMOVAL  01/21/2021   IR IMAGING GUIDED PORT INSERTION  07/07/2020   IR REMOVAL TUN ACCESS W/ PORT W/O FL MOD SED  01/21/2021   LYMPHADENECTOMY Bilateral 07/02/2017   Procedure: BILATERAL LYMPHADENECTOMY;  Surgeon: Crista Elliot, MD;  Location: [facility];  Service: Urology;  Laterality: Bilateral;   PROSTATE BIOPSY     ROBOT ASSISTED LAPAROSCOPIC RADICAL PROSTATECTOMY N/A 07/02/2017   Procedure: XI ROBOTIC ASSISTED LAPAROSCOPIC RADICAL PROSTATECTOMY;  Surgeon: Crista Elliot, MD;  Location: [facility];  Service: Urology;  Laterality: N/A;   TONSILLECTOMY     VIDEO ASSISTED THORACOSCOPY (VATS)/ LOBECTOMY Right 05/08/2019   Procedure: Right VIDEO ASSISTED THORACOSCOPY with   MIDDLE LOBECTOMY and Enbloc portion of Upper Lobe with Node dissection, Intercostal Nerve Block;  Surgeon: Loreli Slot, MD;  Location: [facility];  Service: Thoracic;  Laterality: Right;   WISDOM TOOTH EXTRACTION       Current Outpatient Medications:    albuterol (VENTOLIN HFA) 108 (90 Base) MCG/ACT inhaler, SMARTSIG:1-2 Puff(s) Via Inhaler Every 4-6 Hours PRN, Disp: , Rfl:    LAGEVRIO 200 MG CAPS capsule, TAKE 4  EACH (800 MG TOTAL) BY MOUTH TWICE A DAY FOR 5 DAYS, Disp: , Rfl:    cetirizine (ZYRTEC) 10 MG tablet, Take 10 mg by mouth daily., Disp: , Rfl:    Continuous Blood Gluc Sensor (FREESTYLE LIBRE 14 DAY SENSOR) MISC, AS DIRECTED CHANGE SENSOR EVERY 14 DAYS 28, Disp: , Rfl:    Insulin Human (INSULIN PUMP) SOLN, Inject into the skin continuous. NOVOLOG 100 UNIT/ML VIAL, Disp: , Rfl:    Multiple Vitamins-Minerals (ICAPS AREDS 2) CAPS, Take 1 capsule by mouth daily., Disp: , Rfl:    NOVOLOG 100 UNIT/ML injection, Averages about 30 units per day via insulin pump, Disp: , Rfl:    pantoprazole (PROTONIX) 40 MG tablet, Take 40 mg by mouth daily., Disp: , Rfl:    rosuvastatin (CRESTOR) 5 MG tablet, Take 1  tablet (5 mg total) by mouth daily., Disp: 90 tablet, Rfl: 3   sertraline (ZOLOFT) 100 MG tablet, Take 50 mg by mouth daily., Disp: , Rfl: 0   WIXELA INHUB 100-50 MCG/ACT AEPB, Inhale 1 puff into the lungs 2 (two) times daily., Disp: , Rfl:   Allergies  Allergen Reactions   Crestor [Rosuvastatin]     Muscle aches in higher dose           Objective:  Physical Exam  General: AAO x3, NAD  Dermatological: Skin is warm, dry and supple bilateral.  There are no open sores, no preulcerative lesions, no rash or signs of infection present.  Vascular: Dorsalis Pedis artery and Posterior Tibial artery pedal pulses are 2/4 bilateral with immedate capillary fill time.  There is no pain with calf compression, swelling, warmth, erythema.   Neruologic: Grossly intact via light touch bilateral.  Musculoskeletal: There is mild discomfort noted at the level of the left second DIPJ.  There is no edema, erythema.  There is no other areas of pinpoint tenderness.  Slight transverse plane deformity noted at the level of the DIPJ.  Gait: Unassisted, Nonantalgic.       Assessment:   Left second toe DIPJ deformity     Plan:  -Treatment options discussed including all alternatives, risks, and complications -Etiology of symptoms were discussed -X-rays were obtained and reviewed with the patient.  3 views left foot were obtained.  No subacute fracture.  There is obliquity noted at the level of the DIPJ which is corresponding to these area of discomfort. -We discussed both conservative as well as surgical treatment options.  Will start conservative treatment.  Dispensed offloading pad to help.  In the future can consider TMJ arthroplasty if needed.  We briefly discussed the surgery was postoperative course treatment to proceed with this.  Vivi Barrack DPM

## 2022-12-11 NOTE — Patient Instructions (Signed)

## 2022-12-12 DIAGNOSIS — M65322 Trigger finger, left index finger: Secondary | ICD-10-CM | POA: Diagnosis not present

## 2023-01-10 DIAGNOSIS — K219 Gastro-esophageal reflux disease without esophagitis: Secondary | ICD-10-CM | POA: Diagnosis not present

## 2023-01-10 DIAGNOSIS — Z923 Personal history of irradiation: Secondary | ICD-10-CM | POA: Diagnosis not present

## 2023-01-10 DIAGNOSIS — C109 Malignant neoplasm of oropharynx, unspecified: Secondary | ICD-10-CM | POA: Diagnosis not present

## 2023-01-23 DIAGNOSIS — M1712 Unilateral primary osteoarthritis, left knee: Secondary | ICD-10-CM | POA: Diagnosis not present

## 2023-02-02 DIAGNOSIS — F3342 Major depressive disorder, recurrent, in full remission: Secondary | ICD-10-CM | POA: Diagnosis not present

## 2023-02-02 DIAGNOSIS — E785 Hyperlipidemia, unspecified: Secondary | ICD-10-CM | POA: Diagnosis not present

## 2023-02-02 DIAGNOSIS — N521 Erectile dysfunction due to diseases classified elsewhere: Secondary | ICD-10-CM | POA: Diagnosis not present

## 2023-02-02 DIAGNOSIS — H8110 Benign paroxysmal vertigo, unspecified ear: Secondary | ICD-10-CM | POA: Diagnosis not present

## 2023-02-02 DIAGNOSIS — J069 Acute upper respiratory infection, unspecified: Secondary | ICD-10-CM | POA: Diagnosis not present

## 2023-02-02 DIAGNOSIS — E103293 Type 1 diabetes mellitus with mild nonproliferative diabetic retinopathy without macular edema, bilateral: Secondary | ICD-10-CM | POA: Diagnosis not present

## 2023-02-02 DIAGNOSIS — J449 Chronic obstructive pulmonary disease, unspecified: Secondary | ICD-10-CM | POA: Diagnosis not present

## 2023-02-06 DIAGNOSIS — Z9641 Presence of insulin pump (external) (internal): Secondary | ICD-10-CM | POA: Diagnosis not present

## 2023-02-06 DIAGNOSIS — E1065 Type 1 diabetes mellitus with hyperglycemia: Secondary | ICD-10-CM | POA: Diagnosis not present

## 2023-02-06 DIAGNOSIS — E103293 Type 1 diabetes mellitus with mild nonproliferative diabetic retinopathy without macular edema, bilateral: Secondary | ICD-10-CM | POA: Diagnosis not present

## 2023-02-12 DIAGNOSIS — D225 Melanocytic nevi of trunk: Secondary | ICD-10-CM | POA: Diagnosis not present

## 2023-02-12 DIAGNOSIS — L570 Actinic keratosis: Secondary | ICD-10-CM | POA: Diagnosis not present

## 2023-02-12 DIAGNOSIS — L821 Other seborrheic keratosis: Secondary | ICD-10-CM | POA: Diagnosis not present

## 2023-02-12 DIAGNOSIS — D2272 Melanocytic nevi of left lower limb, including hip: Secondary | ICD-10-CM | POA: Diagnosis not present

## 2023-06-12 DIAGNOSIS — M6740 Ganglion, unspecified site: Secondary | ICD-10-CM | POA: Diagnosis not present

## 2023-06-12 DIAGNOSIS — M67441 Ganglion, right hand: Secondary | ICD-10-CM | POA: Diagnosis not present

## 2023-06-12 DIAGNOSIS — M19041 Primary osteoarthritis, right hand: Secondary | ICD-10-CM | POA: Diagnosis not present

## 2023-06-12 DIAGNOSIS — M65321 Trigger finger, right index finger: Secondary | ICD-10-CM | POA: Diagnosis not present

## 2023-06-26 DIAGNOSIS — E1065 Type 1 diabetes mellitus with hyperglycemia: Secondary | ICD-10-CM | POA: Diagnosis not present

## 2023-06-26 DIAGNOSIS — Z794 Long term (current) use of insulin: Secondary | ICD-10-CM | POA: Diagnosis not present

## 2023-06-26 DIAGNOSIS — Z9641 Presence of insulin pump (external) (internal): Secondary | ICD-10-CM | POA: Diagnosis not present

## 2023-06-26 DIAGNOSIS — E103293 Type 1 diabetes mellitus with mild nonproliferative diabetic retinopathy without macular edema, bilateral: Secondary | ICD-10-CM | POA: Diagnosis not present

## 2023-06-27 ENCOUNTER — Other Ambulatory Visit: Payer: Self-pay | Admitting: Orthopedic Surgery

## 2023-07-19 DIAGNOSIS — C109 Malignant neoplasm of oropharynx, unspecified: Secondary | ICD-10-CM | POA: Diagnosis not present

## 2023-07-19 DIAGNOSIS — Z923 Personal history of irradiation: Secondary | ICD-10-CM | POA: Diagnosis not present

## 2023-08-02 ENCOUNTER — Encounter (HOSPITAL_BASED_OUTPATIENT_CLINIC_OR_DEPARTMENT_OTHER): Payer: Self-pay | Admitting: Orthopedic Surgery

## 2023-08-06 ENCOUNTER — Encounter (HOSPITAL_BASED_OUTPATIENT_CLINIC_OR_DEPARTMENT_OTHER)
Admission: RE | Admit: 2023-08-06 | Discharge: 2023-08-06 | Disposition: A | Discharge status: Home / Self Care | Source: Ambulatory Visit | Attending: Orthopedic Surgery | Admitting: Orthopedic Surgery

## 2023-08-06 DIAGNOSIS — Z01818 Encounter for other preprocedural examination: Secondary | ICD-10-CM | POA: Diagnosis not present

## 2023-08-06 DIAGNOSIS — Z8546 Personal history of malignant neoplasm of prostate: Secondary | ICD-10-CM | POA: Diagnosis not present

## 2023-08-06 DIAGNOSIS — Z0000 Encounter for general adult medical examination without abnormal findings: Secondary | ICD-10-CM | POA: Diagnosis not present

## 2023-08-06 DIAGNOSIS — Z01812 Encounter for preprocedural laboratory examination: Secondary | ICD-10-CM | POA: Diagnosis not present

## 2023-08-06 DIAGNOSIS — Z01810 Encounter for preprocedural cardiovascular examination: Secondary | ICD-10-CM | POA: Diagnosis not present

## 2023-08-06 LAB — BASIC METABOLIC PANEL
Anion gap: 11 (ref 5–15)
BUN: 19 mg/dL (ref 8–23)
CO2: 25 mmol/L (ref 22–32)
Calcium: 9.2 mg/dL (ref 8.9–10.3)
Chloride: 105 mmol/L (ref 98–111)
Creatinine, Ser: 0.94 mg/dL (ref 0.61–1.24)
GFR, Estimated: >60 mL/min (ref >60)
Glucose, Bld: 125 mg/dL — ABNORMAL HIGH (ref 70–99)
Potassium: 4.6 mmol/L (ref 3.5–5.1)
Sodium: 141 mmol/L (ref 135–145)

## 2023-08-06 NOTE — Progress Notes (Signed)

## 2023-08-09 ENCOUNTER — Ambulatory Visit (HOSPITAL_BASED_OUTPATIENT_CLINIC_OR_DEPARTMENT_OTHER): Admitting: Anesthesiology

## 2023-08-09 ENCOUNTER — Encounter (HOSPITAL_BASED_OUTPATIENT_CLINIC_OR_DEPARTMENT_OTHER)
Admission: RE | Disposition: A | Discharge status: Home / Self Care | Payer: Self-pay | Source: Home / Self Care | Attending: Orthopedic Surgery

## 2023-08-09 ENCOUNTER — Other Ambulatory Visit: Payer: Self-pay

## 2023-08-09 ENCOUNTER — Ambulatory Visit (HOSPITAL_BASED_OUTPATIENT_CLINIC_OR_DEPARTMENT_OTHER)
Admission: RE | Admit: 2023-08-09 | Discharge: 2023-08-09 | Disposition: A | Discharge status: Home / Self Care | Payer: 59 | Attending: Orthopedic Surgery | Admitting: Orthopedic Surgery

## 2023-08-09 ENCOUNTER — Encounter (HOSPITAL_BASED_OUTPATIENT_CLINIC_OR_DEPARTMENT_OTHER): Payer: Self-pay | Admitting: Orthopedic Surgery

## 2023-08-09 DIAGNOSIS — M19041 Primary osteoarthritis, right hand: Secondary | ICD-10-CM | POA: Diagnosis not present

## 2023-08-09 DIAGNOSIS — M67441 Ganglion, right hand: Secondary | ICD-10-CM | POA: Insufficient documentation

## 2023-08-09 DIAGNOSIS — M65321 Trigger finger, right index finger: Secondary | ICD-10-CM | POA: Insufficient documentation

## 2023-08-09 DIAGNOSIS — E1039 Type 1 diabetes mellitus with other diabetic ophthalmic complication: Secondary | ICD-10-CM | POA: Diagnosis not present

## 2023-08-09 DIAGNOSIS — Z01818 Encounter for other preprocedural examination: Secondary | ICD-10-CM

## 2023-08-09 DIAGNOSIS — Z794 Long term (current) use of insulin: Secondary | ICD-10-CM | POA: Insufficient documentation

## 2023-08-09 DIAGNOSIS — Z8546 Personal history of malignant neoplasm of prostate: Secondary | ICD-10-CM | POA: Insufficient documentation

## 2023-08-09 DIAGNOSIS — Z87891 Personal history of nicotine dependence: Secondary | ICD-10-CM | POA: Insufficient documentation

## 2023-08-09 HISTORY — PX: CYST EXCISION: SHX5701

## 2023-08-09 HISTORY — PX: TENDON EXPLORATION: SHX5112

## 2023-08-09 HISTORY — PX: TRIGGER FINGER RELEASE: SHX641

## 2023-08-09 LAB — GLUCOSE, CAPILLARY
Glucose-Capillary: 117 mg/dL — ABNORMAL HIGH (ref 70–99)
Glucose-Capillary: 151 mg/dL — ABNORMAL HIGH (ref 70–99)

## 2023-08-09 SURGERY — CYST REMOVAL
Anesthesia: General | Site: Arm Lower | Laterality: Right

## 2023-08-09 MED ORDER — ACETAMINOPHEN 10 MG/ML IV SOLN
INTRAVENOUS | Status: DC | PRN
  Administered 2023-08-09: 1000 mg via INTRAVENOUS

## 2023-08-09 MED ORDER — BUPIVACAINE HCL (PF) 0.25 % IJ SOLN
INTRAMUSCULAR | Status: DC | PRN
  Administered 2023-08-09: 9 mL

## 2023-08-09 MED ORDER — BUPIVACAINE HCL (PF) 0.25 % IJ SOLN
INTRAMUSCULAR | Status: AC
  Filled 2023-08-09: qty 120

## 2023-08-09 MED ORDER — ACETAMINOPHEN 500 MG PO TABS: 1000.0000 mg | ORAL_TABLET | Freq: Once | ORAL | Status: DC

## 2023-08-09 MED ORDER — 0.9 % SODIUM CHLORIDE (POUR BTL) OPTIME
TOPICAL | Status: DC | PRN
  Administered 2023-08-09: 500 mL

## 2023-08-09 MED ORDER — ONDANSETRON HCL 4 MG/2ML IJ SOLN
INTRAMUSCULAR | Status: AC
  Filled 2023-08-09: qty 2

## 2023-08-09 MED ORDER — CEFAZOLIN SODIUM-DEXTROSE 2-4 GM/100ML-% IV SOLN
INTRAVENOUS | Status: AC
  Filled 2023-08-09: qty 100

## 2023-08-09 MED ORDER — ONDANSETRON HCL 4 MG/2ML IJ SOLN
INTRAMUSCULAR | Status: DC | PRN
  Administered 2023-08-09: 4 mg via INTRAVENOUS

## 2023-08-09 MED ORDER — EPHEDRINE 5 MG/ML INJ
INTRAVENOUS | Status: AC
  Filled 2023-08-09: qty 5

## 2023-08-09 MED ORDER — VANCOMYCIN HCL 500 MG IV SOLR
INTRAVENOUS | Status: AC
  Filled 2023-08-09: qty 20

## 2023-08-09 MED ORDER — DEXAMETHASONE SODIUM PHOSPHATE 10 MG/ML IJ SOLN
INTRAMUSCULAR | Status: DC | PRN
  Administered 2023-08-09: 8 mg via INTRAVENOUS

## 2023-08-09 MED ORDER — LACTATED RINGERS IV SOLN: INTRAVENOUS | Status: DC

## 2023-08-09 MED ORDER — MIDAZOLAM HCL 2 MG/2ML IJ SOLN
INTRAMUSCULAR | Status: AC
  Filled 2023-08-09: qty 2

## 2023-08-09 MED ORDER — DEXAMETHASONE SODIUM PHOSPHATE 10 MG/ML IJ SOLN
INTRAMUSCULAR | Status: AC
  Filled 2023-08-09: qty 1

## 2023-08-09 MED ORDER — DEXMEDETOMIDINE HCL IN NACL 80 MCG/20ML IV SOLN
INTRAVENOUS | Status: DC | PRN
  Administered 2023-08-09: 8 ug via INTRAVENOUS

## 2023-08-09 MED ORDER — FENTANYL CITRATE (PF) 100 MCG/2ML IJ SOLN
INTRAMUSCULAR | Status: DC | PRN
  Administered 2023-08-09 (×2): 50 ug via INTRAVENOUS

## 2023-08-09 MED ORDER — FENTANYL CITRATE (PF) 100 MCG/2ML IJ SOLN: 25.0000 ug | INTRAMUSCULAR | Status: DC | PRN

## 2023-08-09 MED ORDER — HYDROCODONE-ACETAMINOPHEN 5-325 MG PO TABS: ORAL_TABLET | ORAL | 0 refills | Status: DC

## 2023-08-09 MED ORDER — CEFAZOLIN SODIUM-DEXTROSE 2-4 GM/100ML-% IV SOLN
2.0000 g | INTRAVENOUS | Status: AC
  Administered 2023-08-09: 2 g via INTRAVENOUS

## 2023-08-09 MED ORDER — MIDAZOLAM HCL 5 MG/5ML IJ SOLN
INTRAMUSCULAR | Status: DC | PRN
  Administered 2023-08-09: 2 mg via INTRAVENOUS

## 2023-08-09 MED ORDER — LIDOCAINE 2% (20 MG/ML) 5 ML SYRINGE
INTRAMUSCULAR | Status: DC | PRN
  Administered 2023-08-09: 80 mg via INTRAVENOUS

## 2023-08-09 MED ORDER — EPHEDRINE SULFATE (PRESSORS) 50 MG/ML IJ SOLN
INTRAMUSCULAR | Status: DC | PRN
  Administered 2023-08-09 (×2): 10 mg via INTRAVENOUS

## 2023-08-09 MED ORDER — ACETAMINOPHEN 10 MG/ML IV SOLN
INTRAVENOUS | Status: AC
  Filled 2023-08-09: qty 100

## 2023-08-09 MED ORDER — OXYCODONE HCL 5 MG PO TABS: 5.0000 mg | ORAL_TABLET | Freq: Once | ORAL | Status: DC | PRN

## 2023-08-09 MED ORDER — OXYCODONE HCL 5 MG/5ML PO SOLN: 5.0000 mg | Freq: Once | ORAL | Status: DC | PRN

## 2023-08-09 MED ORDER — LIDOCAINE 2% (20 MG/ML) 5 ML SYRINGE
INTRAMUSCULAR | Status: AC
  Filled 2023-08-09: qty 5

## 2023-08-09 MED ORDER — PROPOFOL 10 MG/ML IV BOLUS
INTRAVENOUS | Status: DC | PRN
  Administered 2023-08-09: 180 mg via INTRAVENOUS

## 2023-08-09 MED ORDER — FENTANYL CITRATE (PF) 100 MCG/2ML IJ SOLN
INTRAMUSCULAR | Status: AC
  Filled 2023-08-09: qty 2

## 2023-08-09 MED ORDER — CELECOXIB 200 MG PO CAPS: 200.0000 mg | ORAL_CAPSULE | Freq: Once | ORAL | Status: DC

## 2023-08-09 SURGICAL SUPPLY — 42 items
BANDAGE GAUZE 1X75IN STRL (MISCELLANEOUS) IMPLANT
BENZOIN TINCTURE PRP APPL 2/3 (GAUZE/BANDAGES/DRESSINGS) IMPLANT
BLADE MINI RND TIP GREEN BEAV (BLADE) IMPLANT
BLADE SURG 15 STRL LF DISP TIS (BLADE) ×4 IMPLANT
BNDG COHESIVE 1X5 TAN STRL LF (GAUZE/BANDAGES/DRESSINGS) IMPLANT
BNDG COHESIVE 2X5 TAN ST LF (GAUZE/BANDAGES/DRESSINGS) ×2 IMPLANT
BNDG ELASTIC 2INX 5YD STR LF (GAUZE/BANDAGES/DRESSINGS) IMPLANT
BNDG ELASTIC 3INX 5YD STR LF (GAUZE/BANDAGES/DRESSINGS) IMPLANT
BNDG ESMARK 4X9 LF (GAUZE/BANDAGES/DRESSINGS) ×1 IMPLANT
BNDG GAUZE 1X75IN STRL (MISCELLANEOUS) IMPLANT
BNDG GAUZE DERMACEA FLUFF 4 (GAUZE/BANDAGES/DRESSINGS) IMPLANT
BNDG PLASTER X FAST 3X3 WHT LF (CAST SUPPLIES) IMPLANT
CHLORAPREP W/TINT 26 (MISCELLANEOUS) ×2 IMPLANT
CORD BIPOLAR FORCEPS 12FT (ELECTRODE) ×2 IMPLANT
COVER BACK TABLE 60X90IN (DRAPES) ×2 IMPLANT
COVER MAYO STAND STRL (DRAPES) ×2 IMPLANT
CUFF TOURN SGL QUICK 18X4 (TOURNIQUET CUFF) ×2 IMPLANT
DRAPE EXTREMITY T 121X128X90 (DISPOSABLE) ×2 IMPLANT
DRAPE SURG 17X23 STRL (DRAPES) ×2 IMPLANT
GAUZE SPONGE 4X4 12PLY STRL (GAUZE/BANDAGES/DRESSINGS) ×2 IMPLANT
GAUZE STRETCH 2X75IN STRL (MISCELLANEOUS) IMPLANT
GAUZE XEROFORM 1X8 LF (GAUZE/BANDAGES/DRESSINGS) ×2 IMPLANT
GLOVE BIO SURGEON STRL SZ7.5 (GLOVE) ×2 IMPLANT
GLOVE BIOGEL PI IND STRL 8 (GLOVE) ×2 IMPLANT
GOWN STRL REUS W/ TWL LRG LVL3 (GOWN DISPOSABLE) ×2 IMPLANT
GOWN STRL REUS W/TWL XL LVL3 (GOWN DISPOSABLE) ×2 IMPLANT
NDL HYPO 25X1 1.5 SAFETY (NEEDLE) ×1 IMPLANT
NEEDLE HYPO 25X1 1.5 SAFETY (NEEDLE) ×2 IMPLANT
NS IRRIG 1000ML POUR BTL (IV SOLUTION) ×2 IMPLANT
PACK BASIN DAY SURGERY FS (CUSTOM PROCEDURE TRAY) ×2 IMPLANT
PAD CAST 3X4 CTTN HI CHSV (CAST SUPPLIES) IMPLANT
PAD CAST 4YDX4 CTTN HI CHSV (CAST SUPPLIES) IMPLANT
PADDING CAST ABS COTTON 4X4 ST (CAST SUPPLIES) ×1 IMPLANT
SPLINT FINGER 3.25 911903 (SOFTGOODS) ×1 IMPLANT
STOCKINETTE 4X48 STRL (DRAPES) ×2 IMPLANT
STRIP CLOSURE SKIN 1/2X4 (GAUZE/BANDAGES/DRESSINGS) IMPLANT
SUT ETHILON 3 0 PS 1 (SUTURE) IMPLANT
SUT ETHILON 4 0 PS 2 18 (SUTURE) ×2 IMPLANT
SYR BULB EAR ULCER 3OZ GRN STR (SYRINGE) ×2 IMPLANT
SYR CONTROL 10ML LL (SYRINGE) ×2 IMPLANT
TOWEL GREEN STERILE FF (TOWEL DISPOSABLE) ×4 IMPLANT
UNDERPAD 30X36 HEAVY ABSORB (UNDERPADS AND DIAPERS) ×2 IMPLANT

## 2023-08-09 NOTE — Anesthesia Postprocedure Evaluation (Signed)
 Anesthesia Post Note  Patient: Jose Harvey  Procedure(s) Performed: RIGHT LONG FINGER EXCISION MUCOID CYSTS (Right: Arm Lower) DEBRIDEMENT DISTAL INTERPHALANGEAL JOINT (Right: Arm Lower) RELEASE TRIGGER FINGER/A-1 PULLEY RIGHT INDEX FINGER (Right: Arm Lower)     Patient location during evaluation: PACU Anesthesia Type: General Level of consciousness: awake and alert Pain management: pain level controlled Vital Signs Assessment: post-procedure vital signs reviewed and stable Respiratory status: spontaneous breathing, nonlabored ventilation, respiratory function stable and patient connected to nasal cannula oxygen Cardiovascular status: blood pressure returned to baseline and stable Postop Assessment: no apparent nausea or vomiting Anesthetic complications: no   No notable events documented.  Last Vitals:  Vitals:   08/09/23 1130 08/09/23 1200  BP: 115/63 128/69  Pulse: 63 65  Resp: 10 14  Temp:  (!) 36.1 C  SpO2: 92% 95%    Last Pain:  Vitals:   08/09/23 1200  TempSrc:   PainSc: 0-No pain                 Earl Lites P Sanari Offner

## 2023-08-09 NOTE — Anesthesia Procedure Notes (Signed)
 Procedure Name: LMA Insertion Date/Time: 08/09/2023 10:29 AM  Performed by: Yolanda Bonine, CRNAPre-anesthesia Checklist: Patient identified, Emergency Drugs available, Suction available, Patient being monitored and Timeout performed Patient Re-evaluated:Patient Re-evaluated prior to induction Oxygen Delivery Method: Circle system utilized Preoxygenation: Pre-oxygenation with 100% oxygen Induction Type: IV induction Ventilation: Mask ventilation without difficulty LMA: LMA inserted LMA Size: 4.0 Number of attempts: 2 Placement Confirmation: positive ETCO2 and breath sounds checked- equal and bilateral Dental Injury: Teeth and Oropharynx as per pre-operative assessment  Comments: First LMA  5 insertion attempt = LMA 5 was too large; Pt H/O throat radiation. Second attempt using Classic LMA 4; successful placement and seating of LMA 4. No trauma occurred during either LMA insertion.

## 2023-08-09 NOTE — Anesthesia Preprocedure Evaluation (Signed)
 Anesthesia Evaluation  Patient identified by MRN, date of birth, ID band Patient awake    Reviewed: Allergy & Precautions, NPO status , Patient's Chart, lab work & pertinent test results  Airway Mallampati: II  TM Distance: >3 FB Neck ROM: Full    Dental no notable dental hx.    Pulmonary COPD, former smoker   Pulmonary exam normal        Cardiovascular hypertension, + CAD and + DOE   Rhythm:Regular Rate:Normal     Neuro/Psych   Anxiety Depression    negative neurological ROS     GI/Hepatic Neg liver ROS,GERD  Medicated,,  Endo/Other  diabetes, Type 1    Renal/GU negative Renal ROS  negative genitourinary   Musculoskeletal RIGHT INDEX FINGER AND RIGHT LONG FINGER MUCOID CYSTS AND DISTAL INTERPHALANGEAL ARTHRITIS; RIGHT INDEX TRIGGER DIGIT   Abdominal Normal abdominal exam  (+)   Peds  Hematology Lab Results      Component                Value               Date                      WBC                      5.3                 10/25/2022                HGB                      14.1                10/25/2022                HCT                      42.6                10/25/2022                MCV                      91.8                10/25/2022                PLT                      233                 10/25/2022              Anesthesia Other Findings   Reproductive/Obstetrics                             Anesthesia Physical Anesthesia Plan  ASA: 2  Anesthesia Plan: General   Post-op Pain Management: Celebrex PO (pre-op)* and Tylenol PO (pre-op)*   Induction: Intravenous  PONV Risk Score and Plan: 2 and Ondansetron, Dexamethasone, Midazolam and Treatment may vary due to age or medical condition  Airway Management Planned: Mask and LMA  Additional Equipment: None  Intra-op Plan:   Post-operative Plan: Extubation in OR  Informed Consent: I have reviewed the patients History  and  Physical, chart, labs and discussed the procedure including the risks, benefits and alternatives for the proposed anesthesia with the patient or authorized representative who has indicated his/her understanding and acceptance.     Dental advisory given  Plan Discussed with: CRNA  Anesthesia Plan Comments:        Anesthesia Quick Evaluation

## 2023-08-09 NOTE — Discharge Instructions (Addendum)
 [facility] Instructions Hand Surgery  Wound Care: Keep your hand elevated above the level of your heart.  Do not allow it to dangle by your side.  Keep the dressing dry and do not remove it unless your doctor advises you to do so.  He will usually change it at the time of your post-op visit.  Moving your fingers is advised to stimulate circulation but will depend on the site of your surgery.  If you have a splint applied, your doctor will advise you regarding movement.  Activity: Do not drive or operate machinery today.  Rest today and then you may return to your normal activity and work as indicated by your physician.  Diet:  Drink liquids today or eat a light diet.  You may resume a regular diet tomorrow.    General expectations: Pain for two to three days. Fingers may become slightly swollen.  Call your doctor if any of the following occur: Severe pain not relieved by pain medication. Elevated temperature. Dressing soaked with blood. Inability to move fingers. White or bluish color to fingers.   No Tylenol until after 4:30pm today, if needed.   Post Anesthesia Home Care Instructions  Activity: Get plenty of rest for the remainder of the day. A responsible individual must stay with you for 24 hours following the procedure.  For the next 24 hours, DO NOT: -Drive a car -Advertising copywriter -Drink alcoholic beverages -Take any medication unless instructed by your physician -Make any legal decisions or sign important papers.  Meals: Start with liquid foods such as gelatin or soup. Progress to regular foods as tolerated. Avoid greasy, spicy, heavy foods. If nausea and/or vomiting occur, drink only clear liquids until the nausea and/or vomiting subsides. Call your physician if vomiting continues.  Special Instructions/Symptoms: Your throat may feel dry or sore from the anesthesia or the breathing tube placed in your throat during surgery. If this causes discomfort, gargle with warm  salt water. The discomfort should disappear within 24 hours.  If you had a scopolamine patch placed behind your ear for the management of post- operative nausea and/or vomiting:  1. The medication in the patch is effective for 72 hours, after which it should be removed.  Wrap patch in a tissue and discard in the trash. Wash hands thoroughly with soap and water. 2. You may remove the patch earlier than 72 hours if you experience unpleasant side effects which may include dry mouth, dizziness or visual disturbances. 3. Avoid touching the patch. Wash your hands with soap and water after contact with the patch.

## 2023-08-09 NOTE — Op Note (Signed)
 08/09/2023 [facility]  Operative Note  PREOPERATIVE DIAGNOSIS: RIGHT LONG FINGER MUCOID CYST AND DISTAL INTERPHALANGEAL ARTHRITIS; RIGHT INDEX TRIGGER DIGIT  POSTOPERATIVE DIAGNOSIS:  RIGHT LONG FINGER MUCOID CYST AND DISTAL INTERPHALANGEAL ARTHRITIS; RIGHT INDEX TRIGGER DIGIT  PROCEDURE: Procedure(s): 1.  RIGHT LONG FINGER EXCISION MUCOID CYST 2.  RIGHT LONG FINGER DEBRIDEMENT DISTAL INTERPHALANGEAL JOINT 3.  RELEASE TRIGGER FINGER/A-1 PULLEY RIGHT INDEX FINGER   SURGEON:  Betha Loa, MD  ASSISTANT:  none.  ANESTHESIA:  General.  IV FLUIDS:  Per anesthesia flow sheet.  ESTIMATED BLOOD LOSS:  Minimal.  COMPLICATIONS:  None.  SPECIMENS: Right long finger mucoid cyst to pathology  TOURNIQUET TIME:  Total Tourniquet Time Documented: Upper Arm (Right) - 18 minutes Total: Upper Arm (Right) - 18 minutes   DISPOSITION:  Stable to PACU.  LOCATION: [facility]  INDICATIONS: Jose Harvey is a 63 y.o. male with triggering of the index finger and right long finger mucoid cyst and DIP joint arthritis.  This has been injected without lasting resolution.  He wishes to proceed with surgical trigger release and excision of long finger mucoid cyst and debridement of DIP joint.  Risks, benefits and alternatives of surgery were discussed including the risk of blood loss, infection, damage to nerves, vessels, tendons, ligaments, bone, failure of surgery, need for additional surgery, complications with wound healing, continued pain, continued triggering and need for repeat surgery.  He voiced understanding of these risks and elected to proceed.  OPERATIVE COURSE:  After being identified preoperatively by myself, the patient and I agreed upon the procedure and site of procedure.  The surgical site was marked. Surgical consent had been signed. He was given IV Ancef as preoperative antibiotic prophylaxis. He was transported to the operating room and placed on the  operating room table in supine position with the right upper extremity on an arm board. General anesthesia was induced by the anesthesiologist.  The right upper extremity was prepped and draped in normal sterile orthopedic fashion. A surgical pause was performed between surgeons, anesthesia, and operating room staff, and all were in agreement as to the patient, procedure, and site of procedure.  Tourniquet at the proximal aspect of the extremity was inflated to 250 mmHg after exsanguination of the arm with an Esmarch bandage.  An incision was made at the volar aspect of the MP joint of the index finger.  This was carried into the subcutaneous tissues by spreading technique.  Bipolar electrocautery was used to obtain hemostasis.  The radial and ulnar digital nerves were protected throughout the case. The flexor sheath was identified.  The A1 pulley was identified and sharply incised.  It was released in its entirety.  The proximal 1-2 mm of the A2 pulley was vented to allow better excursion of the tendons.  The finger was placed through a range of motion and there was noted to be no catching.  The tendons were brought through the wound and any adherences released.  The wound was then copiously irrigated with sterile saline. It was closed with 4-0 nylon in a horizontal mattress fashion.  Incision was then made on the dorsum of the long finger at the level of the DIP joint.  This is hockey-stick shaped.  This is carried in subcutaneous tissues by spreading technique.  Subcutaneous tissues were thickened.  The cyst was freed up from surrounding soft tissues.  Was removed with the synovectomy rongeurs.  Was sent to pathology for examination.  The DIP joint was  entered underneath the extensor tendon.  The synovectomy rongeurs were used to perform a synovectomy and remove prominent bone at the dorsum of the distal phalanx and radial side of the distal aspect of the middle phalanx.  The wound and joint were then copiously  irrigated with sterile saline.  Wound was closed with 4-0 nylon in a horizontal mattress fashion.  Digital blocks were performed with0.25% plain Marcaine to aid in postoperative analgesia.  The wounds were dressed with sterile Xeroform, 4x4s, and wrapped lightly with a Coban dressing.  An AlumaFoam splint was used and was wrapped lightly with Coban dressing.  Tourniquet was deflated at 18 minutes.  The fingertips were pink with brisk capillary refill after deflation of the tourniquet.  The operative drapes were broken down and the patient was awoken from anesthesia safely.  He was transferred back to the stretcher and taken to the PACU in stable condition.   I will see him back in the office in 1 week for postoperative followup.  I will give him a prescription for Norco 5/325 1-2 tabs PO q6 hours prn pain, dispense # 15.    Betha Loa, MD Electronically signed, 08/09/23

## 2023-08-09 NOTE — Transfer of Care (Signed)
 Immediate Anesthesia Transfer of Care Note  Patient: Jose Harvey  Procedure(s) Performed: RIGHT LONG FINGER EXCISION MUCOID CYSTS (Right: Arm Lower) DEBRIDEMENT DISTAL INTERPHALANGEAL JOINT (Right: Arm Lower) RELEASE TRIGGER FINGER/A-1 PULLEY RIGHT INDEX FINGER (Right: Arm Lower)  Patient Location: PACU  Anesthesia Type:General  Level of Consciousness: drowsy and patient cooperative  Airway & Oxygen Therapy: Patient Spontanous Breathing and Patient connected to face mask oxygen  Post-op Assessment: Report given to RN and Post -op Vital signs reviewed and stable  Post vital signs: Reviewed and stable   Last Vitals:  Vitals Value Taken Time  BP 106/59 08/09/23 1115  Temp 36.1 C 08/09/23 1107  Pulse 62 08/09/23 1116  Resp 9 08/09/23 1116  SpO2 94 % 08/09/23 1116  Vitals shown include unfiled device data.  Last Pain:  Vitals:   08/09/23 0914  TempSrc: Oral  PainSc: 0-No pain      Patients Stated Pain Goal: 6 (08/09/23 0914)  Complications: No notable events documented.

## 2023-08-09 NOTE — H&P (Addendum)
 Jose Harvey is an 63 y.o. male.   Chief Complaint: trigger digit, mucoid cyst HPI: 63 yo male with right index finger trigger digit.  This has been injected without lasting resolution.  He wishes to proceed with surgical release.  He also notes mucoid cyst of long finger and wishes to have this removed and the dip joint debrided to try to prevent recurrence.  The mucoid cyst of the index finger has resolved.  Allergies:  Allergies  Allergen Reactions   Crestor [Rosuvastatin]     Muscle aches in higher dose    Past Medical History:  Diagnosis Date   Adrenal adenoma, left 03/2017   noted on CT   Allergic rhinitis, cause unspecified    Anxiety state, unspecified    Aortic atherosclerosis (HCC) 03/2017   noted on CT   BPV (benign positional vertigo)    COPD (chronic obstructive pulmonary disease) with emphysema (HCC)    mild followed by [facility]    Depressive disorder, not elsewhere classified    Dyspnea    history of   ED (erectile dysfunction)    Gallbladder polyp 03/2016   noted on CT   Hepatic hemangioma 03/2017   noted on CT   History of kidney stones    hx of years ago    Indigestion    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)    Mixed hyperlipidemia    Prostate cancer (HCC)    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, not stated as uncontrolled(250.51)    Type I (juvenile type) diabetes mellitus without mention of complication, not stated as uncontrolled    type I - followed by Dr Sharl Ma    Vesicoureteral reflux     Past Surgical History:  Procedure Laterality Date   CHOLECYSTECTOMY N/A 05/17/2016   Procedure: LAPAROSCOPIC CHOLECYSTECTOMY;  Surgeon: Berna Bue, MD;  Location: [facility];  Service: General;  Laterality: N/A;   cyst removed from throat      dental surgeries      IR GASTROSTOMY TUBE MOD SED  07/07/2020   IR GASTROSTOMY TUBE REMOVAL  01/21/2021   IR IMAGING GUIDED PORT INSERTION  07/07/2020   IR  REMOVAL TUN ACCESS W/ PORT W/O FL MOD SED  01/21/2021   LYMPHADENECTOMY Bilateral 07/02/2017   Procedure: BILATERAL LYMPHADENECTOMY;  Surgeon: Crista Elliot, MD;  Location: [facility];  Service: Urology;  Laterality: Bilateral;   PROSTATE BIOPSY     ROBOT ASSISTED LAPAROSCOPIC RADICAL PROSTATECTOMY N/A 07/02/2017   Procedure: XI ROBOTIC ASSISTED LAPAROSCOPIC RADICAL PROSTATECTOMY;  Surgeon: Crista Elliot, MD;  Location: [facility];  Service: Urology;  Laterality: N/A;   TONSILLECTOMY     VIDEO ASSISTED THORACOSCOPY (VATS)/ LOBECTOMY Right 05/08/2019   Procedure: Right VIDEO ASSISTED THORACOSCOPY with   MIDDLE LOBECTOMY and Enbloc portion of Upper Lobe with Node dissection, Intercostal Nerve Block;  Surgeon: Loreli Slot, MD;  Location: [facility];  Service: Thoracic;  Laterality: Right;   WISDOM TOOTH EXTRACTION      Family History: Family History  Problem Relation Age of Onset   Diabetes type I Father    Heart attack Father    Hypertension Father    Emphysema Father    CAD Father    Colon cancer Maternal Grandfather    Emphysema Paternal Grandfather    Diabetes type I Paternal Grandfather    Kidney disease Neg Hx    Liver disease Neg Hx    Breast cancer Neg Hx  Prostate cancer Neg Hx     Social History:   reports that he quit smoking about 15 years ago. His smoking use included cigarettes. He started smoking about 42 years ago. He has a 40.5 pack-year smoking history. He quit smokeless tobacco use about 10 years ago.  His smokeless tobacco use included snuff. He reports current alcohol use of about 6.0 standard drinks of alcohol per week. He reports that he does not use drugs.  Medications: Medications Prior to Admission  Medication Sig Dispense Refill   cetirizine (ZYRTEC) 10 MG tablet Take 10 mg by mouth daily.     Fenbendazole POWD by Does not apply route.     Insulin Human (INSULIN PUMP) SOLN Inject into the skin continuous. NOVOLOG 100 UNIT/ML VIAL     Multiple  Vitamins-Minerals (ICAPS AREDS 2) CAPS Take 1 capsule by mouth daily.     pantoprazole (PROTONIX) 40 MG tablet Take 40 mg by mouth daily.     rosuvastatin (CRESTOR) 5 MG tablet Take 1 tablet (5 mg total) by mouth daily. 90 tablet 3   sertraline (ZOLOFT) 100 MG tablet Take 50 mg by mouth daily.  0   Continuous Blood Gluc Sensor (FREESTYLE LIBRE 14 DAY SENSOR) MISC AS DIRECTED CHANGE SENSOR EVERY 14 DAYS 28     NOVOLOG 100 UNIT/ML injection Averages about 30 units per day via insulin pump      No results found for this or any previous visit (from the past 48 hours).  No results found.    Height 5\' 11"  (1.803 m), weight 77.1 kg.  General appearance: alert, cooperative, and appears stated age Head: Normocephalic, without obvious abnormality, atraumatic Neck: supple, symmetrical, trachea midline Extremities: Intact sensation and capillary refill all digits.  +epl/fpl/io.  No wounds.  Skin: Skin color, texture, turgor normal. No rashes or lesions Neurologic: Grossly normal Incision/Wound: none  Assessment/Plan Right index finger trigger digit and long finger mucoid cyst and dip joint arthritis.  Non operative and operative treatment options have been discussed with the patient and patient wishes to proceed with operative treatment. Risks, benefits, and alternatives of surgery have been discussed and the patient agrees with the plan of care.   Betha Loa 08/09/2023, 8:50 AM

## 2023-08-10 ENCOUNTER — Encounter (HOSPITAL_BASED_OUTPATIENT_CLINIC_OR_DEPARTMENT_OTHER): Payer: Self-pay | Admitting: Orthopedic Surgery

## 2023-08-10 LAB — SURGICAL PATHOLOGY

## 2023-08-17 DIAGNOSIS — M6740 Ganglion, unspecified site: Secondary | ICD-10-CM | POA: Diagnosis not present

## 2023-08-17 DIAGNOSIS — M19041 Primary osteoarthritis, right hand: Secondary | ICD-10-CM | POA: Diagnosis not present

## 2023-08-17 DIAGNOSIS — M65321 Trigger finger, right index finger: Secondary | ICD-10-CM | POA: Diagnosis not present

## 2023-09-04 DIAGNOSIS — D120 Benign neoplasm of cecum: Secondary | ICD-10-CM | POA: Diagnosis not present

## 2023-09-04 DIAGNOSIS — D124 Benign neoplasm of descending colon: Secondary | ICD-10-CM | POA: Diagnosis not present

## 2023-09-04 DIAGNOSIS — K5730 Diverticulosis of large intestine without perforation or abscess without bleeding: Secondary | ICD-10-CM | POA: Diagnosis not present

## 2023-09-04 DIAGNOSIS — Z1211 Encounter for screening for malignant neoplasm of colon: Secondary | ICD-10-CM | POA: Diagnosis not present

## 2023-09-24 DIAGNOSIS — M6740 Ganglion, unspecified site: Secondary | ICD-10-CM | POA: Diagnosis not present

## 2023-09-24 DIAGNOSIS — M65321 Trigger finger, right index finger: Secondary | ICD-10-CM | POA: Diagnosis not present

## 2023-09-24 DIAGNOSIS — M19041 Primary osteoarthritis, right hand: Secondary | ICD-10-CM | POA: Diagnosis not present

## 2023-09-28 DIAGNOSIS — E1065 Type 1 diabetes mellitus with hyperglycemia: Secondary | ICD-10-CM | POA: Diagnosis not present

## 2023-09-28 DIAGNOSIS — Z9641 Presence of insulin pump (external) (internal): Secondary | ICD-10-CM | POA: Diagnosis not present

## 2023-09-28 DIAGNOSIS — Z794 Long term (current) use of insulin: Secondary | ICD-10-CM | POA: Diagnosis not present

## 2023-09-28 DIAGNOSIS — E103293 Type 1 diabetes mellitus with mild nonproliferative diabetic retinopathy without macular edema, bilateral: Secondary | ICD-10-CM | POA: Diagnosis not present

## 2023-10-30 ENCOUNTER — Other Ambulatory Visit: Payer: Self-pay

## 2023-10-30 DIAGNOSIS — C884 Extranodal marginal zone b-cell lymphoma of mucosa-associated lymphoid tissue (malt-lymphoma) not having achieved remission: Secondary | ICD-10-CM

## 2023-10-30 DIAGNOSIS — C01 Malignant neoplasm of base of tongue: Secondary | ICD-10-CM

## 2023-10-30 NOTE — Progress Notes (Signed)
 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 F/u  for Pulmonary extranodal MALT Lymphoma  HISTORY OF PRESENTING ILLNESS:  Please see previous note for details on initial presentation  INTERVAL HISTORY:   Jose Harvey is a wonderful 63 y.o. male who is here for evaluation and management of head and neck squamous cell carcinoma. [name] last visit with us  was on 10/25/2022 and was recovering from a bronchitis infection at the time. He also noted having a COVID-19 infection in January 2024 affecting his breathing habits.  Patient reports that he has been doing well overall over the last year.   He denies any new symptoms such as swallowing issues, breathing issues, coughing, fever, chills, night sweats, abdominal pain, or leg swelling. He has been eating well and sleeping well.   He follows with Dr. Donalee Fruits every 6 months and reports that there are no new or concerning findings from ENT standpoint.   In regards to his prostate cancer, his PSA levels are monitored by his PCP. He reports that his PSA levels have remained undetectable.   Patient notes that he is retired at this time.   Patient reports no change in breathing when walking.   Patient is not smoking at this time and reports that he quit smoking 15 years ago.   Patient reports that he has been taking Fenbendazole due to his own decision due to believing it might help his lymphoma. We  discussed that I would not recommend he take this medication.     MEDICAL HISTORY:  Past Medical History:  Diagnosis Date   Adrenal adenoma, left 03/2017   noted on CT   Allergic rhinitis, cause unspecified    Anxiety state, unspecified    Aortic atherosclerosis (HCC) 03/2017   noted on CT   BPV (benign positional vertigo)    COPD (chronic obstructive pulmonary disease) with emphysema (HCC)    mild followed by [facility]    Depressive disorder, not elsewhere classified    Dyspnea    history of   ED (erectile dysfunction)    Gallbladder polyp 03/2016   noted on CT   Hepatic hemangioma 03/2017   noted on CT   History of kidney stones    hx of years ago    Indigestion    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)    Mixed hyperlipidemia    Prostate cancer (HCC)    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, not stated as uncontrolled(250.51)    Type I (juvenile type) diabetes mellitus without mention of complication, not stated as uncontrolled    type I - followed by Dr Kathyanne Parkers    Vesicoureteral reflux     SURGICAL HISTORY: Past Surgical History:  Procedure Laterality Date   CHOLECYSTECTOMY N/A 05/17/2016   Procedure: LAPAROSCOPIC CHOLECYSTECTOMY;  Surgeon: Adalberto Acton, MD;  Location: [facility];  Service: General;  Laterality: N/A;   CYST EXCISION Right 08/09/2023   Procedure: RIGHT LONG FINGER EXCISION MUCOID CYSTS;  Surgeon: Brunilda Capra, MD;  Location: [facility];  Service:  Orthopedics;  Laterality: Right;  45 MIN   cyst removed from throat      dental surgeries      IR GASTROSTOMY TUBE MOD SED  07/07/2020   IR GASTROSTOMY TUBE REMOVAL  01/21/2021   IR IMAGING GUIDED PORT INSERTION  07/07/2020   IR REMOVAL TUN ACCESS W/ PORT W/O FL MOD SED  01/21/2021   LYMPHADENECTOMY Bilateral 07/02/2017   Procedure: BILATERAL LYMPHADENECTOMY;  Surgeon: Samson Croak, MD;  Location: [facility];  Service: Urology;  Laterality:  Bilateral;   PROSTATE BIOPSY     ROBOT ASSISTED LAPAROSCOPIC RADICAL PROSTATECTOMY N/A 07/02/2017   Procedure: XI ROBOTIC ASSISTED LAPAROSCOPIC RADICAL PROSTATECTOMY;  Surgeon: Samson Croak, MD;  Location: [facility];  Service: Urology;  Laterality: N/A;   TENDON EXPLORATION Right 08/09/2023   Procedure: DEBRIDEMENT DISTAL INTERPHALANGEAL JOINT;  Surgeon: Brunilda Capra, MD;  Location: [facility];  Service: Orthopedics;  Laterality: Right;   TONSILLECTOMY     TRIGGER FINGER RELEASE Right 08/09/2023   Procedure: RELEASE TRIGGER FINGER/A-1 PULLEY RIGHT INDEX FINGER;  Surgeon: Brunilda Capra, MD;  Location: [facility];  Service: Orthopedics;  Laterality: Right;   VIDEO ASSISTED THORACOSCOPY (VATS)/ LOBECTOMY Right 05/08/2019   Procedure: Right VIDEO ASSISTED THORACOSCOPY with   MIDDLE LOBECTOMY and Enbloc portion of Upper Lobe with Node dissection, Intercostal Nerve Block;  Surgeon: Zelphia Higashi, MD;  Location: [facility];  Service: Thoracic;  Laterality: Right;   WISDOM TOOTH EXTRACTION      SOCIAL HISTORY: Social History   Socioeconomic History   Marital status: Single    Spouse name: Not on file   Number of children: 2   Years of education: Not on file   Highest education level: Not on file  Occupational History    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  Tobacco Use   Smoking status: Former    Current packs/day: 0.00    Average packs/day: 1.5 packs/day for 27.0 years (40.5 ttl pk-yrs)    Types: Cigarettes    Start date: 06/05/1981    Quit date: 06/05/2008    Years since quitting: 15.4   Smokeless tobacco: Former    Types: Snuff    Quit date: 2015  Vaping Use   Vaping status: Never Used  Substance and Sexual Activity   Alcohol use: Yes    Alcohol/week: 6.0 standard drinks of alcohol    Types: 6 Cans of beer per week    Comment: occasional 6pk per week   Drug use: No   Sexual activity: Not Currently  Other Topics Concern   Not on file  Social History Narrative    Patient divorced. Patient has two grown daughters but neither lives locally.   Social Drivers of Corporate investment banker Strain: Low Risk  (06/11/2020)   Overall Financial Resource Strain (CARDIA)    Difficulty of Paying Living Expenses: Not very hard  Food Insecurity: Low Risk  (09/24/2023)   Received from [facility]   Hunger Vital Sign    Worried About Running Out of Food in the Last Year: Never true    Ran Out of Food in the Last Year: Never true  Transportation Needs: No Transportation Needs (09/24/2023)   Received from [facility]    In the past 12 months, has lack of reliable transportation kept you from medical appointments, meetings, work or from getting things needed for daily living? : No  Physical Activity: Not on file  Stress: No Stress Concern Present (  06/11/2020)   [employer] [employer] - Occupational Stress Questionnaire    Feeling of Stress : Only a little  Social Connections: Unknown (06/11/2020)   Social Connection and Isolation Panel [NHANES]    Frequency of Communication with Friends and Family: More than three times a week    Frequency of Social Gatherings with Friends and Family: More than three times a week    Attends Religious Services: More than 4 times per year    Active Member of [employer] or Organizations: Yes    Attends Engineer, structural: More than 4 times per year    Marital Status: Not on file  Intimate Partner Violence: Not on file    FAMILY HISTORY: Family History  Problem Relation Age of Onset   Diabetes type I Father    Heart attack Father    Hypertension Father    Emphysema Father    CAD Father    Colon cancer Maternal Grandfather    Emphysema Paternal Grandfather    Diabetes type I Paternal Grandfather    Kidney disease Neg Hx    Liver disease Neg Hx    Breast cancer Neg Hx    Prostate cancer Neg Hx     ALLERGIES:  is allergic to crestor  [rosuvastatin ].  MEDICATIONS:  Current  Outpatient Medications  Medication Sig Dispense Refill   cetirizine (ZYRTEC) 10 MG tablet Take 10 mg by mouth daily.     Continuous Blood Gluc Sensor (FREESTYLE LIBRE 14 DAY SENSOR) MISC AS DIRECTED CHANGE SENSOR EVERY 14 DAYS 28     Fenbendazole POWD by Does not apply route.     HYDROcodone -acetaminophen  (NORCO/VICODIN) 5-325 MG tablet 1-2 tabs PO q6 hours prn pain 15 tablet 0   Insulin  Human (INSULIN  PUMP) SOLN Inject into the skin continuous. NOVOLOG  100 UNIT/ML VIAL     Multiple Vitamins-Minerals (ICAPS AREDS 2) CAPS Take 1 capsule by mouth daily.     NOVOLOG  100 UNIT/ML injection Averages about 30 units per day via insulin  pump     pantoprazole  (PROTONIX ) 40 MG tablet Take 40 mg by mouth daily.     rosuvastatin  (CRESTOR ) 5 MG tablet Take 1 tablet (5 mg total) by mouth daily. 90 tablet 3   sertraline  (ZOLOFT ) 100 MG tablet Take 50 mg by mouth daily.  0   No current facility-administered medications for this visit.    REVIEW OF SYSTEMS:    10 Point review of Systems was done is negative except as noted above.   PHYSICAL EXAMINATION: ECOG PERFORMANCE STATUS: 0 - Asymptomatic  Vitals:   10/31/23 0909  BP: 126/84  Pulse: 60  Resp: 20  Temp: (!) 97.3 F (36.3 C)  SpO2: 98%     GENERAL:alert, in no acute distress and comfortable SKIN: no acute rashes, no significant lesions EYES: conjunctiva are pink and non-injected, sclera anicteric OROPHARYNX: MMM, no exudates, no oropharyngeal erythema or ulceration NECK: supple, no JVD LYMPH:  no palpable lymphadenopathy in the cervical, axillary or inguinal regions LUNGS: clear to auscultation b/l with normal respiratory effort HEART: regular rate & rhythm ABDOMEN:  normoactive bowel sounds , non tender, not distended. Extremity: no pedal edema PSYCH: alert & oriented x 3 with fluent speech NEURO: no focal motor/sensory deficits   LABORATORY DATA:  I have reviewed the data as listed  .    Latest Ref Rng & Units 10/31/2023     8:21 AM 10/25/2022    7:47 AM 10/19/2021    8:20 AM  CBC  WBC  4.0 - 10.5 K/uL 5.2  5.3  4.7   Hemoglobin 13.0 - 17.0 g/dL 16.1  09.6  04.5   Hematocrit 39.0 - 52.0 % 41.7  42.6  41.1   Platelets 150 - 400 K/uL 208  233  216     .    Latest Ref Rng & Units 10/31/2023    8:21 AM 08/06/2023   11:02 AM 10/25/2022    7:47 AM  CMP  Glucose 70 - 99 mg/dL 409  811  914   BUN 8 - 23 mg/dL 19  19  20    Creatinine 0.61 - 1.24 mg/dL 7.82  9.56  2.13   Sodium 135 - 145 mmol/L 139  141  139   Potassium 3.5 - 5.1 mmol/L 4.8  4.6  4.1   Chloride 98 - 111 mmol/L 105  105  104   CO2 22 - 32 mmol/L 29  25  30    Calcium  8.9 - 10.3 mg/dL 9.4  9.2  8.9   Total Protein 6.5 - 8.1 g/dL 7.0   6.5   Total Bilirubin 0.0 - 1.2 mg/dL 0.5   0.6   Alkaline Phos 38 - 126 U/L 67   73   AST 15 - 41 U/L 17   27   ALT 0 - 44 U/L 17   35    . Lab Results  Component Value Date   LDH 111 10/31/2023       05/26/2020 Right Cervical LN Bx ([id number]):   05/08/2019 Surgical Pathology [phone]):    05/08/2019 Surgical Pathology [phone]):    RADIOGRAPHIC STUDIES: I have personally reviewed the radiological images as listed and agreed with the findings in the report. No results found.  ASSESSMENT & PLAN:   63 y.o. 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 Masslike area of ground-glass straddles the minor fissure with septal thickening and fissural retraction. While the lesion has increased in size minimally from 09/30/2018, there is clear enlargement from baseline examination on 02/16/2016. Therefore, lesion is characterized as worrisome for adenocarcinoma, Lung-RADS 4B, suspicious. -02/25/2016 PET/CT ([id number]) revealed "Dominant 8 mm right lower lobe pulmonary nodule shows no significant FDG uptake. Other scattered less than 5 mm bilateral pulmonary nodules also show no FDG  uptake but are too small to characterize by PET. Right middle lobe ground-glass opacity shows low-grade FDG uptake, and differential diagnosis includes inflammatory or infectious processes and low-grade adenocarcinoma. Multiple low-attenuation liver masses show no hypermetabolic activity compared to normal hepatic parenchyma. These remain indeterminate and cannot be characterized without IV contrast. Abdomen MRI without and with contrast recommended for further evaluation. 2.5 cm left adrenal mass shows mild FDG uptake, and remains indeterminate. Differential diagnosis includes atypical adenoma, metastasis, pheochromocytoma, or less likely adrenal cortical carcinoma."  3) Hx of Prostate cancer PLAN:  -Discussed lab results on 10/31/2023 in detail with  patient. CBC normal, showed WBC of 5.2K, hemoglobin of 14.0, and platelets of 208K. -CMP normal -LDH normal, improved to 111 currently, from 132 one year ago -did not feel an enlarged spleen or liver during physical examination - no lab or clinical evidence of recurrence/progression of head and neck Squamous cell carcinoma or Pulmonary MALT lymphoma.  -discussed that if his PSA level is above 10 or is rapidly changing, or if patient has any new urinary symptoms, that would be a reason for a PSMA PET scan. There is no indication for PSMA pet scan at this time.  -Patient's last CT chest scan in was in May 2024. Will plan for CT scan in about 1 year unless it is ordered by him pulmonologist sooner if needed -given that his blood counts are normal and in the absence of symptoms, and the fact that his marginal zone lymphoma is very slow growing, discussed that we will not over-scan him in order to limit radiation exposure in the long term -educated patient on potential reasons to consider re-imaging him if needed, including if he has new symptoms -discussed importance of following up with ENT doctor, Dr. Donalee Fruits, every 4 months for local examinations for throat cancer -Continue to follow with PCP for monitoring of PSA levels -would not recommend that patient takes Fenbendazole given that there are no studies to show that it would help with lymphomas. Discussed that toxicities from this medication can be bothersome, such as neuropathy, GI issues, or liver issues -Continue Vitamin B Complex daily.  -answered all of patient's questions in detail  FOLLOW UP: CT chest wo contrast in 11 months RTC with Dr Salomon Cree with labs in 12 months  The total time spent in the appointment was 30 minutes* .  All of the patient's questions were answered with apparent satisfaction. The patient knows to call the clinic with any problems, questions or concerns.   Jacquelyn Matt MD MS AAHIVMS [facility] Hematology/Oncology  Physician [facility]  .*Total Encounter Time as defined by the Centers for Medicare and Medicaid Services includes, in addition to the face-to-face time of a patient visit (documented in the note above) non-face-to-face time: obtaining and reviewing outside history, ordering and reviewing medications, tests or procedures, care coordination (communications with other health care professionals or caregivers) and documentation in the medical record.     I,Mitra Faeizi,acting as a Neurosurgeon for Jacquelyn Matt, MD.,have documented all relevant documentation on the behalf of Jacquelyn Matt, MD,as directed by  Jacquelyn Matt, MD while in the presence of Jacquelyn Matt, MD.  .I have reviewed the above documentation for accuracy and completeness, and I agree with the above. .Ruthell Feigenbaum Kishore Arly Salminen MD

## 2023-10-31 ENCOUNTER — Inpatient Hospital Stay (HOSPITAL_BASED_OUTPATIENT_CLINIC_OR_DEPARTMENT_OTHER): Payer: BC Managed Care – PPO | Admitting: Hematology

## 2023-10-31 ENCOUNTER — Inpatient Hospital Stay: Payer: BC Managed Care – PPO | Attending: Hematology

## 2023-10-31 VITALS — BP 126/84 | HR 60 | Temp 97.3°F | Resp 20 | Wt 176.4 lb

## 2023-10-31 DIAGNOSIS — Z833 Family history of diabetes mellitus: Secondary | ICD-10-CM | POA: Diagnosis not present

## 2023-10-31 DIAGNOSIS — Z9079 Acquired absence of other genital organ(s): Secondary | ICD-10-CM | POA: Diagnosis not present

## 2023-10-31 DIAGNOSIS — Z8249 Family history of ischemic heart disease and other diseases of the circulatory system: Secondary | ICD-10-CM | POA: Diagnosis not present

## 2023-10-31 DIAGNOSIS — Z87442 Personal history of urinary calculi: Secondary | ICD-10-CM | POA: Insufficient documentation

## 2023-10-31 DIAGNOSIS — Z86018 Personal history of other benign neoplasm: Secondary | ICD-10-CM | POA: Diagnosis not present

## 2023-10-31 DIAGNOSIS — E1039 Type 1 diabetes mellitus with other diabetic ophthalmic complication: Secondary | ICD-10-CM | POA: Diagnosis not present

## 2023-10-31 DIAGNOSIS — C61 Malignant neoplasm of prostate: Secondary | ICD-10-CM | POA: Diagnosis not present

## 2023-10-31 DIAGNOSIS — Z85810 Personal history of malignant neoplasm of tongue: Secondary | ICD-10-CM | POA: Insufficient documentation

## 2023-10-31 DIAGNOSIS — Z9089 Acquired absence of other organs: Secondary | ICD-10-CM | POA: Diagnosis not present

## 2023-10-31 DIAGNOSIS — Z79899 Other long term (current) drug therapy: Secondary | ICD-10-CM | POA: Diagnosis not present

## 2023-10-31 DIAGNOSIS — C884 Extranodal marginal zone b-cell lymphoma of mucosa-associated lymphoid tissue (malt-lymphoma) not having achieved remission: Secondary | ICD-10-CM

## 2023-10-31 DIAGNOSIS — Z9049 Acquired absence of other specified parts of digestive tract: Secondary | ICD-10-CM | POA: Insufficient documentation

## 2023-10-31 DIAGNOSIS — C770 Secondary and unspecified malignant neoplasm of lymph nodes of head, face and neck: Secondary | ICD-10-CM | POA: Insufficient documentation

## 2023-10-31 DIAGNOSIS — Z87891 Personal history of nicotine dependence: Secondary | ICD-10-CM | POA: Diagnosis not present

## 2023-10-31 DIAGNOSIS — C01 Malignant neoplasm of base of tongue: Secondary | ICD-10-CM

## 2023-10-31 DIAGNOSIS — Z800 Family history of malignant neoplasm of digestive organs: Secondary | ICD-10-CM | POA: Insufficient documentation

## 2023-10-31 LAB — CBC WITH DIFFERENTIAL (CANCER CENTER ONLY)
Abs Immature Granulocytes: 0.01 10*3/uL (ref 0.00–0.07)
Basophils Absolute: 0.1 10*3/uL (ref 0.0–0.1)
Basophils Relative: 1 %
Eosinophils Absolute: 0.5 10*3/uL (ref 0.0–0.5)
Eosinophils Relative: 9 %
HCT: 41.7 % (ref 39.0–52.0)
Hemoglobin: 14 g/dL (ref 13.0–17.0)
Immature Granulocytes: 0 %
Lymphocytes Relative: 20 %
Lymphs Abs: 1.1 10*3/uL (ref 0.7–4.0)
MCH: 29.9 pg (ref 26.0–34.0)
MCHC: 33.6 g/dL (ref 30.0–36.0)
MCV: 88.9 fL (ref 80.0–100.0)
Monocytes Absolute: 0.6 10*3/uL (ref 0.1–1.0)
Monocytes Relative: 11 %
Neutro Abs: 3 10*3/uL (ref 1.7–7.7)
Neutrophils Relative %: 59 %
Platelet Count: 208 10*3/uL (ref 150–400)
RBC: 4.69 MIL/uL (ref 4.22–5.81)
RDW: 13.3 % (ref 11.5–15.5)
WBC Count: 5.2 10*3/uL (ref 4.0–10.5)
nRBC: 0 % (ref 0.0–0.2)

## 2023-10-31 LAB — CMP (CANCER CENTER ONLY)
ALT: 17 U/L (ref 0–44)
AST: 17 U/L (ref 15–41)
Albumin: 4.2 g/dL (ref 3.5–5.0)
Alkaline Phosphatase: 67 U/L (ref 38–126)
Anion gap: 5 (ref 5–15)
BUN: 19 mg/dL (ref 8–23)
CO2: 29 mmol/L (ref 22–32)
Calcium: 9.4 mg/dL (ref 8.9–10.3)
Chloride: 105 mmol/L (ref 98–111)
Creatinine: 0.91 mg/dL (ref 0.61–1.24)
GFR, Estimated: >60 mL/min (ref >60)
Glucose, Bld: 143 mg/dL — ABNORMAL HIGH (ref 70–99)
Potassium: 4.8 mmol/L (ref 3.5–5.1)
Sodium: 139 mmol/L (ref 135–145)
Total Bilirubin: 0.5 mg/dL (ref 0.0–1.2)
Total Protein: 7 g/dL (ref 6.5–8.1)

## 2023-10-31 LAB — MAGNESIUM: Magnesium: 2 mg/dL (ref 1.7–2.4)

## 2023-10-31 LAB — LACTATE DEHYDROGENASE: LDH: 111 U/L (ref 98–192)

## 2023-11-01 ENCOUNTER — Ambulatory Visit: Admitting: Pulmonary Disease

## 2023-11-01 ENCOUNTER — Encounter: Payer: Self-pay | Admitting: Pulmonary Disease

## 2023-11-01 VITALS — BP 130/76 | HR 70 | Ht 71.0 in | Wt 175.6 lb

## 2023-11-01 DIAGNOSIS — J449 Chronic obstructive pulmonary disease, unspecified: Secondary | ICD-10-CM | POA: Diagnosis not present

## 2023-11-01 DIAGNOSIS — R0609 Other forms of dyspnea: Secondary | ICD-10-CM | POA: Diagnosis not present

## 2023-11-01 DIAGNOSIS — C01 Malignant neoplasm of base of tongue: Secondary | ICD-10-CM

## 2023-11-01 MED ORDER — UMECLIDINIUM-VILANTEROL 62.5-25 MCG/ACT IN AEPB: 1.0000 | INHALATION_SPRAY | Freq: Every day | RESPIRATORY_TRACT | 11 refills | Status: AC

## 2023-11-01 NOTE — Patient Instructions (Signed)
 Nice to meet you  In the past would like an inhaler called Symbicort  was prescribed.  Will try some little different, use Anoro 1 puff once a day.  This is a dry powder inhaler so a bit different than the puffer.  Has 2 medicines to open up the airways and get as much air out as possible hopefully will help with shortness of breath.  If not improved we can stop it, we can consider repeating pulmonary function test and other testing in the future if needed  Return to clinic in 3 months or sooner as needed with Dr. Marygrace Snellen

## 2023-11-01 NOTE — Progress Notes (Signed)
 @Patient  ID: Jose Harvey, male    DOB: 18-Feb-1961, 63 y.o.   MRN: [id number]  Chief Complaint  Patient presents with   Consult    Consult on copd diagnosis.    Referring provider: Arva Lathe,*  HPI:   63 y.o. man whom are seen for evaluation of dyspnea on exertion.  Multiple prior pulmonary notes reviewed.  Multiple cardiothoracic surgery notes reviewed.  Multiple oncology notes reviewed.  Seems like patient had dyspnea several years ago.  For seen in pulmonary clinic in 2017.  PFTs were largely normal.  There was some concern for COPD with underlying emphysema on imaging.  Although no fixed obstruction on PFTs.  Is placed on Symbicort .  Per notes it seemed to help.  They follow the lung nodule.  Eventually it grew.  He was sent for resection.  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  He is followed by oncology and gets serial scans now spaced out every couple years.  He states has had dyspnea for a long time at least 10 years.  Gradually getting worse.  Does fine walking on flat surfaces long distances.  But when he time he is carrying something bending over etc. he becomes short of breath.  Has to stop.  No time of day when things are better or worse.  No position make things better or worse.  No seasonal or environmental and factors he can identify to make things better or worse.  He states inhalers in the past prescribed by prior pulmonologist did not help.  He has been seen by cardiology in the past everything checked out fine.  Although it has been sometime since he has been seen there.  Reviewed most recent CT scan 10/2022 reveals mild emphysematous changes, evidence of wedge resection with residual linear scar, otherwise clear lungs.  Questionaires / Pulmonary Flowsheets:   ACT:      No data to display          MMRC:     No data to display          Epworth:      No data to display          Tests:   FENO:  No results found for:  "NITRICOXIDE"  PFT:    Latest Ref Rng & Units 04/21/2019    2:24 PM 07/30/2015    9:55 AM  PFT Results  FVC-Pre L 3.86  3.87   FVC-Predicted Pre % 75  74   FVC-Post L 3.76  3.82   FVC-Predicted Post % 73  72   Pre FEV1/FVC % % 74  78   Post FEV1/FCV % % 79  79   FEV1-Pre L 2.87  3.03   FEV1-Predicted Pre % 73  76   FEV1-Post L 2.97  3.03   DLCO uncorrected ml/min/mmHg 24.31  25.11   DLCO UNC% % 82  71   DLCO corrected ml/min/mmHg  25.87   DLCO COR %Predicted %  73   DLVA Predicted % 103  102   TLC L 7.16  5.84   TLC % Predicted % 96  79   RV % Predicted % 142  92   04/2019 personally reviewed no fixed obstruction, lung volumes consistent with air trapping, DLCO within normal limits  WALK:      No data to display          Imaging: Personally reviewed and as per EMR in discussion this note No results found.  Lab Results:  Personally reviewed CBC    Component Value Date/Time   WBC 5.2 10/31/2023 0821   WBC 7.2 09/29/2020 0921   RBC 4.69 10/31/2023 0821   HGB 14.0 10/31/2023 0821   HCT 41.7 10/31/2023 0821   PLT 208 10/31/2023 0821   MCV 88.9 10/31/2023 0821   MCH 29.9 10/31/2023 0821   MCHC 33.6 10/31/2023 0821   RDW 13.3 10/31/2023 0821   LYMPHSABS 1.1 10/31/2023 0821   MONOABS 0.6 10/31/2023 0821   EOSABS 0.5 10/31/2023 0821   BASOSABS 0.1 10/31/2023 0821    BMET    Component Value Date/Time   NA 139 10/31/2023 0821   K 4.8 10/31/2023 0821   CL 105 10/31/2023 0821   CO2 29 10/31/2023 0821   GLUCOSE 143 (H) 10/31/2023 0821   BUN 19 10/31/2023 0821   CREATININE 0.91 10/31/2023 0821   CALCIUM  9.4 10/31/2023 0821   GFRNONAA >60 10/31/2023 0821   GFRAA >60 02/19/2020 1422    BNP No results found for: "BNP"  ProBNP No results found for: "PROBNP"  Specialty Problems       Pulmonary Problems   Paroxysmal nocturnal dyspnea   COPD (chronic obstructive pulmonary disease) (HCC)   Throat fullness   DOE (dyspnea on exertion)   Cancer of base of  tongue (HCC)    Allergies  Allergen Reactions   Crestor  [Rosuvastatin ]     Muscle aches in higher dose    Immunization History  Administered Date(s) Administered   H1N1 02/23/2010   Hepatitis B, ADULT 03/19/2014, 10/16/2014, 02/22/2015   Influenza Inj Mdck Quad Pf 03/03/2019   Influenza Split 02/03/2009, 02/23/2010, 03/19/2013, 03/17/2014, 02/22/2015   Influenza, Seasonal, Injecte, Preservative Fre 02/04/2015   Influenza,inj,Quad PF,6+ Mos 04/04/2011, 03/11/2012, 02/29/2016, 02/20/2017, 04/01/2018, 02/27/2019, 04/27/2020   Influenza-Unspecified 02/03/2009, 04/04/2011, 03/11/2012, 03/19/2013, 02/20/2017   PFIZER(Purple Top)SARS-COV-2 Vaccination 10/12/2019, 11/04/2019, 06/11/2020   Pneumococcal Polysaccharide-23 01/04/2000, 11/24/2010, 04/01/2018   Pneumococcal-Unspecified 01/04/2000, 11/24/2010   Tdap 11/24/2010   Zoster Recombinant(Shingrix) 03/29/2019   Zoster, Live 04/01/2019, 06/05/2019    Past Medical History:  Diagnosis Date   Adrenal adenoma, left 03/2017   noted on CT   Allergic rhinitis, cause unspecified    Anxiety state, unspecified    Aortic atherosclerosis (HCC) 03/2017   noted on CT   BPV (benign positional vertigo)    COPD (chronic obstructive pulmonary disease) with emphysema (HCC)    mild followed by [facility]    Depressive disorder, not elsewhere classified    Dyspnea    history of   ED (erectile dysfunction)    Gallbladder polyp 03/2016   noted on CT   Hepatic hemangioma 03/2017   noted on CT   History of kidney stones    hx of years ago    Indigestion    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4  Smokeless Tobacco Former   Types:  Snuff   Quit date: 2015   Counseling given: Not Answered   Continue to not smoke  Outpatient Encounter Medications as of 11/01/2023  Medication Sig   cetirizine (ZYRTEC) 10 MG tablet Take 10 mg by mouth daily.   Continuous Blood Gluc Sensor (FREESTYLE LIBRE 14 DAY SENSOR) MISC AS DIRECTED CHANGE SENSOR EVERY 14 DAYS 28   Fenbendazole POWD by Does not apply route.   Insulin  Human (INSULIN  PUMP) SOLN Inject into the skin continuous. NOVOLOG  100 UNIT/ML VIAL   Multiple Vitamins-Minerals (ICAPS AREDS 2) CAPS Take 1 capsule by mouth daily.   NOVOLOG  100 UNIT/ML injection Averages about 30 units per day via insulin  pump   pantoprazole  (PROTONIX ) 40 MG tablet Take 40 mg by mouth daily.   rosuvastatin  (CRESTOR ) 5 MG tablet Take 1 tablet (5 mg total) by mouth daily.   sertraline  (ZOLOFT ) 100 MG tablet Take 50 mg by mouth daily.   umeclidinium-vilanterol (ANORO ELLIPTA ) 62.5-25 MCG/ACT AEPB Inhale 1 puff into the lungs daily.   HYDROcodone -acetaminophen  (NORCO/VICODIN) 5-325 MG tablet 1-2 tabs PO q6 hours prn pain   No facility-administered encounter medications on file as of 11/01/2023.     Review of Systems  Review of Systems  No chest pain with exertion.  No orthopnea or PND.  Comprehensive review of systems otherwise negative. Physical Exam  BP 130/76 (BP Location: Left Arm, Patient Position: Sitting, Cuff Size: Normal)   Pulse 70   Ht 5\' 11"  (1.803 m)   Wt 175 lb 9.6 oz (79.7 kg)   SpO2 96%   BMI 24.49 kg/m   Wt Readings from Last 5 Encounters:  11/01/23 175 lb 9.6 oz (79.7 kg)  10/31/23 176 lb 6.4 oz (80 kg)  08/09/23 174 lb 9.7 oz (79.2 kg)  10/25/22 171 lb 8 oz (77.8 kg)  10/19/21 167  lb 4.8 oz (75.9 kg)    BMI Readings from Last 5 Encounters:  11/01/23 24.49 kg/m  10/31/23 24.60 kg/m  08/09/23 24.35 kg/m  10/25/22 23.92 kg/m  10/19/21 23.33 kg/m     Physical Exam General: Sitting in chair, no acute distress Eyes: EOMI, no icterus Neck: Supple, no JVP Pulmonary: Clear normal work of breathing Abdomen: Nondistended MSK: No synovitis, no joint effusion Cardiovascular: Regular rate and rhythm Neuro normal gait, no weakness Psych: Normal mood, full affect   Assessment & Plan:   Dyspnea on exertion: Suspect multifactorial including deconditioning.  High suspicion for air trapping contributing, related to emphysema.  As demonstrated on PFTs in 2020.  Likely worsened over time with loss of lung parenchyma after lung resection in 2020.  Trial of dual bronchodilators with Anoro.  He stated the inhaler in the past did not help.  On review of records of the Symbicort .  Prior pulmonary notes states that this did help.  Will avoid for now, can try in the future if Anoro not beneficial.  Consider repeat CT scan, PFTs in the future if not improving.   Return in about 3 months (around 02/01/2024) for f/u Dr. Marygrace Snellen.   Guerry Leek, MD 11/01/2023

## 2023-12-12 DIAGNOSIS — Q432 Other congenital functional disorders of colon: Secondary | ICD-10-CM | POA: Diagnosis not present

## 2023-12-12 DIAGNOSIS — K635 Polyp of colon: Secondary | ICD-10-CM | POA: Diagnosis not present

## 2023-12-12 DIAGNOSIS — D120 Benign neoplasm of cecum: Secondary | ICD-10-CM | POA: Diagnosis not present

## 2024-01-08 ENCOUNTER — Telehealth: Payer: Self-pay | Admitting: Radiation Oncology

## 2024-01-08 NOTE — Telephone Encounter (Signed)
 Received medical record request from [facility] . Forwarded request to [employer].

## 2024-01-15 DIAGNOSIS — C109 Malignant neoplasm of oropharynx, unspecified: Secondary | ICD-10-CM | POA: Diagnosis not present

## 2024-01-15 DIAGNOSIS — Z923 Personal history of irradiation: Secondary | ICD-10-CM | POA: Diagnosis not present

## 2024-01-16 DIAGNOSIS — D2362 Other benign neoplasm of skin of left upper limb, including shoulder: Secondary | ICD-10-CM | POA: Diagnosis not present

## 2024-01-16 DIAGNOSIS — L821 Other seborrheic keratosis: Secondary | ICD-10-CM | POA: Diagnosis not present

## 2024-01-16 DIAGNOSIS — L570 Actinic keratosis: Secondary | ICD-10-CM | POA: Diagnosis not present

## 2024-01-16 DIAGNOSIS — D485 Neoplasm of uncertain behavior of skin: Secondary | ICD-10-CM | POA: Diagnosis not present

## 2024-01-16 DIAGNOSIS — D2272 Melanocytic nevi of left lower limb, including hip: Secondary | ICD-10-CM | POA: Diagnosis not present

## 2024-01-16 DIAGNOSIS — D225 Melanocytic nevi of trunk: Secondary | ICD-10-CM | POA: Diagnosis not present

## 2024-02-05 DIAGNOSIS — J441 Chronic obstructive pulmonary disease with (acute) exacerbation: Secondary | ICD-10-CM | POA: Diagnosis not present

## 2024-02-05 DIAGNOSIS — G729 Myopathy, unspecified: Secondary | ICD-10-CM | POA: Diagnosis not present

## 2024-02-05 DIAGNOSIS — E103293 Type 1 diabetes mellitus with mild nonproliferative diabetic retinopathy without macular edema, bilateral: Secondary | ICD-10-CM | POA: Diagnosis not present

## 2024-02-05 DIAGNOSIS — F3342 Major depressive disorder, recurrent, in full remission: Secondary | ICD-10-CM | POA: Diagnosis not present

## 2024-02-20 DIAGNOSIS — M65321 Trigger finger, right index finger: Secondary | ICD-10-CM | POA: Diagnosis not present

## 2024-02-20 DIAGNOSIS — M65322 Trigger finger, left index finger: Secondary | ICD-10-CM | POA: Diagnosis not present

## 2024-02-28 DIAGNOSIS — M65322 Trigger finger, left index finger: Secondary | ICD-10-CM | POA: Diagnosis not present

## 2024-03-06 DIAGNOSIS — M67441 Ganglion, right hand: Secondary | ICD-10-CM | POA: Diagnosis not present

## 2024-03-06 DIAGNOSIS — Z4789 Encounter for other orthopedic aftercare: Secondary | ICD-10-CM | POA: Diagnosis not present

## 2024-03-12 DIAGNOSIS — M25642 Stiffness of left hand, not elsewhere classified: Secondary | ICD-10-CM | POA: Diagnosis not present

## 2024-03-18 DIAGNOSIS — Z9641 Presence of insulin pump (external) (internal): Secondary | ICD-10-CM | POA: Diagnosis not present

## 2024-03-18 DIAGNOSIS — E103293 Type 1 diabetes mellitus with mild nonproliferative diabetic retinopathy without macular edema, bilateral: Secondary | ICD-10-CM | POA: Diagnosis not present

## 2024-03-18 DIAGNOSIS — H2513 Age-related nuclear cataract, bilateral: Secondary | ICD-10-CM | POA: Diagnosis not present

## 2024-03-18 DIAGNOSIS — E103393 Type 1 diabetes mellitus with moderate nonproliferative diabetic retinopathy without macular edema, bilateral: Secondary | ICD-10-CM | POA: Diagnosis not present

## 2024-03-18 DIAGNOSIS — H43393 Other vitreous opacities, bilateral: Secondary | ICD-10-CM | POA: Diagnosis not present

## 2024-03-18 DIAGNOSIS — H35033 Hypertensive retinopathy, bilateral: Secondary | ICD-10-CM | POA: Diagnosis not present

## 2024-03-18 DIAGNOSIS — Z794 Long term (current) use of insulin: Secondary | ICD-10-CM | POA: Diagnosis not present

## 2024-05-27 ENCOUNTER — Emergency Department (HOSPITAL_BASED_OUTPATIENT_CLINIC_OR_DEPARTMENT_OTHER)

## 2024-05-27 ENCOUNTER — Emergency Department (HOSPITAL_BASED_OUTPATIENT_CLINIC_OR_DEPARTMENT_OTHER)
Admission: EM | Admit: 2024-05-27 | Discharge: 2024-05-27 | Disposition: A | Discharge status: Home / Self Care | Attending: Emergency Medicine | Admitting: Emergency Medicine

## 2024-05-27 ENCOUNTER — Other Ambulatory Visit: Payer: Self-pay

## 2024-05-27 ENCOUNTER — Encounter (HOSPITAL_BASED_OUTPATIENT_CLINIC_OR_DEPARTMENT_OTHER): Payer: Self-pay | Admitting: Emergency Medicine

## 2024-05-27 ENCOUNTER — Other Ambulatory Visit (HOSPITAL_BASED_OUTPATIENT_CLINIC_OR_DEPARTMENT_OTHER): Payer: Self-pay

## 2024-05-27 DIAGNOSIS — I82A12 Acute embolism and thrombosis of left axillary vein: Secondary | ICD-10-CM | POA: Insufficient documentation

## 2024-05-27 DIAGNOSIS — M7989 Other specified soft tissue disorders: Secondary | ICD-10-CM | POA: Diagnosis present

## 2024-05-27 DIAGNOSIS — I82622 Acute embolism and thrombosis of deep veins of left upper extremity: Secondary | ICD-10-CM

## 2024-05-27 LAB — CBC WITH DIFFERENTIAL/PLATELET
Abs Immature Granulocytes: 0.02 K/uL (ref 0.00–0.07)
Basophils Absolute: 0.1 K/uL (ref 0.0–0.1)
Basophils Relative: 1 %
Eosinophils Absolute: 0.5 K/uL (ref 0.0–0.5)
Eosinophils Relative: 9 %
HCT: 42.8 % (ref 39.0–52.0)
Hemoglobin: 14.3 g/dL (ref 13.0–17.0)
Immature Granulocytes: 0 %
Lymphocytes Relative: 19 %
Lymphs Abs: 1 K/uL (ref 0.7–4.0)
MCH: 29.8 pg (ref 26.0–34.0)
MCHC: 33.4 g/dL (ref 30.0–36.0)
MCV: 89.2 fL (ref 80.0–100.0)
Monocytes Absolute: 0.5 K/uL (ref 0.1–1.0)
Monocytes Relative: 11 %
Neutro Abs: 3.1 K/uL (ref 1.7–7.7)
Neutrophils Relative %: 60 %
Platelets: 220 K/uL (ref 150–400)
RBC: 4.8 MIL/uL (ref 4.22–5.81)
RDW: 13 % (ref 11.5–15.5)
WBC: 5.2 K/uL (ref 4.0–10.5)
nRBC: 0 % (ref 0.0–0.2)

## 2024-05-27 LAB — BASIC METABOLIC PANEL WITH GFR
Anion gap: 10 (ref 5–15)
BUN: 18 mg/dL (ref 8–23)
CO2: 26 mmol/L (ref 22–32)
Calcium: 9.7 mg/dL (ref 8.9–10.3)
Chloride: 102 mmol/L (ref 98–111)
Creatinine, Ser: 0.84 mg/dL (ref 0.61–1.24)
GFR, Estimated: >60 mL/min
Glucose, Bld: 190 mg/dL — ABNORMAL HIGH (ref 70–99)
Potassium: 4.3 mmol/L (ref 3.5–5.1)
Sodium: 138 mmol/L (ref 135–145)

## 2024-05-27 MED ORDER — APIXABAN (ELIQUIS) VTE STARTER PACK (10MG AND 5MG)
ORAL_TABLET | ORAL | 0 refills | Status: AC
  Filled 2024-05-27: qty 74, 28d supply, fill #0

## 2024-05-27 NOTE — ED Provider Notes (Signed)
" °  Physical Exam  BP (!) 148/74   Pulse 61   Temp 98 F (36.7 C) (Oral)   Resp 16   Ht 5' 11 (1.803 m)   Wt 79.4 kg   SpO2 100%   BMI 24.41 kg/m   Physical Exam Vitals and nursing note reviewed.  HENT:     Head: Normocephalic and atraumatic.  Eyes:     Pupils: Pupils are equal, round, and reactive to light.  Cardiovascular:     Rate and Rhythm: Normal rate and regular rhythm.     Pulses: Normal pulses.  Pulmonary:     Effort: Pulmonary effort is normal.     Breath sounds: Normal breath sounds.  Abdominal:     Palpations: Abdomen is soft.     Tenderness: There is no abdominal tenderness.  Musculoskeletal:        General: Swelling present.  Skin:    General: Skin is warm and dry.  Neurological:     Mental Status: He is alert.  Psychiatric:        Mood and Affect: Mood normal.     Procedures  Procedures  ED Course / MDM   Clinical Course as of 05/27/24 1726  Tue May 27, 2024  1723 Ultrasound left upper extremity shows. Positive for deep venous thrombosis in the left axillary vein, which extends proximally into the upstream subclavian vein, where there is occlusive thrombus present.  No shortness of breath.  No renal or history of hepatic impairment.  Patient would be appropriate candidate for outpatient anticoagulation.  Will start on Eliquis  and instruct for close PCP/hematology oncology follow-up.  Patient is in agreement with this plan.  He understands return precautions will be worrisome for significantly increased clot burden, severe pain or shortness of breath and will return to the ED at once   [MP]    Clinical Course User Index [MP] Pamella Ozell LABOR, DO   Medical Decision Making I, Ozell Pamella DO, have assumed care of this patient from the previous provider pending ultrasound left upper extremity given concern for DVT, reevaluation and disposition  Amount and/or Complexity of Data Reviewed Labs: ordered.  Risk Prescription drug  management.          Pamella Ozell LABOR, DO 05/27/24 1726  "

## 2024-05-27 NOTE — ED Notes (Signed)

## 2024-05-27 NOTE — ED Provider Notes (Signed)
 " [place] EMERGENCY DEPARTMENT AT [facility] Provider Note   CSN: [id number] Arrival date & time: 05/27/24  1106     Patient presents with: Arm Swelling   Jose Harvey is a 63 y.o. male.   63 year old male presents with left upper arm swelling times a week.  States that his whole arm is swollen from the mid bicep down to his left hand.  States that symptoms are worse in the morning and better throughout the day.  No associated weakness or paresthesias.  Denies symptoms or tingling to his left hand.  Has not been short of breath.  No prior history of DVT.       Prior to Admission medications  Medication Sig Start Date End Date Taking? Authorizing Provider  ezetimibe  (ZETIA ) 10 MG tablet Take 10 mg by mouth daily. 02/05/24  Yes [provider]  cetirizine (ZYRTEC) 10 MG tablet Take 10 mg by mouth daily.    [provider]  Continuous Blood Gluc Sensor (FREESTYLE LIBRE 14 DAY SENSOR) MISC AS DIRECTED CHANGE SENSOR EVERY 14 DAYS 28 09/26/19   [provider]  Fenbendazole POWD by Does not apply route.    [provider]  FIASP  100 UNIT/ML SOLN Inject into the skin.    [provider]  Insulin  Human (INSULIN  PUMP) SOLN Inject into the skin continuous. NOVOLOG  100 UNIT/ML VIAL    [provider]  Multiple Vitamins-Minerals (ICAPS AREDS 2) CAPS Take 1 capsule by mouth daily.    [provider]  NOVOLOG  100 UNIT/ML injection Averages about 30 units per day via insulin  pump 08/09/19   [provider]  pantoprazole  (PROTONIX ) 40 MG tablet Take 40 mg by mouth daily.    [provider]  rosuvastatin  (CRESTOR ) 5 MG tablet Take 1 tablet (5 mg total) by mouth daily. 09/12/16   Maranda Leim DEL, MD  sertraline  (ZOLOFT ) 100 MG tablet Take 50 mg by mouth daily. 01/25/16   [provider]  tadalafil (CIALIS) 10 MG tablet Take 10 mg by mouth daily as needed.    [provider]   umeclidinium-vilanterol (ANORO ELLIPTA ) 62.5-25 MCG/ACT AEPB Inhale 1 puff into the lungs daily. 11/01/23   Hunsucker, Donnice SAUNDERS, MD    Allergies: Crestor  Equilla ]    Review of Systems  All other systems reviewed and are negative.   Updated Vital Signs BP (!) 145/87 (BP Location: Right Arm)   Pulse 73   Temp (!) 97.5 F (36.4 C) (Oral)   Resp 16   Ht 1.803 m (5' 11)   Wt 79.4 kg   SpO2 100%   BMI 24.41 kg/m   Physical Exam Vitals and nursing note reviewed.  Constitutional:      General: He is not in acute distress.    Appearance: Normal appearance. He is well-developed. He is not toxic-appearing.  HENT:     Head: Normocephalic and atraumatic.  Eyes:     General: Lids are normal.     Conjunctiva/sclera: Conjunctivae normal.     Pupils: Pupils are equal, round, and reactive to light.  Neck:     Thyroid : No thyroid  mass.     Trachea: No tracheal deviation.  Cardiovascular:     Rate and Rhythm: Normal rate and regular rhythm.     Heart sounds: Normal heart sounds. No murmur heard.    No gallop.  Pulmonary:     Effort: Pulmonary effort is normal. No respiratory distress.     Breath sounds: Normal breath sounds.  No stridor. No decreased breath sounds, wheezing, rhonchi or rales.  Abdominal:     General: There is no distension.     Palpations: Abdomen is soft.     Tenderness: There is no abdominal tenderness. There is no rebound.  Musculoskeletal:        General: No tenderness. Normal range of motion.     Cervical back: Normal range of motion and neck supple.     Comments: Slight edema noted diffusely through left upper extremity.  No erythema or ecchymosis noted.  Brachial and radial pulse on the left are 2+.  Neurovascular status intact at left hand.  Skin:    General: Skin is warm and dry.     Findings: No abrasion or rash.  Neurological:     Mental Status: He is alert and oriented to person, place, and time. Mental status is at baseline.     GCS: GCS eye  subscore is 4. GCS verbal subscore is 5. GCS motor subscore is 6.     Cranial Nerves: No cranial nerve deficit.     Sensory: No sensory deficit.     Motor: Motor function is intact.  Psychiatric:        Attention and Perception: Attention normal.        Speech: Speech normal.        Behavior: Behavior normal.     (all labs ordered are listed, but only abnormal results are displayed) Labs Reviewed  CBC WITH DIFFERENTIAL/PLATELET  BASIC METABOLIC PANEL WITH GFR    EKG: None  Radiology: No results found.   Procedures   Medications Ordered in the ED - No data to display                                  Medical Decision Making Amount and/or Complexity of Data Reviewed Labs: ordered.   Labs here are reassuring at this time.  Doppler of upper extremity is pending at this time.  Patient may need to have CT angiography of his upper extremity.  Signed out to Dr. Pamella     Final diagnoses:  None    ED Discharge Orders     None          Dasie Faden, MD 05/27/24 1524  "

## 2024-05-27 NOTE — Discharge Instructions (Signed)
 You were seen in the emerged permit for left arm discomfort and swelling There appears to be a blood clot in your left arm (DVT) For this reason we have started you on a medication called Eliquis  Please pick up this medication from your pharmacy and take as directed You will need to follow-up with your primary care doctor within the next week for reevaluation and to discuss today's findings Also follow-up with your oncology team Return to the emergency department for severe pain trouble breathing or any other concerns

## 2024-05-27 NOTE — ED Notes (Signed)
 ED Provider at bedside.

## 2024-05-27 NOTE — ED Triage Notes (Addendum)
 Pt caox4 c/o L arm swelling x1 wk. Denies pain. Denies any recent trauma/injury. Denies PMH DVT.

## 2024-05-27 NOTE — ED Notes (Signed)
 [place] tech at bedside

## 2024-06-18 ENCOUNTER — Other Ambulatory Visit: Payer: Self-pay

## 2024-06-18 DIAGNOSIS — C884 Extranodal marginal zone b-cell lymphoma of mucosa-associated lymphoid tissue (malt-lymphoma) not having achieved remission: Secondary | ICD-10-CM

## 2024-06-19 ENCOUNTER — Inpatient Hospital Stay

## 2024-06-19 ENCOUNTER — Inpatient Hospital Stay: Attending: Hematology | Admitting: Hematology

## 2024-06-19 VITALS — BP 143/70 | HR 62 | Temp 97.3°F | Resp 20 | Wt 177.3 lb

## 2024-06-19 DIAGNOSIS — Z9049 Acquired absence of other specified parts of digestive tract: Secondary | ICD-10-CM | POA: Insufficient documentation

## 2024-06-19 DIAGNOSIS — C61 Malignant neoplasm of prostate: Secondary | ICD-10-CM

## 2024-06-19 DIAGNOSIS — C884 Extranodal marginal zone b-cell lymphoma of mucosa-associated lymphoid tissue (malt-lymphoma) not having achieved remission: Secondary | ICD-10-CM

## 2024-06-19 DIAGNOSIS — I82A12 Acute embolism and thrombosis of left axillary vein: Secondary | ICD-10-CM | POA: Insufficient documentation

## 2024-06-19 DIAGNOSIS — I82622 Acute embolism and thrombosis of deep veins of left upper extremity: Secondary | ICD-10-CM | POA: Diagnosis not present

## 2024-06-19 DIAGNOSIS — Z9079 Acquired absence of other genital organ(s): Secondary | ICD-10-CM | POA: Insufficient documentation

## 2024-06-19 DIAGNOSIS — Z8249 Family history of ischemic heart disease and other diseases of the circulatory system: Secondary | ICD-10-CM | POA: Insufficient documentation

## 2024-06-19 DIAGNOSIS — Z833 Family history of diabetes mellitus: Secondary | ICD-10-CM | POA: Insufficient documentation

## 2024-06-19 DIAGNOSIS — Z87442 Personal history of urinary calculi: Secondary | ICD-10-CM | POA: Insufficient documentation

## 2024-06-19 DIAGNOSIS — Z86018 Personal history of other benign neoplasm: Secondary | ICD-10-CM | POA: Insufficient documentation

## 2024-06-19 DIAGNOSIS — Z9089 Acquired absence of other organs: Secondary | ICD-10-CM | POA: Insufficient documentation

## 2024-06-19 DIAGNOSIS — Z800 Family history of malignant neoplasm of digestive organs: Secondary | ICD-10-CM | POA: Insufficient documentation

## 2024-06-19 DIAGNOSIS — M6530 Trigger finger, unspecified finger: Secondary | ICD-10-CM | POA: Insufficient documentation

## 2024-06-19 DIAGNOSIS — Z794 Long term (current) use of insulin: Secondary | ICD-10-CM | POA: Insufficient documentation

## 2024-06-19 DIAGNOSIS — Z8589 Personal history of malignant neoplasm of other organs and systems: Secondary | ICD-10-CM | POA: Insufficient documentation

## 2024-06-19 DIAGNOSIS — Z7901 Long term (current) use of anticoagulants: Secondary | ICD-10-CM | POA: Insufficient documentation

## 2024-06-19 DIAGNOSIS — Z87891 Personal history of nicotine dependence: Secondary | ICD-10-CM | POA: Insufficient documentation

## 2024-06-19 DIAGNOSIS — E278 Other specified disorders of adrenal gland: Secondary | ICD-10-CM | POA: Insufficient documentation

## 2024-06-19 DIAGNOSIS — C01 Malignant neoplasm of base of tongue: Secondary | ICD-10-CM | POA: Insufficient documentation

## 2024-06-19 DIAGNOSIS — Z825 Family history of asthma and other chronic lower respiratory diseases: Secondary | ICD-10-CM | POA: Insufficient documentation

## 2024-06-19 DIAGNOSIS — Z79899 Other long term (current) drug therapy: Secondary | ICD-10-CM | POA: Insufficient documentation

## 2024-06-19 DIAGNOSIS — E1039 Type 1 diabetes mellitus with other diabetic ophthalmic complication: Secondary | ICD-10-CM | POA: Insufficient documentation

## 2024-06-19 LAB — CMP (CANCER CENTER ONLY)
ALT: 18 U/L (ref 0–44)
AST: 19 U/L (ref 15–41)
Albumin: 4.3 g/dL (ref 3.5–5.0)
Alkaline Phosphatase: 89 U/L (ref 38–126)
Anion gap: 9 (ref 5–15)
BUN: 26 mg/dL — ABNORMAL HIGH (ref 8–23)
CO2: 28 mmol/L (ref 22–32)
Calcium: 9.4 mg/dL (ref 8.9–10.3)
Chloride: 100 mmol/L (ref 98–111)
Creatinine: 1.04 mg/dL (ref 0.61–1.24)
GFR, Estimated: >60 mL/min
Glucose, Bld: 247 mg/dL — ABNORMAL HIGH (ref 70–99)
Potassium: 4.4 mmol/L (ref 3.5–5.1)
Sodium: 137 mmol/L (ref 135–145)
Total Bilirubin: 0.5 mg/dL (ref 0.0–1.2)
Total Protein: 7.2 g/dL (ref 6.5–8.1)

## 2024-06-19 LAB — LACTATE DEHYDROGENASE: LDH: 172 U/L (ref 105–235)

## 2024-06-19 LAB — CBC WITH DIFFERENTIAL (CANCER CENTER ONLY)
Abs Immature Granulocytes: 0.01 K/uL (ref 0.00–0.07)
Basophils Absolute: 0.1 K/uL (ref 0.0–0.1)
Basophils Relative: 1 %
Eosinophils Absolute: 0.6 K/uL — ABNORMAL HIGH (ref 0.0–0.5)
Eosinophils Relative: 11 %
HCT: 42.1 % (ref 39.0–52.0)
Hemoglobin: 14.4 g/dL (ref 13.0–17.0)
Immature Granulocytes: 0 %
Lymphocytes Relative: 21 %
Lymphs Abs: 1.2 K/uL (ref 0.7–4.0)
MCH: 30.1 pg (ref 26.0–34.0)
MCHC: 34.2 g/dL (ref 30.0–36.0)
MCV: 87.9 fL (ref 80.0–100.0)
Monocytes Absolute: 0.6 K/uL (ref 0.1–1.0)
Monocytes Relative: 10 %
Neutro Abs: 3.2 K/uL (ref 1.7–7.7)
Neutrophils Relative %: 57 %
Platelet Count: 221 K/uL (ref 150–400)
RBC: 4.79 MIL/uL (ref 4.22–5.81)
RDW: 13.2 % (ref 11.5–15.5)
WBC Count: 5.6 K/uL (ref 4.0–10.5)
nRBC: 0 % (ref 0.0–0.2)

## 2024-06-19 LAB — MAGNESIUM: Magnesium: 1.9 mg/dL (ref 1.7–2.4)

## 2024-06-19 LAB — ANTITHROMBIN III: AntiThromb III Func: 110 % (ref 75–120)

## 2024-06-19 NOTE — Progress Notes (Signed)
 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  F/u for Pulmonary extranodal MALT Lymphoma   HISTORY OF PRESENTING ILLNESS: Please see previous note for details on initial presentation.     SUMMARY OF ONCOLOGIC HISTORY: Oncology History  Cancer of base of tongue (HCC)  06/11/2020 Initial Diagnosis   Cancer of base of tongue (HCC)   06/11/2020 Cancer Staging   Staging form: Pharynx - HPV-Mediated Oropharynx, AJCC 8th Edition - Clinical stage from 06/11/2020: Stage I (cT2, cN1, cM0, p16+) - Signed by Izell Domino, MD on 06/11/2020   07/06/2020 - 08/10/2020 Chemotherapy   Patient is on Treatment Plan : ESOPHAGUS Carboplatin /PACLitaxel  weekly x 6 weeks with XRT         INTERVAL HISTORY:  Jose Harvey is a 64 y.o. male who is here today for continued evaluation and management of his head and neck squamous cell carcinoma. He is accompanied by his wife today.  he was last seen by me on 10/31/2023; at the time he did not have any concerns and was doing well.   Today, he came in to discuss edema of the left arm. He first noticed the swelling in the first week of December 2025. He noticed the swelling would improve at night, but would worsen first thing in the morning with movement. He had an u/s venous image to assess DVT. He denies any injuries to this area. His Porto-Cath was placed on his right side for his last treatment. He denies recent IV lines  placed on the left side of his arm.   He had recent surgery on his hands in November 2025. He was under local anesthesia to address trigger finger and to remove cysts. He reports his swelling has gone down since he was placed on blood thinners.   He reports an intermittent cough that lasted several weeks. This cough began in August 2025. He reports complete smoking cessation.   He denies family hx or personal hx of blood clots. His last PSA was about a year ago.   He denies any unexpected weight change, chest pain, abdominal pain, leg swelling, bowel/urinary changes, SOB, and change in breathing   REVIEW OF SYSTEMS:   10 Point review of systems of done and is negative except as noted above.  MEDICAL HISTORY Past Medical History:  Diagnosis Date   Adrenal adenoma, left 03/2017   noted on CT   Allergic rhinitis, cause unspecified    Anxiety state, unspecified    Aortic atherosclerosis 03/2017   noted on CT   BPV (benign positional vertigo)    COPD (chronic obstructive pulmonary disease) with emphysema (HCC)    mild followed by [facility]    Depressive disorder, not elsewhere classified    Dyspnea    history of   ED (erectile dysfunction)    Gallbladder polyp 03/2016   noted on CT   Hepatic hemangioma 03/2017   noted on CT   History of kidney stones    hx of years ago    Indigestion    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)  Mixed hyperlipidemia    Prostate cancer (HCC)    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, not stated as uncontrolled(250.51)    Type I (juvenile type) diabetes mellitus without mention of complication, not stated as uncontrolled    type I - followed by Dr Faythe    Vesicoureteral reflux     SURGICAL HISTORY Past Surgical History:  Procedure Laterality Date   CHOLECYSTECTOMY N/A 05/17/2016   Procedure: LAPAROSCOPIC CHOLECYSTECTOMY;  Surgeon: Mitzie DELENA Freund, MD;  Location: [facility];  Service: General;   Laterality: N/A;   CYST EXCISION Right 08/09/2023   Procedure: RIGHT LONG FINGER EXCISION MUCOID CYSTS;  Surgeon: Murrell Drivers, MD;  Location: [facility];  Service: Orthopedics;  Laterality: Right;  45 MIN   cyst removed from throat      dental surgeries      IR GASTROSTOMY TUBE MOD SED  07/07/2020   IR GASTROSTOMY TUBE REMOVAL  01/21/2021   IR IMAGING GUIDED PORT INSERTION  07/07/2020   IR REMOVAL TUN ACCESS W/ PORT W/O FL MOD SED  01/21/2021   LYMPHADENECTOMY Bilateral 07/02/2017   Procedure: BILATERAL LYMPHADENECTOMY;  Surgeon: Carolee Sherwood JONETTA DOUGLAS, MD;  Location: [facility];  Service: Urology;  Laterality: Bilateral;   PROSTATE BIOPSY     ROBOT ASSISTED LAPAROSCOPIC RADICAL PROSTATECTOMY N/A 07/02/2017   Procedure: XI ROBOTIC ASSISTED LAPAROSCOPIC RADICAL PROSTATECTOMY;  Surgeon: Carolee Sherwood JONETTA DOUGLAS, MD;  Location: [facility];  Service: Urology;  Laterality: N/A;   TENDON EXPLORATION Right 08/09/2023   Procedure: DEBRIDEMENT DISTAL INTERPHALANGEAL JOINT;  Surgeon: Murrell Drivers, MD;  Location: [facility];  Service: Orthopedics;  Laterality: Right;   TONSILLECTOMY     TRIGGER FINGER RELEASE Right 08/09/2023   Procedure: RELEASE TRIGGER FINGER/A-1 PULLEY RIGHT INDEX FINGER;  Surgeon: Murrell Drivers, MD;  Location:  SURGERY CENTER;  Service: Orthopedics;  Laterality: Right;   VIDEO ASSISTED THORACOSCOPY (VATS)/ LOBECTOMY Right 05/08/2019   Procedure: Right VIDEO ASSISTED THORACOSCOPY with   MIDDLE LOBECTOMY and Enbloc portion of Upper Lobe with Node dissection, Intercostal Nerve Block;  Surgeon: Kerrin Elspeth BROCKS, MD;  Location: [facility];  Service: Thoracic;  Laterality: Right;   WISDOM TOOTH EXTRACTION      SOCIAL HISTORY Social History[1]  Social History   Social History Narrative   Patient divorced. Patient has two grown daughters but neither lives locally.    SOCIAL DRIVERS OF HEALTH SDOH Screenings   Food Insecurity: Low Risk (09/24/2023)   Received from [facility]  Housing: Low Risk (09/24/2023)   Received from [facility]  Transportation Needs: No Transportation Needs (09/24/2023)   Received from [facility]  Utilities: Low Risk (09/24/2023)   Received from [facility]  Tobacco Use: Medium Risk (05/27/2024)     FAMILY HISTORY Family History  Problem Relation Age of Onset   Diabetes type I Father    Heart attack Father    Hypertension Father    Emphysema Father    CAD Father    Colon cancer Maternal Grandfather    Emphysema Paternal Grandfather    Diabetes type I Paternal Grandfather    Kidney disease Neg Hx    Liver disease Neg Hx    Breast cancer Neg Hx    Prostate cancer Neg Hx      ALLERGIES: is allergic to crestor  [rosuvastatin ].  MEDICATIONS  Current Outpatient Medications  Medication Sig Dispense Refill   APIXABAN  (ELIQUIS ) VTE STARTER PACK (10MG  AND 5MG ) Take as directed on package: start  with two-5mg  tablets twice daily for 7 days. On day 8, switch to one-5mg  tablet twice daily. 74 each 0   cetirizine (ZYRTEC) 10 MG tablet Take 10 mg by mouth daily.     Continuous Blood Gluc Sensor (FREESTYLE LIBRE 14 DAY SENSOR) MISC AS DIRECTED CHANGE SENSOR EVERY 14 DAYS 28     ezetimibe  (ZETIA ) 10 MG tablet Take 10 mg by mouth daily.     Fenbendazole POWD by Does not apply route.     FIASP  100 UNIT/ML SOLN Inject into the skin.     Insulin  Human (INSULIN  PUMP) SOLN Inject into the skin continuous. NOVOLOG  100 UNIT/ML VIAL     Multiple Vitamins-Minerals (ICAPS AREDS 2) CAPS Take 1 capsule by mouth daily.     NOVOLOG  100 UNIT/ML injection Averages about 30 units per day via insulin  pump     pantoprazole  (PROTONIX ) 40 MG tablet Take 40 mg by mouth daily.     rosuvastatin  (CRESTOR ) 5 MG tablet Take 1 tablet (5 mg total) by mouth daily. 90 tablet 3   sertraline  (ZOLOFT ) 100 MG tablet Take 50 mg by mouth daily.  0   tadalafil (CIALIS) 10 MG tablet Take 10 mg by mouth daily as needed.     umeclidinium-vilanterol (ANORO ELLIPTA )  62.5-25 MCG/ACT AEPB Inhale 1 puff into the lungs daily. 60 each 11   No current facility-administered medications for this visit.    PHYSICAL EXAMINATION: ECOG PERFORMANCE STATUS: 1 - Symptomatic but completely ambulatory VITALS: Vitals:   06/19/24 1108 06/19/24 1117  BP: (!) 150/65 (!) 143/70  Pulse: 62   Resp: 20   Temp: (!) 97.3 F (36.3 C)   SpO2: 98%    Filed Weights   06/19/24 1108  Weight: 177 lb 4.8 oz (80.4 kg)   Body mass index is 24.73 kg/m.  GENERAL: alert, in no acute distress and comfortable SKIN: no acute rashes, no significant lesions EYES: conjunctiva are pink and non-injected, sclera anicteric OROPHARYNX: MMM, no exudates, no oropharyngeal erythema or ulceration NECK: supple, no JVD LYMPH:  no palpable lymphadenopathy in the cervical, axillary or inguinal regions LUNGS: clear to auscultation b/l with normal respiratory effort HEART: regular rate & rhythm ABDOMEN:  normoactive bowel sounds , non tender, not distended, no hepatosplenomegaly Extremity: no pedal edema PSYCH: alert & oriented x 3 with fluent speech NEURO: no focal motor/sensory deficits  LABORATORY DATA:   I have reviewed the data as listed     Latest Ref Rng & Units 06/19/2024   10:11 AM 05/27/2024   12:24 PM 10/31/2023    8:21 AM  CBC EXTENDED  WBC 4.0 - 10.5 K/uL 5.6  5.2  5.2   RBC 4.22 - 5.81 MIL/uL 4.79  4.80  4.69   Hemoglobin 13.0 - 17.0 g/dL 85.5  85.6  85.9   HCT 39.0 - 52.0 % 42.1  42.8  41.7   Platelets 150 - 400 K/uL 221  220  208   NEUT# 1.7 - 7.7 K/uL 3.2  3.1  3.0   Lymph# 0.7 - 4.0 K/uL 1.2  1.0  1.1        Latest Ref Rng & Units 06/19/2024   10:11 AM 05/27/2024   12:24 PM 10/31/2023    8:21 AM  CMP  Glucose 70 - 99 mg/dL 752  809  856   BUN 8 - 23 mg/dL 26  18  19    Creatinine 0.61 - 1.24 mg/dL 8.95  9.15  9.08   Sodium 135 - 145 mmol/L  137  138  139   Potassium 3.5 - 5.1 mmol/L 4.4  4.3  4.8   Chloride 98 - 111 mmol/L 100  102  105   CO2 22 - 32 mmol/L  28  26  29    Calcium  8.9 - 10.3 mg/dL 9.4  9.7  9.4   Total Protein 6.5 - 8.1 g/dL 7.2   7.0   Total Bilirubin 0.0 - 1.2 mg/dL 0.5   0.5   Alkaline Phos 38 - 126 U/L 89   67   AST 15 - 41 U/L 19   17   ALT 0 - 44 U/L 18   17    U/S Venous Img Upper Uni Left 05/27/24   EXAM: US  Duplex left Upper Extremity Veins.   TECHNIQUE: Real-time ultrasound scan of the veins of the left upper extremity with color Doppler flow, spectral waveform analysis and compression.   COMPARISON: None available.   CLINICAL HISTORY: Edema.   FINDINGS:   SUPERFICIAL VEINS: The cephalic and basilic veins are compressible, and demonstrate normal color Doppler flow.   DEEP VEINS: The internal jugular and brachial veins are compressible, and demonstrate normal color Doppler flow. The mid to distal subclavian vein demonstrates no significant vascular flow and is noncompressible distally. The axillary vein is noncompressible and demonstrates minimal vascular flow.   SOFT TISSUES: No acute finding.   IMPRESSION: 1. Positive for deep venous thrombosis in the left axillary vein, which extends proximally into the upstream subclavian vein, where there is occlusive thrombus present.   Electronically signed by: Rogelia Myers MD 05/27/2024 05:07 PM EST RP Workstation: [id number]    05/26/2020 Right Cervical LN Bx ([id number]):    05/08/2019 Surgical Pathology [phone]):     05/08/2019 Surgical Pathology [phone]):     RADIOGRAPHIC STUDIES: I have personally reviewed the radiological images as listed and agreed with the findings in the report. US  Venous Img Upper Uni Left Result Date: 05/27/2024 EXAM: US  Duplex left Upper Extremity Veins. TECHNIQUE: Real-time ultrasound scan of the veins of the left upper extremity with color Doppler flow, spectral waveform analysis and compression. COMPARISON: None available. CLINICAL HISTORY: Edema. FINDINGS: SUPERFICIAL VEINS: The cephalic and  basilic veins are compressible, and demonstrate normal color Doppler flow. DEEP VEINS: The internal jugular and brachial veins are compressible, and demonstrate normal color Doppler flow. The mid to distal subclavian vein demonstrates no significant vascular flow and is noncompressible distally. The axillary vein is noncompressible and demonstrates minimal vascular flow. SOFT TISSUES: No acute finding. IMPRESSION: 1. Positive for deep venous thrombosis in the left axillary vein, which extends proximally into the upstream subclavian vein, where there is occlusive thrombus present. Electronically signed by: Rogelia Myers MD 05/27/2024 05:07 PM EST RP Workstation: [id number]    ASSESSMENT & PLAN:  64 y.o. male with  1) hx of head and neck squamous cell carcinoma of the right base of the tongue metastatic to right level 2 cervical lymph nodes. HPV-Mediated Oropharynx, AJCC 8th Edition - Clinical stage from 06/11/2020: Stage I (cT2, cN1, cM0, p16+)    He has had more than 40-pack-year history of smoking which would serve as an adverse risk factor.  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  -04/08/2019 CT Chest ([id number]) revealed 1. Masslike area of ground-glass straddles the minor fissure with septal thickening and fissural retraction. While the lesion has increased in size minimally from 09/30/2018, there is clear enlargement from baseline examination on 02/16/2016. Therefore, lesion is characterized as worrisome for adenocarcinoma, Lung-RADS 4B, suspicious. -02/25/2016 PET/CT ([id number]) revealed Dominant 8 mm right lower lobe pulmonary nodule shows no significant FDG uptake. Other scattered less than 5 mm bilateral pulmonary nodules also show no FDG uptake but are too small to characterize by PET. Right middle lobe ground-glass opacity shows low-grade FDG uptake, and differential diagnosis includes inflammatory or infectious processes and low-grade adenocarcinoma. Multiple low-attenuation liver masses show no hypermetabolic activity compared to normal hepatic parenchyma. These remain indeterminate and cannot be characterized without IV contrast. Abdomen MRI without and with contrast recommended for further evaluation. 2.5 cm left adrenal mass shows mild FDG uptake, and remains indeterminate. Differential diagnosis includes atypical adenoma, metastasis, pheochromocytoma, or less likely adrenal cortical carcinoma.   3) Hx of Prostate cancer   PLAN: -discussed u/s findings of clot from left axilary area proximally into the upstream subclavian vein, spoke about this clot being a provoked event or unprovoked event  -discussed chance of reoccurance -discussed a work-up with blood tests to search for genetic factors as a cause for the clots -sending PSA labs because he is due for this, prostate cancer is stable  -will order CT scan wo contrast to make sure there isn't a mechanical obstruction to blood flow -unprovoked blood clots constitute the use of consistent blood thinners, long-term  -sending in a refill for  Eliquis  -discussed compression garmets for edema in the lt arm -recommended drinking more water  -labs today and will order CT scan for the next 1-2 weeks at [facility] Additional labs today CT venogram chest w/contrast in 1 week RTC with Dr Onesimo in 3 weeks  . Orders Placed This Encounter  Procedures   CT VENOGRAM CHEST    Standing Status:   Future    Number of Occurrences:   1    Expected Date:   06/20/2024    Expiration Date:   06/19/2025    If indicated for the ordered procedure, I authorize the administration of contrast media per Radiology protocol:   Yes    Does the patient have a contrast media/X-ray dye allergy?:   No    Preferred imaging location?:   [facility]   Antithrombin III     Standing Status:   Future    Number of Occurrences:   1    Expected Date:   06/19/2024    Expiration Date:   06/19/2025   Protein C activity    Standing Status:   Future    Number of Occurrences:   1    Expected Date:   06/19/2024    Expiration Date:   06/19/2025   Protein C, total    Standing Status:  Future    Number of Occurrences:   1    Expected Date:   06/19/2024    Expiration Date:   06/19/2025   Protein S activity    Standing Status:   Future    Number of Occurrences:   1    Expected Date:   06/19/2024    Expiration Date:   06/19/2025   Protein S, total    Standing Status:   Future    Number of Occurrences:   1    Expected Date:   06/19/2024    Expiration Date:   06/19/2025   Lupus anticoagulant panel    Standing Status:   Future    Number of Occurrences:   1    Expected Date:   06/19/2024    Expiration Date:   06/19/2025   Beta-2 -glycoprotein i abs, IgG/M/A    Standing Status:   Future    Number of Occurrences:   1    Expected Date:   06/19/2024    Expiration Date:   06/19/2025   Factor 5 leiden    Standing Status:   Future    Number of Occurrences:   1    Expected Date:   06/19/2024    Expiration Date:   06/19/2025   Prothrombin gene mutation    Standing Status:    Future    Number of Occurrences:   1    Expected Date:   06/19/2024    Expiration Date:   06/19/2025   Cardiolipin antibodies, IgG, IgM, IgA    Standing Status:   Future    Number of Occurrences:   1    Expected Date:   06/19/2024    Expiration Date:   06/19/2025   PSA, total and free    Standing Status:   Future    Number of Occurrences:   1    Expected Date:   06/19/2024    Expiration Date:   06/19/2025    FOLLOW-UP in 3 weeks for labs and follow-up with Dr. Onesimo.  The total time spent in the appointment was 40 minutes* .  All of the patient's questions were answered and the patient knows to call the clinic with any problems, questions, or concerns.  Emaline Onesimo MD MS AAHIVMS [facility] Hematology/Oncology Physician [facility]  *Total Encounter Time as defined by the Centers for Medicare and Medicaid Services includes, in addition to the face-to-face time of a patient visit (documented in the note above) non-face-to-face time: obtaining and reviewing outside history, ordering and reviewing medications, tests or procedures, care coordination (communications with other health care professionals or caregivers) and documentation in the medical record.  I, Alan Blowers, acting as a neurosurgeon for Emaline Onesimo, MD.,have documented all relevant documentation on the behalf of Emaline Onesimo, MD,as directed by  Emaline Onesimo, MD while in the presence of Emaline Onesimo, MD.  I have reviewed the above documentation for accuracy and completeness, and I agree with the above.  Emaline Onesimo, MD     [1]  Social History Tobacco Use   Smoking status: Former    Current packs/day: 0.00    Average packs/day: 1.5 packs/day for 27.0 years (40.5 ttl pk-yrs)    Types: Cigarettes    Start date: 06/05/1981    Quit date: 06/05/2008    Years since quitting: 16.0   Smokeless tobacco: Former    Types: Snuff    Quit date: 2015  Vaping Use   Vaping status: Never Used  Substance  Use Topics   Alcohol use: Yes     Alcohol/week: 6.0 standard drinks of alcohol    Types: 6 Cans of beer per week    Comment: occasional 6pk per week   Drug use: No   "

## 2024-06-20 ENCOUNTER — Telehealth: Payer: Self-pay | Admitting: Hematology

## 2024-06-20 LAB — LUPUS ANTICOAGULANT PANEL
DRVVT: 46.6 s (ref 0.0–47.0)
PTT Lupus Anticoagulant: 34.1 s (ref 0.0–43.5)

## 2024-06-20 LAB — PSA, TOTAL AND FREE
PSA, Free Pct: UNDETERMINED %
PSA, Free: <0.02 ng/mL
Prostate Specific Ag, Serum: <0.1 ng/mL (ref 0.0–4.0)

## 2024-06-20 LAB — PROTEIN S ACTIVITY: Protein S Activity: 111 % (ref 63–140)

## 2024-06-20 LAB — PROTEIN C ACTIVITY: Protein C Activity: 147 % (ref 73–180)

## 2024-06-20 LAB — PROTEIN S, TOTAL: Protein S Ag, Total: 96 % (ref 60–150)

## 2024-06-20 NOTE — Telephone Encounter (Signed)
 Scheduled patient for next appointment. Called and spoke with the patient about the day and time, he is aware.

## 2024-06-21 LAB — BETA-2-GLYCOPROTEIN I ABS, IGG/M/A
Beta-2 Glyco I IgG: <9 GPI IgG units (ref 0–20)
Beta-2-Glycoprotein I IgA: <9 GPI IgA units (ref 0–25)
Beta-2-Glycoprotein I IgM: <9 GPI IgM units (ref 0–32)

## 2024-06-21 LAB — PROTEIN C, TOTAL: Protein C, Total: 96 % (ref 60–150)

## 2024-06-22 LAB — CARDIOLIPIN ANTIBODIES, IGG, IGM, IGA
Anticardiolipin IgA: <9 U/mL (ref 0–11)
Anticardiolipin IgG: <9 GPL U/mL (ref 0–14)
Anticardiolipin IgM: <9 [MPL'U]/mL (ref 0–12)

## 2024-06-23 LAB — FACTOR 5 LEIDEN

## 2024-06-24 ENCOUNTER — Ambulatory Visit (HOSPITAL_BASED_OUTPATIENT_CLINIC_OR_DEPARTMENT_OTHER)
Admission: RE | Admit: 2024-06-24 | Discharge: 2024-06-24 | Disposition: A | Discharge status: Home / Self Care | Source: Ambulatory Visit | Attending: Hematology | Admitting: Hematology

## 2024-06-24 ENCOUNTER — Other Ambulatory Visit: Payer: Self-pay

## 2024-06-24 DIAGNOSIS — I82622 Acute embolism and thrombosis of deep veins of left upper extremity: Secondary | ICD-10-CM | POA: Diagnosis present

## 2024-06-24 LAB — PROTHROMBIN GENE MUTATION

## 2024-06-24 MED ORDER — IOHEXOL 350 MG/ML SOLN
100.0000 mL | Freq: Once | INTRAVENOUS | Status: AC | PRN
  Administered 2024-06-24: 100 mL via INTRAVENOUS

## 2024-06-24 MED ORDER — APIXABAN 5 MG PO TABS: 5.0000 mg | ORAL_TABLET | Freq: Two times a day (BID) | ORAL | 3 refills | Status: AC

## 2024-07-15 ENCOUNTER — Inpatient Hospital Stay: Admitting: Hematology

## 2024-10-21 ENCOUNTER — Inpatient Hospital Stay

## 2024-10-21 ENCOUNTER — Inpatient Hospital Stay: Admitting: Hematology
